# Patient Record
Sex: Female | Born: 1955
Health system: Southern US, Community
[De-identification: ages and names within clinical notes are randomized; demographics above are authoritative.]

## PROBLEM LIST (undated history)

## (undated) DIAGNOSIS — I83813 Varicose veins of bilateral lower extremities with pain: Secondary | ICD-10-CM

## (undated) DIAGNOSIS — C801 Malignant (primary) neoplasm, unspecified: Secondary | ICD-10-CM

## (undated) DIAGNOSIS — L4050 Arthropathic psoriasis, unspecified: Secondary | ICD-10-CM

## (undated) DIAGNOSIS — E7800 Pure hypercholesterolemia, unspecified: Secondary | ICD-10-CM

## (undated) DIAGNOSIS — Z87898 Personal history of other specified conditions: Secondary | ICD-10-CM

## (undated) DIAGNOSIS — M797 Fibromyalgia: Secondary | ICD-10-CM

## (undated) DIAGNOSIS — R42 Dizziness and giddiness: Secondary | ICD-10-CM

## (undated) DIAGNOSIS — M810 Age-related osteoporosis without current pathological fracture: Secondary | ICD-10-CM

## (undated) DIAGNOSIS — T7840XA Allergy, unspecified, initial encounter: Secondary | ICD-10-CM

## (undated) DIAGNOSIS — K219 Gastro-esophageal reflux disease without esophagitis: Secondary | ICD-10-CM

## (undated) DIAGNOSIS — K5790 Diverticulosis of intestine, part unspecified, without perforation or abscess without bleeding: Secondary | ICD-10-CM

## (undated) DIAGNOSIS — K589 Irritable bowel syndrome without diarrhea: Secondary | ICD-10-CM

## (undated) DIAGNOSIS — I10 Essential (primary) hypertension: Secondary | ICD-10-CM

## (undated) DIAGNOSIS — F419 Anxiety disorder, unspecified: Secondary | ICD-10-CM

## (undated) DIAGNOSIS — M1990 Unspecified osteoarthritis, unspecified site: Secondary | ICD-10-CM

## (undated) DIAGNOSIS — E559 Vitamin D deficiency, unspecified: Secondary | ICD-10-CM

## (undated) DIAGNOSIS — J189 Pneumonia, unspecified organism: Secondary | ICD-10-CM

## (undated) DIAGNOSIS — E785 Hyperlipidemia, unspecified: Secondary | ICD-10-CM

## (undated) DIAGNOSIS — J40 Bronchitis, not specified as acute or chronic: Secondary | ICD-10-CM

## (undated) DIAGNOSIS — I872 Venous insufficiency (chronic) (peripheral): Secondary | ICD-10-CM

## (undated) HISTORY — DX: Allergy, unspecified, initial encounter: T78.40XA

## (undated) HISTORY — DX: Fibromyalgia: M79.7

## (undated) HISTORY — PX: VARICOSE VEIN SURGERY: SHX832

## (undated) HISTORY — PX: DILATION AND CURETTAGE OF UTERUS: SHX78

## (undated) HISTORY — DX: Unspecified osteoarthritis, unspecified site: M19.90

## (undated) HISTORY — PX: JOINT REPLACEMENT: SHX530

## (undated) HISTORY — DX: Malignant (primary) neoplasm, unspecified: C80.1

## (undated) HISTORY — PX: COLONOSCOPY: SHX174

---

## 1982-06-27 HISTORY — PX: TUBAL LIGATION: SHX77

## 1994-06-27 HISTORY — PX: ABDOMINAL HYSTERECTOMY: SHX81

## 2004-04-26 ENCOUNTER — Ambulatory Visit: Payer: Self-pay | Admitting: Unknown Physician Specialty

## 2008-04-17 ENCOUNTER — Ambulatory Visit: Payer: Self-pay | Admitting: Endocrinology

## 2008-06-05 ENCOUNTER — Encounter: Payer: Self-pay | Admitting: Family

## 2008-06-27 ENCOUNTER — Encounter: Payer: Self-pay | Admitting: Family

## 2009-06-27 HISTORY — PX: BREAST BIOPSY: SHX20

## 2009-08-05 ENCOUNTER — Ambulatory Visit: Payer: Self-pay | Admitting: Unknown Physician Specialty

## 2010-01-08 ENCOUNTER — Ambulatory Visit: Payer: Self-pay | Admitting: General Surgery

## 2010-03-18 ENCOUNTER — Ambulatory Visit: Payer: Self-pay | Admitting: General Surgery

## 2010-06-27 HISTORY — PX: HERNIA REPAIR: SHX51

## 2010-06-27 HISTORY — PX: UMBILICAL HERNIA REPAIR: SHX196

## 2010-08-30 ENCOUNTER — Ambulatory Visit: Payer: Self-pay | Admitting: General Surgery

## 2010-09-08 ENCOUNTER — Ambulatory Visit: Payer: Self-pay | Admitting: Unknown Physician Specialty

## 2011-04-11 ENCOUNTER — Ambulatory Visit: Payer: Self-pay | Admitting: General Surgery

## 2011-10-26 ENCOUNTER — Ambulatory Visit: Payer: Self-pay | Admitting: Unknown Physician Specialty

## 2013-01-10 ENCOUNTER — Encounter: Payer: Self-pay | Admitting: *Deleted

## 2014-04-28 ENCOUNTER — Encounter: Payer: Self-pay | Admitting: *Deleted

## 2014-07-25 ENCOUNTER — Ambulatory Visit: Payer: Self-pay | Admitting: Adult Health

## 2014-10-07 ENCOUNTER — Emergency Department
Admit: 2014-10-07 | Disposition: A | Discharge status: Home / Self Care | Payer: Self-pay | Admitting: Emergency Medicine

## 2014-10-07 LAB — COMPREHENSIVE METABOLIC PANEL
ALBUMIN: 4.4 g/dL
ALK PHOS: 78 U/L
Anion Gap: 8 (ref 7–16)
BUN: 18 mg/dL
Bilirubin,Total: 0.5 mg/dL
CALCIUM: 9 mg/dL
Chloride: 99 mmol/L — ABNORMAL LOW
Co2: 28 mmol/L
Creatinine: 0.86 mg/dL
EGFR (African American): >60
EGFR (Non-African Amer.): >60
Glucose: 94 mg/dL
POTASSIUM: 3.7 mmol/L
SGOT(AST): 24 U/L
SGPT (ALT): 20 U/L
SODIUM: 135 mmol/L
TOTAL PROTEIN: 8.1 g/dL

## 2014-10-07 LAB — CBC
HCT: 39.6 % (ref 35.0–47.0)
HGB: 13.5 g/dL (ref 12.0–16.0)
MCH: 29.6 pg (ref 26.0–34.0)
MCHC: 34 g/dL (ref 32.0–36.0)
MCV: 87 fL (ref 80–100)
Platelet: 387 10*3/uL (ref 150–440)
RBC: 4.55 10*6/uL (ref 3.80–5.20)
RDW: 13.2 % (ref 11.5–14.5)
WBC: 8 10*3/uL (ref 3.6–11.0)

## 2014-10-07 LAB — TROPONIN I: Troponin-I: <0.03 ng/mL

## 2015-01-07 ENCOUNTER — Emergency Department
Admission: EM | Admit: 2015-01-07 | Discharge: 2015-01-07 | Disposition: A | Discharge status: Home / Self Care | Payer: 59 | Attending: Emergency Medicine | Admitting: Emergency Medicine

## 2015-01-07 ENCOUNTER — Emergency Department: Payer: 59

## 2015-01-07 ENCOUNTER — Encounter: Payer: Self-pay | Admitting: *Deleted

## 2015-01-07 DIAGNOSIS — R05 Cough: Secondary | ICD-10-CM | POA: Diagnosis present

## 2015-01-07 DIAGNOSIS — J209 Acute bronchitis, unspecified: Secondary | ICD-10-CM | POA: Diagnosis not present

## 2015-01-07 DIAGNOSIS — J40 Bronchitis, not specified as acute or chronic: Secondary | ICD-10-CM

## 2015-01-07 MED ORDER — IPRATROPIUM-ALBUTEROL 0.5-2.5 (3) MG/3ML IN SOLN
3.0000 mL | Freq: Once | RESPIRATORY_TRACT | Status: AC
  Administered 2015-01-07: 3 mL via RESPIRATORY_TRACT
  Filled 2015-01-07: qty 3

## 2015-01-07 MED ORDER — ALBUTEROL SULFATE HFA 108 (90 BASE) MCG/ACT IN AERS: 2.0000 | INHALATION_SPRAY | Freq: Four times a day (QID) | RESPIRATORY_TRACT | Status: DC | PRN

## 2015-01-07 MED ORDER — AZITHROMYCIN 250 MG PO TABS: ORAL_TABLET | ORAL | Status: AC

## 2015-01-07 MED ORDER — PREDNISONE 50 MG PO TABS: ORAL_TABLET | ORAL | Status: DC

## 2015-01-07 MED ORDER — PREDNISONE 20 MG PO TABS
60.0000 mg | ORAL_TABLET | Freq: Once | ORAL | Status: AC
  Administered 2015-01-07: 60 mg via ORAL
  Filled 2015-01-07: qty 3

## 2015-01-07 MED ORDER — HYDROCOD POLST-CPM POLST ER 10-8 MG/5ML PO SUER: 5.0000 mL | Freq: Two times a day (BID) | ORAL | Status: DC

## 2015-01-07 MED ORDER — HYDROCOD POLST-CPM POLST ER 10-8 MG/5ML PO SUER
5.0000 mL | Freq: Once | ORAL | Status: AC
  Administered 2015-01-07: 5 mL via ORAL

## 2015-01-07 MED ORDER — HYDROCOD POLST-CPM POLST ER 10-8 MG/5ML PO SUER
ORAL | Status: AC
  Filled 2015-01-07: qty 5

## 2015-01-07 MED ORDER — AZITHROMYCIN 250 MG PO TABS
500.0000 mg | ORAL_TABLET | Freq: Once | ORAL | Status: AC
  Administered 2015-01-07: 500 mg via ORAL
  Filled 2015-01-07: qty 2

## 2015-01-07 NOTE — Discharge Instructions (Signed)

## 2015-01-07 NOTE — ED Notes (Signed)
Pt c/o cough, throat irritation and feels like she cannot catch her breath for 1 week

## 2015-01-07 NOTE — ED Provider Notes (Signed)
[facility] Emergency Department Provider Note     Time seen: ----------------------------------------- 7:33 PM on 01/07/2015 -----------------------------------------    I have reviewed the triage vital signs and the nursing notes.   HISTORY  Chief Complaint Cough    HPI Anna Mccarthy is a 59 y.o. female who presents ER with cough throat irritation for over one week. Patient states she feels that she can't catch her breath she's had persistent coughing which is not improving. Denies fevers chills or other complaints.   Past Medical History  Diagnosis Date  . Allergy   . Fibromyalgia   . Arthritis   . Cancer     skin    There are no active problems to display for this patient.   Past Surgical History  Procedure Laterality Date  . Tubal ligation  1984  . Abdominal hysterectomy  1996  . Hernia repair  2012  . Colonoscopy      Allergies Percocet and Sulfa antibiotics  Social History History  Substance Use Topics  . Smoking status: Never Smoker   . Smokeless tobacco: Never Used  . Alcohol Use: Yes    Review of Systems Constitutional: Negative for fever. Eyes: Negative for visual changes. ENT: Positive for throat irritation Cardiovascular: Negative for chest pain. Respiratory: Positive shortness of breath and cough Gastrointestinal: Negative for abdominal pain, vomiting and diarrhea. Genitourinary: Negative for dysuria. Musculoskeletal: Negative for back pain. Skin: Negative for rash. Neurological: Negative for headaches, focal weakness or numbness.  10-point ROS otherwise negative.  ____________________________________________   PHYSICAL EXAM:  VITAL SIGNS: ED Triage Vitals  Enc Vitals Group     BP 01/07/15 1927 153/103 mmHg     Pulse Rate 01/07/15 1927 89     Resp 01/07/15 1927 18     Temp 01/07/15 1927 98.3 F (36.8 C)     Temp Source 01/07/15 1927 Oral     SpO2 01/07/15 1927 100 %     Weight 01/07/15 1927 170  lb (77.111 kg)     Height 01/07/15 1927 5\' 2"  (1.575 m)     Head Cir --      Peak Flow --      Pain Score --      Pain Loc --      Pain Edu? --      Excl. in [place]? --     Constitutional: Alert and oriented. Well appearing and in no distress. Eyes: Conjunctivae are normal. PERRL. Normal extraocular movements. ENT   Head: Normocephalic and atraumatic.   Nose: No congestion/rhinnorhea.   Mouth/Throat: Mucous membranes are moist.   Neck: No stridor. Hematological/Lymphatic/Immunilogical: No cervical lymphadenopathy. Cardiovascular: Normal rate, regular rhythm. Normal and symmetric distal pulses are present in all extremities. No murmurs, rubs, or gallops. Respiratory: Normal respiratory effort without tachypnea nor retractions. Breath sounds are clear and equal bilaterally. No wheezes/rales/rhonchi. Gastrointestinal: Soft and nontender. No distention. No abdominal bruits. There is no CVA tenderness. Musculoskeletal: Nontender with normal range of motion in all extremities. No joint effusions.  No lower extremity tenderness nor edema. Neurologic:  Normal speech and language. No gross focal neurologic deficits are appreciated. Speech is normal. No gait instability. Skin:  Skin is warm, dry and intact. No rash noted. Psychiatric: Mood and affect are normal. Speech and behavior are normal. Patient exhibits appropriate insight and judgment.  ____________________________________________  ED COURSE:  Pertinent labs & imaging results that were available during my care of the patient were reviewed by me and considered in my medical  decision making (see chart for details). Patient given DuoNeb, prednisone and azithromycin. ____________________________________________   RADIOLOGY Images were viewed by me  Chest x-ray was performed FINDINGS: The heart size and mediastinal contours are within normal limits. Both lungs are clear. The visualized skeletal structures  are unremarkable.  IMPRESSION: No active disease. ____________________________________________  FINAL ASSESSMENT AND PLAN  Bronchitis  Plan: Patient with imaging as dictated above. We'll continue home with reading treatments, prednisone, Tussionex and finish a Z-Pak. She is advised to return for worsening or worrisome symptoms. Earleen Newport, MD   Earleen Newport, MD 01/07/15 2022

## 2015-01-08 NOTE — ED Notes (Signed)
Call received from [facility] Pharmacist to clarify prescription, information given from discharge instructions. MG

## 2015-02-21 ENCOUNTER — Telehealth: Payer: 59 | Admitting: Family

## 2015-02-21 DIAGNOSIS — R05 Cough: Secondary | ICD-10-CM | POA: Diagnosis not present

## 2015-02-21 DIAGNOSIS — R059 Cough, unspecified: Secondary | ICD-10-CM

## 2015-02-21 DIAGNOSIS — J209 Acute bronchitis, unspecified: Secondary | ICD-10-CM

## 2015-02-21 MED ORDER — BENZONATATE 100 MG PO CAPS
100.0000 mg | ORAL_CAPSULE | Freq: Three times a day (TID) | ORAL | Status: DC | PRN
Start: 2015-02-21 — End: 2015-05-24

## 2015-02-21 MED ORDER — AZITHROMYCIN 250 MG PO TABS: ORAL_TABLET | ORAL | Status: DC

## 2015-02-21 NOTE — Progress Notes (Signed)

## 2015-05-24 ENCOUNTER — Telehealth: Payer: 59 | Admitting: Physician Assistant

## 2015-05-24 DIAGNOSIS — R05 Cough: Secondary | ICD-10-CM | POA: Diagnosis not present

## 2015-05-24 DIAGNOSIS — J209 Acute bronchitis, unspecified: Secondary | ICD-10-CM

## 2015-05-24 DIAGNOSIS — R059 Cough, unspecified: Secondary | ICD-10-CM

## 2015-05-24 MED ORDER — BENZONATATE 100 MG PO CAPS: 100.0000 mg | ORAL_CAPSULE | Freq: Three times a day (TID) | ORAL | Status: DC | PRN

## 2015-05-24 MED ORDER — AZITHROMYCIN 250 MG PO TABS: ORAL_TABLET | ORAL | Status: DC

## 2015-05-24 NOTE — Progress Notes (Signed)

## 2015-07-17 ENCOUNTER — Encounter: Payer: Self-pay | Admitting: Physician Assistant

## 2015-07-17 ENCOUNTER — Ambulatory Visit: Payer: Self-pay | Admitting: Physician Assistant

## 2015-07-17 VITALS — BP 140/80 | HR 72 | Temp 98.3°F

## 2015-07-17 DIAGNOSIS — M25562 Pain in left knee: Secondary | ICD-10-CM

## 2015-07-17 DIAGNOSIS — J069 Acute upper respiratory infection, unspecified: Secondary | ICD-10-CM

## 2015-07-17 MED ORDER — CEFDINIR 300 MG PO CAPS: 300.0000 mg | ORAL_CAPSULE | Freq: Two times a day (BID) | ORAL | Status: DC

## 2015-07-17 MED ORDER — DICLOFENAC SODIUM 1 % TD GEL: 4.0000 g | Freq: Four times a day (QID) | TRANSDERMAL | Status: DC

## 2015-07-17 MED ORDER — METHYLPREDNISOLONE 4 MG PO TBPK: ORAL_TABLET | ORAL | Status: DC

## 2015-07-17 NOTE — Progress Notes (Signed)
S: pt has 2 complaints, 1. Left knee pain, had cortisone injection in November, got a little better but now knee is getting sore again, no known injury, some popping/grinding, increased pain when bears weight, hx of ?rheumatoid vs osteo arthritis, states she has a lot of inflammation in her body 2. C/o cough and congestion with yellow mucus, no fever, chills, cp/sob; sx for a week, taking otc mucinex, tussin and cough drops  O: vitals wnl, nad, tms dull, nasal mucosa red and swollen, throat injected, neck supple no lymph, lungs c t a, cv rrr, left knee tender at medial aspect, some grinding with extension, full rom, n/v intact  A: acute upper respiratory, acute left knee pain  P: omnicef 300mg  bid, medrol dose pack, voltaren gel, f/u with pcp/ortho

## 2015-08-13 DIAGNOSIS — F419 Anxiety disorder, unspecified: Secondary | ICD-10-CM | POA: Diagnosis not present

## 2015-08-13 DIAGNOSIS — M25562 Pain in left knee: Secondary | ICD-10-CM | POA: Diagnosis not present

## 2015-08-13 DIAGNOSIS — M797 Fibromyalgia: Secondary | ICD-10-CM | POA: Diagnosis not present

## 2015-08-13 DIAGNOSIS — M2550 Pain in unspecified joint: Secondary | ICD-10-CM | POA: Diagnosis not present

## 2015-08-13 DIAGNOSIS — M7989 Other specified soft tissue disorders: Secondary | ICD-10-CM | POA: Diagnosis not present

## 2015-10-22 DIAGNOSIS — Z79899 Other long term (current) drug therapy: Secondary | ICD-10-CM | POA: Diagnosis not present

## 2015-10-22 DIAGNOSIS — M797 Fibromyalgia: Secondary | ICD-10-CM | POA: Diagnosis not present

## 2015-10-29 DIAGNOSIS — Z79899 Other long term (current) drug therapy: Secondary | ICD-10-CM | POA: Diagnosis not present

## 2015-10-29 DIAGNOSIS — M797 Fibromyalgia: Secondary | ICD-10-CM | POA: Diagnosis not present

## 2015-11-25 DIAGNOSIS — M25562 Pain in left knee: Secondary | ICD-10-CM | POA: Diagnosis not present

## 2015-11-25 DIAGNOSIS — Z86010 Personal history of colonic polyps: Secondary | ICD-10-CM | POA: Diagnosis not present

## 2015-11-25 DIAGNOSIS — G8929 Other chronic pain: Secondary | ICD-10-CM | POA: Diagnosis not present

## 2015-12-09 ENCOUNTER — Other Ambulatory Visit: Payer: Self-pay | Admitting: Orthopedic Surgery

## 2015-12-09 DIAGNOSIS — M23312 Other meniscus derangements, anterior horn of medial meniscus, left knee: Secondary | ICD-10-CM

## 2015-12-09 DIAGNOSIS — M25562 Pain in left knee: Secondary | ICD-10-CM | POA: Diagnosis not present

## 2015-12-09 DIAGNOSIS — G8929 Other chronic pain: Secondary | ICD-10-CM | POA: Diagnosis not present

## 2015-12-24 DIAGNOSIS — Z9229 Personal history of other drug therapy: Secondary | ICD-10-CM | POA: Diagnosis not present

## 2015-12-24 DIAGNOSIS — M25562 Pain in left knee: Secondary | ICD-10-CM | POA: Diagnosis not present

## 2016-01-05 ENCOUNTER — Ambulatory Visit
Admission: RE | Admit: 2016-01-05 | Discharge: 2016-01-05 | Disposition: A | Discharge status: Home / Self Care | Payer: 59 | Source: Ambulatory Visit | Attending: Orthopedic Surgery | Admitting: Orthopedic Surgery

## 2016-01-05 DIAGNOSIS — M25462 Effusion, left knee: Secondary | ICD-10-CM | POA: Diagnosis not present

## 2016-01-05 DIAGNOSIS — S83242A Other tear of medial meniscus, current injury, left knee, initial encounter: Secondary | ICD-10-CM | POA: Diagnosis not present

## 2016-01-05 DIAGNOSIS — M25461 Effusion, right knee: Secondary | ICD-10-CM | POA: Insufficient documentation

## 2016-01-05 DIAGNOSIS — M659 Synovitis and tenosynovitis, unspecified: Secondary | ICD-10-CM | POA: Diagnosis not present

## 2016-01-05 DIAGNOSIS — X58XXXA Exposure to other specified factors, initial encounter: Secondary | ICD-10-CM | POA: Insufficient documentation

## 2016-01-05 DIAGNOSIS — M23312 Other meniscus derangements, anterior horn of medial meniscus, left knee: Secondary | ICD-10-CM | POA: Diagnosis present

## 2016-01-08 ENCOUNTER — Ambulatory Visit: Payer: 59

## 2016-01-20 DIAGNOSIS — S83232D Complex tear of medial meniscus, current injury, left knee, subsequent encounter: Secondary | ICD-10-CM | POA: Diagnosis not present

## 2016-02-08 ENCOUNTER — Other Ambulatory Visit: Payer: 59

## 2016-02-11 ENCOUNTER — Encounter
Admission: RE | Admit: 2016-02-11 | Discharge: 2016-02-11 | Disposition: A | Discharge status: Home / Self Care | Payer: 59 | Source: Ambulatory Visit | Attending: Orthopedic Surgery | Admitting: Orthopedic Surgery

## 2016-02-11 DIAGNOSIS — I10 Essential (primary) hypertension: Secondary | ICD-10-CM | POA: Diagnosis not present

## 2016-02-11 DIAGNOSIS — Z01810 Encounter for preprocedural cardiovascular examination: Secondary | ICD-10-CM | POA: Diagnosis not present

## 2016-02-11 DIAGNOSIS — R194 Change in bowel habit: Secondary | ICD-10-CM | POA: Diagnosis not present

## 2016-02-11 DIAGNOSIS — R197 Diarrhea, unspecified: Secondary | ICD-10-CM | POA: Diagnosis not present

## 2016-02-11 HISTORY — DX: Bronchitis, not specified as acute or chronic: J40

## 2016-02-11 HISTORY — DX: Gastro-esophageal reflux disease without esophagitis: K21.9

## 2016-02-11 HISTORY — DX: Essential (primary) hypertension: I10

## 2016-02-11 HISTORY — DX: Anxiety disorder, unspecified: F41.9

## 2016-02-11 NOTE — Patient Instructions (Signed)
  Your procedure is scheduled on:02/18/16 Thurs Report to Same Day Surgery 2nd floor medical mall To find out your arrival time please call [phone] between [place] on 02/16/16 Wed  Remember: Instructions that are not followed completely may result in serious medical risk, up to and including death, or upon the discretion of your surgeon and anesthesiologist your surgery may need to be rescheduled.    _x___ 1. Do not eat food or drink liquids after midnight. No gum chewing or hard candies.     __x__ 2. No Alcohol for 24 hours before or after surgery.   __x__3. No Smoking for 24 prior to surgery.   ____  4. Bring all medications with you on the day of surgery if instructed.    __x__ 5. Notify your doctor if there is any change in your medical condition     (cold, fever, infections).     Do not wear jewelry, make-up, hairpins, clips or nail polish.  Do not wear lotions, powders, or perfumes. You may wear deodorant.  Do not shave 48 hours prior to surgery. Men may shave face and neck.  Do not bring valuables to the hospital.    [facility] is not responsible for any belongings or valuables.               Contacts, dentures or bridgework may not be worn into surgery.  Leave your suitcase in the car. After surgery it may be brought to your room.  For patients admitted to the hospital, discharge time is determined by your treatment team.   Patients discharged the day of surgery will not be allowed to drive home.    Please read over the following fact sheets that you were given:   [facility] Preparing for Surgery and or MRSA Information   _x___ Take these medicines the morning of surgery with A SIP OF WATER:    1. omeprazole (PRILOSEC) 40 MG capsule  2.albuterol (PROVENTIL HFA;VENTOLIN HFA) 108 (90 BASE) MCG/ACT inhaler bring to hospital with you  3.  4.  5.  6.  ____ Fleet Enema (as directed)   _x___ Use CHG Soap or sage wipes as directed on instruction sheet   ____ Use  inhalers on the day of surgery and bring to hospital day of surgery  ____ Stop metformin 2 days prior to surgery    ____ Take 1/2 of usual insulin dose the night before surgery and none on the morning of           surgery.   ____ Stop aspirin or coumadin, or plavix  _x__ Stop Anti-inflammatories such as Advil, Aleve, Ibuprofen, Motrin, Naproxen,          Naprosyn, Goodies powders or aspirin products. Ok to take Tylenol.   _x___ Stop supplements until after surgery.  Stop fish oils and vitamin E today  ____ Bring C-Pap to the hospital.

## 2016-02-11 NOTE — Pre-Procedure Instructions (Signed)
Narrative   CARDIOLOGY DEPARTMENT Anna Mccarthy, Anna Mccarthy #: [id number] [address] [name], [place] [address] Date: 10/28/2014 09:35 AM  Adult Female Age: 60 yrs  ECHOCARDIOGRAM REPORT Outpatient STUDY:Stress EchoTAPE: KC::KCWC  ECHO:Yes DOPPLER:YesFILE:[id number] MD1: COLOR:YesCONTRAST:NoMACHINE:PhilipsHeight: 78 in RV BIOPSY:No 3D:No SOUND QLTY:Moderate Weight: 173 lb  MEDIUM:None  BSA: 1.8 m2 ___________________________________________________________________________________________  HISTORY:Chest pain REASON:Assess, LV function Indication:R07.9 Chest pain with low risk for cardiac etiology, R06.02 SOB (shortness  of breath) on exertion  ___________________________________________________________________________________________ STRESS ECHOCARDIOGRAPHY   Protocol:Treadmill (Bruce) Drugs:None Target Heart Rate:136 bpmMaximum Predicted Heart Rate: 160 bpm  +-------------------+-------------------------+-------------------------+------------+  Stage  Duration (mm:ss) Heart Rate (bpm) BP  +-------------------+-------------------------+-------------------------+------------+ RESTING 73  136/88  +-------------------+-------------------------+-------------------------+------------+ EXERCISE  9:00  144  / +-------------------+-------------------------+-------------------------+------------+ RECOVERY  6:2290  195/98  +-------------------+-------------------------+-------------------------+------------+  Stress Duration:9:00 mm:ss Max Stress H.R.:144 bpmTarget Heart Rate Achieved: Yes   ___________________________________________________________________________________________ WALL SEGMENT CHANGES  RestStress Anterior Septum Basal:NormalHyperkinetic [id number]  Apical:NormalHyperkinetic  Anterior Wall Basal:NormalHyperkinetic [id number]  Apical:NormalHyperkinetic   Lateral Wall Basal:NormalHyperkinetic [id number]  Apical:NormalHyperkinetic   Posterior Wall Basal:NormalHyperkinetic [id number]  Inferior Wall Basal:NormalHyperkinetic [id number]  Apical:NormalHyperkinetic  Inferior Septum Basal:NormalHyperkinetic [id number]   Resting EF:>55% (Est.) Stress EF: >55% (Est.)   ___________________________________________________________________________________________ ADDITIONAL FINDINGS  Chest Discomfort:None Arrhythmia:None Termination Reason:Fatigue  Adverse Effects:None  Complications:None   ___________________________________________________________________________________________ STRESS ECG RESULTS   ECG  Results:Normal  ___________________________________________________________________________________________  ECHOCARDIOGRAPHIC DESCRIPTIONS  LEFT VENTRICLE Size:Normal  Contraction:Normal  LV Masses:No Masses  [id number] Dias.FxClass:Normal  RIGHT VENTRICLE Size:Normal Free Wall:Normal  Contraction:Normal RV Masses:No mass  PERICARDIUM  Fluid:No effusion  _______________________________________________________________________________________  DOPPLER ECHO and OTHER SPECIAL PROCEDURES  Aortic:No ARNo AS   Mitral:No MRNo MS MV Inflow E Vel=nm*MV Annulus E'Vel=nm* E/E'Ratio=nm*  Tricuspid:TRIVIAL TR No TS  Pulmonary:TRIVIAL PR No PS   ___________________________________________________________________________________________  ECHOCARDIOGRAPHIC MEASUREMENTS 2D DIMENSIONS AORTA ValuesNormal RangeMAIN PAValuesNormal Range Annulus:nm* [2.1 - 2.5]PA Main:nm* [1.5 - 2.1] Aorta Sin:nm* [2.7 - 3.3] RIGHT VENTRICLE ST Junction:nm* [2.3 - 2.9]RV Base:nm* [ < 4.2] Asc.Aorta:nm* [2.3 - 3.1] RV Mid:nm* [ < 3.5]  LEFT VENTRICLERV Length:nm* [ < 8.6] LVIDd:nm* [3.9 - 5.3] INFERIOR VENA CAVA LVIDs:nm* Max. IVC:nm* [ <= 2.1]  FS:nm* [> 25]Min. IVC:nm* SWT:nm* [0.5 - 0.9] ------------------ PWT:nm* [0.5 - 0.9] nm* - not measured  LEFT ATRIUM LA Diam:nm* [2.7 - 3.8] LA A4C Area:nm* [ <  20] LA Volume:nm* [22 - [id number]  ___________________________________________________________________________________________ INTERPRETATION Normal Stress Echocardiogram NORMAL RIGHT VENTRICULAR SYSTOLIC FUNCTION TRIVIAL REGURGITATION NOTED (See above) NO VALVULAR STENOSIS NOTED   ___________________________________________________________________________________________  Electronically signed by: MD Miquel Dunn on 10/28/2014 02:27 PM Performed By: Johnathan Hausen, RDCS, RVT Ordering Physician: Isaias Cowman ___________________________________________________________________________________________  Status Results Details

## 2016-02-16 NOTE — Pre-Procedure Instructions (Signed)
EKG/REQUEST FOR DR PARASCHOS TO CLEAR AS INSTRUCTED BY DR [place] TO DR Bergan [facility]. LM FOR CINDY

## 2016-02-16 NOTE — Pre-Procedure Instructions (Signed)
EKG sent to Anesthesia for review. 

## 2016-02-18 ENCOUNTER — Encounter: Payer: Self-pay | Admitting: *Deleted

## 2016-02-18 ENCOUNTER — Ambulatory Visit: Payer: 59 | Admitting: Certified Registered"

## 2016-02-18 ENCOUNTER — Ambulatory Visit
Admission: RE | Admit: 2016-02-18 | Discharge: 2016-02-18 | Disposition: A | Discharge status: Home / Self Care | Payer: 59 | Source: Ambulatory Visit | Attending: Orthopedic Surgery | Admitting: Orthopedic Surgery

## 2016-02-18 ENCOUNTER — Encounter
Admission: RE | Disposition: A | Discharge status: Home / Self Care | Payer: Self-pay | Source: Ambulatory Visit | Attending: Orthopedic Surgery

## 2016-02-18 DIAGNOSIS — E669 Obesity, unspecified: Secondary | ICD-10-CM | POA: Diagnosis not present

## 2016-02-18 DIAGNOSIS — M797 Fibromyalgia: Secondary | ICD-10-CM | POA: Insufficient documentation

## 2016-02-18 DIAGNOSIS — K589 Irritable bowel syndrome without diarrhea: Secondary | ICD-10-CM | POA: Insufficient documentation

## 2016-02-18 DIAGNOSIS — M810 Age-related osteoporosis without current pathological fracture: Secondary | ICD-10-CM | POA: Diagnosis not present

## 2016-02-18 DIAGNOSIS — Z8261 Family history of arthritis: Secondary | ICD-10-CM | POA: Insufficient documentation

## 2016-02-18 DIAGNOSIS — Z809 Family history of malignant neoplasm, unspecified: Secondary | ICD-10-CM | POA: Insufficient documentation

## 2016-02-18 DIAGNOSIS — Z840 Family history of diseases of the skin and subcutaneous tissue: Secondary | ICD-10-CM | POA: Diagnosis not present

## 2016-02-18 DIAGNOSIS — F419 Anxiety disorder, unspecified: Secondary | ICD-10-CM | POA: Insufficient documentation

## 2016-02-18 DIAGNOSIS — L408 Other psoriasis: Secondary | ICD-10-CM | POA: Diagnosis not present

## 2016-02-18 DIAGNOSIS — S83242A Other tear of medial meniscus, current injury, left knee, initial encounter: Secondary | ICD-10-CM | POA: Diagnosis present

## 2016-02-18 DIAGNOSIS — Z885 Allergy status to narcotic agent status: Secondary | ICD-10-CM | POA: Insufficient documentation

## 2016-02-18 DIAGNOSIS — Z90710 Acquired absence of both cervix and uterus: Secondary | ICD-10-CM | POA: Diagnosis not present

## 2016-02-18 DIAGNOSIS — M23312 Other meniscus derangements, anterior horn of medial meniscus, left knee: Secondary | ICD-10-CM | POA: Diagnosis not present

## 2016-02-18 DIAGNOSIS — M1990 Unspecified osteoarthritis, unspecified site: Secondary | ICD-10-CM | POA: Diagnosis not present

## 2016-02-18 DIAGNOSIS — Z882 Allergy status to sulfonamides status: Secondary | ICD-10-CM | POA: Insufficient documentation

## 2016-02-18 DIAGNOSIS — K219 Gastro-esophageal reflux disease without esophagitis: Secondary | ICD-10-CM | POA: Insufficient documentation

## 2016-02-18 DIAGNOSIS — Z8249 Family history of ischemic heart disease and other diseases of the circulatory system: Secondary | ICD-10-CM | POA: Insufficient documentation

## 2016-02-18 DIAGNOSIS — X58XXXD Exposure to other specified factors, subsequent encounter: Secondary | ICD-10-CM | POA: Insufficient documentation

## 2016-02-18 DIAGNOSIS — S83232D Complex tear of medial meniscus, current injury, left knee, subsequent encounter: Secondary | ICD-10-CM | POA: Diagnosis not present

## 2016-02-18 DIAGNOSIS — M2550 Pain in unspecified joint: Secondary | ICD-10-CM | POA: Insufficient documentation

## 2016-02-18 DIAGNOSIS — I10 Essential (primary) hypertension: Secondary | ICD-10-CM | POA: Insufficient documentation

## 2016-02-18 HISTORY — PX: KNEE ARTHROSCOPY WITH MEDIAL MENISECTOMY: SHX5651

## 2016-02-18 SURGERY — ARTHROSCOPY, KNEE, WITH MEDIAL MENISCECTOMY
Anesthesia: General | Site: Knee | Laterality: Left | Wound class: Clean

## 2016-02-18 MED ORDER — PROPOFOL 10 MG/ML IV BOLUS
INTRAVENOUS | Status: DC | PRN
  Administered 2016-02-18: 150 mg via INTRAVENOUS

## 2016-02-18 MED ORDER — ONDANSETRON HCL 4 MG/2ML IJ SOLN
INTRAMUSCULAR | Status: DC | PRN
  Administered 2016-02-18: 4 mg via INTRAVENOUS

## 2016-02-18 MED ORDER — PROMETHAZINE HCL 25 MG/ML IJ SOLN: 6.2500 mg | INTRAMUSCULAR | Status: DC | PRN

## 2016-02-18 MED ORDER — METOCLOPRAMIDE HCL 5 MG/ML IJ SOLN: 5.0000 mg | Freq: Three times a day (TID) | INTRAMUSCULAR | Status: DC | PRN

## 2016-02-18 MED ORDER — BUPIVACAINE-EPINEPHRINE (PF) 0.5% -1:200000 IJ SOLN
INTRAMUSCULAR | Status: AC
  Filled 2016-02-18: qty 30

## 2016-02-18 MED ORDER — BUPIVACAINE-EPINEPHRINE (PF) 0.5% -1:200000 IJ SOLN
INTRAMUSCULAR | Status: DC | PRN
Start: 2016-02-18 — End: 2016-02-18
  Administered 2016-02-18: 20 mL

## 2016-02-18 MED ORDER — HYDROCODONE-ACETAMINOPHEN 5-325 MG PO TABS: 1.0000 | ORAL_TABLET | ORAL | Status: DC | PRN

## 2016-02-18 MED ORDER — DEXAMETHASONE SODIUM PHOSPHATE 10 MG/ML IJ SOLN
INTRAMUSCULAR | Status: DC | PRN
  Administered 2016-02-18: 4 mg via INTRAVENOUS

## 2016-02-18 MED ORDER — HYDROCODONE-ACETAMINOPHEN 5-325 MG PO TABS: 1.0000 | ORAL_TABLET | Freq: Four times a day (QID) | ORAL | 0 refills | Status: DC | PRN

## 2016-02-18 MED ORDER — ONDANSETRON HCL 4 MG PO TABS: 4.0000 mg | ORAL_TABLET | Freq: Four times a day (QID) | ORAL | Status: DC | PRN

## 2016-02-18 MED ORDER — EPHEDRINE SULFATE 50 MG/ML IJ SOLN
INTRAMUSCULAR | Status: DC | PRN
  Administered 2016-02-18: 5 mg via INTRAVENOUS

## 2016-02-18 MED ORDER — LIDOCAINE HCL (CARDIAC) 20 MG/ML IV SOLN
INTRAVENOUS | Status: DC | PRN
  Administered 2016-02-18: 60 mg via INTRAVENOUS

## 2016-02-18 MED ORDER — FENTANYL CITRATE (PF) 100 MCG/2ML IJ SOLN
INTRAMUSCULAR | Status: DC | PRN
  Administered 2016-02-18 (×2): 50 ug via INTRAVENOUS

## 2016-02-18 MED ORDER — FENTANYL CITRATE (PF) 100 MCG/2ML IJ SOLN
INTRAMUSCULAR | Status: AC
  Filled 2016-02-18: qty 2

## 2016-02-18 MED ORDER — MEPERIDINE HCL 25 MG/ML IJ SOLN: 6.2500 mg | INTRAMUSCULAR | Status: DC | PRN

## 2016-02-18 MED ORDER — SODIUM CHLORIDE 0.9 % IV SOLN: INTRAVENOUS | Status: DC

## 2016-02-18 MED ORDER — METOCLOPRAMIDE HCL 10 MG PO TABS: 5.0000 mg | ORAL_TABLET | Freq: Three times a day (TID) | ORAL | Status: DC | PRN

## 2016-02-18 MED ORDER — FENTANYL CITRATE (PF) 100 MCG/2ML IJ SOLN
25.0000 ug | INTRAMUSCULAR | Status: DC | PRN
  Administered 2016-02-18 (×4): 25 ug via INTRAVENOUS
  Administered 2016-02-18: 50 ug via INTRAVENOUS

## 2016-02-18 MED ORDER — ONDANSETRON HCL 4 MG/2ML IJ SOLN: 4.0000 mg | Freq: Four times a day (QID) | INTRAMUSCULAR | Status: DC | PRN

## 2016-02-18 MED ORDER — FENTANYL CITRATE (PF) 100 MCG/2ML IJ SOLN
INTRAMUSCULAR | Status: AC
  Administered 2016-02-18: 25 ug via INTRAVENOUS
  Filled 2016-02-18: qty 2

## 2016-02-18 MED ORDER — LACTATED RINGERS IV SOLN
INTRAVENOUS | Status: DC
  Administered 2016-02-18: 10:00:00 via INTRAVENOUS

## 2016-02-18 MED ORDER — FAMOTIDINE 20 MG PO TABS
ORAL_TABLET | ORAL | Status: AC
  Filled 2016-02-18: qty 1

## 2016-02-18 MED ORDER — FAMOTIDINE 20 MG PO TABS
20.0000 mg | ORAL_TABLET | Freq: Once | ORAL | Status: AC
  Administered 2016-02-18: 20 mg via ORAL

## 2016-02-18 SURGICAL SUPPLY — 28 items
BANDAGE ACE 4X5 VEL STRL LF (GAUZE/BANDAGES/DRESSINGS) ×2 IMPLANT
BANDAGE ELASTIC 4 LF NS (GAUZE/BANDAGES/DRESSINGS) ×2 IMPLANT
BLADE FULL RADIUS 3.5 (BLADE) IMPLANT
BLADE INCISOR PLUS 4.5 (BLADE) IMPLANT
BLADE SHAVER 4.5 DBL SERAT CV (CUTTER) IMPLANT
BLADE SHAVER 4.5X7 STR FR (MISCELLANEOUS) IMPLANT
CHLORAPREP W/TINT 26ML (MISCELLANEOUS) ×2 IMPLANT
CUFF TOURN 24 STER (MISCELLANEOUS) IMPLANT
CUFF TOURN 30 STER DUAL PORT (MISCELLANEOUS) ×2 IMPLANT
DRAPE C-ARM XRAY 36X54 (DRAPES) ×2 IMPLANT
GAUZE SPONGE 4X4 12PLY STRL (GAUZE/BANDAGES/DRESSINGS) ×2 IMPLANT
GLOVE SURG ORTHO 9.0 STRL STRW (GLOVE) ×2 IMPLANT
GOWN SRG 2XL LVL 4 RGLN SLV (GOWNS) ×1 IMPLANT
GOWN STRL NON-REIN 2XL LVL4 (GOWNS) ×1
GOWN STRL REUS W/ TWL LRG LVL3 (GOWN DISPOSABLE) ×1 IMPLANT
GOWN STRL REUS W/TWL LRG LVL3 (GOWN DISPOSABLE) ×1
IV LACTATED RINGER IRRG 3000ML (IV SOLUTION) ×2
IV LR IRRIG 3000ML ARTHROMATIC (IV SOLUTION) ×2 IMPLANT
KIT RM TURNOVER STRD PROC AR (KITS) ×2 IMPLANT
MANIFOLD NEPTUNE II (INSTRUMENTS) ×2 IMPLANT
PACK ARTHROSCOPY KNEE (MISCELLANEOUS) ×2 IMPLANT
SET TUBE SUCT SHAVER OUTFL 24K (TUBING) ×2 IMPLANT
SET TUBE TIP INTRA-ARTICULAR (MISCELLANEOUS) ×2 IMPLANT
SUT ETHILON 4-0 (SUTURE) ×1
SUT ETHILON 4-0 FS2 18XMFL BLK (SUTURE) ×1
SUTURE ETHLN 4-0 FS2 18XMF BLK (SUTURE) ×1 IMPLANT
TUBING ARTHRO INFLOW-ONLY STRL (TUBING) ×2 IMPLANT
WAND HAND CNTRL MULTIVAC 50 (MISCELLANEOUS) ×2 IMPLANT

## 2016-02-18 NOTE — OR Nursing (Signed)
Left foot warm to touch cap refill < 3 secs patient able to move toes.

## 2016-02-18 NOTE — H&P (Signed)
Reviewed paper H+P, will be scanned into chart. No changes noted.  

## 2016-02-18 NOTE — OR Nursing (Signed)
Patient states pain is tolerable and desires to go home.  Patient state that she can take norco with no issues.

## 2016-02-18 NOTE — Anesthesia Procedure Notes (Signed)
Procedure Name: LMA Insertion Performed by: Skipper Dacosta Pre-anesthesia Checklist: Patient identified, Patient being monitored, Timeout performed, Emergency Drugs available and Suction available Patient Re-evaluated:Patient Re-evaluated prior to inductionOxygen Delivery Method: Circle system utilized Preoxygenation: Pre-oxygenation with 100% oxygen Intubation Type: IV induction Ventilation: Mask ventilation without difficulty LMA: LMA inserted LMA Size: 4.0 Tube type: Oral Number of attempts: 1 Placement Confirmation: positive ETCO2 and breath sounds checked- equal and bilateral Tube secured with: Tape Dental Injury: Teeth and Oropharynx as per pre-operative assessment        

## 2016-02-18 NOTE — Op Note (Signed)
02/18/2016  11:17 AM  PATIENT:  Anna Mccarthy  60 y.o. female  PRE-OPERATIVE DIAGNOSIS:  COMPLEX TEAR OF MEDIAL MENISCUS OF LEFT KNEE AS CURRENT INJURY SUBSEQUENT ENCOUNTER  POST-OPERATIVE DIAGNOSIS:  COMPLEX TEAR OF MEDIAL MENISCUS OF LEFT KNEE AS CURRENT INJURY SUBSEQUENT ENCOUNTER  PROCEDURE:  Procedure(s): KNEE ARTHROSCOPY WITH PARTIAL MEDIAL MENISECTOMY (Left)  SURGEON: Laurene Footman, MD  ASSISTANTS: None  ANESTHESIA:   general  EBL:  Total I/O In: 500 [I.V.:500] Out: 10 [Blood:10]  BLOOD ADMINISTERED:none  DRAINS: none   LOCAL MEDICATIONS USED:  MARCAINE     SPECIMEN:  No Specimen  DISPOSITION OF SPECIMEN:  N/A  COUNTS:  YES  TOURNIQUET:    IMPLANTS: None  DICTATION: .Dragon Dictation patient brought the operating room and after adequate anesthesia was obtained the left leg is prepped draped in sterile fashion with a retrobulbar leg holder and tourniquet applied. After patient identification and timeout procedures were completed, an inferior lateral portal was made. Initial inspection revealed moderate degenerative changes on the patella and trochlea with central arthritis present moderate synovitis present in the suprapatellar pouch along the medial gutter. Coming around medially and inferior medial portal was made and there was areas of near full-thickness cartilage loss to the medial femoral condyle and but without bone exposed the posterior horn of the meniscus had a complex tear with vertical and horizontal components which was debrided back to a stable margin resecting most the posterior third. A shaver and ArthriCare wand were additionally use as well as meniscal punch to debride this tear. The anterior cruciate ligament was intact and lateral compartment was normal. [place] was used to debride some of the synovitis anteriorly and along the gutters for better visualization. After thorough irrigation of the joint argentation was withdrawn and the wounds closed with  simple interrupted 4-0 nylon skin suture. 10 cc of half percent Sensorcaine was injected into each of the portals for postop analgesia. Xeroform 4 x 4 web roll and Ace wrap applied  PLAN OF CARE: Discharge to home after PACU  PATIENT DISPOSITION:  PACU - hemodynamically stable.

## 2016-02-18 NOTE — Anesthesia Preprocedure Evaluation (Signed)
Anesthesia Evaluation  Patient identified by MRN, date of birth, ID band Patient awake    Reviewed: Allergy & Precautions, NPO status , Patient's Chart, lab work & pertinent test results  History of Anesthesia Complications Negative for: history of anesthetic complications  Airway Mallampati: II  TM Distance: >3 FB Neck ROM: Full    Dental no notable dental hx.    Pulmonary neg sleep apnea, neg COPD,  Uses inhaler only with URIs   breath sounds clear to auscultation- rhonchi (-) wheezing      Cardiovascular Exercise Tolerance: Good hypertension, Pt. on medications (-) CAD and (-) Past MI  Rhythm:Regular Rate:Normal - Systolic murmurs and - Diastolic murmurs    Neuro/Psych Anxiety negative neurological ROS     GI/Hepatic Neg liver ROS, GERD  ,  Endo/Other  negative endocrine ROSneg diabetes  Renal/GU negative Renal ROS     Musculoskeletal  (+) Arthritis , Fibromyalgia -  Abdominal (+) + obese,   Peds  Hematology negative hematology ROS (+)   Anesthesia Other Findings Past Medical History: No date: Allergy No date: Anxiety No date: Arthritis No date: Bronchitis No date: Cancer ([place])     Comment: skin No date: Fibromyalgia No date: GERD (gastroesophageal reflux disease) No date: Hypertension   Reproductive/Obstetrics                             Anesthesia Physical Anesthesia Plan  ASA: II  Anesthesia Plan: General   Post-op Pain Management:    Induction: Intravenous  Airway Management Planned: LMA  Additional Equipment:   Intra-op Plan:   Post-operative Plan:   Informed Consent: I have reviewed the patients History and Physical, chart, labs and discussed the procedure including the risks, benefits and alternatives for the proposed anesthesia with the patient or authorized representative who has indicated his/her understanding and acceptance.   Dental advisory  given  Plan Discussed with: CRNA and Anesthesiologist  Anesthesia Plan Comments:         Anesthesia Quick Evaluation

## 2016-02-18 NOTE — Transfer of Care (Signed)
Immediate Anesthesia Transfer of Care Note  Patient: Anna Mccarthy  Procedure(s) Performed: Procedure(s): KNEE ARTHROSCOPY WITH PARTIAL MEDIAL MENISECTOMY (Left)  Patient Location: PACU  Anesthesia Type:General  Level of Consciousness: awake and responds to stimulation  Airway & Oxygen Therapy: Patient Spontanous Breathing and Patient connected to face mask oxygen  Post-op Assessment: Report given to RN and Post -op Vital signs reviewed and stable  Post vital signs: Reviewed and stable  Last Vitals:  Vitals:   02/18/16 0938 02/18/16 1118  BP: 131/84 (!) 165/85  Pulse: 65 92  Resp: 16 16  Temp: 36.8 C 36.2 C    Last Pain:  Vitals:   02/18/16 0938  TempSrc: Oral  PainSc: 6          Complications: No apparent anesthesia complications

## 2016-02-18 NOTE — Discharge Instructions (Signed)
Keep dressing clean and dry. The dressing slides down the leg remove entire bandage put 2 Band-Aids on and rewrap Ace wrap only. Aspirin 81 mg or 325 mg daily until walking normally. Weightbearing as tolerated on left leg but minimize activities as weekend   AMBULATORY SURGERY  DISCHARGE INSTRUCTIONS   1) The drugs that you were given will stay in your system until tomorrow so for the next 24 hours you should not:  A) Drive an automobile B) Make any legal decisions C) Drink any alcoholic beverage   2) You may resume regular meals tomorrow.  Today it is better to start with liquids and gradually work up to solid foods.  You may eat anything you prefer, but it is better to start with liquids, then soup and crackers, and gradually work up to solid foods.   3) Please notify your doctor immediately if you have any unusual bleeding, trouble breathing, redness and pain at the surgery site, drainage, fever, or pain not relieved by medication.    4) Additional Instructions:        Please contact your physician with any problems or Same Day Surgery at [phone], Monday through Friday 6 am to 4 pm, or [place] at [facility] number at [phone].

## 2016-02-18 NOTE — Anesthesia Postprocedure Evaluation (Signed)
Anesthesia Post Note  Patient: Anna Mccarthy  Procedure(s) Performed: Procedure(s) (LRB): KNEE ARTHROSCOPY WITH PARTIAL MEDIAL MENISECTOMY (Left)  Patient location during evaluation: PACU Anesthesia Type: General Level of consciousness: awake and alert and oriented Pain management: pain level controlled Vital Signs Assessment: post-procedure vital signs reviewed and stable Respiratory status: spontaneous breathing, nonlabored ventilation and respiratory function stable Cardiovascular status: blood pressure returned to baseline and stable Postop Assessment: no signs of nausea or vomiting Anesthetic complications: no    Last Vitals:  Vitals:   02/18/16 1200 02/18/16 1212  BP: (!) 156/92 (!) 162/77  Pulse: 71 66  Resp: 14 16  Temp: 36.9 C (!) 35.7 C    Last Pain:  Vitals:   02/18/16 1212  TempSrc: Tympanic  PainSc: 5                  Leman Martinek

## 2016-03-21 DIAGNOSIS — M1712 Unilateral primary osteoarthritis, left knee: Secondary | ICD-10-CM | POA: Diagnosis not present

## 2016-05-16 ENCOUNTER — Ambulatory Visit (INDEPENDENT_AMBULATORY_CARE_PROVIDER_SITE_OTHER): Payer: 59 | Admitting: Vascular Surgery

## 2016-05-16 DIAGNOSIS — I872 Venous insufficiency (chronic) (peripheral): Secondary | ICD-10-CM | POA: Diagnosis not present

## 2016-05-16 DIAGNOSIS — M7989 Other specified soft tissue disorders: Secondary | ICD-10-CM | POA: Diagnosis not present

## 2016-05-16 DIAGNOSIS — M1712 Unilateral primary osteoarthritis, left knee: Secondary | ICD-10-CM

## 2016-05-16 DIAGNOSIS — M79605 Pain in left leg: Secondary | ICD-10-CM

## 2016-05-16 DIAGNOSIS — M79604 Pain in right leg: Secondary | ICD-10-CM

## 2016-05-16 DIAGNOSIS — I83813 Varicose veins of bilateral lower extremities with pain: Secondary | ICD-10-CM | POA: Insufficient documentation

## 2016-05-16 DIAGNOSIS — M1990 Unspecified osteoarthritis, unspecified site: Secondary | ICD-10-CM | POA: Insufficient documentation

## 2016-05-16 DIAGNOSIS — M79609 Pain in unspecified limb: Secondary | ICD-10-CM | POA: Insufficient documentation

## 2016-05-16 NOTE — Progress Notes (Signed)
MRN : [id number]  Anna Mccarthy is a 60 y.o. (1956-01-30) female who presents with chief complaint of  Chief Complaint  Patient presents with  . New Evaluation    Varicose Veins  .  History of Present Illness: The patient is seen for evaluation of symptomatic varicose veins. The patient relates burning and stinging which worsened steadily throughout the course of the day, particularly with standing. The patient also notes an aching and throbbing pain over the varicosities, particularly with prolonged dependent positions. The symptoms are significantly improved with elevation.  The patient also notes that during hot weather the symptoms are greatly intensified. The patient states the pain from the varicose veins interferes with work, daily exercise, shopping and household maintenance. At this point, the symptoms are persistent and severe enough that they're having a negative impact on lifestyle and are interfering with daily activities.  There is no history of DVT, PE or superficial thrombophlebitis. There is no history of ulceration or hemorrhage. The patient denies a significant family history of varicose veins.  The patient has not worn graduated compression in the past. At the present time the patient has not been using over-the-counter analgesics. There is no history of prior surgical intervention or sclerotherapy.    Current Meds  Medication Sig  . albuterol (PROVENTIL HFA;VENTOLIN HFA) 108 (90 BASE) MCG/ACT inhaler Inhale 2 puffs into the lungs every 6 (six) hours as needed for wheezing or shortness of breath.  . ALPRAZolam (XANAX) 0.5 MG tablet Take 0.5 mg by mouth at bedtime.   Marland Kitchen b complex vitamins tablet Take 1 tablet by mouth daily.  . celecoxib (CELEBREX) 100 MG capsule Take 100 mg by mouth daily.   Marland Kitchen escitalopram (LEXAPRO) 20 MG tablet Take 20 mg by mouth at bedtime.   . hydrochlorothiazide (HYDRODIURIL) 25 MG tablet Take 25 mg by mouth daily.  Marland Kitchen HYDROcodone-acetaminophen  (NORCO) 5-325 MG tablet Take 1 tablet by mouth every 6 (six) hours as needed for moderate pain.  . Omega-3 Fatty Acids (FISH OIL PO) Take by mouth.  . Omega-3 Fatty Acids (FISH OIL) 1000 MG CAPS Take 1,000 mg by mouth at bedtime.   Marland Kitchen omeprazole (PRILOSEC) 40 MG capsule Take 40 mg by mouth daily.  . traMADol (ULTRAM) 50 MG tablet Take by mouth.  . Turmeric 1053 MG TABS Take by mouth.  . vitamin E 100 UNIT capsule Take 100 Units by mouth at bedtime.     Past Medical History:  Diagnosis Date  . Allergy   . Anxiety   . Arthritis   . Bronchitis   . Cancer ([place])    skin  . Fibromyalgia   . GERD (gastroesophageal reflux disease)   . Hypertension     Past Surgical History:  Procedure Laterality Date  . ABDOMINAL HYSTERECTOMY  1996  . COLONOSCOPY    . HERNIA REPAIR  2012  . KNEE ARTHROSCOPY WITH MEDIAL MENISECTOMY Left 02/18/2016   Procedure: KNEE ARTHROSCOPY WITH PARTIAL MEDIAL MENISECTOMY;  Surgeon: Hessie Knows, MD;  Location: [facility] ORS;  Service: Orthopedics;  Laterality: Left;  . TUBAL LIGATION  1984    Social History Social History  Substance Use Topics  . Smoking status: Never Smoker  . Smokeless tobacco: Never Used  . Alcohol use 1.2 oz/week    2 Glasses of wine per week    Family History Family History  Problem Relation Age of Onset  . Breast cancer Maternal Aunt   . Ovarian cancer Sister   . Breast cancer  Cousin   No family history of bleeding/clotting disorders, porphyria or autoimmune disease   Allergies  Allergen Reactions  . Percocet [Oxycodone-Acetaminophen] Itching  . Sulfa Antibiotics     Gi problems      REVIEW OF SYSTEMS (Negative unless checked)  Constitutional: [] Weight loss  [] Fever  [] Chills Cardiac: [] Chest pain   [] Chest pressure   [] Palpitations   [] Shortness of breath when laying flat   [] Shortness of breath with exertion. Vascular:  [x] Pain in legs with walking   [] Pain in legs at rest  [] History of DVT   [] Phlebitis   [x] Swelling in  legs   [x] Varicose veins   [] Non-healing ulcers Pulmonary:   [] Uses home oxygen   [] Productive cough   [] Hemoptysis   [] Wheeze  [] COPD   [] Asthma Neurologic:  [] Dizziness   [] Seizures   [] History of stroke   [] History of TIA  [] Aphasia   [] Vissual changes   [] Weakness or numbness in arm   [] Weakness or numbness in leg Musculoskeletal:   [] Joint swelling   [] Joint pain   [] Low back pain Hematologic:  [] Easy bruising  [] Easy bleeding   [] Hypercoagulable state   [] Anemic Gastrointestinal:  [] Diarrhea   [] Vomiting  [] Gastroesophageal reflux/heartburn   [] Difficulty swallowing. Genitourinary:  [] Chronic kidney disease   [] Difficult urination  [] Frequent urination   [] Blood in urine Skin:  [] Rashes   [] Ulcers  Psychological:  [] History of anxiety   []  History of major depression.  Physical Examination  Vitals:   05/16/16 0838  BP: (!) 147/90  Pulse: 62  Resp: 15  Weight: 179 lb (81.2 kg)  Height: 5\' 2"  (1.575 m)   Body mass index is 32.74 kg/m. Gen: WD/WN, NAD Head: [place]/AT, No temporalis wasting.  Ear/Nose/Throat: Hearing grossly intact, nares w/o erythema or drainage, poor dentition Eyes: PER, EOMI, sclera nonicteric.  Neck: Supple, no masses.  No bruit or JVD.  Pulmonary:  Good air movement, clear to auscultation bilaterally, no use of accessory muscles.  Cardiac: RRR, normal S1, S2, no Murmurs. Vascular: Varicosities >5 mm bilaterally, 1-2+ edmea bilaterally, mild venosus pigmentation Vessel Right Left  Radial Palpable Palpable  Ulnar Palpable Palpable  Brachial Palpable Palpable  Carotid Palpable Palpable  Femoral Palpable Palpable  Popliteal Palpable Palpable  PT Palpable Palpable  DP Palpable Palpable   Gastrointestinal: soft, non-distended. No guarding/no peritoneal signs.  Musculoskeletal: M/S 5/5 throughout.  No deformity or atrophy.  Neurologic: CN 2-12 intact. Pain and light touch intact in extremities.  Symmetrical.  Speech is fluent. Motor exam as listed  above. Psychiatric: Judgment intact, Mood & affect appropriate for pt's clinical situation. Dermatologic: No rashes or ulcers noted.  No changes consistent with cellulitis. Lymph : No Cervical lymphadenopathy, no lichenification or skin changes of chronic lymphedema.  CBC Lab Results  Component Value Date   WBC 8.0 10/07/2014   HGB 13.5 10/07/2014   HCT 39.6 10/07/2014   MCV 87 10/07/2014   PLT 387 10/07/2014    BMET    Component Value Date/Time   NA 135 10/07/2014 1837   K 3.7 10/07/2014 1837   CL 99 (L) 10/07/2014 1837   CO2 28 10/07/2014 1837   GLUCOSE 94 10/07/2014 1837   BUN 18 10/07/2014 1837   CREATININE 0.86 10/07/2014 1837   CALCIUM 9.0 10/07/2014 1837   GFRNONAA >60 10/07/2014 1837   GFRAA >60 10/07/2014 1837   CrCl cannot be calculated (Patient's most recent lab result is older than the maximum 21 days allowed.).  COAG No results found for: INR, PROTIME  Radiology No results found.  Assessment/Plan 1. Varicose veins of bilateral lower extremities with pain  Recommend:  The patient has large symptomatic varicose veins that are painful and associated with swelling.  I have had a long discussion with the patient regarding  varicose veins and why they cause symptoms.  Patient will begin wearing graduated compression stockings class 1 on a daily basis, beginning first thing in the morning and removing them in the evening. The patient is instructed specifically not to sleep in the stockings.    The patient  will also begin using over-the-counter analgesics such as Motrin 600 mg po TID to help control the symptoms.    In addition, behavioral modification including elevation during the day will be initiated.    Pending the results of these changes the  patient will be reevaluated in three months.   An  ultrasound of the venous system will be obtained.   Further plans will be based on the ultrasound results and whether conservative therapies are successful at  eliminating the pain and swelling.  - VAS [place] LOWER EXTREMITY VENOUS REFLUX; Future  2. Chronic venous insufficiency Continue compression  3. Pain in both lower extremities See #1  4. Swelling of limb Compression and elevation  5. Primary osteoarthritis of left knee Continue NSAID    Hortencia Pilar, MD  05/16/2016 9:08 AM

## 2016-05-17 ENCOUNTER — Ambulatory Visit (INDEPENDENT_AMBULATORY_CARE_PROVIDER_SITE_OTHER): Payer: 59

## 2016-05-17 ENCOUNTER — Encounter (INDEPENDENT_AMBULATORY_CARE_PROVIDER_SITE_OTHER): Payer: Self-pay | Admitting: Vascular Surgery

## 2016-05-17 ENCOUNTER — Ambulatory Visit (INDEPENDENT_AMBULATORY_CARE_PROVIDER_SITE_OTHER): Payer: 59 | Admitting: Vascular Surgery

## 2016-05-17 VITALS — BP 135/92 | HR 70 | Resp 16 | Ht 62.0 in | Wt 180.0 lb

## 2016-05-17 DIAGNOSIS — I872 Venous insufficiency (chronic) (peripheral): Secondary | ICD-10-CM

## 2016-05-17 DIAGNOSIS — M79605 Pain in left leg: Secondary | ICD-10-CM

## 2016-05-17 DIAGNOSIS — I83813 Varicose veins of bilateral lower extremities with pain: Secondary | ICD-10-CM

## 2016-05-17 DIAGNOSIS — M79604 Pain in right leg: Secondary | ICD-10-CM | POA: Diagnosis not present

## 2016-05-17 NOTE — Progress Notes (Signed)
Subjective:    Patient ID: Anna Mccarthy, female    DOB: 06-26-1956, 60 y.o.   MRN: [id number] Chief Complaint  Patient presents with  . Re-evaluation    Ultrasound follow up   Patient presents to review vascular studies. She was last seen 05/16/16 for evaluation of symptomatic varicose veins. The patient is s/p laser ablation of the right GSV in 2014. The patient relates burning and stinging along her bilateral varicose veins which worsened steadily throughout the course of the day, particularly with standing. The patient also notes an aching and throbbing pain over the varicosities, particularly with prolonged dependent positions. The patient also notes that during hot weather the symptoms are greatly intensified. The patient states the pain from the varicose veins interferes with work, daily exercise, shopping and household maintenance. At this point, the symptoms are persistent and severe enough that they're having a negative impact on lifestyle and are interfering with daily activities. The patient has been wearing medical grade one compression stockings prescribed by her rheumatologist for over three months. I have seen these stockings today. She also engages in elevating her legs and remaining active on a daily basis with minimal relief requiring the use of OTC anti-inflammatories. The patient underwent a bilateral venous duplex which was notable for RIGHT: ablated midportion of the GSV, reflux in branches of the SSV, no DVT / SVT - LEFT: Reflux in the GSV and branches of the GSV, No DVT, No SVT.   Review of Systems  Constitutional: Negative.   HENT: Negative.   Eyes: Negative.   Respiratory: Negative.   Cardiovascular: Positive for leg swelling.  Gastrointestinal: Negative.   Endocrine: Negative.   Genitourinary: Negative.   Musculoskeletal: Negative.   Skin:       Varicose Veins  Allergic/Immunologic: Negative.   Neurological: Negative.   Hematological: Negative.     Psychiatric/Behavioral: Negative.        Objective:   Physical Exam  Gen: WD/WN, NAD Head: /AT, No temporalis wasting.  Ear/Nose/Throat: Hearing grossly intact, nares w/o erythema or drainage, poor dentition Eyes: PER, EOMI, sclera nonicteric.  Neck: Supple, no masses.  No bruit or JVD.  Pulmonary:  Good air movement, clear to auscultation bilaterally, no use of accessory muscles.  Cardiac: RRR, normal S1, S2, no Murmurs. Vascular: Varicosities >5 mm bilaterally, 1-2+ edmea bilaterally, mild venosus pigmentation Vessel Right Left  Radial Palpable Palpable  Ulnar Palpable Palpable  Brachial Palpable Palpable  Carotid Palpable Palpable  Femoral Palpable Palpable  Popliteal Palpable Palpable  PT Palpable Palpable  DP Palpable Palpable   Gastrointestinal: soft, non-distended. No guarding/no peritoneal signs.  Musculoskeletal: M/S 5/5 throughout.  No deformity or atrophy.  Neurologic: CN 2-12 intact. Pain and light touch intact in extremities.  Symmetrical.  Speech is fluent. Motor exam as listed above. Psychiatric: Judgment intact, Mood & affect appropriate for pt's clinical situation. Dermatologic: No rashes or ulcers noted.  No changes consistent with cellulitis. Lymph : No Cervical lymphadenopathy, no lichenification or skin changes of chronic lymphedema.  BP (!) 135/92 (BP Location: Right Arm)   Pulse 70   Resp 16   Ht 5\' 2"  (1.575 m)   Wt 180 lb (81.6 kg)   BMI 32.92 kg/m   Past Medical History:  Diagnosis Date  . Allergy   . Anxiety   . Arthritis   . Bronchitis   . Cancer ([place])    skin  . Fibromyalgia   . GERD (gastroesophageal reflux disease)   . Hypertension  Social History   Social History  . Marital status: Married    Spouse name: N/A  . Number of children: N/A  . Years of education: N/A   Occupational History  . Not on file.   Social History Main Topics  . Smoking status: Never Smoker  . Smokeless tobacco: Never Used  . Alcohol use  1.2 oz/week    2 Glasses of wine per week  . Drug use: No  . Sexual activity: Not on file   Other Topics Concern  . Not on file   Social History Narrative  . No narrative on file    Past Surgical History:  Procedure Laterality Date  . ABDOMINAL HYSTERECTOMY  1996  . COLONOSCOPY    . HERNIA REPAIR  2012  . KNEE ARTHROSCOPY WITH MEDIAL MENISECTOMY Left 02/18/2016   Procedure: KNEE ARTHROSCOPY WITH PARTIAL MEDIAL MENISECTOMY;  Surgeon: Hessie Knows, MD;  Location: [facility] ORS;  Service: Orthopedics;  Laterality: Left;  . TUBAL LIGATION  1984    Family History  Problem Relation Age of Onset  . Breast cancer Maternal Aunt   . Ovarian cancer Sister   . Breast cancer Cousin     Allergies  Allergen Reactions  . Percocet [Oxycodone-Acetaminophen] Itching  . Sulfa Antibiotics     Gi problems        Assessment & Plan:  Patient presents to review vascular studies. She was last seen 05/16/16 for evaluation of symptomatic varicose veins. The patient is s/p laser ablation of the right GSV in 2014. The patient relates burning and stinging along her bilateral varicose veins which worsened steadily throughout the course of the day, particularly with standing. The patient also notes an aching and throbbing pain over the varicosities, particularly with prolonged dependent positions. The patient also notes that during hot weather the symptoms are greatly intensified. The patient states the pain from the varicose veins interferes with work, daily exercise, shopping and household maintenance. At this point, the symptoms are persistent and severe enough that they're having a negative impact on lifestyle and are interfering with daily activities. The patient has been wearing medical grade one compression stockings prescribed by her rheumatologist for over three months. I have seen these stockings today. She also engages in elevating her legs and remaining active on a daily basis with minimal relief  requiring the use of OTC anti-inflammatories. The patient underwent a bilateral venous duplex which was notable for RIGHT: ablated midportion of the GSV, reflux in branches of the SSV, no DVT / SVT - LEFT: Reflux in the GSV and branches of the GSV, No DVT, No SVT.  1. Chronic venous insufficiency - Worsening Patient has failed conservative therapy. The patient states the pain from the varicose veins interferes with work, daily exercise, shopping and household maintenance. At this point, the symptoms are persistent and severe enough that they're having a negative impact on lifestyle and are interfering with daily activities.  Recommend laser ablation of the left GSV. I have discussed the risks and benefits of the procedure. The risks primarily include DVT, recanalization, bleeding, infection, and inability to gain access.  2. Varicose veins of bilateral lower extremities with pain - Worsening Continue conservative therapy for now.  3. Pain in both lower extremities - Worsening The patient is likely to benefit from endovenous laser ablation. Continue conservative therapy for now.  Current Outpatient Prescriptions on File Prior to Visit  Medication Sig Dispense Refill  . albuterol (PROVENTIL HFA;VENTOLIN HFA) 108 (90 BASE) MCG/ACT inhaler Inhale  2 puffs into the lungs every 6 (six) hours as needed for wheezing or shortness of breath. 1 Inhaler 2  . ALPRAZolam (XANAX) 0.5 MG tablet Take 0.5 mg by mouth at bedtime.   2  . b complex vitamins tablet Take 1 tablet by mouth daily.    . celecoxib (CELEBREX) 100 MG capsule Take 100 mg by mouth daily.     Marland Kitchen escitalopram (LEXAPRO) 20 MG tablet Take 20 mg by mouth at bedtime.     . hydrochlorothiazide (HYDRODIURIL) 25 MG tablet Take 25 mg by mouth daily.    Marland Kitchen HYDROcodone-acetaminophen (NORCO) 5-325 MG tablet Take 1 tablet by mouth every 6 (six) hours as needed for moderate pain. 30 tablet 0  . Omega-3 Fatty Acids (FISH OIL PO) Take by mouth.    . Omega-3  Fatty Acids (FISH OIL) 1000 MG CAPS Take 1,000 mg by mouth at bedtime.     Marland Kitchen omeprazole (PRILOSEC) 40 MG capsule Take 40 mg by mouth daily.    . traMADol (ULTRAM) 50 MG tablet Take by mouth.    . Turmeric 1053 MG TABS Take by mouth.    . vitamin E 100 UNIT capsule Take 100 Units by mouth at bedtime.      No current facility-administered medications on file prior to visit.     There are no Patient Instructions on file for this visit. No Follow-up on file.   Brantley Naser A Gerrod Maule, PA-C

## 2016-05-30 ENCOUNTER — Encounter
Admission: RE | Disposition: A | Discharge status: Home / Self Care | Payer: Self-pay | Source: Ambulatory Visit | Attending: Gastroenterology

## 2016-05-30 ENCOUNTER — Ambulatory Visit: Payer: 59 | Admitting: Anesthesiology

## 2016-05-30 ENCOUNTER — Encounter: Payer: Self-pay | Admitting: *Deleted

## 2016-05-30 ENCOUNTER — Ambulatory Visit
Admission: RE | Admit: 2016-05-30 | Discharge: 2016-05-30 | Disposition: A | Discharge status: Home / Self Care | Payer: 59 | Source: Ambulatory Visit | Attending: Gastroenterology | Admitting: Gastroenterology

## 2016-05-30 DIAGNOSIS — R194 Change in bowel habit: Secondary | ICD-10-CM | POA: Insufficient documentation

## 2016-05-30 DIAGNOSIS — I10 Essential (primary) hypertension: Secondary | ICD-10-CM | POA: Insufficient documentation

## 2016-05-30 DIAGNOSIS — K5790 Diverticulosis of intestine, part unspecified, without perforation or abscess without bleeding: Secondary | ICD-10-CM | POA: Diagnosis not present

## 2016-05-30 DIAGNOSIS — Z85828 Personal history of other malignant neoplasm of skin: Secondary | ICD-10-CM | POA: Diagnosis not present

## 2016-05-30 DIAGNOSIS — M797 Fibromyalgia: Secondary | ICD-10-CM | POA: Insufficient documentation

## 2016-05-30 DIAGNOSIS — M1990 Unspecified osteoarthritis, unspecified site: Secondary | ICD-10-CM | POA: Diagnosis not present

## 2016-05-30 DIAGNOSIS — K5730 Diverticulosis of large intestine without perforation or abscess without bleeding: Secondary | ICD-10-CM | POA: Diagnosis not present

## 2016-05-30 DIAGNOSIS — K219 Gastro-esophageal reflux disease without esophagitis: Secondary | ICD-10-CM | POA: Insufficient documentation

## 2016-05-30 DIAGNOSIS — F419 Anxiety disorder, unspecified: Secondary | ICD-10-CM | POA: Diagnosis not present

## 2016-05-30 DIAGNOSIS — K529 Noninfective gastroenteritis and colitis, unspecified: Secondary | ICD-10-CM | POA: Insufficient documentation

## 2016-05-30 DIAGNOSIS — Z79899 Other long term (current) drug therapy: Secondary | ICD-10-CM | POA: Insufficient documentation

## 2016-05-30 DIAGNOSIS — R197 Diarrhea, unspecified: Secondary | ICD-10-CM | POA: Diagnosis not present

## 2016-05-30 HISTORY — PX: COLONOSCOPY WITH PROPOFOL: SHX5780

## 2016-05-30 SURGERY — COLONOSCOPY WITH PROPOFOL
Anesthesia: General

## 2016-05-30 MED ORDER — SODIUM CHLORIDE 0.9 % IV SOLN: INTRAVENOUS | Status: DC

## 2016-05-30 MED ORDER — PROPOFOL 500 MG/50ML IV EMUL
INTRAVENOUS | Status: DC | PRN
  Administered 2016-05-30: 140 ug/kg/min via INTRAVENOUS

## 2016-05-30 MED ORDER — PROPOFOL 10 MG/ML IV BOLUS
INTRAVENOUS | Status: DC | PRN
  Administered 2016-05-30: 20 mg via INTRAVENOUS
  Administered 2016-05-30: 60 mg via INTRAVENOUS

## 2016-05-30 MED ORDER — SODIUM CHLORIDE 0.9 % IV SOLN
INTRAVENOUS | Status: DC
  Administered 2016-05-30: 14:00:00 via INTRAVENOUS

## 2016-05-30 MED ORDER — SODIUM CHLORIDE 0.9 % IV SOLN
2.0000 g | Freq: Once | INTRAVENOUS | Status: AC
  Administered 2016-05-30: 14:00:00 via INTRAVENOUS
  Filled 2016-05-30: qty 2000

## 2016-05-30 NOTE — Anesthesia Preprocedure Evaluation (Signed)
Anesthesia Evaluation  Patient identified by MRN, date of birth, ID band Patient awake    Reviewed: Allergy & Precautions, NPO status , Patient's Chart, lab work & pertinent test results  History of Anesthesia Complications Negative for: history of anesthetic complications  Airway Mallampati: II       Dental   Pulmonary           Cardiovascular hypertension, Pt. on medications      Neuro/Psych    GI/Hepatic Neg liver ROS, GERD  Medicated and Controlled,  Endo/Other  negative endocrine ROS  Renal/GU negative Renal ROS     Musculoskeletal  (+) Arthritis , Osteoarthritis,  Fibromyalgia -  Abdominal   Peds  Hematology negative hematology ROS (+)   Anesthesia Other Findings   Reproductive/Obstetrics                             Anesthesia Physical Anesthesia Plan  ASA: II  Anesthesia Plan: General   Post-op Pain Management:    Induction: Intravenous  Airway Management Planned: Nasal Cannula  Additional Equipment:   Intra-op Plan:   Post-operative Plan:   Informed Consent: I have reviewed the patients History and Physical, chart, labs and discussed the procedure including the risks, benefits and alternatives for the proposed anesthesia with the patient or authorized representative who has indicated his/her understanding and acceptance.     Plan Discussed with:   Anesthesia Plan Comments:         Anesthesia Quick Evaluation

## 2016-05-30 NOTE — Anesthesia Postprocedure Evaluation (Signed)
Anesthesia Post Note  Patient: Anna Mccarthy  Procedure(s) Performed: Procedure(s) (LRB): COLONOSCOPY WITH PROPOFOL (N/A)  Patient location during evaluation: Endoscopy Anesthesia Type: General Level of consciousness: awake and alert Pain management: pain level controlled Vital Signs Assessment: post-procedure vital signs reviewed and stable Respiratory status: spontaneous breathing and respiratory function stable Cardiovascular status: stable Anesthetic complications: no    Last Vitals:  Vitals:   05/30/16 1458 05/30/16 1508  BP: (!) 111/58 (!) 129/56  Pulse: 61 (!) 54  Resp: 14 13  Temp: (!) 35.8 C     Last Pain:  Vitals:   05/30/16 1458  TempSrc: Tympanic                 KEPHART,WILLIAM K

## 2016-05-30 NOTE — Op Note (Signed)
[place] [facility] Gastroenterology Patient Name: Anna Mccarthy Procedure Date: 05/30/2016 2:06 PM MRN: [id number] Account #: [id number] Date of Birth: Jun 14, 1956 Admit Type: Outpatient Age: 60 Room: [facility] ENDO ROOM 1 Gender: Female Note Status: Finalized Procedure:            Colonoscopy Indications:          Chronic diarrhea, Change in bowel habits Providers:            Lollie Sails, MD Referring MD:         Irven Easterly. Kary Kos, MD (Referring MD) Medicines:            Monitored Anesthesia Care Complications:        No immediate complications. Procedure:            Pre-Anesthesia Assessment:                       - ASA Grade Assessment: II - A patient with mild                        systemic disease.                       After obtaining informed consent, the colonoscope was                        passed under direct vision. Throughout the procedure,                        the patient's blood pressure, pulse, and oxygen                        saturations were monitored continuously. The                        Colonoscope was introduced through the anus and                        advanced to the the cecum, identified by appendiceal                        orifice and ileocecal valve. The colonoscopy was                        performed with moderate difficulty. Successful                        completion of the procedure was aided by changing the                        patient to a supine position, changing the patient to a                        prone position and using manual pressure. The patient                        tolerated the procedure well. The quality of the bowel                        preparation was good. Findings:      Multiple small to medium, a few large diverticula  were found in the       sigmoid colon, descending colon and transverse colon.      The exam was otherwise normal throughout the examined colon.      The digital rectal exam was normal.      The retroflexed view of the distal rectum and anal verge was normal and       showed no anal or rectal abnormalities.      Biopsies for histology were taken with a cold forceps from the right       colon and left colon for evaluation of microscopic colitis. Impression:           - Diverticulosis in the sigmoid colon, in the                        descending colon and in the transverse colon.                       - The distal rectum and anal verge are normal on                        retroflexion view.                       - Biopsies were taken with a cold forceps from the                        right colon and left colon for evaluation of                        microscopic colitis. Recommendation:       - Discharge patient to home.                       - Await pathology results.                       - Return to GI clinic in 3 weeks. Procedure Code(s):    --- Professional ---                       [phone], Colonoscopy, flexible; with biopsy, single or                        multiple Diagnosis Code(s):    --- Professional ---                       K52.9, Noninfective gastroenteritis and colitis,                        unspecified                       R19.4, Change in bowel habit                       K57.30, Diverticulosis of large intestine without                        perforation or abscess without bleeding CPT copyright 2016 American [employer]. All rights reserved. The codes documented in this report are preliminary and upon coder review may  be revised to meet current compliance requirements. Hassell Done  Kassie Mends, MD 05/30/2016 2:57:18 PM This report has been signed electronically. Number of Addenda: 0 Note Initiated On: 05/30/2016 2:06 PM Scope Withdrawal Time: 0 hours 10 minutes 10 seconds  Total Procedure Duration: 0 hours 30 minutes 52 seconds       [facility]

## 2016-05-30 NOTE — H&P (Signed)
Outpatient short stay form Pre-procedure 05/30/2016 2:12 PM Anna Sails MD  Primary Physician: Dr. Maryland Mccarthy  Reason for visit:  Colonoscopy  History of present illness:  Patient is a 60 year old female who is presenting today as above. She has a history of having loose stools/diarrhea for at least a number of months perhaps up to a year. This happens on a on a near daily basis.    Current Facility-Administered Medications:  .  0.9 %  sodium chloride infusion, , Intravenous, Continuous, Anna Sails, MD, Last Rate: 20 mL/hr at 05/30/16 1345 .  0.9 %  sodium chloride infusion, , Intravenous, Continuous, Anna Sails, MD  Prescriptions Prior to Admission  Medication Sig Dispense Refill Last Dose  . albuterol (PROVENTIL HFA;VENTOLIN HFA) 108 (90 BASE) MCG/ACT inhaler Inhale 2 puffs into the lungs every 6 (six) hours as needed for wheezing or shortness of breath. 1 Inhaler 2 Past Week at Unknown time  . ALPRAZolam (XANAX) 0.5 MG tablet Take 0.5 mg by mouth at bedtime.   2 Past Week at Unknown time  . b complex vitamins tablet Take 1 tablet by mouth daily.   Past Week at Unknown time  . celecoxib (CELEBREX) 100 MG capsule Take 100 mg by mouth daily.    Past Week at Unknown time  . escitalopram (LEXAPRO) 20 MG tablet Take 20 mg by mouth at bedtime.    Past Week at Unknown time  . hydrochlorothiazide (HYDRODIURIL) 25 MG tablet Take 25 mg by mouth daily.   Past Week at Unknown time  . HYDROcodone-acetaminophen (NORCO) 5-325 MG tablet Take 1 tablet by mouth every 6 (six) hours as needed for moderate pain. 30 tablet 0 Past Week at Unknown time  . Omega-3 Fatty Acids (FISH OIL PO) Take by mouth.   Past Week at Unknown time  . Omega-3 Fatty Acids (FISH OIL) 1000 MG CAPS Take 1,000 mg by mouth at bedtime.    Past Week at Unknown time  . omeprazole (PRILOSEC) 40 MG capsule Take 40 mg by mouth daily.   Past Week at Unknown time  . traMADol (ULTRAM) 50 MG tablet Take by mouth.   Past  Week at Unknown time  . Turmeric 1053 MG TABS Take by mouth.   Past Week at Unknown time  . vitamin E 100 UNIT capsule Take 100 Units by mouth at bedtime.    Past Week at Unknown time     Allergies  Allergen Reactions  . Percocet [Oxycodone-Acetaminophen] Itching  . Sulfa Antibiotics     Gi problems      Past Medical History:  Diagnosis Date  . Allergy   . Anxiety   . Arthritis   . Bronchitis   . Cancer ([place])    skin  . Fibromyalgia   . GERD (gastroesophageal reflux disease)   . Hypertension     Review of systems:      Physical Exam    Heart and lungs: Regular rate and rhythm without rub or gallop, lungs are bilaterally clear.    HEENT: Normocephalic atraumatic eyes are anicteric    Other:     Pertinant exam for procedure: Soft nontender nondistended bowel sounds positive normoactive.    Planned proceedures: Colonoscopy and indicated procedures. I have discussed the risks benefits and complications of procedures to include not limited to bleeding, infection, perforation and the risk of sedation and the patient wishes to proceed.    Anna Sails, MD Gastroenterology 05/30/2016  2:12 PM

## 2016-05-30 NOTE — Transfer of Care (Signed)
Immediate Anesthesia Transfer of Care Note  Patient: Anna Mccarthy  Procedure(s) Performed: Procedure(s): COLONOSCOPY WITH PROPOFOL (N/A)  Patient Location: PACU  Anesthesia Type:General  Level of Consciousness: awake, alert  and oriented  Airway & Oxygen Therapy: Patient Spontanous Breathing and Patient connected to nasal cannula oxygen  Post-op Assessment: Report given to RN and Post -op Vital signs reviewed and stable  Post vital signs: Reviewed and stable  Last Vitals:  Vitals:   05/30/16 1331 05/30/16 1458  BP: (!) 156/94   Pulse: 73   Resp: 20   Temp: 36.8 C (!) 35.8 C    Last Pain:  Vitals:   05/30/16 1458  TempSrc: Tympanic         Complications: No apparent anesthesia complications

## 2016-05-30 NOTE — Anesthesia Procedure Notes (Signed)
Performed by: Demetrius Charity Pre-anesthesia Checklist: Patient identified, Emergency Drugs available, Suction available and Patient being monitored Patient Re-evaluated:Patient Re-evaluated prior to inductionOxygen Delivery Method: Nasal cannula Intubation Type: IV induction

## 2016-05-31 ENCOUNTER — Encounter: Payer: Self-pay | Admitting: Gastroenterology

## 2016-06-01 LAB — SURGICAL PATHOLOGY

## 2016-06-02 DIAGNOSIS — M1712 Unilateral primary osteoarthritis, left knee: Secondary | ICD-10-CM | POA: Diagnosis not present

## 2016-07-25 DIAGNOSIS — R197 Diarrhea, unspecified: Secondary | ICD-10-CM | POA: Diagnosis not present

## 2016-07-25 DIAGNOSIS — R1084 Generalized abdominal pain: Secondary | ICD-10-CM | POA: Diagnosis not present

## 2016-07-25 DIAGNOSIS — K219 Gastro-esophageal reflux disease without esophagitis: Secondary | ICD-10-CM | POA: Diagnosis not present

## 2016-08-17 DIAGNOSIS — M791 Myalgia: Secondary | ICD-10-CM | POA: Diagnosis not present

## 2016-08-17 DIAGNOSIS — Z79899 Other long term (current) drug therapy: Secondary | ICD-10-CM | POA: Diagnosis not present

## 2016-08-17 DIAGNOSIS — M2550 Pain in unspecified joint: Secondary | ICD-10-CM | POA: Diagnosis not present

## 2016-08-17 DIAGNOSIS — M1712 Unilateral primary osteoarthritis, left knee: Secondary | ICD-10-CM | POA: Diagnosis not present

## 2016-08-17 DIAGNOSIS — E559 Vitamin D deficiency, unspecified: Secondary | ICD-10-CM | POA: Diagnosis not present

## 2016-08-18 ENCOUNTER — Ambulatory Visit (INDEPENDENT_AMBULATORY_CARE_PROVIDER_SITE_OTHER): Payer: 59 | Admitting: Vascular Surgery

## 2016-08-18 ENCOUNTER — Encounter (INDEPENDENT_AMBULATORY_CARE_PROVIDER_SITE_OTHER): Payer: Self-pay | Admitting: Vascular Surgery

## 2016-08-18 VITALS — BP 144/91 | HR 62 | Resp 16 | Ht 65.0 in | Wt 176.0 lb

## 2016-08-18 DIAGNOSIS — M7989 Other specified soft tissue disorders: Secondary | ICD-10-CM

## 2016-08-18 DIAGNOSIS — M79605 Pain in left leg: Secondary | ICD-10-CM | POA: Diagnosis not present

## 2016-08-18 DIAGNOSIS — I83813 Varicose veins of bilateral lower extremities with pain: Secondary | ICD-10-CM | POA: Diagnosis not present

## 2016-08-18 DIAGNOSIS — I872 Venous insufficiency (chronic) (peripheral): Secondary | ICD-10-CM

## 2016-08-18 DIAGNOSIS — M79604 Pain in right leg: Secondary | ICD-10-CM | POA: Diagnosis not present

## 2016-08-22 NOTE — Progress Notes (Signed)
MRN : [id number]  Anna Mccarthy is a 61 y.o. (13-Jun-1956) female who presents with chief complaint of  Chief Complaint  Patient presents with  . Re-evaluation    3 month laser documentation  .  History of Present Illness: The patient returns for followup evaluation 3 months after the initial visit. The patient continues to have pain in the lower extremities with dependency. The pain is lessened with elevation. Graduated compression stockings, Class I (20-30 mmHg), have been worn but the stockings do not eliminate the leg pain. Over-the-counter analgesics do not improve the symptoms. The degree of discomfort continues to interfere with daily activities. The patient notes the pain in the legs is causing problems with daily exercise, at the workplace and even with household activities and maintenance such as standing in the kitchen preparing meals and doing dishes.   Venous ultrasound shows normal deep venous system, no evidence of acute or chronic DVT.  Superficial reflux is present in the left GSV  Current Meds  Medication Sig  . ALPRAZolam (XANAX) 0.5 MG tablet Take 0.5 mg by mouth at bedtime.   Marland Kitchen b complex vitamins tablet Take 1 tablet by mouth daily.  . celecoxib (CELEBREX) 100 MG capsule Take 100 mg by mouth daily.   Marland Kitchen escitalopram (LEXAPRO) 20 MG tablet Take 20 mg by mouth at bedtime.   . hydrochlorothiazide (HYDRODIURIL) 25 MG tablet Take 25 mg by mouth daily.  Marland Kitchen HYDROcodone-acetaminophen (NORCO) 5-325 MG tablet Take 1 tablet by mouth every 6 (six) hours as needed for moderate pain.  . hydroxychloroquine (PLAQUENIL) 200 MG tablet Take by mouth.  . Omega-3 Fatty Acids (FISH OIL PO) Take by mouth.  Marland Kitchen omeprazole (PRILOSEC) 40 MG capsule Take 40 mg by mouth daily.  Marland Kitchen tiZANidine (ZANAFLEX) 2 MG tablet Take by mouth.  . traMADol (ULTRAM) 50 MG tablet Take by mouth.  . Turmeric 1053 MG TABS Take by mouth.  . vitamin E 100 UNIT capsule Take 100 Units by mouth at bedtime.     Past  Medical History:  Diagnosis Date  . Allergy   . Anxiety   . Arthritis   . Bronchitis   . Cancer ([place])    skin  . Fibromyalgia   . GERD (gastroesophageal reflux disease)   . Hypertension     Past Surgical History:  Procedure Laterality Date  . ABDOMINAL HYSTERECTOMY  1996  . COLONOSCOPY    . COLONOSCOPY WITH PROPOFOL N/A 05/30/2016   Procedure: COLONOSCOPY WITH PROPOFOL;  Surgeon: Lollie Sails, MD;  Location: [facility] ENDOSCOPY;  Service: Endoscopy;  Laterality: N/A;  . HERNIA REPAIR  2012  . KNEE ARTHROSCOPY WITH MEDIAL MENISECTOMY Left 02/18/2016   Procedure: KNEE ARTHROSCOPY WITH PARTIAL MEDIAL MENISECTOMY;  Surgeon: Hessie Knows, MD;  Location: [facility] ORS;  Service: Orthopedics;  Laterality: Left;  . TUBAL LIGATION  1984    Social History Social History  Substance Use Topics  . Smoking status: Never Smoker  . Smokeless tobacco: Never Used  . Alcohol use 1.2 oz/week    2 Glasses of wine per week    Family History Family History  Problem Relation Age of Onset  . Breast cancer Maternal Aunt   . Ovarian cancer Sister   . Breast cancer Cousin   No family history of bleeding/clotting disorders, porphyria or autoimmune disease  Allergies  Allergen Reactions  . Percocet [Oxycodone-Acetaminophen] Itching  . Sulfa Antibiotics     Gi problems      REVIEW OF SYSTEMS (Negative unless  checked)  Constitutional: [] Weight loss  [] Fever  [] Chills Cardiac: [] Chest pain   [] Chest pressure   [] Palpitations   [] Shortness of breath when laying flat   [] Shortness of breath with exertion. Vascular:  [] Pain in legs with walking   [x] Pain in legs standing  [] History of DVT   [] Phlebitis   [x] Swelling in legs   [x] Varicose veins   [] Non-healing ulcers Pulmonary:   [] Uses home oxygen   [] Productive cough   [] Hemoptysis   [] Wheeze  [] COPD   [] Asthma Neurologic:  [] Dizziness   [] Seizures   [] History of stroke   [] History of TIA  [] Aphasia   [] Vissual changes   [] Weakness or numbness in arm    [] Weakness or numbness in leg Musculoskeletal:   [] Joint swelling   [] Joint pain   [] Low back pain Hematologic:  [] Easy bruising  [] Easy bleeding   [] Hypercoagulable state   [] Anemic Gastrointestinal:  [] Diarrhea   [] Vomiting  [] Gastroesophageal reflux/heartburn   [] Difficulty swallowing. Genitourinary:  [] Chronic kidney disease   [] Difficult urination  [] Frequent urination   [] Blood in urine Skin:  [] Rashes   [] Ulcers  Psychological:  [] History of anxiety   []  History of major depression.  Physical Examination  Vitals:   08/18/16 1654  BP: (!) 144/91  Pulse: 62  Resp: 16  Weight: 176 lb (79.8 kg)  Height: 5\' 5"  (1.651 m)   Body mass index is 29.29 kg/m. Gen: WD/WN, NAD Head: [place]/AT, No temporalis wasting.  Ear/Nose/Throat: Hearing grossly intact, nares w/o erythema or drainage, poor dentition Eyes: PER, EOMI, sclera nonicteric.  Neck: Supple, no masses.  No bruit or JVD.  Pulmonary:  Good air movement, clear to auscultation bilaterally, no use of accessory muscles.  Cardiac: RRR, normal S1, S2, no Murmurs. Vascular: Varicosities >5 mm bilaterally, 1-2+ edmea bilaterally, mild venosus pigmentation Vessel Right Left  Radial Palpable Palpable  Ulnar Palpable Palpable  Brachial Palpable Palpable  Carotid Palpable Palpable  Femoral Palpable Palpable  Popliteal Palpable Palpable  PT Palpable Palpable  DP Palpable Palpable   Gastrointestinal: soft, non-distended. No guarding/no peritoneal signs.  Musculoskeletal: M/S 5/5 throughout.  No deformity or atrophy.  Neurologic: CN 2-12 intact. Pain and light touch intact in extremities.  Symmetrical.  Speech is fluent. Motor exam as listed above. Psychiatric: Judgment intact, Mood & affect appropriate for pt's clinical situation. Dermatologic: No rashes or ulcers noted.  No changes consistent with cellulitis. Lymph : No Cervical lymphadenopathy, no lichenification or skin changes of chronic lymphedema.  CBC Lab Results  Component  Value Date   WBC 8.0 10/07/2014   HGB 13.5 10/07/2014   HCT 39.6 10/07/2014   MCV 87 10/07/2014   PLT 387 10/07/2014    BMET    Component Value Date/Time   NA 135 10/07/2014 1837   K 3.7 10/07/2014 1837   CL 99 (L) 10/07/2014 1837   CO2 28 10/07/2014 1837   GLUCOSE 94 10/07/2014 1837   BUN 18 10/07/2014 1837   CREATININE 0.86 10/07/2014 1837   CALCIUM 9.0 10/07/2014 1837   GFRNONAA >60 10/07/2014 1837   GFRAA >60 10/07/2014 1837   CrCl cannot be calculated (Patient's most recent lab result is older than the maximum 21 days allowed.).  COAG No results found for: INR, PROTIME  Radiology No results found.  Assessment/Plan 1. Varicose veins of bilateral lower extremities with pain Recommend  I have reviewed my previous  discussion with the patient regarding  varicose veins and why they cause symptoms. Patient will continue  wearing graduated compression stockings  class 1 on a daily basis, beginning first thing in the morning and removing them in the evening.    In addition, behavioral modification including elevation during the day was again discussed and this will continue.  The patient has utilized over the counter pain medications and has been exercising.  However, at this time conservative therapy has not alleviated the patient's symptoms of leg pain and swelling  Recommend: laser ablation of the left great saphenous vein to eliminate the symptoms of pain and swelling of the lower extremities caused by the severe superficial venous reflux disease.   2. Chronic venous insufficiency See #1  3. Pain in both lower extremities See #1  4. Swelling of limb continue compression     Hortencia Pilar, MD  08/22/2016 2:38 PM

## 2016-09-15 ENCOUNTER — Ambulatory Visit (INDEPENDENT_AMBULATORY_CARE_PROVIDER_SITE_OTHER): Payer: 59 | Admitting: Vascular Surgery

## 2016-09-15 ENCOUNTER — Encounter (INDEPENDENT_AMBULATORY_CARE_PROVIDER_SITE_OTHER): Payer: Self-pay | Admitting: Vascular Surgery

## 2016-09-15 VITALS — BP 146/89 | HR 56 | Resp 16 | Wt 175.0 lb

## 2016-09-15 DIAGNOSIS — I83813 Varicose veins of bilateral lower extremities with pain: Secondary | ICD-10-CM | POA: Diagnosis not present

## 2016-09-15 DIAGNOSIS — I872 Venous insufficiency (chronic) (peripheral): Secondary | ICD-10-CM

## 2016-09-15 NOTE — Progress Notes (Signed)
    MRN : [id number]  JAEL WALDORF is a 62 y.o. (May 26, 1956) female who presents with chief complaint of No chief complaint on file. .    The patient's left lower extremity was sterilely prepped and draped.  The ultrasound machine was used to visualize the left great saphenous vein throughout its course.  A segment at the knee was selected for access.  The saphenous vein was accessed without difficulty using ultrasound guidance with a micropuncture needle.   An 0.018  wire was placed beyond the saphenofemoral junction through the sheath and the microneedle was removed.  The 65 cm sheath was then placed over the wire and the wire and dilator were removed.  The laser fiber was placed through the sheath and its tip was placed approximately 2 cm below the saphenofemoral junction.  Tumescent anesthesia was then created with a dilute lidocaine solution.  Laser energy was then delivered with constant withdrawal of the sheath and laser fiber.  Approximately 1190 Joules of energy were delivered over a length of 27 cm.    Sterile dressings were placed.  The patient tolerated the procedure well without complications.

## 2016-09-21 ENCOUNTER — Ambulatory Visit (INDEPENDENT_AMBULATORY_CARE_PROVIDER_SITE_OTHER): Payer: 59

## 2016-09-21 DIAGNOSIS — I83813 Varicose veins of bilateral lower extremities with pain: Secondary | ICD-10-CM

## 2016-10-20 ENCOUNTER — Ambulatory Visit (INDEPENDENT_AMBULATORY_CARE_PROVIDER_SITE_OTHER): Payer: 59 | Admitting: Vascular Surgery

## 2016-10-20 VITALS — BP 153/93 | HR 73 | Resp 16 | Ht 62.5 in | Wt 175.0 lb

## 2016-10-20 DIAGNOSIS — M79604 Pain in right leg: Secondary | ICD-10-CM | POA: Diagnosis not present

## 2016-10-20 DIAGNOSIS — M79605 Pain in left leg: Secondary | ICD-10-CM | POA: Diagnosis not present

## 2016-10-20 DIAGNOSIS — I83813 Varicose veins of bilateral lower extremities with pain: Secondary | ICD-10-CM | POA: Diagnosis not present

## 2016-10-20 DIAGNOSIS — I872 Venous insufficiency (chronic) (peripheral): Secondary | ICD-10-CM

## 2016-10-20 DIAGNOSIS — M7989 Other specified soft tissue disorders: Secondary | ICD-10-CM | POA: Diagnosis not present

## 2016-10-20 NOTE — Progress Notes (Signed)
MRN : [id number]  Anna Mccarthy is a 61 y.o. (05-Nov-1955) female who presents with chief complaint of  Chief Complaint  Patient presents with  . Re-evaluation    Post laser  .  History of Present Illness: The patient returns to the office for followup status post laser ablation of the left great saphenous vein on 09/15/2016. The patient notes multiple residual varicosities bilaterally which continued to hurt with dependent positions and remained tender to palpation. The patient's swelling is unchanged from preoperative status. The patient continues to wear graduated compression stockings on a daily basis but these are not eliminating the pain and discomfort. The patient continues to use over-the-counter anti-inflammatory medications to treat the pain and related symptoms but this has not given the patient relief. The patient notes the pain in the lower extremities is causing problems with daily exercise, problems at work and even with household activities such as preparing meals and doing dishes.  The patient is otherwise done well and there have been no complications related to the laser procedure or interval changes in the patient's overall   Venous ultrasound post laser shows successful laser ablation of the left great saphenous vein, no DVT identified.  No outpatient prescriptions have been marked as taking for the 10/20/16 encounter (Office Visit) with Katha Cabal, MD.    Past Medical History:  Diagnosis Date  . Allergy   . Anxiety   . Arthritis   . Bronchitis   . Cancer ([place])    skin  . Fibromyalgia   . GERD (gastroesophageal reflux disease)   . Hypertension     Past Surgical History:  Procedure Laterality Date  . ABDOMINAL HYSTERECTOMY  1996  . COLONOSCOPY    . COLONOSCOPY WITH PROPOFOL N/A 05/30/2016   Procedure: COLONOSCOPY WITH PROPOFOL;  Surgeon: Lollie Sails, MD;  Location: [facility] ENDOSCOPY;  Service: Endoscopy;  Laterality: N/A;  . HERNIA REPAIR  2012  .  KNEE ARTHROSCOPY WITH MEDIAL MENISECTOMY Left 02/18/2016   Procedure: KNEE ARTHROSCOPY WITH PARTIAL MEDIAL MENISECTOMY;  Surgeon: Hessie Knows, MD;  Location: [facility] ORS;  Service: Orthopedics;  Laterality: Left;  . TUBAL LIGATION  1984    Social History Social History  Substance Use Topics  . Smoking status: Never Smoker  . Smokeless tobacco: Never Used  . Alcohol use 1.2 oz/week    2 Glasses of wine per week    Family History Family History  Problem Relation Age of Onset  . Breast cancer Maternal Aunt   . Ovarian cancer Sister   . Breast cancer Cousin     Allergies  Allergen Reactions  . Percocet [Oxycodone-Acetaminophen] Itching  . Sulfa Antibiotics     Gi problems      REVIEW OF SYSTEMS (Negative unless checked)  Constitutional: [] Weight loss  [] Fever  [] Chills Cardiac: [] Chest pain   [] Chest pressure   [] Palpitations   [] Shortness of breath when laying flat   [] Shortness of breath with exertion. Vascular:  [] Pain in legs with walking   [x] Pain in legs with standing  [] History of DVT   [] Phlebitis   [x] Swelling in legs   [x] Varicose veins   [] Non-healing ulcers Pulmonary:   [] Uses home oxygen   [] Productive cough   [] Hemoptysis   [] Wheeze  [] COPD   [] Asthma Neurologic:  [] Dizziness   [] Seizures   [] History of stroke   [] History of TIA  [] Aphasia   [] Vissual changes   [] Weakness or numbness in arm   [] Weakness or numbness in leg Musculoskeletal:   []   Joint swelling   [] Joint pain   [] Low back pain Hematologic:  [] Easy bruising  [] Easy bleeding   [] Hypercoagulable state   [] Anemic Gastrointestinal:  [] Diarrhea   [] Vomiting  [] Gastroesophageal reflux/heartburn   [] Difficulty swallowing. Genitourinary:  [] Chronic kidney disease   [] Difficult urination  [] Frequent urination   [] Blood in urine Skin:  [] Rashes   [] Ulcers  Psychological:  [] History of anxiety   []  History of major depression.  Physical Examination  Vitals:   10/20/16 1456  BP: (!) 153/93  Pulse: 73  Resp:  16  Weight: 79.4 kg (175 lb)  Height: 5' 2.5" (1.588 m)   Body mass index is 31.5 kg/m. Gen: WD/WN, NAD Head: [place]/AT, No temporalis wasting.  Ear/Nose/Throat: Hearing grossly intact, nares w/o erythema or drainage Eyes: PER, EOMI, sclera nonicteric.  Neck: Supple, no large masses.   Pulmonary:  Good air movement, no audible wheezing bilaterally, no use of accessory muscles.  Cardiac: RRR, no JVD Vascular: Large varicosities present extensively greater than 8 mm left lower extremity.  Mild venous stasis changes to the legs bilaterally.  2+ soft pitting edema Vessel Right Left  Radial Palpable Palpable  Ulnar Palpable Palpable  Brachial Palpable Palpable  Carotid Palpable Palpable  Femoral Palpable Palpable  Popliteal Palpable Palpable  PT Palpable Palpable  DP Palpable Palpable  Gastrointestinal: Non-distended. No guarding/no peritoneal signs.  Musculoskeletal: M/S 5/5 throughout.  No deformity or atrophy.  Neurologic: CN 2-12 intact. Symmetrical.  Speech is fluent. Motor exam as listed above. Psychiatric: Judgment intact, Mood & affect appropriate for pt's clinical situation. Dermatologic: No rashes or ulcers noted.  No changes consistent with cellulitis. Lymph : No lichenification or skin changes of chronic lymphedema.  CBC Lab Results  Component Value Date   WBC 8.0 10/07/2014   HGB 13.5 10/07/2014   HCT 39.6 10/07/2014   MCV 87 10/07/2014   PLT 387 10/07/2014    BMET    Component Value Date/Time   NA 135 10/07/2014 1837   K 3.7 10/07/2014 1837   CL 99 (L) 10/07/2014 1837   CO2 28 10/07/2014 1837   GLUCOSE 94 10/07/2014 1837   BUN 18 10/07/2014 1837   CREATININE 0.86 10/07/2014 1837   CALCIUM 9.0 10/07/2014 1837   GFRNONAA >60 10/07/2014 1837   GFRAA >60 10/07/2014 1837   CrCl cannot be calculated (Patient's most recent lab result is older than the maximum 21 days allowed.).  COAG No results found for: INR, PROTIME  Radiology No results  found.  Assessment/Plan 1. Varicose veins of bilateral lower extremities with pain Recommend:  The patient has had successful ablation of the previously incompetent saphenous venous system but still has persistent symptoms of pain and swelling that are having a negative impact on daily life and daily activities.  Patient should undergo injection sclerotherapy to treat the residual varicosities.  The risks, benefits and alternative therapies were reviewed in detail with the patient.  All questions were answered.  The patient agrees to proceed with sclerotherapy at their convenience.  The patient will continue wearing the graduated compression stockings and using the over-the-counter pain medications to treat her symptoms.    2. Pain in both lower extremities Recommend:  The patient has had successful ablation of the previously incompetent saphenous venous system but still has persistent symptoms of pain and swelling that are having a negative impact on daily life and daily activities.  Patient should undergo injection sclerotherapy to treat the residual varicosities.  The risks, benefits and alternative therapies were reviewed  in detail with the patient.  All questions were answered.  The patient agrees to proceed with sclerotherapy at their convenience.  The patient will continue wearing the graduated compression stockings and using the over-the-counter pain medications to treat her symptoms.    3. Chronic venous insufficiency No surgery or intervention at this point in time.    I have had a long discussion with the patient regarding venous insufficiency and why it  causes symptoms. I have discussed with the patient the chronic skin changes that accompany venous insufficiency and the long term sequela such as infection and ulceration.  Patient will begin wearing graduated compression stockings class 1 (20-30 mmHg) or compression wraps on a daily basis a prescription was given. The patient  will put the stockings on first thing in the morning and removing them in the evening. The patient is instructed specifically not to sleep in the stockings.    In addition, behavioral modification including several periods of elevation of the lower extremities during the day will be continued. I have demonstrated that proper elevation is a position with the ankles at heart level.  The patient is instructed to begin routine exercise, especially walking on a daily basis  Following the review of the ultrasound the patient will follow up in 2-3 months to reassess the degree of swelling and the control that graduated compression stockings or compression wraps  is offering.   The patient can be assessed for a Lymph Pump at that time  4. Swelling of limb See #3    Hortencia Pilar, MD  10/20/2016 4:12 PM

## 2017-01-05 ENCOUNTER — Ambulatory Visit (INDEPENDENT_AMBULATORY_CARE_PROVIDER_SITE_OTHER): Payer: 59 | Admitting: Vascular Surgery

## 2017-01-05 ENCOUNTER — Encounter (INDEPENDENT_AMBULATORY_CARE_PROVIDER_SITE_OTHER): Payer: Self-pay | Admitting: Vascular Surgery

## 2017-01-05 VITALS — BP 137/89 | HR 60 | Resp 16 | Wt 174.8 lb

## 2017-01-05 DIAGNOSIS — I83813 Varicose veins of bilateral lower extremities with pain: Secondary | ICD-10-CM

## 2017-01-05 NOTE — Progress Notes (Signed)
   Indication:  Patient presents with symptomatic varicose veins of the bilateral lower extremity.  Procedure:  Sclerotherapy using hypertonic saline mixed with 1% Lidocaine was performed on the bilateral lower extremity.  Compression wraps were placed.  The patient tolerated the procedure well.  Plan:  Follow up as needed.  

## 2017-02-02 ENCOUNTER — Encounter (INDEPENDENT_AMBULATORY_CARE_PROVIDER_SITE_OTHER): Payer: Self-pay | Admitting: Vascular Surgery

## 2017-02-02 ENCOUNTER — Ambulatory Visit (INDEPENDENT_AMBULATORY_CARE_PROVIDER_SITE_OTHER): Payer: 59 | Admitting: Vascular Surgery

## 2017-02-02 VITALS — BP 151/93 | HR 63 | Resp 15 | Ht 63.0 in | Wt 175.0 lb

## 2017-02-02 DIAGNOSIS — I83009 Varicose veins of unspecified lower extremity with ulcer of unspecified site: Secondary | ICD-10-CM | POA: Insufficient documentation

## 2017-02-02 DIAGNOSIS — I872 Venous insufficiency (chronic) (peripheral): Secondary | ICD-10-CM

## 2017-02-02 DIAGNOSIS — L97909 Non-pressure chronic ulcer of unspecified part of unspecified lower leg with unspecified severity: Secondary | ICD-10-CM

## 2017-02-02 DIAGNOSIS — I83813 Varicose veins of bilateral lower extremities with pain: Secondary | ICD-10-CM | POA: Diagnosis not present

## 2017-02-02 MED ORDER — BACITRACIN ZINC 500 UNIT/GM EX OINT: TOPICAL_OINTMENT | Freq: Every day | CUTANEOUS | 2 refills | Status: DC

## 2017-02-02 NOTE — Progress Notes (Signed)
   Indication:  Patient presents with symptomatic varicose veins of the bilateral lower extremity.  Procedure:  Sclerotherapy using hypertonic saline mixed with 1% Lidocaine was performed on the bilateral lower extremity.  Compression wraps were placed.  The patient tolerated the procedure well.  Plan:  Follow up as needed.  

## 2017-03-06 ENCOUNTER — Ambulatory Visit (INDEPENDENT_AMBULATORY_CARE_PROVIDER_SITE_OTHER): Payer: 59 | Admitting: Vascular Surgery

## 2017-04-03 ENCOUNTER — Ambulatory Visit (INDEPENDENT_AMBULATORY_CARE_PROVIDER_SITE_OTHER): Payer: 59 | Admitting: Vascular Surgery

## 2017-04-03 ENCOUNTER — Encounter (INDEPENDENT_AMBULATORY_CARE_PROVIDER_SITE_OTHER): Payer: Self-pay | Admitting: Vascular Surgery

## 2017-04-03 VITALS — BP 161/98 | HR 60 | Resp 17 | Wt 173.8 lb

## 2017-04-03 DIAGNOSIS — I83813 Varicose veins of bilateral lower extremities with pain: Secondary | ICD-10-CM

## 2017-04-03 NOTE — Progress Notes (Signed)
    MRN : [id number]  Anna Mccarthy is a 61 y.o. (1955/12/21) female who presents with chief complaint of No chief complaint on file. .   Procedure:  Sclerotherapy using hypertonic saline mixed with 1% Lidocaine was performed on lower extremities bilateral.  Compression wraps were placed.  The patient tolerated the procedure well.  Plan:  Follow up as arranged

## 2017-05-24 ENCOUNTER — Other Ambulatory Visit: Payer: Self-pay | Admitting: Family Medicine

## 2017-05-24 DIAGNOSIS — Z1231 Encounter for screening mammogram for malignant neoplasm of breast: Secondary | ICD-10-CM

## 2017-06-29 ENCOUNTER — Ambulatory Visit
Admission: RE | Admit: 2017-06-29 | Discharge: 2017-06-29 | Disposition: A | Discharge status: Home / Self Care | Payer: 59 | Source: Ambulatory Visit | Attending: Family Medicine | Admitting: Family Medicine

## 2017-06-29 DIAGNOSIS — Z1231 Encounter for screening mammogram for malignant neoplasm of breast: Secondary | ICD-10-CM | POA: Insufficient documentation

## 2017-08-01 ENCOUNTER — Other Ambulatory Visit: Payer: Self-pay | Admitting: Obstetrics and Gynecology

## 2017-08-01 ENCOUNTER — Ambulatory Visit
Admission: RE | Admit: 2017-08-01 | Discharge: 2017-08-01 | Disposition: A | Discharge status: Home / Self Care | Payer: Disability Insurance | Source: Ambulatory Visit | Attending: Obstetrics and Gynecology | Admitting: Obstetrics and Gynecology

## 2017-08-01 DIAGNOSIS — M1712 Unilateral primary osteoarthritis, left knee: Secondary | ICD-10-CM | POA: Insufficient documentation

## 2017-08-01 DIAGNOSIS — M1990 Unspecified osteoarthritis, unspecified site: Secondary | ICD-10-CM

## 2017-08-30 ENCOUNTER — Other Ambulatory Visit: Payer: Self-pay | Admitting: Orthopedic Surgery

## 2017-08-30 DIAGNOSIS — M25562 Pain in left knee: Secondary | ICD-10-CM

## 2017-09-14 ENCOUNTER — Ambulatory Visit: Payer: 59

## 2017-09-21 ENCOUNTER — Ambulatory Visit
Admission: RE | Admit: 2017-09-21 | Discharge: 2017-09-21 | Disposition: A | Discharge status: Home / Self Care | Payer: 59 | Source: Ambulatory Visit | Attending: Orthopedic Surgery | Admitting: Orthopedic Surgery

## 2017-09-21 DIAGNOSIS — M1712 Unilateral primary osteoarthritis, left knee: Secondary | ICD-10-CM | POA: Insufficient documentation

## 2017-09-21 DIAGNOSIS — M25462 Effusion, left knee: Secondary | ICD-10-CM | POA: Diagnosis not present

## 2017-09-21 DIAGNOSIS — M25562 Pain in left knee: Secondary | ICD-10-CM | POA: Diagnosis not present

## 2018-01-11 ENCOUNTER — Encounter: Payer: Self-pay | Admitting: Student in an Organized Health Care Education/Training Program

## 2018-01-11 ENCOUNTER — Other Ambulatory Visit: Payer: Self-pay | Admitting: Student in an Organized Health Care Education/Training Program

## 2018-01-11 ENCOUNTER — Ambulatory Visit
Payer: 59 | Attending: Student in an Organized Health Care Education/Training Program | Admitting: Student in an Organized Health Care Education/Training Program

## 2018-01-11 ENCOUNTER — Other Ambulatory Visit: Payer: Self-pay

## 2018-01-11 VITALS — BP 157/93 | HR 70 | Temp 98.2°F | Resp 18 | Ht 63.0 in | Wt 170.0 lb

## 2018-01-11 DIAGNOSIS — M25562 Pain in left knee: Secondary | ICD-10-CM | POA: Diagnosis present

## 2018-01-11 DIAGNOSIS — M7989 Other specified soft tissue disorders: Secondary | ICD-10-CM | POA: Insufficient documentation

## 2018-01-11 DIAGNOSIS — M23307 Other meniscus derangements, unspecified meniscus, left knee: Secondary | ICD-10-CM | POA: Diagnosis not present

## 2018-01-11 DIAGNOSIS — G894 Chronic pain syndrome: Secondary | ICD-10-CM

## 2018-01-11 DIAGNOSIS — M1712 Unilateral primary osteoarthritis, left knee: Secondary | ICD-10-CM

## 2018-01-11 DIAGNOSIS — I872 Venous insufficiency (chronic) (peripheral): Secondary | ICD-10-CM | POA: Insufficient documentation

## 2018-01-11 DIAGNOSIS — I10 Essential (primary) hypertension: Secondary | ICD-10-CM | POA: Diagnosis not present

## 2018-01-11 DIAGNOSIS — M797 Fibromyalgia: Secondary | ICD-10-CM | POA: Diagnosis not present

## 2018-01-11 DIAGNOSIS — G8929 Other chronic pain: Secondary | ICD-10-CM | POA: Diagnosis not present

## 2018-01-11 MED ORDER — DICLOFENAC SODIUM 75 MG PO TBEC: 75.0000 mg | DELAYED_RELEASE_TABLET | Freq: Two times a day (BID) | ORAL | 1 refills | Status: DC

## 2018-01-11 NOTE — Progress Notes (Signed)
Safety precautions to be maintained throughout the outpatient stay will include: orient to surroundings, keep bed in low position, maintain call bell within reach at all times, provide assistance with transfer out of bed and ambulation.  

## 2018-01-11 NOTE — Progress Notes (Signed)
Patient's Name: Anna Mccarthy  MRN: [id number]  Referring Provider: Hessie Knows, MD  DOB: [birth year]  PCP: [place] Pink, MD  DOS: 01/11/2018  Note by: Gillis Santa, MD  Service setting: Ambulatory outpatient  Specialty: Interventional Pain Management  Location: [facility] (AMB) Pain Management Facility  Visit type: Initial Patient Evaluation  Patient type: New Patient   Primary Reason(s) for Visit: Encounter for initial evaluation of one or more chronic problems (new to examiner) potentially causing chronic pain, and posing a threat to normal musculoskeletal function. (Level of risk: High) CC: Knee Pain (left)  HPI  Anna Mccarthy is a 62 y.o. year old, female patient, who comes today to see [place] for the first time for an initial evaluation of her chronic pain. She has Varicose veins of bilateral lower extremities with pain; Chronic venous insufficiency; Pain in limb; Swelling of limb; DJD (degenerative joint disease); and Venous ulcer ([place]) on their problem list. Today she comes in for evaluation of her Knee Pain (left)  Pain Assessment: Location: Left Knee Radiating: left upper leg and hip,  Onset: More than a month ago Duration: Chronic pain Quality: (persistent) Severity: 7 /10 (subjective, self-reported pain score)  Note: Reported level is inconsistent with clinical observations.                         When using our objective Pain Scale, levels between 6 and 10/10 are said to belong in an emergency room, as it progressively worsens from a 6/10, described as severely limiting, requiring emergency care not usually available at an outpatient pain management facility. At a 6/10 level, communication becomes difficult and requires great effort. Assistance to reach the emergency department may be required. Facial flushing and profuse sweating along with potentially dangerous increases in heart rate and blood pressure will be evident. Effect on ADL:   Timing: Constant Modifying factors: ice, Voltaren gel,  Cortisone injections BP: (!) 157/93  HR: 70  Onset and Duration: Date of onset: 12/2013 and Present longer than 3 months Cause of pain: arthritis and torn meniscus Severity: Getting worse, NAS-11 at its worse: 10/10, NAS-11 at its best: 6/10, NAS-11 now: 9/10 and NAS-11 on the average: 8/10 Timing: Not influenced by the time of the day, After activity or exercise and After a period of immobility Aggravating Factors: Bending, Climbing, Kneeling, Prolonged sitting, Prolonged standing, Surgery made it worse, Twisting, Walking and Working Alleviating Factors: Cold packs, Using a brace and Warm showers or baths Associated Problems: Inability to concentrate, Numbness, Pain that wakes patient up and Pain that does not allow patient to sleep Quality of Pain: Aching, Agonizing, Annoying, Dull, Nagging, Shooting, Stabbing and Uncomfortable Previous Examinations or Tests: MRI scan, X-rays and Orthopedic evaluation Previous Treatments: The patient denies any previous treatments  The patient comes into the clinics today for the first time for a chronic pain management evaluation.   Patient is a 62 year old female with a history of left knee medial meniscal surgery in August 2017 for meniscal tear who has developed left persistent knee pain most pronounced in the medial portion that is been refractory to Visco supplementation.  Patient has had 3 intra-articular steroid injections in her left knee which provided short-term benefit but has been told that she cannot get any additional ones from the orthopedist given steroid exposure in her knee.  She is currently on Celebrex 100 mill grams twice daily, Cymbalta 20 mill grams twice daily, Zanaflex 2 mg nightly.  Patient also takes Xanax  as needed.  We will focus on interventional therapy primarily left genicular nerve block and possible genicular nerve radio frequency ablation.  Today I took the time to provide the patient with information regarding my pain  practice. The patient was informed that my practice is divided into two sections: an interventional pain management section, as well as a completely separate and distinct medication management section. I explained that I have procedure days for my interventional therapies, and evaluation days for follow-ups and medication management. Because of the amount of documentation required during both, they are kept separated. This means that there is the possibility that she may be scheduled for a procedure on one day, and medication management the next. I have also informed her that because of staffing and facility limitations, I no longer take patients for medication management only. To illustrate the reasons for this, I gave the patient the example of surgeons, and how inappropriate it would be to refer a patient to his/her care, just to write for the post-surgical antibiotics on a surgery done by a different surgeon.   Because interventional pain management is my board-certified specialty, the patient was informed that joining my practice means that they are open to any and all interventional therapies. I made it clear that this does not mean that they will be forced to have any procedures done. What this means is that I believe interventional therapies to be essential part of the diagnosis and proper management of chronic pain conditions. Therefore, patients not interested in these interventional alternatives will be better served under the care of a different practitioner.  The patient was also made aware of my Comprehensive Pain Management Safety Guidelines where by joining my practice, they limit all of their nerve blocks and joint injections to those done by our practice, for as long as we are retained to manage their care.    [place] PMP: Six (6) year initial data search conducted.             [place] Department of public safety, offender search: (Public Information) Non-contributory  Opioid Risk Tool - 01/11/18  1057      Family History of Substance Abuse   Alcohol  Negative    Illegal Drugs  Negative    Rx Drugs  Negative      Personal History of Substance Abuse   Alcohol  Negative    Illegal Drugs  Negative    Rx Drugs  Negative      Age   Age between 54-45 years   No      History of Preadolescent Sexual Abuse   History of Preadolescent Sexual Abuse  Negative or Female      Psychological Disease   Psychological Disease  Negative    Depression  Negative      Total Score   Opioid Risk Tool Scoring  0    Opioid Risk Interpretation  Low Risk      ORT Scoring interpretation table:  Score <3 = Low Risk for SUD  Score between 4-7 = Moderate Risk for SUD  Score >8 = High Risk for Opioid Abuse   PHQ-2 Depression Scale:  Total score: 0  PHQ-2 Scoring interpretation table: (Score and probability of major depressive disorder)  Score 0 = No depression  Score 1 = 15.4% Probability  Score 2 = 21.1% Probability  Score 3 = 38.4% Probability  Score 4 = 45.5% Probability  Score 5 = 56.4% Probability  Score 6 = 78.6% Probability   PHQ-9  Depression Scale:  Total score: 0  PHQ-9 Scoring interpretation table:  Score 0-4 = No depression  Score 5-9 = Mild depression  Score 10-14 = Moderate depression  Score 15-19 = Moderately severe depression  Score 20-27 = Severe depression (2.4 times higher risk of SUD and 2.89 times higher risk of overuse)   Pharmacologic Plan: Non-opioid analgesic therapy offered.  Patient on chronic Xanax therapy            Initial impression: Poor candidate for opioid analgesics.  Meds   Current Outpatient Medications:  .  ALPRAZolam (XANAX) 0.25 MG tablet, TK 1 T PO BID PRN, Disp: , Rfl: 0 .  DULoxetine (CYMBALTA) 20 MG capsule, 20 mg 2 (two) times daily. , Disp: , Rfl:  .  escitalopram (LEXAPRO) 20 MG tablet, Take 20 mg by mouth at bedtime. , Disp: , Rfl:  .  hydrochlorothiazide (HYDRODIURIL) 25 MG tablet, Take 25 mg by mouth daily., Disp: , Rfl:  .  omeprazole  (PRILOSEC) 40 MG capsule, Take 40 mg by mouth daily., Disp: , Rfl:  .  tiZANidine (ZANAFLEX) 2 MG tablet, Take by mouth at bedtime., Disp: , Rfl:  .  ALPRAZolam (XANAX) 0.5 MG tablet, Take 0.5 mg by mouth at bedtime. , Disp: , Rfl: 2 .  b complex vitamins tablet, Take 1 tablet by mouth daily., Disp: , Rfl:  .  diclofenac (VOLTAREN) 75 MG EC tablet, Take 1 tablet (75 mg total) by mouth 2 (two) times daily after a meal., Disp: 60 tablet, Rfl: 1 .  Omega-3 Fatty Acids (FISH OIL PO), Take by mouth., Disp: , Rfl:  .  traMADol (ULTRAM) 50 MG tablet, Take by mouth., Disp: , Rfl:  .  Turmeric 1053 MG TABS, Take by mouth., Disp: , Rfl:  .  vitamin E 100 UNIT capsule, Take 100 Units by mouth at bedtime. , Disp: , Rfl:   Imaging Review  Cervical Imaging:  Cervical DG 2-3 views:  Results for orders placed during the hospital encounter of 08/01/17  DG Cervical Spine 2 or 3 views   Narrative CLINICAL DATA:  62 year old female with lower back and bilateral hip pain with posterior neck pain for years. No injury. Prior hernia repair and abdominal hysterectomy. Initial encounter.  EXAM: CERVICAL SPINE - 2-3 VIEW  COMPARISON:  None.  FINDINGS: Straightening of the cervical spine.  Mild to moderate C5-6 disc space narrowing with bilateral uncinate hypertrophy.  Small cervical ribs.  No fracture.  No abnormal prevertebral soft tissue swelling.  Lung apices clear.  IMPRESSION: Mild to moderate C5-6 disc space narrowing with bilateral uncinate hypertrophy.   Electronically Signed   By: Genia Del M.D.   On: 08/01/2017 20:53      Lumbar DG 2-3 views:  Results for orders placed during the hospital encounter of 08/01/17  DG Lumbar Spine 2-3 Views   Narrative CLINICAL DATA:  62 year old female with lower back and bilateral hip pain with posterior neck pain for years. No injury. Prior hernia repair and abdominal hysterectomy. Initial encounter.  EXAM: LUMBAR SPINE - 2-3  VIEW  COMPARISON:  None.  FINDINGS: Minimal anterior slip L4 secondary to mild facet degenerative changes with minimal L4-5 disc space narrowing.  Otherwise negative exam.  IMPRESSION: Minimal anterior slip L4 secondary to mild facet degenerative changes. Minimal L4-5 disc space narrowing.   Electronically Signed   By: Genia Del M.D.   On: 08/01/2017 20:47     Hip-R DG 2-3 views:  Results for orders placed during  the hospital encounter of 08/01/17  DG HIP UNILAT WITH PELVIS 2-3 VIEWS RIGHT   Narrative CLINICAL DATA:  62 year old female with lower back and bilateral hip pain with posterior neck pain for years. No injury. Prior hernia repair and abdominal hysterectomy. Initial encounter.  EXAM: DG HIP (WITH OR WITHOUT PELVIS) 2-3V RIGHT  COMPARISON:  None.  FINDINGS: No fracture or dislocation. No evidence of arthropathy or plain film finding of right femoral head avascular necrosis. Minimal degenerative changes pubic symphysis.  IMPRESSION: Negative.   Electronically Signed   By: Genia Del M.D.   On: 08/01/2017 20:56    Hip-L DG 2-3 views:  Results for orders placed during the hospital encounter of 08/01/17  DG HIP UNILAT WITH PELVIS 2-3 VIEWS LEFT   Narrative CLINICAL DATA:  62 year old female with lower back and bilateral hip pain with posterior neck pain for years. No injury. Prior hernia repair and abdominal hysterectomy. Initial encounter.  EXAM: DG HIP (WITH OR WITHOUT PELVIS) 2-3V LEFT  COMPARISON:  None.  FINDINGS: Small osteophyte left femoral head/neck region without significant joint space narrowing.  No plain film evidence of left femoral head avascular necrosis.  No fracture or dislocation.  Minimal pubic symphysis degenerative changes.  IMPRESSION: Small osteophyte left femoral head/neck region without significant joint space narrowing.   Electronically Signed   By: Genia Del M.D.   On: 08/01/2017 20:50    Knee-L  MR w contrast:  Results for orders placed during the hospital encounter of 09/21/17  MR KNEE LEFT WO CONTRAST   Narrative CLINICAL DATA:  Medial knee pain and swelling. Prior medial meniscectomy in August 2017.  EXAM: MRI OF THE LEFT KNEE WITHOUT CONTRAST  TECHNIQUE: Multiplanar, multisequence MR imaging of the knee was performed. No intravenous contrast was administered.  COMPARISON:  MRI left knee dated January 05, 2016.  FINDINGS: MENISCI  Medial meniscus: Prior partial meniscectomy of the posterior horn. Blunting and diminutive appearance of the body. Degenerative signal within the anterior and posterior horn.  Lateral meniscus: Intrasubstance degenerative signal without discrete tear.  LIGAMENTS  Cruciates:  Intact ACL and PCL.  Collaterals: Medial collateral ligament is intact. Lateral collateral ligament complex is intact.  CARTILAGE  Patellofemoral:  Partial-thickness cartilage loss.  Medial: Large areas of full-thickness cartilage loss over the peripheral medial femoral condyle and medial tibial plateau with underlying subchondral marrow edema.  Lateral: Focal full-thickness cartilage loss along the anterior aspect of the lateral tibial plateau.  Joint: Trace joint effusion. Normal Hoffa's fat. No plical thickening.  Popliteal Fossa:  No Baker cyst. Intact popliteus tendon.  Extensor Mechanism: Intact quadriceps tendon and patellar tendon. Intact medial and lateral patellar retinaculum. Intact MPFL.  Bones: No focal marrow signal abnormality. No fracture or dislocation. Small tricompartmental osteophytes.  Other: None.  IMPRESSION: 1. Prior partial meniscectomy of the medial meniscus posterior horn. Blunting and diminutive appearance of the body could also reflect prior partial meniscectomy versus new radial tear. 2. Tricompartmental osteoarthritis, progressed in the medial compartment with new large areas of full-thickness cartilage loss  as described above. 3. Trace joint effusion.   Electronically Signed   By: Titus Dubin M.D.   On: 09/21/2017 11:40     Complexity Note: Imaging results reviewed. Results shared with Ms. Kilker, using Layman's terms.                         ROS  Cardiovascular: High blood pressure Pulmonary or Respiratory: Snoring  Neurological: No reported  neurological signs or symptoms such as seizures, abnormal skin sensations, urinary and/or fecal incontinence, being born with an abnormal open spine and/or a tethered spinal cord Review of Past Neurological Studies: No results found for this or any previous visit. Psychological-Psychiatric: No reported psychological or psychiatric signs or symptoms such as difficulty sleeping, anxiety, depression, delusions or hallucinations (schizophrenial), mood swings (bipolar disorders) or suicidal ideations or attempts Gastrointestinal: Reflux or heatburn and Alternating episodes iof diarrhea and constipation (IBS-Irritable bowe syndrome) Genitourinary: No reported renal or genitourinary signs or symptoms such as difficulty voiding or producing urine, peeing blood, non-functioning kidney, kidney stones, difficulty emptying the bladder, difficulty controlling the flow of urine, or chronic kidney disease Hematological: No reported hematological signs or symptoms such as prolonged bleeding, low or poor functioning platelets, bruising or bleeding easily, hereditary bleeding problems, low energy levels due to low hemoglobin or being anemic Endocrine: No reported endocrine signs or symptoms such as high or low blood sugar, rapid heart rate due to high thyroid levels, obesity or weight gain due to slow thyroid or thyroid disease Rheumatologic: Joint aches and or swelling due to excess weight (Osteoarthritis) and Generalized muscle aches (Fibromyalgia) Musculoskeletal: Negative for myasthenia gravis, muscular dystrophy, multiple sclerosis or malignant hyperthermia Work  History: Out of work due to pain  Allergies  Ms. Achille is allergic to percocet [oxycodone-acetaminophen] and sulfa antibiotics.  Laboratory Chemistry  Inflammation Markers (CRP: Acute Phase) (ESR: Chronic Phase) No results found for: CRP, ESRSEDRATE, LATICACIDVEN                       Rheumatology Markers No results found for: RF, ANA, LABURIC, URICUR, LYMEIGGIGMAB, LYMEABIGMQN, HLAB27                      Renal Function Markers Lab Results  Component Value Date   BUN 18 10/07/2014   CREATININE 0.86 10/07/2014   GFRAA >60 10/07/2014   GFRNONAA >60 10/07/2014                             Hepatic Function Markers Lab Results  Component Value Date   AST 24 10/07/2014   ALT 20 10/07/2014   ALBUMIN 4.4 10/07/2014   ALKPHOS 78 10/07/2014                        Electrolytes Lab Results  Component Value Date   NA 135 10/07/2014   K 3.7 10/07/2014   CL 99 (L) 10/07/2014   CALCIUM 9.0 10/07/2014                        Neuropathy Markers No results found for: VITAMINB12, FOLATE, HGBA1C, HIV                      Bone Pathology Markers No results found for: VD25OH, VD125OH2TOT, [id number], BF3832NV9, 25OHVITD1, 25OHVITD2, 25OHVITD3, TESTOFREE, TESTOSTERONE                       Coagulation Parameters Lab Results  Component Value Date   PLT 387 10/07/2014                        Cardiovascular Markers Lab Results  Component Value Date   TROPONINI <0.03 10/07/2014   HGB 13.5 10/07/2014   HCT 39.6 10/07/2014  CA Markers No results found for: CEA, CA125, LABCA2                      Note: Lab results reviewed.  PFSH  Drug: Ms. Stegner  reports that she does not use drugs. Alcohol:  reports that she drinks about 1.2 oz of alcohol per week. Tobacco:  reports that she has never smoked. She has never used smokeless tobacco. Medical:  has a past medical history of Allergy, Anxiety, Arthritis, Bronchitis, Cancer ([place]), GERD (gastroesophageal reflux  disease), and Hypertension. Family: family history includes Breast cancer in her cousin and maternal aunt; Ovarian cancer in her sister.  Past Surgical History:  Procedure Laterality Date  . ABDOMINAL HYSTERECTOMY  1996  . BREAST BIOPSY Left 2011   stereo done by dr. Bary Castilla   . COLONOSCOPY    . COLONOSCOPY WITH PROPOFOL N/A 05/30/2016   Procedure: COLONOSCOPY WITH PROPOFOL;  Surgeon: Lollie Sails, MD;  Location: [facility] ENDOSCOPY;  Service: Endoscopy;  Laterality: N/A;  . HERNIA REPAIR  2012  . KNEE ARTHROSCOPY WITH MEDIAL MENISECTOMY Left 02/18/2016   Procedure: KNEE ARTHROSCOPY WITH PARTIAL MEDIAL MENISECTOMY;  Surgeon: Hessie Knows, MD;  Location: [facility] ORS;  Service: Orthopedics;  Laterality: Left;  . TUBAL LIGATION  1984   Active Ambulatory Problems    Diagnosis Date Noted  . Varicose veins of bilateral lower extremities with pain 05/16/2016  . Chronic venous insufficiency 05/16/2016  . Pain in limb 05/16/2016  . Swelling of limb 05/16/2016  . DJD (degenerative joint disease) 05/16/2016  . Venous ulcer ([place]) 02/02/2017   Resolved Ambulatory Problems    Diagnosis Date Noted  . No Resolved Ambulatory Problems   Past Medical History:  Diagnosis Date  . Allergy   . Anxiety   . Arthritis   . Bronchitis   . Cancer ([place])   . GERD (gastroesophageal reflux disease)   . Hypertension    Constitutional Exam  General appearance: Well nourished, well developed, and well hydrated. In no apparent acute distress Vitals:   01/11/18 1049  BP: (!) 157/93  Pulse: 70  Resp: 18  Temp: 98.2 F (36.8 C)  TempSrc: Oral  SpO2: 98%  Weight: 170 lb (77.1 kg)  Height: _0  (1.6 m)   BMI Assessment: Estimated body mass index is 30.11 kg/m as calculated from the following:   Height as of this encounter: _1  (1.6 m).   Weight as of this encounter: 170 lb (77.1 kg).  BMI interpretation table: BMI level Category Range association with higher incidence of chronic pain  <18 kg/m2  Underweight   18.5-24.9 kg/m2 Ideal body weight   25-29.9 kg/m2 Overweight Increased incidence by 20%  30-34.9 kg/m2 Obese (Class I) Increased incidence by 68%  35-39.9 kg/m2 Severe obesity (Class II) Increased incidence by 136%  >40 kg/m2 Extreme obesity (Class III) Increased incidence by 254%   Patient's current BMI Ideal Body weight  Body mass index is 30.11 kg/m. Ideal body weight: 52.4 kg (115 lb 8.3 oz) Adjusted ideal body weight: 62.3 kg (137 lb 5 oz)   BMI Readings from Last 4 Encounters:  01/11/18 30.11 kg/m  04/03/17 30.79 kg/m  02/02/17 31.00 kg/m  01/05/17 31.46 kg/m   Wt Readings from Last 4 Encounters:  01/11/18 170 lb (77.1 kg)  04/03/17 173 lb 12.8 oz (78.8 kg)  02/02/17 175 lb (79.4 kg)  01/05/17 174 lb 12.8 oz (79.3 kg)  Psych/Mental status: Alert, oriented x 3 (person, place, & time)  Eyes: PERLA Respiratory: No evidence of acute respiratory distress  Cervical Spine Area Exam  Skin & Axial Inspection: No masses, redness, edema, swelling, or associated skin lesions Alignment: Symmetrical Functional ROM: Unrestricted ROM      Stability: No instability detected Muscle Tone/Strength: Functionally intact. No obvious neuro-muscular anomalies detected. Sensory (Neurological): Unimpaired Palpation: No palpable anomalies              Upper Extremity (UE) Exam    Side: Right upper extremity  Side: Left upper extremity  Skin & Extremity Inspection: Skin color, temperature, and hair growth are WNL. No peripheral edema or cyanosis. No masses, redness, swelling, asymmetry, or associated skin lesions. No contractures.  Skin & Extremity Inspection: Skin color, temperature, and hair growth are WNL. No peripheral edema or cyanosis. No masses, redness, swelling, asymmetry, or associated skin lesions. No contractures.  Functional ROM: Unrestricted ROM          Functional ROM: Unrestricted ROM          Muscle Tone/Strength: Functionally intact. No obvious neuro-muscular  anomalies detected.  Muscle Tone/Strength: Functionally intact. No obvious neuro-muscular anomalies detected.  Sensory (Neurological): Unimpaired          Sensory (Neurological): Unimpaired          Palpation: No palpable anomalies              Palpation: No palpable anomalies              Provocative Test(s):  Phalen's test: deferred Tinel's test: deferred Apley's scratch test (touch opposite shoulder):  Action 1 (Across chest): deferred Action 2 (Overhead): deferred Action 3 (LB reach): deferred   Provocative Test(s):  Phalen's test: deferred Tinel's test: deferred Apley's scratch test (touch opposite shoulder):  Action 1 (Across chest): deferred Action 2 (Overhead): deferred Action 3 (LB reach): deferred    Thoracic Spine Area Exam  Skin & Axial Inspection: No masses, redness, or swelling Alignment: Symmetrical Functional ROM: Unrestricted ROM Stability: No instability detected Muscle Tone/Strength: Functionally intact. No obvious neuro-muscular anomalies detected. Sensory (Neurological): Unimpaired Muscle strength & Tone: No palpable anomalies  Lumbar Spine Area Exam  Skin & Axial Inspection: No masses, redness, or swelling Alignment: Symmetrical Functional ROM: Unrestricted ROM       Stability: No instability detected Muscle Tone/Strength: Functionally intact. No obvious neuro-muscular anomalies detected. Sensory (Neurological): Unimpaired Palpation: No palpable anomalies       Provocative Tests: Lumbar Hyperextension/rotation test: deferred today       Lumbar quadrant test (Kemp's test): deferred today       Lumbar Lateral bending test: deferred today       Patrick's Maneuver: deferred today                   FABER test: deferred today                   Thigh-thrust test: deferred today       S-I compression test: deferred today       S-I distraction test: deferred today        Gait & Posture Assessment  Ambulation: Unassisted Gait: Relatively normal for age and  body habitus Posture: WNL   Lower Extremity Exam    Side: Right lower extremity  Side: Left lower extremity  Stability: No instability observed          Stability: No instability observed          Skin & Extremity Inspection: Skin color, temperature, and hair growth  are WNL. No peripheral edema or cyanosis. No masses, redness, swelling, asymmetry, or associated skin lesions. No contractures.  Skin & Extremity Inspection: Evidence of prior arthroplastic surgery  Functional ROM: Unrestricted ROM                  Functional ROM: Pain restricted ROM                  Muscle Tone/Strength: Functionally intact. No obvious neuro-muscular anomalies detected.  Muscle Tone/Strength: Functionally intact. No obvious neuro-muscular anomalies detected.  Sensory (Neurological): Unimpaired  Sensory (Neurological): Arthropathic arthralgia  Palpation: No palpable anomalies  Palpation: Complains of area being tender to palpation   Assessment  Primary Diagnosis & Pertinent Problem List: The primary encounter diagnosis was Primary osteoarthritis of left knee. Diagnoses of Meniscus degeneration, left, Chronic pain of left knee, Fibromyalgia, and Chronic pain syndrome were also pertinent to this visit.  Visit Diagnosis (New problems to examiner): 1. Primary osteoarthritis of left knee   2. Meniscus degeneration, left   3. Chronic pain of left knee   4. Fibromyalgia   5. Chronic pain syndrome     General Recommendations: The pain condition that the patient suffers from is best treated with a multidisciplinary approach that involves an increase in physical activity to prevent de-conditioning and worsening of the pain cycle, as well as psychological counseling (formal and/or informal) to address the co-morbid psychological affects of pain. Treatment will often involve judicious use of pain medications and interventional procedures to decrease the pain, allowing the patient to participate in the physical activity that  will ultimately produce long-lasting pain reductions. The goal of the multidisciplinary approach is to return the patient to a higher level of overall function and to restore their ability to perform activities of daily living.  62 year old female with a history of left knee pain status post left knee meniscus repair in 2017 who has tried various oral therapies including NSAIDs as well as left intra-articular steroid injections as well as left intra-articular Visco supplementation to help with her knee symptoms. Visco supplementation was not helpful but the patient did obtain short-term benefit with intra-articular steroid therapy.  She has been told by her orthopedist that she should limit her intra-articular steroid injections.  In regards to her left persistent knee pain secondary to severe tricompartmental osteoarthritis in the context of having left knee meniscus repair, we discussed left knee genicular nerve block with possible radio frequency ablation.  Risks and benefits of this procedure were discussed. Patient would like to proceed.  Regards to medication management, patient will not be a candidate for chronic opioid therapy given her chronic use of Xanax.  We will focus on non-opioid analgesics as well as interventional therapies.  I instructed the patient discontinue her Celebrex and start diclofenac 75 mg twice daily for her knee osteoarthritis.  We will also provide referral to physical therapy specifically aquatic therapy to help with left knee strengthening and range of motion.  Plan: -Stop Celebrex.  Start diclofenac 75 mg twice daily -Plan for diagnostic left knee genicular nerve block with possible radio frequency ablation thereafter -Referral to physical therapy/aquatic therapy for left knee range of motion and strengthening exercises. -We will focus on non-opioid management and interventional therapies as patient is on chronic Xanax therapy.  Ordered Lab-work, Procedure(s),  Referral(s), & Consult(s): Orders Placed This Encounter  Procedures  . GENICULAR NERVE BLOCK  . Ambulatory referral to Physical Therapy   Pharmacotherapy (current): Medications ordered:  Meds ordered this encounter  Medications  . diclofenac (VOLTAREN) 75 MG EC tablet    Sig: Take 1 tablet (75 mg total) by mouth 2 (two) times daily after a meal.    Dispense:  60 tablet    Refill:  1   Medications administered during this visit: Verdis Frederickson C. Mondor had no medications administered during this visit.   Interventional management options: Ms. Josephson was informed that there is no guarantee that she would be a candidate for interventional therapies. The decision will be based on the results of diagnostic studies, as well as Ms. Moxley's risk profile.  Procedure(s) under consideration:  Left knee genicular nerve block, left knee genicular radiofrequency ablation   Provider-requested follow-up: Return in about 3 weeks (around 02/01/2018) for Procedure.  No future appointments.  Primary Care Physician: Maryland Pink, MD Location: [facility] Note by: Gillis Santa, M.D, Date: 01/11/2018; Time: 2:05 PM  Patient Instructions  ____________________________________________________________________________________________  Genicular Nerve Block  What is a genicular nerve block? A genicular nerve block is the injection of a local anesthetic to block the nerves that transmits pain from the knee.  What is the purpose of a facet nerve block? A genicular nerve block is a diagnostic procedure to determine if the pathologic changes (i.e. arthritis, meniscal tears, etc) and inflammation within the knee joint is the source of your knee pain. It also confirms that the knee pain will respond well to the actual treatment procedure. If a genicular nerve block works, it will give you relief for several hours. After that, the pain is expected to return to normal. This test is always  performed twice (usually a week or two apart) because two successful tests are required to move onto treatment. If both diagnostic tests are positive, then we schedule a treatment called radiofrequency (RF) ablation. In this procedure, the same nerves are cauterized, which typically leads to pain relief for 4 -18 months. If this process works well for one knee, it can be performed on the other knee if needed.  How is the procedure performed? You will be placed on the procedure table. The injection site is sterilized with either iodine or chlorhexadine. The site to be injected is numbed with a local anesthetic, and a needle is directed to the target area. X-ray guidance is used to ensure proper placement and positioning of the needle. When the needle is properly positioned near the genicular nerve, local anesthetic is injected to numb that nerve. This will be repeated at multiple sites around the knee to block all genicular nerves.  Will the procedure be painful? The injection can be painful and we therefore provide the option of receiving IV sedation. IV sedation, combined with local anesthetic, can make the injection nearly pain free. It allows you to remain very still during the procedure, which can also make the injection easier, faster, and more successful. If you decide to have IV sedation, you must have a driver to get you home safely afterwards. In addition, you cannot have anything to eat or drink within 8 hours of your appointment (clear liquids are allowed until 3 hours before the procedure). If you take medications for diabetes, these medications may need to be adjusted the morning of the procedure. Your primary care physician can help you with this adjustment.  What are the discharge instructions? If you received IV sedation do not drive or operate machinery for at least 24 hours after the procedure. You may return to work the next day following your procedure. You  may resume your normal diet  immediately. Do not engage in any strenuous activity for 24 hours. You should, however, engage in moderate activity that typically causes your ususal pain. If the block works, those activities should not be painful for several hours after the injection. Do not take a bath, swim, or use a hot tub for 24 hours (you may take a shower). Call the office if you have any of the following: severe pain afterwards (different than your usual symptoms), redness/swelling/discharge at the injection site(s), fevers/chills, difficulty with bowel or bladder functions.  What are the risks and side effects? The complication rate for this procedure is very low. Whenever a needle enters the skin, bleeding or infection can occur. Some other serious but extremely rare risks include paralysis and death. You may have an allergic reaction to any of the medications used. If you have a known allergy to any medications, especially local anesthetics, notify our staff before the procedure takes place. You may experience any of the following side effects up to 4 - 6 hours after the procedure: . Leg muscle weakness or numbness may occur due to the local anesthetic affecting the nerves that control your legs (this is a temporary affect and it is not paralysis). If you have any leg weakness or numbness, walk only with assistance in order to prevent falls and injury. Your leg strength will return slowly and completely. . Dizziness may occur due to a decrease in your blood pressure. If this occurs, remain in a seated or lying position. Gradually sit up, and then stand after at least 10 minutes of sitting. . Mild headaches may occur. Drink fluids and take pain medications if needed. If the headaches persist or become severe, call the office. . Mild discomfort at the injection site can occur. This typically lasts for a few hours but can persist for a couple days. If this occurs, take anti-inflammatories or pain medications, apply ice to the area  the day of the procedure. If it persists, apply moist heat in the day(s) following.  The side effects listed above can be normal. They are not dangerous and will resolve on their own. If, however, you experience any of the following, a complication may have occurred and you should either contact your doctor. If he is not readily available, then you should proceed to the closest urgent care center for evaluation: . Severe or progressive pain at the injection site(s) . Arm or leg weakness that progressively worsens or persists for longer than 8 hours . Severe or progressive redness, swelling, or discharge from the injections site(s) . Fevers, chills, nausea, or vomiting . Bowel or bladder dysfunction (i.e. inability to urinate or pass stool or difficulty controlling either)  How long does it take for the procedure to work? You should feel relief from your usual pain within the first hour. Again, this is only expected to last for several hours, at the most. Remember, you may be sore in the middle part of your back from the needles, and you must distinguish this from your usual pain. ____________________________________________________________________________________________  ____________________________________________________________________________________________  Preparing for Procedure with Sedation  Instructions: . Oral Intake: Do not eat or drink anything for at least 8 hours prior to your procedure. . Transportation: Public transportation is not allowed. Bring an adult driver. The driver must be physically present in our waiting room before any procedure can be started. Marland Kitchen Physical Assistance: Bring an adult physically capable of assisting you, in the event you need help. This  adult should keep you company at home for at least 6 hours after the procedure. . Blood Pressure Medicine: Take your blood pressure medicine with a sip of water the morning of the procedure. . Blood thinners:   . Diabetics on insulin: Notify the staff so that you can be scheduled 1st case in the morning. If your diabetes requires high dose insulin, take only  of your normal insulin dose the morning of the procedure and notify the staff that you have done so. . Preventing infections: Shower with an antibacterial soap the morning of your procedure. . Build-up your immune system: Take 1000 mg of Vitamin C with every meal (3 times a day) the day prior to your procedure. Marland Kitchen Antibiotics: Inform the staff if you have a condition or reason that requires you to take antibiotics before dental procedures. . Pregnancy: If you are pregnant, call and cancel the procedure. . Sickness: If you have a cold, fever, or any active infections, call and cancel the procedure. . Arrival: You must be in the facility at least 30 minutes prior to your scheduled procedure. . Children: Do not bring children with you. . Dress appropriately: Bring dark clothing that you would not mind if they get stained. . Valuables: Do not bring any jewelry or valuables.  Procedure appointments are reserved for interventional treatments only. Marland Kitchen No Prescription Refills. . No medication changes will be discussed during procedure appointments. . No disability issues will be discussed.  Remember:  Regular Business hours are:  Monday to Thursday 8:00 AM to 4:00 PM  Provider's Schedule: Milinda Pointer, MD:  Procedure days: Tuesday and Thursday 7:30 AM to 4:00 PM  Gillis Santa, MD:  Procedure days: Monday and Wednesday 7:30 AM to 4:00 PM  A prescription for Diclofenac was sent to your pharmacy.+ ____________________________________________________________________________________________

## 2018-01-11 NOTE — Patient Instructions (Signed)
____________________________________________________________________________________________  Genicular Nerve Block  What is a genicular nerve block? A genicular nerve block is the injection of a local anesthetic to block the nerves that transmits pain from the knee.  What is the purpose of a facet nerve block? A genicular nerve block is a diagnostic procedure to determine if the pathologic changes (i.e. arthritis, meniscal tears, etc) and inflammation within the knee joint is the source of your knee pain. It also confirms that the knee pain will respond well to the actual treatment procedure. If a genicular nerve block works, it will give you relief for several hours. After that, the pain is expected to return to normal. This test is always performed twice (usually a week or two apart) because two successful tests are required to move onto treatment. If both diagnostic tests are positive, then we schedule a treatment called radiofrequency (RF) ablation. In this procedure, the same nerves are cauterized, which typically leads to pain relief for 4 -18 months. If this process works well for one knee, it can be performed on the other knee if needed.  How is the procedure performed? You will be placed on the procedure table. The injection site is sterilized with either iodine or chlorhexadine. The site to be injected is numbed with a local anesthetic, and a needle is directed to the target area. X-ray guidance is used to ensure proper placement and positioning of the needle. When the needle is properly positioned near the genicular nerve, local anesthetic is injected to numb that nerve. This will be repeated at multiple sites around the knee to block all genicular nerves.  Will the procedure be painful? The injection can be painful and we therefore provide the option of receiving IV sedation. IV sedation, combined with local anesthetic, can make the injection nearly pain free. It allows you to remain very  still during the procedure, which can also make the injection easier, faster, and more successful. If you decide to have IV sedation, you must have a driver to get you home safely afterwards. In addition, you cannot have anything to eat or drink within 8 hours of your appointment (clear liquids are allowed until 3 hours before the procedure). If you take medications for diabetes, these medications may need to be adjusted the morning of the procedure. Your primary care physician can help you with this adjustment.  What are the discharge instructions? If you received IV sedation do not drive or operate machinery for at least 24 hours after the procedure. You may return to work the next day following your procedure. You may resume your normal diet immediately. Do not engage in any strenuous activity for 24 hours. You should, however, engage in moderate activity that typically causes your ususal pain. If the block works, those activities should not be painful for several hours after the injection. Do not take a bath, swim, or use a hot tub for 24 hours (you may take a shower). Call the office if you have any of the following: severe pain afterwards (different than your usual symptoms), redness/swelling/discharge at the injection site(s), fevers/chills, difficulty with bowel or bladder functions.  What are the risks and side effects? The complication rate for this procedure is very low. Whenever a needle enters the skin, bleeding or infection can occur. Some other serious but extremely rare risks include paralysis and death. You may have an allergic reaction to any of the medications used. If you have a known allergy to any medications, especially local anesthetics, notify  our staff before the procedure takes place. You may experience any of the following side effects up to 4 - 6 hours after the procedure: . Leg muscle weakness or numbness may occur due to the local anesthetic affecting the nerves that control  your legs (this is a temporary affect and it is not paralysis). If you have any leg weakness or numbness, walk only with assistance in order to prevent falls and injury. Your leg strength will return slowly and completely. . Dizziness may occur due to a decrease in your blood pressure. If this occurs, remain in a seated or lying position. Gradually sit up, and then stand after at least 10 minutes of sitting. . Mild headaches may occur. Drink fluids and take pain medications if needed. If the headaches persist or become severe, call the office. . Mild discomfort at the injection site can occur. This typically lasts for a few hours but can persist for a couple days. If this occurs, take anti-inflammatories or pain medications, apply ice to the area the day of the procedure. If it persists, apply moist heat in the day(s) following.  The side effects listed above can be normal. They are not dangerous and will resolve on their own. If, however, you experience any of the following, a complication may have occurred and you should either contact your doctor. If he is not readily available, then you should proceed to the closest urgent care center for evaluation: . Severe or progressive pain at the injection site(s) . Arm or leg weakness that progressively worsens or persists for longer than 8 hours . Severe or progressive redness, swelling, or discharge from the injections site(s) . Fevers, chills, nausea, or vomiting . Bowel or bladder dysfunction (i.e. inability to urinate or pass stool or difficulty controlling either)  How long does it take for the procedure to work? You should feel relief from your usual pain within the first hour. Again, this is only expected to last for several hours, at the most. Remember, you may be sore in the middle part of your back from the needles, and you must distinguish this from your usual  pain. ____________________________________________________________________________________________  ____________________________________________________________________________________________  Preparing for Procedure with Sedation  Instructions: . Oral Intake: Do not eat or drink anything for at least 8 hours prior to your procedure. . Transportation: Public transportation is not allowed. Bring an adult driver. The driver must be physically present in our waiting room before any procedure can be started. Marland Kitchen Physical Assistance: Bring an adult physically capable of assisting you, in the event you need help. This adult should keep you company at home for at least 6 hours after the procedure. . Blood Pressure Medicine: Take your blood pressure medicine with a sip of water the morning of the procedure. . Blood thinners:  . Diabetics on insulin: Notify the staff so that you can be scheduled 1st case in the morning. If your diabetes requires high dose insulin, take only  of your normal insulin dose the morning of the procedure and notify the staff that you have done so. . Preventing infections: Shower with an antibacterial soap the morning of your procedure. . Build-up your immune system: Take 1000 mg of Vitamin C with every meal (3 times a day) the day prior to your procedure. Marland Kitchen Antibiotics: Inform the staff if you have a condition or reason that requires you to take antibiotics before dental procedures. . Pregnancy: If you are pregnant, call and cancel the procedure. . Sickness: If you  have a cold, fever, or any active infections, call and cancel the procedure. . Arrival: You must be in the facility at least 30 minutes prior to your scheduled procedure. . Children: Do not bring children with you. . Dress appropriately: Bring dark clothing that you would not mind if they get stained. . Valuables: Do not bring any jewelry or valuables.  Procedure appointments are reserved for interventional  treatments only. Marland Kitchen No Prescription Refills. . No medication changes will be discussed during procedure appointments. . No disability issues will be discussed.  Remember:  Regular Business hours are:  Monday to Thursday 8:00 AM to 4:00 PM  Provider's Schedule: Milinda Pointer, MD:  Procedure days: Tuesday and Thursday 7:30 AM to 4:00 PM  Gillis Santa, MD:  Procedure days: Monday and Wednesday 7:30 AM to 4:00 PM  A prescription for Diclofenac was sent to your pharmacy.+ ____________________________________________________________________________________________

## 2018-01-31 ENCOUNTER — Other Ambulatory Visit: Payer: Self-pay

## 2018-01-31 ENCOUNTER — Ambulatory Visit (HOSPITAL_BASED_OUTPATIENT_CLINIC_OR_DEPARTMENT_OTHER): Payer: 59 | Admitting: Student in an Organized Health Care Education/Training Program

## 2018-01-31 ENCOUNTER — Encounter: Payer: Self-pay | Admitting: Student in an Organized Health Care Education/Training Program

## 2018-01-31 ENCOUNTER — Ambulatory Visit
Admission: RE | Admit: 2018-01-31 | Discharge: 2018-01-31 | Disposition: A | Discharge status: Home / Self Care | Payer: 59 | Source: Ambulatory Visit | Attending: Student in an Organized Health Care Education/Training Program | Admitting: Student in an Organized Health Care Education/Training Program

## 2018-01-31 DIAGNOSIS — M1712 Unilateral primary osteoarthritis, left knee: Secondary | ICD-10-CM

## 2018-01-31 DIAGNOSIS — Z9851 Tubal ligation status: Secondary | ICD-10-CM | POA: Diagnosis not present

## 2018-01-31 DIAGNOSIS — M25562 Pain in left knee: Secondary | ICD-10-CM | POA: Diagnosis present

## 2018-01-31 MED ORDER — LIDOCAINE HCL 2 % IJ SOLN
INTRAMUSCULAR | Status: AC
  Filled 2018-01-31: qty 20

## 2018-01-31 MED ORDER — DEXAMETHASONE SODIUM PHOSPHATE 10 MG/ML IJ SOLN
INTRAMUSCULAR | Status: AC
  Filled 2018-01-31: qty 1

## 2018-01-31 MED ORDER — DEXAMETHASONE SODIUM PHOSPHATE 10 MG/ML IJ SOLN
10.0000 mg | Freq: Once | INTRAMUSCULAR | Status: AC
  Administered 2018-01-31: 10 mg

## 2018-01-31 MED ORDER — ROPIVACAINE HCL 2 MG/ML IJ SOLN
10.0000 mL | Freq: Once | INTRAMUSCULAR | Status: AC
  Administered 2018-01-31: 10 mL

## 2018-01-31 MED ORDER — FENTANYL CITRATE (PF) 100 MCG/2ML IJ SOLN
INTRAMUSCULAR | Status: AC
  Filled 2018-01-31: qty 2

## 2018-01-31 MED ORDER — LACTATED RINGERS IV SOLN
1000.0000 mL | Freq: Once | INTRAVENOUS | Status: AC
  Administered 2018-01-31: 1000 mL via INTRAVENOUS

## 2018-01-31 MED ORDER — ROPIVACAINE HCL 2 MG/ML IJ SOLN
INTRAMUSCULAR | Status: AC
  Filled 2018-01-31: qty 10

## 2018-01-31 MED ORDER — LIDOCAINE HCL 2 % IJ SOLN
20.0000 mL | Freq: Once | INTRAMUSCULAR | Status: AC
  Administered 2018-01-31: 400 mg

## 2018-01-31 MED ORDER — FENTANYL CITRATE (PF) 100 MCG/2ML IJ SOLN
25.0000 ug | INTRAMUSCULAR | Status: DC | PRN
  Administered 2018-01-31: 25 ug via INTRAVENOUS

## 2018-01-31 NOTE — Progress Notes (Signed)
Patient's Name: Anna Mccarthy  MRN: [id number]  Referring Provider: Gillis Santa, MD  DOB: Jan 01, 1956  PCP: Maryland Pink, MD  DOS: 01/31/2018  Note by: Gillis Santa, MD  Service setting: Ambulatory outpatient  Specialty: Interventional Pain Management  Patient type: Established  Location: [facility] (AMB) Pain Management Facility  Visit type: Interventional Procedure   Primary Reason for Visit: Interventional Pain Management Treatment. CC: Knee Pain (left)  Procedure:          Anesthesia, Analgesia, Anxiolysis:  Type: Genicular Nerves Block (Superior-lateral, Superior-medial, and Inferior-medial Genicular Nerves)          CPT: 56812      Primary Purpose: Diagnostic Region: Lateral, Anterior, and Medial aspects of the knee joint, above and below the knee joint proper. Level: Superior and inferior to the knee joint. Target Area: For Genicular Nerve block(s), the targets are: the superior-lateral genicular nerve, located in the lateral distal portion of the femoral shaft as it curves to form the lateral epicondyle, in the region of the distal femoral metaphysis; the superior-medial genicular nerve, located in the medial distal portion of the femoral shaft as it curves to form the medial epicondyle; and the inferior-medial genicular nerve, located in the medial, proximal portion of the tibial shaft, as it curves to form the medial epicondyle, in the region of the proximal tibial metaphysis. Approach: Anterior, percutaneous, ipsilateral approach. Laterality: Left knee Position: Modified Fowler's position with pillows under the targeted knee(s).  Type: Moderate (Conscious) Sedation combined with Local Anesthesia Indication(s): Analgesia and Anxiety Route: Intravenous (IV) IV Access: Secured Sedation: Meaningful verbal contact was maintained at all times during the procedure  Local Anesthetic: Lidocaine 1-2%   Indications: 1. Primary osteoarthritis of left knee    Pain Score: Pre-procedure: 7  /10 Post-procedure: 0-No pain/10  Pre-op Assessment:  Anna Mccarthy is a 62 y.o. (year old), female patient, seen today for interventional treatment. She  has a past surgical history that includes Tubal ligation (1984); Abdominal hysterectomy (1996); Hernia repair (2012); Colonoscopy; Knee arthroscopy with medial menisectomy (Left, 02/18/2016); Colonoscopy with propofol (N/A, 05/30/2016); and Breast biopsy (Left, 2011). Anna Mccarthy has a current medication list which includes the following prescription(s): alprazolam, alprazolam, b complex vitamins, diclofenac, duloxetine, escitalopram, hydrochlorothiazide, omega-3 fatty acids, omeprazole, tizanidine, tramadol, turmeric, and vitamin e, and the following Facility-Administered Medications: fentanyl. Her primarily concern today is the Knee Pain (left)  Initial Vital Signs:  Pulse/HCG Rate: 77ECG Heart Rate: 66 Temp: 98.4 F (36.9 C) Resp: 18 BP: (!) 150/97 SpO2: 98 %  BMI: Estimated body mass index is 31.09 kg/m as calculated from the following:   Height as of this encounter: 5\' 2"  (1.575 m).   Weight as of this encounter: 170 lb (77.1 kg).  Risk Assessment: Allergies: Reviewed. She is allergic to percocet [oxycodone-acetaminophen] and sulfa antibiotics.  Allergy Precautions: None required Coagulopathies: Reviewed. None identified.  Blood-thinner therapy: None at this time Active Infection(s): Reviewed. None identified. Anna Mccarthy is afebrile  Site Confirmation: Anna Mccarthy was asked to confirm the procedure and laterality before marking the site Procedure checklist: Completed Consent: Before the procedure and under the influence of no sedative(s), amnesic(s), or anxiolytics, the patient was informed of the treatment options, risks and possible complications. To fulfill our ethical and legal obligations, as recommended by the American Medical Association's Code of Ethics, I have informed the patient of my clinical impression; the nature and purpose of  the treatment or procedure; the risks, benefits, and possible complications of the intervention; the alternatives,  including doing nothing; the risk(s) and benefit(s) of the alternative treatment(s) or procedure(s); and the risk(s) and benefit(s) of doing nothing. The patient was provided information about the general risks and possible complications associated with the procedure. These may include, but are not limited to: failure to achieve desired goals, infection, bleeding, organ or nerve damage, allergic reactions, paralysis, and death. In addition, the patient was informed of those risks and complications associated to the procedure, such as failure to decrease pain; infection; bleeding; organ or nerve damage with subsequent damage to sensory, motor, and/or autonomic systems, resulting in permanent pain, numbness, and/or weakness of one or several areas of the body; allergic reactions; (i.e.: anaphylactic reaction); and/or death. Furthermore, the patient was informed of those risks and complications associated with the medications. These include, but are not limited to: allergic reactions (i.e.: anaphylactic or anaphylactoid reaction(s)); adrenal axis suppression; blood sugar elevation that in diabetics may result in ketoacidosis or comma; water retention that in patients with history of congestive heart failure may result in shortness of breath, pulmonary edema, and decompensation with resultant heart failure; weight gain; swelling or edema; medication-induced neural toxicity; particulate matter embolism and blood vessel occlusion with resultant organ, and/or nervous system infarction; and/or aseptic necrosis of one or more joints. Finally, the patient was informed that Medicine is not an exact science; therefore, there is also the possibility of unforeseen or unpredictable risks and/or possible complications that may result in a catastrophic outcome. The patient indicated having understood very clearly.  We have given the patient no guarantees and we have made no promises. Enough time was given to the patient to ask questions, all of which were answered to the patient's satisfaction. Ms. Wingate has indicated that she wanted to continue with the procedure. Attestation: I, the ordering provider, attest that I have discussed with the patient the benefits, risks, side-effects, alternatives, likelihood of achieving goals, and potential problems during recovery for the procedure that I have provided informed consent. Date  Time: 01/31/2018 10:09 AM  Pre-Procedure Preparation:  Monitoring: As per clinic protocol. Respiration, ETCO2, SpO2, BP, heart rate and rhythm monitor placed and checked for adequate function Safety Precautions: Patient was assessed for positional comfort and pressure points before starting the procedure. Time-out: I initiated and conducted the "Time-out" before starting the procedure, as per protocol. The patient was asked to participate by confirming the accuracy of the "Time Out" information. Verification of the correct person, site, and procedure were performed and confirmed by me, the nursing staff, and the patient. "Time-out" conducted as per Joint Commission's Universal Protocol (UP.01.01.01). Time: 1032  Description of Procedure:          Area Prepped: Entire knee area, from mid-thigh to mid-shin, lateral, anterior, and medial aspects. Prepping solution: ChloraPrep (2% chlorhexidine gluconate and 70% isopropyl alcohol) Safety Precautions: Aspiration looking for blood return was conducted prior to all injections. At no point did we inject any substances, as a needle was being advanced. No attempts were made at seeking any paresthesias. Safe injection practices and needle disposal techniques used. Medications properly checked for expiration dates. SDV (single dose vial) medications used. Latex Allergy precautions taken.   Description of the Procedure: Protocol guidelines were followed.  The patient was placed in position over the procedure table. The target area was identified and the area prepped in the usual manner. Skin & deeper tissues infiltrated with local anesthetic. Appropriate amount of time allowed to pass for local anesthetics to take effect. The procedure needles were then advanced  to the target area. Proper needle placement secured. Negative aspiration confirmed. Solution injected in intermittent fashion, asking for systemic symptoms every 0.5cc of injectate. The needles were then removed and the area cleansed, making sure to leave some of the prepping solution back to take advantage of its long term bactericidal properties.  Vitals:   01/31/18 1050 01/31/18 1059 01/31/18 1105 01/31/18 1122  BP: (!) 151/80 140/88 (!) 138/91 136/88  Pulse: 70     Resp: 13 11 16 14   Temp:  98.7 F (37.1 C)    TempSrc:      SpO2: 97% 98% 98% 98%  Weight:      Height:        Start Time: 1032 hrs. End Time: 1044 hrs. Materials:  Needle(s) Type: Regular needle Gauge: 22G Length: 3.5-in Medication(s): Please see orders for medications and dosing details. 6 cc solution made of 5 cc of 0.2% ropivacaine and 1 cc of Decadron, 10 mg/cc. 2 cc injected at each level above Imaging Guidance (Non-Spinal):          Type of Imaging Technique: Fluoroscopy Guidance (Non-Spinal) Indication(s): Assistance in needle guidance and placement for procedures requiring needle placement in or near specific anatomical locations not easily accessible without such assistance. Exposure Time: Please see nurses notes. Contrast: Before injecting any contrast, we confirmed that the patient did not have an allergy to iodine, shellfish, or radiological contrast. Once satisfactory needle placement was completed at the desired level, radiological contrast was injected. Contrast injected under live fluoroscopy. No contrast complications. See chart for type and volume of contrast used. Fluoroscopic Guidance: I was  personally present during the use of fluoroscopy. "Tunnel Vision Technique" used to obtain the best possible view of the target area. Parallax error corrected before commencing the procedure. "Direction-depth-direction" technique used to introduce the needle under continuous pulsed fluoroscopy. Once target was reached, antero-posterior, oblique, and lateral fluoroscopic projection used confirm needle placement in all planes. Images permanently stored in EMR. Interpretation: I personally interpreted the imaging intraoperatively. Adequate needle placement confirmed in multiple planes. Appropriate spread of contrast into desired area was observed. No evidence of afferent or efferent intravascular uptake. Permanent images saved into the patient's record.  Antibiotic Prophylaxis:   Anti-infectives (From admission, onward)   None     Indication(s): None identified  Post-operative Assessment:  Post-procedure Vital Signs:  Pulse/HCG Rate: 7070 Temp: 98.7 F (37.1 C) Resp: 14 BP: 136/88 SpO2: 98 %  EBL: None  Complications: No immediate post-treatment complications observed by team, or reported by patient.  Note: The patient tolerated the entire procedure well. A repeat set of vitals were taken after the procedure and the patient was kept under observation following institutional policy, for this type of procedure. Post-procedural neurological assessment was performed, showing return to baseline, prior to discharge. The patient was provided with post-procedure discharge instructions, including a section on how to identify potential problems. Should any problems arise concerning this procedure, the patient was given instructions to immediately contact us, at any time, without hesitation. In any case, we plan to contact the patient by telephone for a follow-up status report regarding this interventional procedure.  Comments:  No additional relevant information. 5 out of 5 strength bilateral lower  extremity: Plantar flexion, dorsiflexion, knee flexion, knee extension. Plan of Care    Imaging Orders     DG C-Arm 1-60 Min-No Report Procedure Orders    No procedure(s) ordered today    Medications ordered for procedure: Meds ordered this encounter  Medications  .  fentaNYL (SUBLIMAZE) injection 25-100 mcg    Make sure Narcan is available in the pyxis when using this medication. In the event of respiratory depression (RR< 8/min): Titrate NARCAN (naloxone) in increments of 0.1 to 0.2 mg IV at 2-3 minute intervals, until desired degree of reversal.  . lactated ringers infusion 1,000 mL  . lidocaine (XYLOCAINE) 2 % (with pres) injection 400 mg  . ropivacaine (PF) 2 mg/mL (0.2%) (NAROPIN) injection 10 mL  . dexamethasone (DECADRON) injection 10 mg   Medications administered: We administered fentaNYL, lactated ringers, lidocaine, ropivacaine (PF) 2 mg/mL (0.2%), and dexamethasone.  See the medical record for exact dosing, route, and time of administration.  New Prescriptions   No medications on file   Disposition: Discharge home  Discharge Date & Time: 01/31/2018; 1122 hrs.   Physician-requested Follow-up: Return in about 2 weeks (around 02/14/2018) for Post Procedure Evaluation.  Future Appointments  Date Time Provider [place]  02/13/2018 12:15 PM Gillis Santa, MD [facility] None  02/14/2018  4:45 PM Blythe Stanford, PT [facility] None  02/27/2018  9:45 AM Cleophus Molt E, PTA [facility] None  03/01/2018  2:15 PM Cleophus Molt E, PTA [facility] None  03/06/2018 11:15 AM Cleophus Molt E, PTA [facility] None  03/08/2018 12:15 PM Larae Grooms, PTA [facility] None  03/12/2018  1:00 PM Rissell, Lake Bells, PT [facility] None   Primary Care Physician: Maryland Pink, MD Location: [facility] Note by: Gillis Santa, MD Date: 01/31/2018; Time: 12:12 PM  Disclaimer:  Medicine is not an exact science. The only guarantee in medicine is that nothing is guaranteed. It  is important to note that the decision to proceed with this intervention was based on the information collected from the patient. The Data and conclusions were drawn from the patient's questionnaire, the interview, and the physical examination. Because the information was provided in large part by the patient, it cannot be guaranteed that it has not been purposely or unconsciously manipulated. Every effort has been made to obtain as much relevant data as possible for this evaluation. It is important to note that the conclusions that lead to this procedure are derived in large part from the available data. Always take into account that the treatment will also be dependent on availability of resources and existing treatment guidelines, considered by other Pain Management Practitioners as being common knowledge and practice, at the time of the intervention. For Medico-Legal purposes, it is also important to point out that variation in procedural techniques and pharmacological choices are the acceptable norm. The indications, contraindications, technique, and results of the above procedure should only be interpreted and judged by a Board-Certified Interventional Pain Specialist with extensive familiarity and expertise in the same exact procedure and technique.

## 2018-01-31 NOTE — Patient Instructions (Signed)

## 2018-01-31 NOTE — Progress Notes (Signed)
Safety precautions to be maintained throughout the outpatient stay will include: orient to surroundings, keep bed in low position, maintain call bell within reach at all times, provide assistance with transfer out of bed and ambulation.  

## 2018-02-01 ENCOUNTER — Telehealth: Payer: Self-pay

## 2018-02-01 NOTE — Telephone Encounter (Signed)
Pt was called and message was left.  °

## 2018-02-13 ENCOUNTER — Ambulatory Visit
Payer: 59 | Attending: Student in an Organized Health Care Education/Training Program | Admitting: Student in an Organized Health Care Education/Training Program

## 2018-02-13 ENCOUNTER — Other Ambulatory Visit: Payer: Self-pay

## 2018-02-13 ENCOUNTER — Encounter: Payer: Self-pay | Admitting: Student in an Organized Health Care Education/Training Program

## 2018-02-13 VITALS — BP 141/97 | HR 75 | Temp 98.1°F | Resp 16 | Ht 62.0 in | Wt 170.0 lb

## 2018-02-13 DIAGNOSIS — M797 Fibromyalgia: Secondary | ICD-10-CM | POA: Insufficient documentation

## 2018-02-13 DIAGNOSIS — M23307 Other meniscus derangements, unspecified meniscus, left knee: Secondary | ICD-10-CM

## 2018-02-13 DIAGNOSIS — G894 Chronic pain syndrome: Secondary | ICD-10-CM | POA: Insufficient documentation

## 2018-02-13 DIAGNOSIS — M1712 Unilateral primary osteoarthritis, left knee: Secondary | ICD-10-CM

## 2018-02-13 DIAGNOSIS — Z79899 Other long term (current) drug therapy: Secondary | ICD-10-CM | POA: Diagnosis not present

## 2018-02-13 DIAGNOSIS — G8929 Other chronic pain: Secondary | ICD-10-CM

## 2018-02-13 DIAGNOSIS — M25562 Pain in left knee: Secondary | ICD-10-CM

## 2018-02-13 NOTE — Progress Notes (Signed)
Patient's Name: Anna Mccarthy  MRN: [id number]  Referring Provider: Maryland Pink, MD  DOB: 1956/01/01  PCP: Maryland Pink, MD  DOS: 02/13/2018  Note by: Gillis Santa, MD  Service setting: Ambulatory outpatient  Specialty: Interventional Pain Management  Location: [facility] (AMB) Pain Management Facility    Patient type: Established   Primary Reason(s) for Visit: Encounter for post-procedure evaluation of chronic illness with mild to moderate exacerbation CC: Knee Pain (left)  HPI  Anna Mccarthy is a 62 y.o. year old, female patient, who comes today for a post-procedure evaluation. She has Varicose veins of bilateral lower extremities with pain; Chronic venous insufficiency; Pain in limb; Swelling of limb; DJD (degenerative joint disease); and Venous ulcer (HCC) on their problem list. Her primarily concern today is the Knee Pain (left)  Pain Assessment: Location: Left Knee Radiating: moves up to left hip but not as bad as before procedure/diclofenac Onset: More than a month ago Duration: Chronic pain Quality: Jabbing(but mainly with activity) Severity: 4 /10 (subjective, self-reported pain score)  Note: Reported level is compatible with observation.                         When using our objective Pain Scale, levels between 6 and 10/10 are said to belong in an emergency room, as it progressively worsens from a 6/10, described as severely limiting, requiring emergency care not usually available at an outpatient pain management facility. At a 6/10 level, communication becomes difficult and requires great effort. Assistance to reach the emergency department may be required. Facial flushing and profuse sweating along with potentially dangerous increases in heart rate and blood pressure will be evident. Effect on ADL:   Timing: Intermittent Modifying factors: procedures, diclofenac BP: (!) 141/97  HR: 75  Anna Mccarthy comes in today for post-procedure evaluation after the treatment done on  02/01/2018.  Further details on both, my assessment(s), as well as the proposed treatment plan, please see below.  Post-Procedure Assessment  01/31/2018 Procedure: LEFT KNEE GNB  Influential Factors: BMI: 31.09 kg/m Intra-procedural challenges: None observed.         Assessment challenges: None detected.              Reported side-effects: None.        Post-procedural adverse reactions or complications: None reported         Sedation: Please see nurses note. When no sedatives are used, the analgesic levels obtained are directly associated to the effectiveness of the local anesthetics. However, when sedation is provided, the level of analgesia obtained during the initial 1 hour following the intervention, is believed to be the result of a combination of factors. These factors may include, but are not limited to: 1. The effectiveness of the local anesthetics used. 2. The effects of the analgesic(s) and/or anxiolytic(s) used. 3. The degree of discomfort experienced by the patient at the time of the procedure. 4. The patients ability and reliability in recalling and recording the events. 5. The presence and influence of possible secondary gains and/or psychosocial factors. Reported result: Relief experienced during the 1st hour after the procedure: 100 % (Ultra-Short Term Relief)            Interpretative annotation: Clinically appropriate result. Analgesia during this period is likely to be Local Anesthetic and/or IV Sedative (Analgesic/Anxiolytic) related.          Effects of local anesthetic: The analgesic effects attained during this period are directly associated to the localized infiltration  of local anesthetics and therefore cary significant diagnostic value as to the etiological location, or anatomical origin, of the pain. Expected duration of relief is directly dependent on the pharmacodynamics of the local anesthetic used. Long-acting (4-6 hours) anesthetics used.  Reported result: Relief  during the next 4 to 6 hour after the procedure: 100 % (Short-Term Relief)            Interpretative annotation: Clinically appropriate result. Analgesia during this period is likely to be Local Anesthetic-related.          Long-term benefit: Defined as the period of time past the expected duration of local anesthetics (1 hour for short-acting and 4-6 hours for long-acting). With the possible exception of prolonged sympathetic blockade from the local anesthetics, benefits during this period are typically attributed to, or associated with, other factors such as analgesic sensory neuropraxia, antiinflammatory effects, or beneficial biochemical changes provided by agents other than the local anesthetics.  Reported result: Extended relief following procedure: 50 %(100% relief lasted 1 day) (Long-Term Relief)            Interpretative annotation: Clinically possible results. Good relief. No permanent benefit expected. Inflammation plays a part in the etiology to the pain.          Current benefits: Defined as reported results that persistent at this point in time.   Analgesia: 25-50 %            Function: Somewhat improved ROM: Somewhat improved Interpretative annotation: Recurrence of symptoms. No permanent benefit expected. Effective diagnostic intervention.          Interpretation: Results would suggest a successful diagnostic intervention.                  Plan:  Repeat treatment or therapy and compare extent and duration of benefits.                Laboratory Chemistry  Inflammation Markers (CRP: Acute Phase) (ESR: Chronic Phase) No results found for: CRP, ESRSEDRATE, LATICACIDVEN                       Rheumatology Markers No results found for: RF, ANA, LABURIC, URICUR, LYMEIGGIGMAB, LYMEABIGMQN, HLAB27                      Renal Function Markers Lab Results  Component Value Date   BUN 18 10/07/2014   CREATININE 0.86 10/07/2014   GFRAA >60 10/07/2014   GFRNONAA >60 10/07/2014                              Hepatic Function Markers Lab Results  Component Value Date   AST 24 10/07/2014   ALT 20 10/07/2014   ALBUMIN 4.4 10/07/2014   ALKPHOS 78 10/07/2014                        Electrolytes Lab Results  Component Value Date   NA 135 10/07/2014   K 3.7 10/07/2014   CL 99 (L) 10/07/2014   CALCIUM 9.0 10/07/2014                        Neuropathy Markers No results found for: VITAMINB12, FOLATE, HGBA1C, HIV                      Bone Pathology Markers No results found for:  [place], [id number], [id number], [id number], 25OHVITD1, 25OHVITD2, 25OHVITD3, TESTOFREE, TESTOSTERONE                       Coagulation Parameters Lab Results  Component Value Date   PLT 387 10/07/2014                        Cardiovascular Markers Lab Results  Component Value Date   TROPONINI <0.03 10/07/2014   HGB 13.5 10/07/2014   HCT 39.6 10/07/2014                         CA Markers No results found for: CEA, CA125, LABCA2                      Note: Lab results reviewed.  Recent Diagnostic Imaging Results  DG C-Arm 1-60 Min-No Report Fluoroscopy was utilized by the requesting physician.  No radiographic  interpretation.   Complexity Note: Imaging results reviewed. Results shared with Anna Mccarthy, using Layman's terms.                         Meds   Current Outpatient Medications:  .  ALPRAZolam (XANAX) 0.25 MG tablet, TK 1 T PO BID PRN, Disp: , Rfl: 0 .  diclofenac (VOLTAREN) 75 MG EC tablet, Take 1 tablet (75 mg total) by mouth 2 (two) times daily after a meal., Disp: 60 tablet, Rfl: 1 .  DULoxetine (CYMBALTA) 20 MG capsule, 20 mg 2 (two) times daily. , Disp: , Rfl:  .  escitalopram (LEXAPRO) 20 MG tablet, Take 20 mg by mouth at bedtime. , Disp: , Rfl:  .  hydrochlorothiazide (HYDRODIURIL) 25 MG tablet, Take 25 mg by mouth daily., Disp: , Rfl:  .  omeprazole (PRILOSEC) 40 MG capsule, Take 40 mg by mouth daily., Disp: , Rfl:  .  tiZANidine (ZANAFLEX) 2 MG tablet, Take by mouth  at bedtime., Disp: , Rfl:  .  traMADol (ULTRAM) 50 MG tablet, Take by mouth., Disp: , Rfl:  .  ALPRAZolam (XANAX) 0.5 MG tablet, Take 0.5 mg by mouth at bedtime. , Disp: , Rfl: 2 .  b complex vitamins tablet, Take 1 tablet by mouth daily., Disp: , Rfl:  .  Omega-3 Fatty Acids (FISH OIL PO), Take by mouth., Disp: , Rfl:  .  Turmeric 1053 MG TABS, Take by mouth., Disp: , Rfl:  .  vitamin E 100 UNIT capsule, Take 100 Units by mouth at bedtime. , Disp: , Rfl:   ROS  Constitutional: Denies any fever or chills Gastrointestinal: No reported hemesis, hematochezia, vomiting, or acute GI distress Musculoskeletal: Denies any acute onset joint swelling, redness, loss of ROM, or weakness Neurological: No reported episodes of acute onset apraxia, aphasia, dysarthria, agnosia, amnesia, paralysis, loss of coordination, or loss of consciousness  Allergies  Anna Mccarthy is allergic to percocet [oxycodone-acetaminophen] and sulfa antibiotics.  PFSH  Drug: Anna Mccarthy  reports that she does not use drugs. Alcohol:  reports that she drinks about 2.0 standard drinks of alcohol per week. Tobacco:  reports that she has never smoked. She has never used smokeless tobacco. Medical:  has a past medical history of Allergy, Anxiety, Arthritis, Bronchitis, Cancer ([place]), GERD (gastroesophageal reflux disease), and Hypertension. Surgical: Anna Mccarthy  has a past surgical history that includes Tubal ligation (1984); Abdominal hysterectomy (1996); Hernia repair (2012); Colonoscopy; Knee arthroscopy with medial menisectomy (Left,  02/18/2016); Colonoscopy with propofol (N/A, 05/30/2016); and Breast biopsy (Left, 2011). Family: family history includes Breast cancer in her cousin and maternal aunt; Ovarian cancer in her sister.  Constitutional Exam  General appearance: Well nourished, well developed, and well hydrated. In no apparent acute distress Vitals:   02/13/18 1142  BP: (!) 141/97  Pulse: 75  Resp: 16  Temp: 98.1 F (36.7  C)  TempSrc: Oral  SpO2: 100%  Weight: 170 lb (77.1 kg)  Height: '5\' 2"'$  (1.575 m)   BMI Assessment: Estimated body mass index is 31.09 kg/m as calculated from the following:   Height as of this encounter: '5\' 2"'$  (1.575 m).   Weight as of this encounter: 170 lb (77.1 kg).  BMI interpretation table: BMI level Category Range association with higher incidence of chronic pain  <18 kg/m2 Underweight   18.5-24.9 kg/m2 Ideal body weight   25-29.9 kg/m2 Overweight Increased incidence by 20%  30-34.9 kg/m2 Obese (Class I) Increased incidence by 68%  35-39.9 kg/m2 Severe obesity (Class II) Increased incidence by 136%  >40 kg/m2 Extreme obesity (Class III) Increased incidence by 254%   Patient's current BMI Ideal Body weight  Body mass index is 31.09 kg/m. Ideal body weight: 50.1 kg (110 lb 7.2 oz) Adjusted ideal body weight: 60.9 kg (134 lb 4.3 oz)   BMI Readings from Last 4 Encounters:  02/13/18 31.09 kg/m  01/31/18 31.09 kg/m  01/11/18 30.11 kg/m  04/03/17 30.79 kg/m   Wt Readings from Last 4 Encounters:  02/13/18 170 lb (77.1 kg)  01/31/18 170 lb (77.1 kg)  01/11/18 170 lb (77.1 kg)  04/03/17 173 lb 12.8 oz (78.8 kg)  Psych/Mental status: Alert, oriented x 3 (person, place, & time)       Eyes: PERLA Respiratory: No evidence of acute respiratory distress  Cervical Spine Area Exam  Skin & Axial Inspection: No masses, redness, edema, swelling, or associated skin lesions Alignment: Symmetrical Functional ROM: Unrestricted ROM      Stability: No instability detected Muscle Tone/Strength: Functionally intact. No obvious neuro-muscular anomalies detected. Sensory (Neurological): Unimpaired Palpation: No palpable anomalies              Upper Extremity (UE) Exam    Side: Right upper extremity  Side: Left upper extremity  Skin & Extremity Inspection: Skin color, temperature, and hair growth are WNL. No peripheral edema or cyanosis. No masses, redness, swelling, asymmetry, or  associated skin lesions. No contractures.  Skin & Extremity Inspection: Skin color, temperature, and hair growth are WNL. No peripheral edema or cyanosis. No masses, redness, swelling, asymmetry, or associated skin lesions. No contractures.  Functional ROM: Unrestricted ROM          Functional ROM: Unrestricted ROM          Muscle Tone/Strength: Functionally intact. No obvious neuro-muscular anomalies detected.  Muscle Tone/Strength: Functionally intact. No obvious neuro-muscular anomalies detected.  Sensory (Neurological): Unimpaired          Sensory (Neurological): Unimpaired          Palpation: No palpable anomalies              Palpation: No palpable anomalies              Provocative Test(s):  Phalen's test: deferred Tinel's test: deferred Apley's scratch test (touch opposite shoulder):  Action 1 (Across chest): deferred Action 2 (Overhead): deferred Action 3 (LB reach): deferred   Provocative Test(s):  Phalen's test: deferred Tinel's test: deferred Apley's scratch test (touch opposite shoulder):  Action 1 (Across chest): deferred Action 2 (Overhead): deferred Action 3 (LB reach): deferred    Thoracic Spine Area Exam  Skin & Axial Inspection: No masses, redness, or swelling Alignment: Symmetrical Functional ROM: Unrestricted ROM Stability: No instability detected Muscle Tone/Strength: Functionally intact. No obvious neuro-muscular anomalies detected. Sensory (Neurological): Unimpaired Muscle strength & Tone: No palpable anomalies  Lumbar Spine Area Exam  Skin & Axial Inspection: No masses, redness, or swelling Alignment: Symmetrical Functional ROM: Unrestricted ROM       Stability: No instability detected Muscle Tone/Strength: Functionally intact. No obvious neuro-muscular anomalies detected. Sensory (Neurological): Unimpaired Palpation: No palpable anomalies       Provocative Tests: Hyperextension/rotation test: deferred today       Lumbar quadrant test (Kemp's test):  deferred today       Lateral bending test: deferred today       Patrick's Maneuver: deferred today                   FABER test: deferred today                   S-I anterior distraction/compression test: deferred today         S-I lateral compression test: deferred today         S-I Thigh-thrust test: deferred today         S-I Gaenslen's test: deferred today          Gait & Posture Assessment  Ambulation: Unassisted Gait: Relatively normal for age and body habitus Posture: WNL   Lower Extremity Exam    Side: Right lower extremity  Side: Left lower extremity  Stability: No instability observed          Stability: No instability observed          Skin & Extremity Inspection: Skin color, temperature, and hair growth are WNL. No peripheral edema or cyanosis. No masses, redness, swelling, asymmetry, or associated skin lesions. No contractures.  Skin & Extremity Inspection: Skin color, temperature, and hair growth are WNL. No peripheral edema or cyanosis. No masses, redness, swelling, asymmetry, or associated skin lesions. No contractures.  Functional ROM: Unrestricted ROM                  Functional ROM: Decreased ROM for knee joint          Muscle Tone/Strength: Functionally intact. No obvious neuro-muscular anomalies detected.  Muscle Tone/Strength: Functionally intact. No obvious neuro-muscular anomalies detected.  Sensory (Neurological): Unimpaired  Sensory (Neurological): Arthropathic arthralgia  Palpation: No palpable anomalies  Palpation: No palpable anomalies   Assessment  Primary Diagnosis & Pertinent Problem List: The primary encounter diagnosis was Primary osteoarthritis of left knee. Diagnoses of Meniscus degeneration, left, Chronic pain of left knee, Fibromyalgia, and Chronic pain syndrome were also pertinent to this visit.  Status Diagnosis  Responding Responding Responding 1. Primary osteoarthritis of left knee   2. Meniscus degeneration, left   3. Chronic pain of left  knee   4. Fibromyalgia   5. Chronic pain syndrome      General Recommendations: The pain condition that the patient suffers from is best treated with a multidisciplinary approach that involves an increase in physical activity to prevent de-conditioning and worsening of the pain cycle, as well as psychological counseling (formal and/or informal) to address the co-morbid psychological affects of pain. Treatment will often involve judicious use of pain medications and interventional procedures to decrease the pain, allowing the patient to participate in the physical  activity that will ultimately produce long-lasting pain reductions. The goal of the multidisciplinary approach is to return the patient to a higher level of overall function and to restore their ability to perform activities of daily living.  62 year old female with a history of left knee pain status post left knee meniscus repair in 2017 who has tried various oral therapies including NSAIDs as well as left intra-articular steroid injections as well as left intra-articular Visco supplementation to help with her knee symptoms. Visco supplementation was not helpful but the patient did obtain short-term benefit with intra-articular steroid therapy.  She has been told by her orthopedist that she should limit her intra-articular steroid injections.  In regards to her left persistent knee pain secondary to severe tricompartmental osteoarthritis in the context of having left knee meniscus repair, patient follows up status post left knee genicular nerve block #1.  Patient endorses greater than 50% pain relief for approximately 2 weeks after nerve block with detailed results above.  Patient did find benefit with a genicular nerve block and states that it helped to reduce her pain levels and allowed her to ambulate for an extended period of time without as much pain.  Patient is also obtaining benefit from diclofenac 75 mg twice daily.  She has 1 more  month of diclofenac remaining.  I instructed her to discontinue diclofenac after this next month's prescription is complete.  She will be starting aquatic therapy next week which I encouraged her to follow through with.  We will get the patient scheduled for left knee genicular nerve block #2 followed possibly by cooled radio frequency ablation.  Regards to medication management, patient will not be a candidate for chronic opioid therapy given her chronic use of Xanax.  We will focus on non-opioid analgesics as well as interventional therapies.   Plan of Care   Lab-work, procedure(s), and/or referral(s): Orders Placed This Encounter  Procedures  . GENICULAR NERVE BLOCK    Provider-requested follow-up: Return in about 20 days (around 03/05/2018) for Procedure.  Time Note: Greater than 50% of the 25 minute(s) of face-to-face time spent with Anna Mccarthy, was spent in counseling/coordination of care regarding: Anna Mccarthy primary cause of pain, the treatment plan, treatment alternatives, the risks and possible complications of proposed treatment, medication side effects, going over the informed consent, the results, interpretation and significance of  her recent diagnostic interventional treatment(s), realistic expectations and the goals of pain management (increased in functionality).   Future Appointments  Date Time Provider [place]  02/14/2018  4:45 PM Blythe Stanford, PT [facility] None  02/27/2018  9:45 AM Cleophus Molt E, PTA [facility] None  03/01/2018  2:15 PM Cleophus Molt E, PTA [facility] None  03/06/2018 11:15 AM Cleophus Molt E, PTA [facility] None  03/08/2018 12:15 PM Larae Grooms, PTA [facility] None  03/12/2018  1:00 PM Rissell, Lake Bells, PT [facility] None    Primary Care Physician: Maryland Pink, MD Location: [facility] Note by: Gillis Santa, M.D Date: 02/13/2018; Time: 3:38 PM  Patient Instructions   Moderate Conscious Sedation, Adult Sedation  is the use of medicines to promote relaxation and relieve discomfort and anxiety. Moderate conscious sedation is a type of sedation. Under moderate conscious sedation, you are less alert than normal, but you are still able to respond to instructions, touch, or both. Moderate conscious sedation is used during short medical and dental procedures. It is milder than deep sedation, which is a type of sedation under which you cannot be easily  woken up. It is also milder than general anesthesia, which is the use of medicines to make you unconscious. Moderate conscious sedation allows you to return to your regular activities sooner. Tell a health care provider about:  Any allergies you have.  All medicines you are taking, including vitamins, herbs, eye drops, creams, and over-the-counter medicines.  Use of steroids (by mouth or creams).  Any problems you or family members have had with sedatives and anesthetic medicines.  Any blood disorders you have.  Any surgeries you have had.  Any medical conditions you have, such as sleep apnea.  Whether you are pregnant or may be pregnant.  Any use of cigarettes, alcohol, marijuana, or street drugs. What are the risks? Generally, this is a safe procedure. However, problems may occur, including:  Getting too much medicine (oversedation).  Nausea.  Allergic reaction to medicines.  Trouble breathing. If this happens, a breathing tube may be used to help with breathing. It will be removed when you are awake and breathing on your own.  Heart trouble.  Lung trouble.  What happens before the procedure? Staying hydrated Follow instructions from your health care provider about hydration, which may include:  Up to 2 hours before the procedure - you may continue to drink clear liquids, such as water, clear fruit juice, black coffee, and plain tea.  Eating and drinking restrictions Follow instructions from your health care provider about eating and  drinking, which may include:  8 hours before the procedure - stop eating heavy meals or foods such as meat, fried foods, or fatty foods.  6 hours before the procedure - stop eating light meals or foods, such as toast or cereal.  6 hours before the procedure - stop drinking milk or drinks that contain milk.  2 hours before the procedure - stop drinking clear liquids.  Medicine  Ask your health care provider about:  Changing or stopping your regular medicines. This is especially important if you are taking diabetes medicines or blood thinners.  Taking medicines such as aspirin and ibuprofen. These medicines can thin your blood. Do not take these medicines before your procedure if your health care provider instructs you not to.  Tests and exams  You will have a physical exam.  You may have blood tests done to show: ? How well your kidneys and liver are working. ? How well your blood can clot. General instructions  Plan to have someone take you home from the hospital or clinic.  If you will be going home right after the procedure, plan to have someone with you for 24 hours. What happens during the procedure?  An IV tube will be inserted into one of your veins.  Medicine to help you relax (sedative) will be given through the IV tube.  The medical or dental procedure will be performed. What happens after the procedure?  Your blood pressure, heart rate, breathing rate, and blood oxygen level will be monitored often until the medicines you were given have worn off.  Do not drive for 24 hours. This information is not intended to replace advice given to you by your health care provider. Make sure you discuss any questions you have with your health care provider. Document Released: 03/08/2001 Document Revised: 11/17/2015 Document Reviewed: 10/03/2015 Elsevier Interactive Patient Education  2018 [place]  What are the risk, side effects and  possible complications? Generally speaking, most procedures are safe.  However, with any procedure there are risks, side  effects, and the possibility of complications.  The risks and complications are dependent upon the sites that are lesioned, or the type of nerve block to be performed.  The closer the procedure is to the spine, the more serious the risks are.  Great care is taken when placing the radio frequency needles, block needles or lesioning probes, but sometimes complications can occur. 1. Infection: Any time there is an injection through the skin, there is a risk of infection.  This is why sterile conditions are used for these blocks.  There are four possible types of infection. 1. Localized skin infection. 2. Central Nervous System Infection-This can be in the form of Meningitis, which can be deadly. 3. Epidural Infections-This can be in the form of an epidural abscess, which can cause pressure inside of the spine, causing compression of the spinal cord with subsequent paralysis. This would require an emergency surgery to decompress, and there are no guarantees that the patient would recover from the paralysis. 4. Discitis-This is an infection of the intervertebral discs.  It occurs in about 1% of discography procedures.  It is difficult to treat and it may lead to surgery.        2. Pain: the needles have to go through skin and soft tissues, will cause soreness.       3. Damage to internal structures:  The nerves to be lesioned may be near blood vessels or    other nerves which can be potentially damaged.       4. Bleeding: Bleeding is more common if the patient is taking blood thinners such as  aspirin, Coumadin, Ticiid, Plavix, etc., or if he/she have some genetic predisposition  such as hemophilia. Bleeding into the spinal canal can cause compression of the spinal  cord with subsequent paralysis.  This would require an emergency surgery to  decompress and there are no guarantees that the  patient would recover from the  paralysis.       5. Pneumothorax:  Puncturing of a lung is a possibility, every time a needle is introduced in  the area of the chest or upper back.  Pneumothorax refers to free air around the  collapsed lung(s), inside of the thoracic cavity (chest cavity).  Another two possible  complications related to a similar event would include: Hemothorax and Chylothorax.   These are variations of the Pneumothorax, where instead of air around the collapsed  lung(s), you may have blood or chyle, respectively.       6. Spinal headaches: They may occur with any procedures in the area of the spine.       7. Persistent CSF (Cerebro-Spinal Fluid) leakage: This is a rare problem, but may occur  with prolonged intrathecal or epidural catheters either due to the formation of a fistulous  track or a dural tear.       8. Nerve damage: By working so close to the spinal cord, there is always a possibility of  nerve damage, which could be as serious as a permanent spinal cord injury with  paralysis.       9. Death:  Although rare, severe deadly allergic reactions known as "Anaphylactic  reaction" can occur to any of the medications used.      10. Worsening of the symptoms:  We can always make thing worse.  What are the chances of something like this happening? Chances of any of this occuring are extremely low.  By statistics, you have more of a chance of  getting killed in a motor vehicle accident: while driving to the hospital than any of the above occurring .  Nevertheless, you should be aware that they are possibilities.  In general, it is similar to taking a shower.  Everybody knows that you can slip, hit your head and get killed.  Does that mean that you should not shower again?  Nevertheless always keep in mind that statistics do not mean anything if you happen to be on the wrong side of them.  Even if a procedure has a 1 (one) in a 1,000,000 (million) chance of going wrong, it you happen to  be that one..Also, keep in mind that by statistics, you have more of a chance of having something go wrong when taking medications.  Who should not have this procedure? If you are on a blood thinning medication (e.g. Coumadin, Plavix, see list of "Blood Thinners"), or if you have an active infection going on, you should not have the procedure.  If you are taking any blood thinners, please inform your physician.  How should I prepare for this procedure?  Do not eat or drink anything at least six hours prior to the procedure.  Bring a driver with you .  It cannot be a taxi.  Come accompanied by an adult that can drive you back, and that is strong enough to help you if your legs get weak or numb from the local anesthetic.  Take all of your medicines the morning of the procedure with just enough water to swallow them.  If you have diabetes, make sure that you are scheduled to have your procedure done first thing in the morning, whenever possible.  If you have diabetes, take only half of your insulin dose and notify our nurse that you have done so as soon as you arrive at the clinic.  If you are diabetic, but only take blood sugar pills (oral hypoglycemic), then do not take them on the morning of your procedure.  You may take them after you have had the procedure.  Do not take aspirin or any aspirin-containing medications, at least eleven (11) days prior to the procedure.  They may prolong bleeding.  Wear loose fitting clothing that may be easy to take off and that you would not mind if it got stained with Betadine or blood.  Do not wear any jewelry or perfume  Remove any nail coloring.  It will interfere with some of our monitoring equipment.  NOTE: Remember that this is not meant to be interpreted as a complete list of all possible complications.  Unforeseen problems may occur.  BLOOD THINNERS The following drugs contain aspirin or other products, which can cause increased bleeding  during surgery and should not be taken for 2 weeks prior to and 1 week after surgery.  If you should need take something for relief of minor pain, you may take acetaminophen which is found in Tylenol,m Datril, Anacin-3 and Panadol. It is not blood thinner. The products listed below are.  Do not take any of the products listed below in addition to any listed on your instruction sheet.  A.P.C or A.P.C with Codeine Codeine Phosphate Capsules #3 Ibuprofen Ridaura  ABC compound Congesprin Imuran rimadil  Advil Cope Indocin Robaxisal  Alka-Seltzer Effervescent Pain Reliever and Antacid Coricidin or Coricidin-D  Indomethacin Rufen  Alka-Seltzer plus Cold Medicine Cosprin Ketoprofen S-A-C Tablets  Anacin Analgesic Tablets or Capsules Coumadin Korlgesic Salflex  Anacin Extra Strength Analgesic tablets or capsules CP-2 Tablets Lanoril  Salicylate  Anaprox Cuprimine Capsules Levenox Salocol  Anexsia-D Dalteparin Magan Salsalate  Anodynos Darvon compound Magnesium Salicylate Sine-off  Ansaid Dasin Capsules Magsal Sodium Salicylate  Anturane Depen Capsules Marnal Soma  APF Arthritis pain formula Dewitt's Pills Measurin Stanback  Argesic Dia-Gesic Meclofenamic Sulfinpyrazone  Arthritis Bayer Timed Release Aspirin Diclofenac Meclomen Sulindac  Arthritis pain formula Anacin Dicumarol Medipren Supac  Analgesic (Safety coated) Arthralgen Diffunasal Mefanamic Suprofen  Arthritis Strength Bufferin Dihydrocodeine Mepro Compound Suprol  Arthropan liquid Dopirydamole Methcarbomol with Aspirin Synalgos  ASA tablets/Enseals Disalcid Micrainin Tagament  Ascriptin Doan's Midol Talwin  Ascriptin A/D Dolene Mobidin Tanderil  Ascriptin Extra Strength Dolobid Moblgesic Ticlid  Ascriptin with Codeine Doloprin or Doloprin with Codeine Momentum Tolectin  Asperbuf Duoprin Mono-gesic Trendar  Aspergum Duradyne Motrin or Motrin IB Triminicin  Aspirin plain, buffered or enteric coated Durasal Myochrisine Trigesic  Aspirin  Suppositories Easprin Nalfon Trillsate  Aspirin with Codeine Ecotrin Regular or Extra Strength Naprosyn Uracel  Atromid-S Efficin Naproxen Ursinus  Auranofin Capsules Elmiron Neocylate Vanquish  Axotal Emagrin Norgesic Verin  Azathioprine Empirin or Empirin with Codeine Normiflo Vitamin E  Azolid Emprazil Nuprin Voltaren  Bayer Aspirin plain, buffered or children's or timed BC Tablets or powders Encaprin Orgaran Warfarin Sodium  Buff-a-Comp Enoxaparin Orudis Zorpin  Buff-a-Comp with Codeine Equegesic Os-Cal-Gesic   Buffaprin Excedrin plain, buffered or Extra Strength Oxalid   Bufferin Arthritis Strength Feldene Oxphenbutazone   Bufferin plain or Extra Strength Feldene Capsules Oxycodone with Aspirin   Bufferin with Codeine Fenoprofen Fenoprofen Pabalate or Pabalate-SF   Buffets II Flogesic Panagesic   Buffinol plain or Extra Strength Florinal or Florinal with Codeine Panwarfarin   Buf-Tabs Flurbiprofen Penicillamine   Butalbital Compound Four-way cold tablets Penicillin   Butazolidin Fragmin Pepto-Bismol   Carbenicillin Geminisyn Percodan   Carna Arthritis Reliever Geopen Persantine   Carprofen Gold's salt Persistin   Chloramphenicol Goody's Phenylbutazone   Chloromycetin Haltrain Piroxlcam   Clmetidine heparin Plaquenil   Cllnoril Hyco-pap Ponstel   Clofibrate Hydroxy chloroquine Propoxyphen         Before stopping any of these medications, be sure to consult the physician who ordered them.  Some, such as Coumadin (Warfarin) are ordered to prevent or treat serious conditions such as "deep thrombosis", "pumonary embolisms", and other heart problems.  The amount of time that you may need off of the medication may also vary with the medication and the reason for which you were taking it.  If you are taking any of these medications, please make sure you notify your pain physician before you undergo any procedures.

## 2018-02-13 NOTE — Progress Notes (Signed)
Safety precautions to be maintained throughout the outpatient stay will include: orient to surroundings, keep bed in low position, maintain call bell within reach at all times, provide assistance with transfer out of bed and ambulation.  

## 2018-02-13 NOTE — Patient Instructions (Signed)
Moderate Conscious Sedation, Adult Sedation is the use of medicines to promote relaxation and relieve discomfort and anxiety. Moderate conscious sedation is a type of sedation. Under moderate conscious sedation, you are less alert than normal, but you are still able to respond to instructions, touch, or both. Moderate conscious sedation is used during short medical and dental procedures. It is milder than deep sedation, which is a type of sedation under which you cannot be easily woken up. It is also milder than general anesthesia, which is the use of medicines to make you unconscious. Moderate conscious sedation allows you to return to your regular activities sooner. Tell a health care provider about:  Any allergies you have.  All medicines you are taking, including vitamins, herbs, eye drops, creams, and over-the-counter medicines.  Use of steroids (by mouth or creams).  Any problems you or family members have had with sedatives and anesthetic medicines.  Any blood disorders you have.  Any surgeries you have had.  Any medical conditions you have, such as sleep apnea.  Whether you are pregnant or may be pregnant.  Any use of cigarettes, alcohol, marijuana, or street drugs. What are the risks? Generally, this is a safe procedure. However, problems may occur, including:  Getting too much medicine (oversedation).  Nausea.  Allergic reaction to medicines.  Trouble breathing. If this happens, a breathing tube may be used to help with breathing. It will be removed when you are awake and breathing on your own.  Heart trouble.  Lung trouble.  What happens before the procedure? Staying hydrated Follow instructions from your health care provider about hydration, which may include:  Up to 2 hours before the procedure - you may continue to drink clear liquids, such as water, clear fruit juice, black coffee, and plain tea.  Eating and drinking restrictions Follow instructions from  your health care provider about eating and drinking, which may include:  8 hours before the procedure - stop eating heavy meals or foods such as meat, fried foods, or fatty foods.  6 hours before the procedure - stop eating light meals or foods, such as toast or cereal.  6 hours before the procedure - stop drinking milk or drinks that contain milk.  2 hours before the procedure - stop drinking clear liquids.  Medicine  Ask your health care provider about:  Changing or stopping your regular medicines. This is especially important if you are taking diabetes medicines or blood thinners.  Taking medicines such as aspirin and ibuprofen. These medicines can thin your blood. Do not take these medicines before your procedure if your health care provider instructs you not to.  Tests and exams  You will have a physical exam.  You may have blood tests done to show: ? How well your kidneys and liver are working. ? How well your blood can clot. General instructions  Plan to have someone take you home from the hospital or clinic.  If you will be going home right after the procedure, plan to have someone with you for 24 hours. What happens during the procedure?  An IV tube will be inserted into one of your veins.  Medicine to help you relax (sedative) will be given through the IV tube.  The medical or dental procedure will be performed. What happens after the procedure?  Your blood pressure, heart rate, breathing rate, and blood oxygen level will be monitored often until the medicines you were given have worn off.  Do not drive for 24 hours.   This information is not intended to replace advice given to you by your health care provider. Make sure you discuss any questions you have with your health care provider. Document Released: 03/08/2001 Document Revised: 11/17/2015 Document Reviewed: 10/03/2015 [employer] Interactive Patient Education  2018 [employer]   GENERAL RISKS AND  COMPLICATIONS  What are the risk, side effects and possible complications? Generally speaking, most procedures are safe.  However, with any procedure there are risks, side effects, and the possibility of complications.  The risks and complications are dependent upon the sites that are lesioned, or the type of nerve block to be performed.  The closer the procedure is to the spine, the more serious the risks are.  Great care is taken when placing the radio frequency needles, block needles or lesioning probes, but sometimes complications can occur. 1. Infection: Any time there is an injection through the skin, there is a risk of infection.  This is why sterile conditions are used for these blocks.  There are four possible types of infection. 1. Localized skin infection. 2. Central Nervous System Infection-This can be in the form of Meningitis, which can be deadly. 3. Epidural Infections-This can be in the form of an epidural abscess, which can cause pressure inside of the spine, causing compression of the spinal cord with subsequent paralysis. This would require an emergency surgery to decompress, and there are no guarantees that the patient would recover from the paralysis. 4. Discitis-This is an infection of the intervertebral discs.  It occurs in about 1% of discography procedures.  It is difficult to treat and it may lead to surgery.        2. Pain: the needles have to go through skin and soft tissues, will cause soreness.       3. Damage to internal structures:  The nerves to be lesioned may be near blood vessels or    other nerves which can be potentially damaged.       4. Bleeding: Bleeding is more common if the patient is taking blood thinners such as  aspirin, Coumadin, Ticiid, Plavix, etc., or if he/she have some genetic predisposition  such as hemophilia. Bleeding into the spinal canal can cause compression of the spinal  cord with subsequent paralysis.  This would require an emergency surgery  to  decompress and there are no guarantees that the patient would recover from the  paralysis.       5. Pneumothorax:  Puncturing of a lung is a possibility, every time a needle is introduced in  the area of the chest or upper back.  Pneumothorax refers to free air around the  collapsed lung(s), inside of the thoracic cavity (chest cavity).  Another two possible  complications related to a similar event would include: Hemothorax and Chylothorax.   These are variations of the Pneumothorax, where instead of air around the collapsed  lung(s), you may have blood or chyle, respectively.       6. Spinal headaches: They may occur with any procedures in the area of the spine.       7. Persistent CSF (Cerebro-Spinal Fluid) leakage: This is a rare problem, but may occur  with prolonged intrathecal or epidural catheters either due to the formation of a fistulous  track or a dural tear.       8. Nerve damage: By working so close to the spinal cord, there is always a possibility of  nerve damage, which could be as serious as a permanent spinal cord   injury with  paralysis.       9. Death:  Although rare, severe deadly allergic reactions known as "Anaphylactic  reaction" can occur to any of the medications used.      10. Worsening of the symptoms:  We can always make thing worse.  What are the chances of something like this happening? Chances of any of this occuring are extremely low.  By statistics, you have more of a chance of getting killed in a motor vehicle accident: while driving to the hospital than any of the above occurring .  Nevertheless, you should be aware that they are possibilities.  In general, it is similar to taking a shower.  Everybody knows that you can slip, hit your head and get killed.  Does that mean that you should not shower again?  Nevertheless always keep in mind that statistics do not mean anything if you happen to be on the wrong side of them.  Even if a procedure has a 1 (one) in a 1,000,000  (million) chance of going wrong, it you happen to be that one..Also, keep in mind that by statistics, you have more of a chance of having something go wrong when taking medications.  Who should not have this procedure? If you are on a blood thinning medication (e.g. Coumadin, Plavix, see list of "Blood Thinners"), or if you have an active infection going on, you should not have the procedure.  If you are taking any blood thinners, please inform your physician.  How should I prepare for this procedure?  Do not eat or drink anything at least six hours prior to the procedure.  Bring a driver with you .  It cannot be a taxi.  Come accompanied by an adult that can drive you back, and that is strong enough to help you if your legs get weak or numb from the local anesthetic.  Take all of your medicines the morning of the procedure with just enough water to swallow them.  If you have diabetes, make sure that you are scheduled to have your procedure done first thing in the morning, whenever possible.  If you have diabetes, take only half of your insulin dose and notify our nurse that you have done so as soon as you arrive at the clinic.  If you are diabetic, but only take blood sugar pills (oral hypoglycemic), then do not take them on the morning of your procedure.  You may take them after you have had the procedure.  Do not take aspirin or any aspirin-containing medications, at least eleven (11) days prior to the procedure.  They may prolong bleeding.  Wear loose fitting clothing that may be easy to take off and that you would not mind if it got stained with Betadine or blood.  Do not wear any jewelry or perfume  Remove any nail coloring.  It will interfere with some of our monitoring equipment.  NOTE: Remember that this is not meant to be interpreted as a complete list of all possible complications.  Unforeseen problems may occur.  BLOOD THINNERS The following drugs contain aspirin or other  products, which can cause increased bleeding during surgery and should not be taken for 2 weeks prior to and 1 week after surgery.  If you should need take something for relief of minor pain, you may take acetaminophen which is found in Tylenol,m Datril, Anacin-3 and Panadol. It is not blood thinner. The products listed below are.  Do not take any of   the products listed below in addition to any listed on your instruction sheet.  A.P.C or A.P.C with Codeine Codeine Phosphate Capsules #3 Ibuprofen Ridaura  ABC compound Congesprin Imuran rimadil  Advil Cope Indocin Robaxisal  Alka-Seltzer Effervescent Pain Reliever and Antacid Coricidin or Coricidin-D  Indomethacin Rufen  Alka-Seltzer plus Cold Medicine Cosprin Ketoprofen S-A-C Tablets  Anacin Analgesic Tablets or Capsules Coumadin Korlgesic Salflex  Anacin Extra Strength Analgesic tablets or capsules CP-2 Tablets Lanoril Salicylate  Anaprox Cuprimine Capsules Levenox Salocol  Anexsia-D Dalteparin Magan Salsalate  Anodynos Darvon compound Magnesium Salicylate Sine-off  Ansaid Dasin Capsules Magsal Sodium Salicylate  Anturane Depen Capsules Marnal Soma  APF Arthritis pain formula Dewitt's Pills Measurin Stanback  Argesic Dia-Gesic Meclofenamic Sulfinpyrazone  Arthritis Bayer Timed Release Aspirin Diclofenac Meclomen Sulindac  Arthritis pain formula Anacin Dicumarol Medipren Supac  Analgesic (Safety coated) Arthralgen Diffunasal Mefanamic Suprofen  Arthritis Strength Bufferin Dihydrocodeine Mepro Compound Suprol  Arthropan liquid Dopirydamole Methcarbomol with Aspirin Synalgos  ASA tablets/Enseals Disalcid Micrainin Tagament  Ascriptin Doan's Midol Talwin  Ascriptin A/D Dolene Mobidin Tanderil  Ascriptin Extra Strength Dolobid Moblgesic Ticlid  Ascriptin with Codeine Doloprin or Doloprin with Codeine Momentum Tolectin  Asperbuf Duoprin Mono-gesic Trendar  Aspergum Duradyne Motrin or Motrin IB Triminicin  Aspirin plain, buffered or enteric  coated Durasal Myochrisine Trigesic  Aspirin Suppositories Easprin Nalfon Trillsate  Aspirin with Codeine Ecotrin Regular or Extra Strength Naprosyn Uracel  Atromid-S Efficin Naproxen Ursinus  Auranofin Capsules Elmiron Neocylate Vanquish  Axotal Emagrin Norgesic Verin  Azathioprine Empirin or Empirin with Codeine Normiflo Vitamin E  Azolid Emprazil Nuprin Voltaren  Bayer Aspirin plain, buffered or children's or timed BC Tablets or powders Encaprin Orgaran Warfarin Sodium  Buff-a-Comp Enoxaparin Orudis Zorpin  Buff-a-Comp with Codeine Equegesic Os-Cal-Gesic   Buffaprin Excedrin plain, buffered or Extra Strength Oxalid   Bufferin Arthritis Strength Feldene Oxphenbutazone   Bufferin plain or Extra Strength Feldene Capsules Oxycodone with Aspirin   Bufferin with Codeine Fenoprofen Fenoprofen Pabalate or Pabalate-SF   Buffets II Flogesic Panagesic   Buffinol plain or Extra Strength Florinal or Florinal with Codeine Panwarfarin   Buf-Tabs Flurbiprofen Penicillamine   Butalbital Compound Four-way cold tablets Penicillin   Butazolidin Fragmin Pepto-Bismol   Carbenicillin Geminisyn Percodan   Carna Arthritis Reliever Geopen Persantine   Carprofen Gold's salt Persistin   Chloramphenicol Goody's Phenylbutazone   Chloromycetin Haltrain Piroxlcam   Clmetidine heparin Plaquenil   Cllnoril Hyco-pap Ponstel   Clofibrate Hydroxy chloroquine Propoxyphen         Before stopping any of these medications, be sure to consult the physician who ordered them.  Some, such as Coumadin (Warfarin) are ordered to prevent or treat serious conditions such as "deep thrombosis", "pumonary embolisms", and other heart problems.  The amount of time that you may need off of the medication may also vary with the medication and the reason for which you were taking it.  If you are taking any of these medications, please make sure you notify your pain physician before you undergo any procedures.          

## 2018-02-14 ENCOUNTER — Ambulatory Visit: Payer: 59 | Attending: Student in an Organized Health Care Education/Training Program

## 2018-02-14 DIAGNOSIS — Z9181 History of falling: Secondary | ICD-10-CM | POA: Diagnosis present

## 2018-02-14 DIAGNOSIS — G8929 Other chronic pain: Secondary | ICD-10-CM | POA: Diagnosis present

## 2018-02-14 DIAGNOSIS — M25662 Stiffness of left knee, not elsewhere classified: Secondary | ICD-10-CM | POA: Diagnosis present

## 2018-02-14 DIAGNOSIS — M25562 Pain in left knee: Secondary | ICD-10-CM | POA: Insufficient documentation

## 2018-02-15 NOTE — Therapy (Cosign Needed)
[facility] [address] [place], [address] Phone: [phone]   Fax:  [phone]  Physical Therapy Treatment  Patient Details  Name: Anna Mccarthy MRN: [id number] Date of Birth: December 10, 1955 No data recorded  Encounter Date: 02/14/2018  PT End of Session - 02/15/18 0749    Visit Number  1    Number of Visits  13    Date for PT Re-Evaluation  03/29/18    PT Start Time  1645    PT Stop Time  1745    PT Time Calculation (min)  60 min    Activity Tolerance  Patient tolerated treatment well    Behavior During Therapy  [facility] for tasks assessed/performed       Past Medical History:  Diagnosis Date  . Allergy   . Anxiety   . Arthritis   . Bronchitis   . Cancer ([place])    skin  . GERD (gastroesophageal reflux disease)   . Hypertension     Past Surgical History:  Procedure Laterality Date  . ABDOMINAL HYSTERECTOMY  1996  . BREAST BIOPSY Left 2011   stereo done by dr. Bary Castilla   . COLONOSCOPY    . COLONOSCOPY WITH PROPOFOL N/A 05/30/2016   Procedure: COLONOSCOPY WITH PROPOFOL;  Surgeon: Lollie Sails, MD;  Location: [facility] ENDOSCOPY;  Service: Endoscopy;  Laterality: N/A;  . HERNIA REPAIR  2012  . KNEE ARTHROSCOPY WITH MEDIAL MENISECTOMY Left 02/18/2016   Procedure: KNEE ARTHROSCOPY WITH PARTIAL MEDIAL MENISECTOMY;  Surgeon: Hessie Knows, MD;  Location: [facility] ORS;  Service: Orthopedics;  Laterality: Left;  . TUBAL LIGATION  1984    There were no vitals filed for this visit.  Subjective Assessment - 02/15/18 0816    Subjective  Pt reports that she has been experiencing L knee pain since meniscus repair approximately 3 summers ago and that it her knee has "not been the same since". Pt reports that she previously worked in the ER in patient access but has not been able to work due to knee pain (attempted to go back after and was anable to manage due to pain). Pt staes that her knee pain is currently 5/10 and  that the worst that it has been in the past week is 8/10. Pt has been getting genicular nerve blocks at pain clinic and is scheduled to have her next treatment in the beginning of September (9/9). Pt reports that she is "frusturated' with the current status of her knee and that she feels she is losing muscle tone due to the pain. Pt enjoys walking, watching her childrens dogs, travelling, and entertaining friends and family. Pt stated that she fell aproximately 3 months ago while doing laundry (no LOC reported).      Pertinent History  meniscus repair 3 years ago, fibromyalgia    Limitations  Walking    How long can you walk comfortably?  1 min    Patient Stated Goals  decr pain, be able to walk without pain, increase leg strength    Currently in Pain?  Yes    Pain Score  5     Pain Location  Knee    Pain Orientation  Left    Pain Descriptors / Indicators  Aching;Sharp    Pain Type  Chronic pain    Pain Onset  More than a month ago    Pain Frequency  Constant    Aggravating Factors   walking, side to side movement (ironing)  Pain Relieving Factors  nerve blocks, ice, medications (voltarin)          [facility] PT Assessment - 02/14/18 1803      Assessment   Medical Diagnosis  Left knee OA    Onset Date/Surgical Date  02/15/15    Next MD Visit  03/05/18    Prior Therapy  Yes   On neck ~8years ago at same clcnic      Balance Screen   Has the patient fallen in the past 6 months  Yes    How many times?  1    Has the patient had a decrease in activity level because of a fear of falling?   Yes      [place]  Private residence    Living Arrangements  Spouse/significant other    Type of [place] to enter    Entrance Stairs-Number of Steps  4    Entrance Stairs-Rails  Right    [place]  One level      Prior Function   Level of Independence  Independent      Cognition   Overall Cognitive Status  Within Functional Limits for tasks  assessed      Functional Tests   Functional tests  Sit to Stand;Single leg stance;Step down      Step Down   Comments  Decr eccentric control stepping down leading with RLE. hip hike on LLE to bring LLE down      Single Leg Stance   Comments  3s L, 10s R      Sit to Stand   Comments  B knee valgus during stand, decr eccentric control sitting down      ROM / Strength   AROM / PROM / Strength  AROM;Strength      AROM   Overall AROM   Deficits    Overall AROM Comments  RLE knee flex/ext WFL, LLE knee ext -10deg, flex WFL      Strength   Overall Strength  Deficits    Strength Assessment Site  Hip;Knee    Right/Left Hip  Right;Left    Right Hip Flexion  3+/5    Right Hip Extension  4/5    Right Hip ABduction  3+/5    Right Hip ADduction  4-/5    Left Hip Flexion  3+/5    Left Hip Extension  3/5    Left Hip ABduction  4/5    Left Hip ADduction  3/5    Right/Left Knee  Right;Left    Right Knee Flexion  5/5    Right Knee Extension  5/5    Left Knee Flexion  4/5    Left Knee Extension  4/5      Palpation   Palpation comment  Tenderness to palpation along medial joint line       Ambulation/Gait   Ambulation/Gait  Yes    Gait Pattern  Left flexed knee in stance;Trendelenburg    Stairs  Yes    Stair Management Technique  One rail Right;Step to pattern    Number of Stairs  4      6 minute walk test results    Aerobic Endurance Distance Walked  1370   incr pain starting at 1 min,  further incr pain at 3 min     Balance   Balance Assessed  Yes       TREATMENT Therapeutic Exercises (added to HEP) Staggered  sit to stand with RLE forward x10 Step ups leading with LLE light UE support to 3" step x10  Pt reports no increase in pain throughout therapeutic exercises.    PT Education - 02/15/18 0748    Education Details  Pt educated on PT plan of care. Pt also educated on HEP (staggered sit to stands and LLE step ups)    Person(s) Educated  Patient    Methods   Explanation;Demonstration;Handout    Comprehension  Verbalized understanding;Returned demonstration          PT Long Term Goals - 02/15/18 0805      PT LONG TERM GOAL #1   Title  Pt will be independent and compliant with HEP in order to maintain gins made in physical therapy and return to prior level of function.    Baseline  02/14/18: dependent on form and technique     Time  4    Period  Weeks    Status  New    Target Date  03/15/18      PT LONG TERM GOAL #2   Title  Pt will reports worst pain in past week <6/10 in order to increase tolerance to amb and return to prior level of function.    Baseline  02/14/18: worst pain 8/10    Time  6    Period  Weeks    Status  New    Target Date  03/29/18      PT LONG TERM GOAL #3   Title  Pt will improve 6MWT distance from 1376m to 1537m with no increase in pain >1 on NPRS in order to demonstrate increased endurance, increased tolerance to long distance amb and to return to prior level of function.    Baseline  02/14/18: 1370 ft, pain increased to 7/10 at 3 minutes    Time  6    Period  Weeks    Status  New    Target Date  03/29/18      PT LONG TERM GOAL #4   Title  Pt will improve FOTO score from 30 to 51 in order to improve functional mobility and return to prior level of function.    Baseline  02/14/18:    Time  6    Period  Weeks    Status  New    Target Date  03/29/18      PT LONG TERM GOAL #5   Title  Pt will ascend/descend steps with reciprocal pattern and no increase in pain in order to increase independence and safety while ambulating in the community.     Baseline  02/14/18: step to pattern, one handrail, incr pain    Time  6    Period  Weeks    Status  New            Plan - 02/15/18 3785    Clinical Impression Statement  Pt is 62 y.o female with increase L knee pain s/p meniscus repair approximately 3 years ago. Pt presents with increase L knee dysfunction indicated by decreased strength, decreased range of  motion, increased pain and gait impairments. Pt also demonstrates poor balance indicated by SLS (~3s on L, ~10s on R) and has had a fall in the past 6 months. Pt also demonstrates decreased tolerance to long distance ambulation indicated by 6MWT distance of 1329ft with increased pain once minute into testing. Pt will benefit from skilled PT in order to return to prior level of function.    Clinical  Presentation  Stable    Clinical Decision Making  Low    Rehab Potential  Fair    Clinical Impairments Affecting Rehab Potential  (+)motivated, family support, active lifestyle (-)multiple chronic conditions     PT Frequency  2x / week    PT Duration  6 weeks    PT Treatment/Interventions  ADLs/Self Care Home Management;Aquatic Therapy;Biofeedback;Cryotherapy;Electrical Stimulation;Gait training;Ultrasound;Moist Heat;Stair training;Functional mobility training;Therapeutic activities;Therapeutic exercise;Balance training;Neuromuscular re-education;Manual techniques;Patient/family education;Passive range of motion;Dry needling    PT Next Visit Plan  balance asssessment, joint mobs, squat, TKE, lateral step ups    PT Home Exercise Plan  See education section    Consulted and Agree with Plan of Care  Patient       Patient will benefit from skilled therapeutic intervention in order to improve the following deficits and impairments:  Abnormal gait, Decreased range of motion, Difficulty walking, Decreased activity tolerance, Pain, Decreased balance, Decreased strength  Visit Diagnosis: Chronic pain of left knee  Stiffness of left knee, not elsewhere classified  History of falling     Problem List Patient Active Problem List   Diagnosis Date Noted  . Venous ulcer ([place]) 02/02/2017  . Varicose veins of bilateral lower extremities with pain 05/16/2016  . Chronic venous insufficiency 05/16/2016  . Pain in limb 05/16/2016  . Swelling of limb 05/16/2016  . DJD (degenerative joint disease) 05/16/2016     Georg Ruddle, SPT 02/15/2018, 9:30 AM  [facility] [address], [place], [address] Phone: [phone]   Fax:  [phone]  Name: MINKA KNIGHT MRN: [id number] Date of Birth: 05/20/1956

## 2018-02-15 NOTE — Addendum Note (Signed)
Addended by: Blain Pais on: 02/15/2018 03:57 PM   Modules accepted: Orders

## 2018-02-21 ENCOUNTER — Ambulatory Visit: Payer: 59

## 2018-02-21 DIAGNOSIS — G8929 Other chronic pain: Secondary | ICD-10-CM

## 2018-02-21 DIAGNOSIS — M25562 Pain in left knee: Principal | ICD-10-CM

## 2018-02-21 DIAGNOSIS — M25662 Stiffness of left knee, not elsewhere classified: Secondary | ICD-10-CM

## 2018-02-21 DIAGNOSIS — Z9181 History of falling: Secondary | ICD-10-CM

## 2018-02-21 NOTE — Therapy (Signed)
[facility] [address] [place], [address] Phone: [phone]   Fax:  [phone]  Physical Therapy Treatment  Patient Details  Name: Anna Mccarthy MRN: [id number] Date of Birth: 19-Mar-1956 No data recorded  Encounter Date: 02/21/2018  PT End of Session - 02/21/18 1630    Visit Number  2    Number of Visits  13    Date for PT Re-Evaluation  03/29/18    PT Start Time  1600    PT Stop Time  1645    PT Time Calculation (min)  45 min    Activity Tolerance  Patient tolerated treatment well    Behavior During Therapy  [facility] for tasks assessed/performed       Past Medical History:  Diagnosis Date  . Allergy   . Anxiety   . Arthritis   . Bronchitis   . Cancer ([place])    skin  . GERD (gastroesophageal reflux disease)   . Hypertension     Past Surgical History:  Procedure Laterality Date  . ABDOMINAL HYSTERECTOMY  1996  . BREAST BIOPSY Left 2011   stereo done by dr. Bary Castilla   . COLONOSCOPY    . COLONOSCOPY WITH PROPOFOL N/A 05/30/2016   Procedure: COLONOSCOPY WITH PROPOFOL;  Surgeon: Lollie Sails, MD;  [facility] ENDOSCOPY;  Service: Endoscopy;  Laterality: N/A;  . HERNIA REPAIR  2012  . KNEE ARTHROSCOPY WITH MEDIAL MENISECTOMY Left 02/18/2016   Procedure: KNEE ARTHROSCOPY WITH PARTIAL MEDIAL MENISECTOMY;  Surgeon: Hessie Knows, MD;  Location: [facility] ORS;  Service: Orthopedics;  Laterality: Left;  . TUBAL LIGATION  1984    There were no vitals filed for this visit.  Subjective Assessment - 02/21/18 1608    Subjective  Patient reports she has been performing her HEP at home. Patient reports her knee is feeling good today.     Pertinent History  meniscus repair 3 years ago, fibromyalgia    Limitations  Walking    How long can you walk comfortably?  1 min    Patient Stated Goals  decr pain, be able to walk without pain, increase leg strength    Currently in Pain?  No/denies    Pain Onset  More  than a month ago         TREATMENT: Therapeutic Exercise: Hip abduction in standing - 2 x 15 40# at hip machine  Hip extension in standing - 2 x 8 YTB with UE support  Squats in standing with UE support - x 25 with focus on hip hinging  Seated ball kicks in sitting with 1kg ball - x 2 min B Running man in standing with UE support - 2 x 20   Nustep level 4 (seat level 6) - x 30 to improve strengthening    PT Education - 02/21/18 1620    Education Details  Form/technique with exercise; hip abduction with YTB     Person(s) Educated  Patient    Methods  Explanation;Demonstration    Comprehension  Verbalized understanding;Returned demonstration          PT Long Term Goals - 02/15/18 0805      PT LONG TERM GOAL #1   Title  Pt will be independent and compliant with HEP in order to maintain gins made in physical therapy and return to prior level of function.    Baseline  02/14/18: dependent on form and technique     Time  4  Period  Weeks    Status  New    Target Date  03/15/18      PT LONG TERM GOAL #2   Title  Pt will reports worst pain in past week <6/10 in order to increase tolerance to amb and return to prior level of function.    Baseline  02/14/18: worst pain 8/10    Time  6    Period  Weeks    Status  New    Target Date  03/29/18      PT LONG TERM GOAL #3   Title  Pt will improve 6MWT distance from 1380m to 1561m with no increase in pain >1 on NPRS in order to demonstrate increased endurance, increased tolerance to long distance amb and to return to prior level of function.    Baseline  02/14/18: 1370 ft, pain increased to 7/10 at 3 minutes    Time  6    Period  Weeks    Status  New    Target Date  03/29/18      PT LONG TERM GOAL #4   Title  Pt will improve FOTO score from 30 to 51 in order to improve functional mobility and return to prior level of function.    Baseline  02/14/18:    Time  6    Period  Weeks    Status  New    Target Date  03/29/18      PT  LONG TERM GOAL #5   Title  Pt will ascend/descend steps with reciprocal pattern and no increase in pain in order to increase independence and safety while ambulating in the community.     Baseline  02/14/18: step to pattern, one handrail, incr pain    Time  6    Period  Weeks    Status  New            Plan - 02/21/18 1636    Clinical Impression Statement  Patient demonstrates decreased hip hinging with exercises requiring tactile and verbal cueing to correct. Patient demonstrates increased forward knee translation and requires cueing to engage hip musculature to decrease indicating poor motor control. Patient will benefit from further skilled therapy to return prior level of function.     Rehab Potential  Fair    Clinical Impairments Affecting Rehab Potential  (+)motivated, family support, active lifestyle (-)multiple chronic conditions     PT Frequency  2x / week    PT Duration  6 weeks    PT Treatment/Interventions  ADLs/Self Care Home Management;Aquatic Therapy;Biofeedback;Cryotherapy;Electrical Stimulation;Gait training;Ultrasound;Moist Heat;Stair training;Functional mobility training;Therapeutic activities;Therapeutic exercise;Balance training;Neuromuscular re-education;Manual techniques;Patient/family education;Passive range of motion;Dry needling    PT Next Visit Plan  balance asssessment, joint mobs, squat, TKE, lateral step ups    PT Home Exercise Plan  See education section    Consulted and Agree with Plan of Care  Patient       Patient will benefit from skilled therapeutic intervention in order to improve the following deficits and impairments:  Abnormal gait, Decreased range of motion, Difficulty walking, Decreased activity tolerance, Pain, Decreased balance, Decreased strength  Visit Diagnosis: Chronic pain of left knee  Stiffness of left knee, not elsewhere classified  History of falling     Problem List Patient Active Problem List   Diagnosis Date Noted  .  Venous ulcer ([place]) 02/02/2017  . Varicose veins of bilateral lower extremities with pain 05/16/2016  . Chronic venous insufficiency 05/16/2016  . Pain in limb 05/16/2016  .  Swelling of limb 05/16/2016  . DJD (degenerative joint disease) 05/16/2016    Blythe Stanford, PT DPT 02/21/2018, 4:48 PM  [place] [address] [place], [address] Phone: [phone]   Fax:  [phone]  Name: Anna Mccarthy MRN: [id number] Date of Birth: 07/01/1955

## 2018-02-27 ENCOUNTER — Other Ambulatory Visit: Payer: Self-pay

## 2018-02-27 ENCOUNTER — Ambulatory Visit: Payer: 59 | Attending: Student in an Organized Health Care Education/Training Program

## 2018-02-27 DIAGNOSIS — Z9181 History of falling: Secondary | ICD-10-CM | POA: Insufficient documentation

## 2018-02-27 DIAGNOSIS — M25662 Stiffness of left knee, not elsewhere classified: Secondary | ICD-10-CM | POA: Diagnosis present

## 2018-02-27 DIAGNOSIS — M25562 Pain in left knee: Secondary | ICD-10-CM | POA: Diagnosis not present

## 2018-02-27 DIAGNOSIS — G8929 Other chronic pain: Secondary | ICD-10-CM | POA: Insufficient documentation

## 2018-02-27 NOTE — Therapy (Signed)
[place] [facility] [address] [place], [place], [address] Phone: [phone]   Fax:  [phone]  Physical Therapy Treatment  Patient Details  Name: Anna Mccarthy MRN: [id number] Date of Birth: [birth year] No data recorded  Encounter Date: 02/27/2018  PT End of Session - 02/27/18 1119    Visit Number  3    Number of Visits  14    Date for PT Re-Evaluation  03/29/18    PT Start Time  0945    PT Stop Time  1045    PT Time Calculation (min)  60 min    Activity Tolerance  Patient tolerated treatment well    Behavior During Therapy  [facility] for tasks assessed/performed       Past Medical History:  Diagnosis Date  . Allergy   . Anxiety   . Arthritis   . Bronchitis   . Cancer ([place])    skin  . GERD (gastroesophageal reflux disease)   . Hypertension     Past Surgical History:  Procedure Laterality Date  . ABDOMINAL HYSTERECTOMY  1996  . BREAST BIOPSY Left 2011   stereo done by dr. Bary Castilla   . COLONOSCOPY    . COLONOSCOPY WITH PROPOFOL N/A 05/30/2016   Procedure: COLONOSCOPY WITH PROPOFOL;  Surgeon: Lollie Sails, MD;  Location: [facility] ENDOSCOPY;  Service: Endoscopy;  Laterality: N/A;  . HERNIA REPAIR  2012  . KNEE ARTHROSCOPY WITH MEDIAL MENISECTOMY Left 02/18/2016   Procedure: KNEE ARTHROSCOPY WITH PARTIAL MEDIAL MENISECTOMY;  Surgeon: Hessie Knows, MD;  Location: [facility] ORS;  Service: Orthopedics;  Laterality: Left;  . TUBAL LIGATION  1984    There were no vitals filed for this visit.  Subjective Assessment - 02/27/18 1112    Subjective  Pt her for initial aquatic PT session. Reports L knee pain 6/10. Pt notes she has been with her grandkids in [place] and home also has steps; therefore, increasing her pain. No other voiced concerns. Pt is a limited swimmer, but comfortable in the water. Pt notes weight gain has been somewhat of an issue with less activity due to knee pain; pt would like to be able to me more active and  manage weight to help decrease stress on knee as well.     Pertinent History  meniscus repair 3 years ago, fibromyalgia      Enters/exits via ramp   Ambulation  6 L fwd  4 L side  4 L side with minisquat  Core/LE Squats, 25x  Bench   Straight leg up and outs, B, 30x ea  Step  Partial step ups, B, 30 ea  Bench B hip ext with noodle, 25x ea  Side lunges  B, 25x ea  Active stretching, 4x ea, B  Hamstrings/hip flexor/quad  Hip abd/adductors  Gastroc/soleus complex                               PT Education - 02/27/18 1116    Education Details  Properties of water and benefits of water exercise as is pertains to body. Core stabilization, water walking, squats, lateral lunges, resisted hip ext, active stretching    Person(s) Educated  Patient    Methods  Explanation;Demonstration    Comprehension  Verbalized understanding;Returned demonstration;Verbal cues required          PT Long Term Goals - 02/15/18 0805      PT LONG TERM GOAL #1  Title  Pt will be independent and compliant with HEP in order to maintain gins made in physical therapy and return to prior level of function.    Baseline  02/14/18: dependent on form and technique     Time  4    Period  Weeks    Status  New    Target Date  03/15/18      PT LONG TERM GOAL #2   Title  Pt will reports worst pain in past week <6/10 in order to increase tolerance to amb and return to prior level of function.    Baseline  02/14/18: worst pain 8/10    Time  6    Period  Weeks    Status  New    Target Date  03/29/18      PT LONG TERM GOAL #3   Title  Pt will improve 6MWT distance from 1376m to 1543m with no increase in pain >1 on NPRS in order to demonstrate increased endurance, increased tolerance to long distance amb and to return to prior level of function.    Baseline  02/14/18: 1370 ft, pain increased to 7/10 at 3 minutes    Time  6    Period  Weeks    Status  New    Target Date   03/29/18      PT LONG TERM GOAL #4   Title  Pt will improve FOTO score from 30 to 51 in order to improve functional mobility and return to prior level of function.    Baseline  02/14/18:    Time  6    Period  Weeks    Status  New    Target Date  03/29/18      PT LONG TERM GOAL #5   Title  Pt will ascend/descend steps with reciprocal pattern and no increase in pain in order to increase independence and safety while ambulating in the community.     Baseline  02/14/18: step to pattern, one handrail, incr pain    Time  6    Period  Weeks    Status  New            Plan - 02/27/18 1120    Clinical Impression Statement  Pt tolerated session well. Education on proper form for squat/lunge positiion with improvement/correction with intermittent cueing. Pt has good understanding of goals for aquatic exercises and eager to progress. Plan to progress by adding resistance to strengthening and adding suspended speed work for strength and cardiovascular exercise.     Rehab Potential  Fair    Clinical Impairments Affecting Rehab Potential  (+)motivated, family support, active lifestyle (-)multiple chronic conditions     PT Frequency  2x / week    PT Duration  6 weeks    PT Treatment/Interventions  ADLs/Self Care Home Management;Aquatic Therapy;Biofeedback;Cryotherapy;Electrical Stimulation;Gait training;Ultrasound;Moist Heat;Stair training;Functional mobility training;Therapeutic activities;Therapeutic exercise;Balance training;Neuromuscular re-education;Manual techniques;Patient/family education;Passive range of motion;Dry needling    PT Next Visit Plan  balance asssessment, joint mobs, squat, TKE, lateral step ups    PT Home Exercise Plan  See education section    Consulted and Agree with Plan of Care  Patient       Patient will benefit from skilled therapeutic intervention in order to improve the following deficits and impairments:  Abnormal gait, Decreased range of motion, Difficulty walking,  Decreased activity tolerance, Pain, Decreased balance, Decreased strength  Visit Diagnosis: Chronic pain of left knee  Stiffness of left knee, not elsewhere classified  History  of falling     Problem List Patient Active Problem List   Diagnosis Date Noted  . Venous ulcer ([place]) 02/02/2017  . Varicose veins of bilateral lower extremities with pain 05/16/2016  . Chronic venous insufficiency 05/16/2016  . Pain in limb 05/16/2016  . Swelling of limb 05/16/2016  . DJD (degenerative joint disease) 05/16/2016    Larae Grooms 02/27/2018, 11:25 AM  [place] MAIN [facility] SERVICES [address] [place], [place], [address] Phone: [phone]   Fax:  [phone]  Name: SHULAMIS WENBERG MRN: [id number] Date of Birth: 1956/05/26

## 2018-03-01 ENCOUNTER — Other Ambulatory Visit: Payer: Self-pay

## 2018-03-01 ENCOUNTER — Ambulatory Visit: Payer: 59

## 2018-03-01 DIAGNOSIS — Z9181 History of falling: Secondary | ICD-10-CM

## 2018-03-01 DIAGNOSIS — M25562 Pain in left knee: Principal | ICD-10-CM

## 2018-03-01 DIAGNOSIS — G8929 Other chronic pain: Secondary | ICD-10-CM

## 2018-03-01 DIAGNOSIS — M25662 Stiffness of left knee, not elsewhere classified: Secondary | ICD-10-CM

## 2018-03-01 NOTE — Therapy (Signed)
[place] MAIN [facility] SERVICES [address] [place], [place], [address] Phone: [phone]   Fax:  [phone]  Physical Therapy Treatment  Patient Details  Name: Anna Mccarthy MRN: [id number] Date of Birth: 11-Nov-1955 No data recorded  Encounter Date: 03/01/2018  PT End of Session - 03/01/18 1315    Visit Number  4    Number of Visits  14    Date for PT Re-Evaluation  03/29/18    PT Start Time  1030    PT Stop Time  1130    PT Time Calculation (min)  60 min       Past Medical History:  Diagnosis Date  . Allergy   . Anxiety   . Arthritis   . Bronchitis   . Cancer ([place])    skin  . GERD (gastroesophageal reflux disease)   . Hypertension     Past Surgical History:  Procedure Laterality Date  . ABDOMINAL HYSTERECTOMY  1996  . BREAST BIOPSY Left 2011   stereo done by dr. Bary Castilla   . COLONOSCOPY    . COLONOSCOPY WITH PROPOFOL N/A 05/30/2016   Procedure: COLONOSCOPY WITH PROPOFOL;  Surgeon: Lollie Sails, MD;  Location: [facility] ENDOSCOPY;  Service: Endoscopy;  Laterality: N/A;  . HERNIA REPAIR  2012  . KNEE ARTHROSCOPY WITH MEDIAL MENISECTOMY Left 02/18/2016   Procedure: KNEE ARTHROSCOPY WITH PARTIAL MEDIAL MENISECTOMY;  Surgeon: Hessie Knows, MD;  Location: [facility] ORS;  Service: Orthopedics;  Laterality: Left;  . TUBAL LIGATION  1984    There were no vitals filed for this visit.  Subjective Assessment - 03/01/18 1311    Subjective  Pt reports tolerating last session well; she states she could feel she had done some work, but denies any increased pain. Pt notes some increased all over body achiness today; feels it is due to the incoming rainy weather. Overall, L knee pain maintains a 6/10    Pertinent History  meniscus repair 3 years ago, fibromyalgia      Enters/exits via ramp  Ambulation, warm up  2 L fwd  2 L side   2 L side with squat  Core with LE strength  Hip abd/add low kicks with speed, B 30x ea  Hip flex/ext low  kicks with speed B, 30x ea  Squat, 25x  Suspended work, 10 min; increased time for initial teaching  [employer]   Ski   X [employer]  Core with UE strength  Red dumbbells, 20x ea   Triceps ext   Sh abd/ add   Sh flex/ext   Sh horiz abd.add (dumbbells vertical)  Suspended work, 1 min ea, total 5 min  Jog  Jack   Ski   X jack   [employer]   2 L recovery fwd walk with posterior thumb up arm drag  Side lunges with 1 red dumbbell, B 25x ea   Suspended work as above 5 min with 2 L fwd recovery walk and 1 L bkwd walk with fwd arm drag  Active stretching, 3x ea  LB with center, L, R positions  Hamstrings/hip flexor and quad  Hip ab/adductors                           PT Education - 03/01/18 1313    Education Details  suspended position work/cardiovascular work; RPE scale, adding mild resistance to core and LE work. Hip kick/core work    Forensic psychologist) Educated  Patient    Methods  Explanation;Demonstration    Comprehension  Verbalized understanding;Returned demonstration;Verbal cues required          PT Long Term Goals - 02/15/18 0805      PT LONG TERM GOAL #1   Title  Pt will be independent and compliant with HEP in order to maintain gins made in physical therapy and return to prior level of function.    Baseline  02/14/18: dependent on form and technique     Time  4    Period  Weeks    Status  New    Target Date  03/15/18      PT LONG TERM GOAL #2   Title  Pt will reports worst pain in past week <6/10 in order to increase tolerance to amb and return to prior level of function.    Baseline  02/14/18: worst pain 8/10    Time  6    Period  Weeks    Status  New    Target Date  03/29/18      PT LONG TERM GOAL #3   Title  Pt will improve 6MWT distance from 1331m to 1558m with no increase in pain >1 on NPRS in order to demonstrate increased endurance, increased tolerance to long distance amb and to return to prior level of function.    Baseline   02/14/18: 1370 ft, pain increased to 7/10 at 3 minutes    Time  6    Period  Weeks    Status  New    Target Date  03/29/18      PT LONG TERM GOAL #4   Title  Pt will improve FOTO score from 30 to 51 in order to improve functional mobility and return to prior level of function.    Baseline  02/14/18:    Time  6    Period  Weeks    Status  New    Target Date  03/29/18      PT LONG TERM GOAL #5   Title  Pt will ascend/descend steps with reciprocal pattern and no increase in pain in order to increase independence and safety while ambulating in the community.     Baseline  02/14/18: step to pattern, one handrail, incr pain    Time  6    Period  Weeks    Status  New            Plan - 03/01/18 1316    Clinical Impression Statement  Pt tolerated session well. Demonstrates ability to tolerate mild resist to lunge work and core wiht upper extremity work whild maintaing wall sit position. Good understanding and demonstration of suspenced work. Will continue to progress strengthening and cardiovascular work to develop an interval program.     Rehab Potential  Fair    Clinical Impairments Affecting Rehab Potential  (+)motivated, family support, active lifestyle (-)multiple chronic conditions     PT Frequency  2x / week    PT Duration  6 weeks    PT Treatment/Interventions  ADLs/Self Care Home Management;Aquatic Therapy;Biofeedback;Cryotherapy;Electrical Stimulation;Gait training;Ultrasound;Moist Heat;Stair training;Functional mobility training;Therapeutic activities;Therapeutic exercise;Balance training;Neuromuscular re-education;Manual techniques;Patient/family education;Passive range of motion;Dry needling    PT Next Visit Plan  balance asssessment, joint mobs, squat, TKE, lateral step ups    PT Home Exercise Plan  See education section    Consulted and Agree with Plan of Care  Patient       Patient will benefit from skilled therapeutic intervention in order to  improve the following  deficits and impairments:  Abnormal gait, Decreased range of motion, Difficulty walking, Decreased activity tolerance, Pain, Decreased balance, Decreased strength  Visit Diagnosis: Chronic pain of left knee  History of falling  Stiffness of left knee, not elsewhere classified     Problem List Patient Active Problem List   Diagnosis Date Noted  . Venous ulcer ([place]) 02/02/2017  . Varicose veins of bilateral lower extremities with pain 05/16/2016  . Chronic venous insufficiency 05/16/2016  . Pain in limb 05/16/2016  . Swelling of limb 05/16/2016  . DJD (degenerative joint disease) 05/16/2016    Larae Grooms 03/01/2018, 1:20 PM  [place] MAIN [facility] SERVICES [address] [place], [place], [address] Phone: [phone]   Fax:  [phone]  Name: Anna Mccarthy MRN: [id number] Date of Birth: [birth year]

## 2018-03-05 ENCOUNTER — Ambulatory Visit
Admission: RE | Admit: 2018-03-05 | Discharge: 2018-03-05 | Disposition: A | Discharge status: Home / Self Care | Payer: 59 | Source: Ambulatory Visit | Attending: Student in an Organized Health Care Education/Training Program | Admitting: Student in an Organized Health Care Education/Training Program

## 2018-03-05 ENCOUNTER — Other Ambulatory Visit: Payer: Self-pay

## 2018-03-05 ENCOUNTER — Ambulatory Visit (HOSPITAL_BASED_OUTPATIENT_CLINIC_OR_DEPARTMENT_OTHER): Payer: 59 | Admitting: Student in an Organized Health Care Education/Training Program

## 2018-03-05 ENCOUNTER — Encounter: Payer: Self-pay | Admitting: Student in an Organized Health Care Education/Training Program

## 2018-03-05 DIAGNOSIS — Z882 Allergy status to sulfonamides status: Secondary | ICD-10-CM | POA: Diagnosis not present

## 2018-03-05 DIAGNOSIS — F419 Anxiety disorder, unspecified: Secondary | ICD-10-CM | POA: Insufficient documentation

## 2018-03-05 DIAGNOSIS — Z90710 Acquired absence of both cervix and uterus: Secondary | ICD-10-CM | POA: Insufficient documentation

## 2018-03-05 DIAGNOSIS — M1712 Unilateral primary osteoarthritis, left knee: Secondary | ICD-10-CM | POA: Insufficient documentation

## 2018-03-05 DIAGNOSIS — Z885 Allergy status to narcotic agent status: Secondary | ICD-10-CM | POA: Diagnosis not present

## 2018-03-05 DIAGNOSIS — M25562 Pain in left knee: Secondary | ICD-10-CM | POA: Diagnosis present

## 2018-03-05 DIAGNOSIS — Z79899 Other long term (current) drug therapy: Secondary | ICD-10-CM | POA: Diagnosis not present

## 2018-03-05 MED ORDER — DEXAMETHASONE SODIUM PHOSPHATE 10 MG/ML IJ SOLN
10.0000 mg | Freq: Once | INTRAMUSCULAR | Status: AC
  Administered 2018-03-05: 10 mg
  Filled 2018-03-05: qty 1

## 2018-03-05 MED ORDER — ROPIVACAINE HCL 2 MG/ML IJ SOLN
10.0000 mL | Freq: Once | INTRAMUSCULAR | Status: AC
  Administered 2018-03-05: 10 mL
  Filled 2018-03-05: qty 10

## 2018-03-05 MED ORDER — LIDOCAINE HCL 2 % IJ SOLN
20.0000 mL | Freq: Once | INTRAMUSCULAR | Status: AC
  Administered 2018-03-05: 400 mg
  Filled 2018-03-05: qty 40

## 2018-03-05 MED ORDER — FENTANYL CITRATE (PF) 100 MCG/2ML IJ SOLN
25.0000 ug | INTRAMUSCULAR | Status: DC | PRN
  Administered 2018-03-05: 25 ug via INTRAVENOUS
  Filled 2018-03-05: qty 2

## 2018-03-05 MED ORDER — LACTATED RINGERS IV SOLN
1000.0000 mL | Freq: Once | INTRAVENOUS | Status: AC
  Administered 2018-03-05: 1000 mL via INTRAVENOUS

## 2018-03-05 NOTE — Progress Notes (Signed)
Patient's Name: Anna Mccarthy  MRN: [id number]  Referring Provider: Gillis Santa, MD  DOB: May 04, 1956  PCP: Maryland Pink, MD  DOS: 03/05/2018  Note by: Gillis Santa, MD  Service setting: Ambulatory outpatient  Specialty: Interventional Pain Management  Patient type: Established  Location: [facility] (AMB) Pain Management Facility  Visit type: Interventional Procedure   Primary Reason for Visit: Interventional Pain Management Treatment. CC: Knee Pain (left)  Procedure:          Anesthesia, Analgesia, Anxiolysis:  Type: Genicular Nerves Block (Superior-lateral, Superior-medial, and Inferior-medial Genicular Nerves) #2  CPT: 59563      Primary Purpose: Diagnostic Region: Lateral, Anterior, and Medial aspects of the knee joint, above and below the knee joint proper. Level: Superior and inferior to the knee joint. Target Area: For Genicular Nerve block(s), the targets are: the superior-lateral genicular nerve, located in the lateral distal portion of the femoral shaft as it curves to form the lateral epicondyle, in the region of the distal femoral metaphysis; the superior-medial genicular nerve, located in the medial distal portion of the femoral shaft as it curves to form the medial epicondyle; and the inferior-medial genicular nerve, located in the medial, proximal portion of the tibial shaft, as it curves to form the medial epicondyle, in the region of the proximal tibial metaphysis. Approach: Anterior, percutaneous, ipsilateral approach. Laterality: Left knee Position: Modified Fowler's position with pillows under the targeted knee(s).  Type: Moderate (Conscious) Sedation combined with Local Anesthesia Indication(s): Analgesia and Anxiety Route: Intravenous (IV) IV Access: Secured Sedation: Meaningful verbal contact was maintained at all times during the procedure  Local Anesthetic: Lidocaine 1-2%   Indications: 1. Primary osteoarthritis of left knee    Pain Score: Pre-procedure: 4  /10 Post-procedure: 0-No pain/10  Pre-op Assessment:  Anna Mccarthy is a 62 y.o. (year old), female patient, seen today for interventional treatment. She  has a past surgical history that includes Tubal ligation (1984); Abdominal hysterectomy (1996); Hernia repair (2012); Colonoscopy; Knee arthroscopy with medial menisectomy (Left, 02/18/2016); Colonoscopy with propofol (N/A, 05/30/2016); and Breast biopsy (Left, 2011). Anna Mccarthy has a current medication list which includes the following prescription(s): alprazolam, b complex vitamins, diclofenac, duloxetine, escitalopram, hydrochlorothiazide, omega-3 fatty acids, omeprazole, tizanidine, tramadol, turmeric, vitamin e, and alprazolam, and the following Facility-Administered Medications: fentanyl. Her primarily concern today is the Knee Pain (left)  Initial Vital Signs:  Pulse/HCG Rate: 87ECG Heart Rate: 67 Temp: 98.1 F (36.7 C) Resp: 16 BP: (!) 122/96 SpO2: 99 %  BMI: Estimated body mass index is 31.09 kg/m as calculated from the following:   Height as of this encounter: 5\' 2"  (1.575 m).   Weight as of this encounter: 170 lb (77.1 kg).  Risk Assessment: Allergies: Reviewed. She is allergic to percocet [oxycodone-acetaminophen] and sulfa antibiotics.  Allergy Precautions: None required Coagulopathies: Reviewed. None identified.  Blood-thinner therapy: None at this time Active Infection(s): Reviewed. None identified. Anna Mccarthy is afebrile  Site Confirmation: Anna Mccarthy was asked to confirm the procedure and laterality before marking the site Procedure checklist: Completed Consent: Before the procedure and under the influence of no sedative(s), amnesic(s), or anxiolytics, the patient was informed of the treatment options, risks and possible complications. To fulfill our ethical and legal obligations, as recommended by the American Medical Association's Code of Ethics, I have informed the patient of my clinical impression; the nature and purpose of  the treatment or procedure; the risks, benefits, and possible complications of the intervention; the alternatives, including doing nothing; the risk(s) and benefit(s)  of the alternative treatment(s) or procedure(s); and the risk(s) and benefit(s) of doing nothing. The patient was provided information about the general risks and possible complications associated with the procedure. These may include, but are not limited to: failure to achieve desired goals, infection, bleeding, organ or nerve damage, allergic reactions, paralysis, and death. In addition, the patient was informed of those risks and complications associated to the procedure, such as failure to decrease pain; infection; bleeding; organ or nerve damage with subsequent damage to sensory, motor, and/or autonomic systems, resulting in permanent pain, numbness, and/or weakness of one or several areas of the body; allergic reactions; (i.e.: anaphylactic reaction); and/or death. Furthermore, the patient was informed of those risks and complications associated with the medications. These include, but are not limited to: allergic reactions (i.e.: anaphylactic or anaphylactoid reaction(s)); adrenal axis suppression; blood sugar elevation that in diabetics may result in ketoacidosis or comma; water retention that in patients with history of congestive heart failure may result in shortness of breath, pulmonary edema, and decompensation with resultant heart failure; weight gain; swelling or edema; medication-induced neural toxicity; particulate matter embolism and blood vessel occlusion with resultant organ, and/or nervous system infarction; and/or aseptic necrosis of one or more joints. Finally, the patient was informed that Medicine is not an exact science; therefore, there is also the possibility of unforeseen or unpredictable risks and/or possible complications that may result in a catastrophic outcome. The patient indicated having understood very clearly.  We have given the patient no guarantees and we have made no promises. Enough time was given to the patient to ask questions, all of which were answered to the patient's satisfaction. Anna Mccarthy has indicated that she wanted to continue with the procedure. Attestation: I, the ordering provider, attest that I have discussed with the patient the benefits, risks, side-effects, alternatives, likelihood of achieving goals, and potential problems during recovery for the procedure that I have provided informed consent. Date  Time: 03/05/2018 11:46 AM  Pre-Procedure Preparation:  Monitoring: As per clinic protocol. Respiration, ETCO2, SpO2, BP, heart rate and rhythm monitor placed and checked for adequate function Safety Precautions: Patient was assessed for positional comfort and pressure points before starting the procedure. Time-out: I initiated and conducted the "Time-out" before starting the procedure, as per protocol. The patient was asked to participate by confirming the accuracy of the "Time Out" information. Verification of the correct person, site, and procedure were performed and confirmed by me, the nursing staff, and the patient. "Time-out" conducted as per Joint Commission's Universal Protocol (UP.01.01.01). Time: 1256  Description of Procedure:          Area Prepped: Entire knee area, from mid-thigh to mid-shin, lateral, anterior, and medial aspects. Prepping solution: ChloraPrep (2% chlorhexidine gluconate and 70% isopropyl alcohol) Safety Precautions: Aspiration looking for blood return was conducted prior to all injections. At no point did we inject any substances, as a needle was being advanced. No attempts were made at seeking any paresthesias. Safe injection practices and needle disposal techniques used. Medications properly checked for expiration dates. SDV (single dose vial) medications used. Latex Allergy precautions taken.   Description of the Procedure: Protocol guidelines were followed.  The patient was placed in position over the procedure table. The target area was identified and the area prepped in the usual manner. Skin & deeper tissues infiltrated with local anesthetic. Appropriate amount of time allowed to pass for local anesthetics to take effect. The procedure needles were then advanced to the target area. Proper needle placement  secured. Negative aspiration confirmed. Solution injected in intermittent fashion, asking for systemic symptoms every 0.5cc of injectate. The needles were then removed and the area cleansed, making sure to leave some of the prepping solution back to take advantage of its long term bactericidal properties.  Vitals:   03/05/18 1305 03/05/18 1315 03/05/18 1325 03/05/18 1335  BP: (!) 130/94 (!) 159/81 135/82 136/85  Pulse:    72  Resp: 10 12 18 13   Temp:    99 F (37.2 C)  TempSrc:    Temporal  SpO2: 97% 97% 98% 99%  Weight:      Height:        Start Time: 1256 hrs. End Time: 1303 hrs. Materials:  Needle(s) Type: Regular needle Gauge: 22G Length: 3.5-in Medication(s): Please see orders for medications and dosing details. 6 cc solution made of 5 cc of 0.2% ropivacaine and 1 cc of Decadron, 10 mg/cc. 2 cc injected at each level above Imaging Guidance (Non-Spinal):          Type of Imaging Technique: Fluoroscopy Guidance (Non-Spinal) Indication(s): Assistance in needle guidance and placement for procedures requiring needle placement in or near specific anatomical locations not easily accessible without such assistance. Exposure Time: Please see nurses notes. Contrast: Before injecting any contrast, we confirmed that the patient did not have an allergy to iodine, shellfish, or radiological contrast. Once satisfactory needle placement was completed at the desired level, radiological contrast was injected. Contrast injected under live fluoroscopy. No contrast complications. See chart for type and volume of contrast used. Fluoroscopic Guidance: I was  personally present during the use of fluoroscopy. "Tunnel Vision Technique" used to obtain the best possible view of the target area. Parallax error corrected before commencing the procedure. "Direction-depth-direction" technique used to introduce the needle under continuous pulsed fluoroscopy. Once target was reached, antero-posterior, oblique, and lateral fluoroscopic projection used confirm needle placement in all planes. Images permanently stored in EMR. Interpretation: I personally interpreted the imaging intraoperatively. Adequate needle placement confirmed in multiple planes. Appropriate spread of contrast into desired area was observed. No evidence of afferent or efferent intravascular uptake. Permanent images saved into the patient's record.  Antibiotic Prophylaxis:   Anti-infectives (From admission, onward)   None     Indication(s): None identified  Post-operative Assessment:  Post-procedure Vital Signs:  Pulse/HCG Rate: 7272 Temp: 99 F (37.2 C) Resp: 13 BP: 136/85 SpO2: 99 %  EBL: None  Complications: No immediate post-treatment complications observed by team, or reported by patient.  Note: The patient tolerated the entire procedure well. A repeat set of vitals were taken after the procedure and the patient was kept under observation following institutional policy, for this type of procedure. Post-procedural neurological assessment was performed, showing return to baseline, prior to discharge. The patient was provided with post-procedure discharge instructions, including a section on how to identify potential problems. Should any problems arise concerning this procedure, the patient was given instructions to immediately contact us, at any time, without hesitation. In any case, we plan to contact the patient by telephone for a follow-up status report regarding this interventional procedure.  Comments:  No additional relevant information. 5 out of 5 strength bilateral lower  extremity: Plantar flexion, dorsiflexion, knee flexion, knee extension. Plan of Care    Imaging Orders     DG C-Arm 1-60 Min-No Report Procedure Orders    No procedure(s) ordered today    Medications ordered for procedure: Meds ordered this encounter  Medications  . lactated ringers infusion 1,000 mL  .  fentaNYL (SUBLIMAZE) injection 25-100 mcg    Make sure Narcan is available in the pyxis when using this medication. In the event of respiratory depression (RR< 8/min): Titrate NARCAN (naloxone) in increments of 0.1 to 0.2 mg IV at 2-3 minute intervals, until desired degree of reversal.  . lidocaine (XYLOCAINE) 2 % (with pres) injection 400 mg  . ropivacaine (PF) 2 mg/mL (0.2%) (NAROPIN) injection 10 mL  . dexamethasone (DECADRON) injection 10 mg   Medications administered: We administered lactated ringers, fentaNYL, lidocaine, ropivacaine (PF) 2 mg/mL (0.2%), and dexamethasone.  See the medical record for exact dosing, route, and time of administration.  New Prescriptions   No medications on file   Disposition: Discharge home  Discharge Date & Time: 03/05/2018; 1335 hrs.   Physician-requested Follow-up: Return in about 4 weeks (around 04/02/2018) for Post Procedure Evaluation.  Future Appointments  Date Time Provider [place]  03/06/2018 11:15 AM Cleophus Molt E, PTA [facility] None  03/08/2018 12:15 PM Cleophus Molt E, PTA [facility] None  03/12/2018  1:00 PM Rissell, [place], PT [facility] None  03/15/2018 10:45 AM Cleophus Molt E, PTA [facility] None  03/19/2018  2:30 PM Rissell, [place] Bells, PT [facility] None  03/22/2018 10:30 AM Cleophus Molt E, PTA [facility] None  03/26/2018  1:00 PM Blythe Stanford, PT [facility] None  04/03/2018  2:30 PM Gillis Santa, MD [facility] None  04/05/2018 12:15 PM Larae Grooms, PTA [facility] None  04/09/2018 11:15 AM Blythe Stanford, PT [facility] None   Primary Care Physician: Maryland Pink, MD Location: [facility]  Facility Note by: Gillis Santa, MD Date: 03/05/2018; Time: 2:50 PM  Disclaimer:  Medicine is not an exact science. The only guarantee in medicine is that nothing is guaranteed. It is important to note that the decision to proceed with this intervention was based on the information collected from the patient. The Data and conclusions were drawn from the patient's questionnaire, the interview, and the physical examination. Because the information was provided in large part by the patient, it cannot be guaranteed that it has not been purposely or unconsciously manipulated. Every effort has been made to obtain as much relevant data as possible for this evaluation. It is important to note that the conclusions that lead to this procedure are derived in large part from the available data. Always take into account that the treatment will also be dependent on availability of resources and existing treatment guidelines, considered by other Pain Management Practitioners as being common knowledge and practice, at the time of the intervention. For Medico-Legal purposes, it is also important to point out that variation in procedural techniques and pharmacological choices are the acceptable norm. The indications, contraindications, technique, and results of the above procedure should only be interpreted and judged by a Board-Certified Interventional Pain Specialist with extensive familiarity and expertise in the same exact procedure and technique.

## 2018-03-05 NOTE — Patient Instructions (Signed)
____________________________________________________________________________________________  Post-procedure Information What to expect: Most procedures involve the use of a local anesthetic (numbing medicine), and a steroid (anti-inflammatory medicine).  The local anesthetics may cause temporary numbness and weakness of the legs or arms, depending on the location of the block. This numbness/weakness may last 4-6 hours, depending on the local anesthetic used. In rare instances, it can last up to 24 hours. While numb, you must be very careful not to injure the extremity.  After any procedure, you could expect the pain to get better within 15-20 minutes. This relief is temporary and may last 4-6 hours. Once the local anesthetics wears off, you could experience discomfort, possibly more than usual, for up to 10 (ten) days. In the case of radiofrequencies, it may last up to 6 weeks. Surgeries may take up to 8 weeks for the healing process. The discomfort is due to the irritation caused by needles going through skin and muscle. To minimize the discomfort, we recommend using ice the first day, and heat from then on. The ice should be applied for 15 minutes on, and 15 minutes off. Keep repeating this cycle until bedtime. Avoid applying the ice directly to the skin, to prevent frostbite. Heat should be used daily, until the pain improves (4-10 days). Be careful not to burn yourself.  Occasionally you may experience muscle spasms or cramps. These occur as a consequence of the irritation caused by the needle sticks to the muscle and the blood that will inevitably be lost into the surrounding muscle tissue. Blood tends to be very irritating to tissues, which tend to react by going into spasm. These spasms may start the same day of your procedure, but they may also take days to develop. This late onset type of spasm or cramp is usually caused by electrolyte imbalances triggered by the steroids, at the level of the  kidney. Cramps and spasms tend to respond well to muscle relaxants, multivitamins (some are triggered by the procedure, but may have their origins in vitamin deficiencies), and "Gatorade", or any sports drinks that can replenish any electrolyte imbalances. (If you are a diabetic, ask your pharmacist to get you a sugar-free brand.) Warm showers or baths may also be helpful. Stretching exercises are highly recommended.  General Instructions:  Be alert for signs of possible infection: redness, swelling, heat, red streaks, elevated temperature, and/or fever. These typically appear 4 to 6 days after the procedure. Immediately notify your doctor if you experience unusual bleeding, difficulty breathing, or loss of bowel or bladder control. If you experience increased pain, do not increase your pain medicine intake, unless instructed by your pain physician.  Post-Procedure Care:  Be careful in moving about. Muscle spasms in the area of the injection may occur. Applying ice or heat to the area is often helpful. The incidence of spinal headaches after epidural injections ranges between 1.4% and 6%. If you develop a headache that does not seem to respond to conservative therapy, please let your physician know. This can be treated with an epidural blood patch.   Post-procedure numbness or redness is to be expected, however it should average 4 to 6 hours. If numbness and weakness of your extremities begins to develop 4 to 6 hours after your procedure, and is felt to be progressing and worsening, immediately contact your physician.  Diet:  If you experience nausea, do not eat until this sensation goes away. If you had a "Stellate Ganglion Block" for upper extremity "Reflex Sympathetic Dystrophy", do not eat or   drink until your hoarseness goes away. In any case, always start with liquids first and if you tolerate them well, then slowly progress to more solid foods.  Activity:  For the first 4 to 6 hours after the  procedure, use caution in moving about as you may experience numbness and/or weakness. Use caution in cooking, using household electrical appliances, and climbing steps. If you need to reach your Doctor call our office: [phone] (During business hours) or [phone] (After business hours).  Business Hours: Monday-Thursday 8:00 am - 4:00 PM    Fridays: Closed     In case of an emergency: In case of emergency, call 911 or go to the nearest emergency room and have the physician there call us.  Interpretation of Procedure Every nerve block has two components: a diagnostic component, and a treatment component. Unrealistic expectations are the most common causes of "perceived failure".  In a perfect world, a single nerve block should be able to completely and permanently eliminate the pain. Sadly, the world is not perfect.  Most pain management nerve blocks are performed using local anesthetics and steroids. Steroids are responsible for any long-term benefit that you may experience. Their purpose is to decrease any chronic swelling that may exist in the area. Steroids begin to work immediately after being injected. However, most patients will not experience any benefits until 5 to 10 days after the injection, when the swelling has come down to the point where they can tell a difference. Steroids will only help if there is swelling to be treated. As such, they can assist with the diagnosis. If effective, they suggest an inflammatory component to the pain, and if ineffective, they rule out inflammation as the main cause or component of the problem. If the problem is one of mechanical compression, you will get no benefit from those steroids.   In the case of local anesthetics, they have a crucial role in the diagnosis of your condition. Most will begin to work within15 to 20 minutes after injection. The duration will depend on the type used (short- vs. Long-acting). It is of outmost importance that  patients keep tract of their pain, after the procedure. To assist with this matter, a "Post-procedure Pain Diary" is provided. Make sure to complete it and to bring it back to your follow-up appointment.  As long as the patient keeps accurate, detailed records of their symptoms after every procedure, and returns to have those interpreted, every procedure will provide us with invaluable information. Even a block that does not provide the patient with any relief, will always provide us with information about the mechanism and the origin of the pain. The only time a nerve block can be considered a waste of time is when patients do not keep track of the results, or do not keep their post-procedure appointment.  Reporting the results back to your physician The Pain Score  Pain is a subjective complaint. It cannot be seen, touched, or measured. We depend entirely on the patient's report of the pain in order to assess your condition and treatment. To evaluate the pain, we use a pain scale, where "0" means "No Pain", and a "10" is "the worst possible pain that you can even imagine" (i.e. something like been eaten alive by a shark or being torn apart by a lion).   Use the Pain Scale provided. You will frequently be asked to rate your pain. Please be accurate, remember that medical decisions will be based on your   responses. Please do not rate your pain above a 10. Doing so is actually interpreted as "symptom magnification" (exaggeration). To put this into perspective, when you tell us that your pain is at a 10 (ten), what you are saying is that there is nothing we can do to make this pain any worse. (Carefully think about that.) ____________________________________________________________________________________________   Pain Management Discharge Instructions  General Discharge Instructions :  If you need to reach your doctor call: Monday-Friday 8:00 am - 4:00 pm at [phone] or toll free [phone].   After clinic hours [phone] to have operator reach doctor.  Bring all of your medication bottles to all your appointments in the pain clinic.  To cancel or reschedule your appointment with Pain Management please remember to call 24 hours in advance to avoid a fee.  Refer to the educational materials which you have been given on: General Risks, I had my Procedure. Discharge Instructions, Post Sedation.  Post Procedure Instructions:  The drugs you were given will stay in your system until tomorrow, so for the next 24 hours you should not drive, make any legal decisions or drink any alcoholic beverages.  You may eat anything you prefer, but it is better to start with liquids then soups and crackers, and gradually work up to solid foods.  Please notify your doctor immediately if you have any unusual bleeding, trouble breathing or pain that is not related to your normal pain.  Depending on the type of procedure that was done, some parts of your body may feel week and/or numb.  This usually clears up by tonight or the next day.  Walk with the use of an assistive device or accompanied by an adult for the 24 hours.  You may use ice on the affected area for the first 24 hours.  Put ice in a Ziploc bag and cover with a towel and place against area 15 minutes on 15 minutes off.  You may switch to heat after 24 hours.

## 2018-03-05 NOTE — Progress Notes (Signed)
Safety precautions to be maintained throughout the outpatient stay will include: orient to surroundings, keep bed in low position, maintain call bell within reach at all times, provide assistance with transfer out of bed and ambulation.  

## 2018-03-06 ENCOUNTER — Telehealth: Payer: Self-pay

## 2018-03-06 ENCOUNTER — Ambulatory Visit: Payer: 59

## 2018-03-06 ENCOUNTER — Other Ambulatory Visit: Payer: Self-pay

## 2018-03-06 ENCOUNTER — Telehealth: Payer: Self-pay | Admitting: Student in an Organized Health Care Education/Training Program

## 2018-03-06 DIAGNOSIS — M25562 Pain in left knee: Secondary | ICD-10-CM | POA: Diagnosis not present

## 2018-03-06 DIAGNOSIS — M25662 Stiffness of left knee, not elsewhere classified: Secondary | ICD-10-CM

## 2018-03-06 DIAGNOSIS — Z9181 History of falling: Secondary | ICD-10-CM

## 2018-03-06 DIAGNOSIS — G8929 Other chronic pain: Secondary | ICD-10-CM

## 2018-03-06 NOTE — Telephone Encounter (Signed)
No answer. Left message on am to call if needed. 

## 2018-03-06 NOTE — Telephone Encounter (Signed)
Dr. Lateef, please advise. 

## 2018-03-06 NOTE — Therapy (Signed)
[place] MAIN [facility] SERVICES [address] [place], [place], [address] Phone: [phone]   Fax:  [phone]  Physical Therapy Treatment  Patient Details  Name: Anna Mccarthy MRN: [id number] Date of Birth: December 02, 1955 No data recorded  Encounter Date: 03/06/2018  PT End of Session - 03/06/18 1215    Visit Number  5    Number of Visits  14    Date for PT Re-Evaluation  03/29/18    PT Start Time  4401    PT Stop Time  1115    PT Time Calculation (min)  60 min    Activity Tolerance  Patient tolerated treatment well    Behavior During Therapy  [facility] for tasks assessed/performed       Past Medical History:  Diagnosis Date  . Allergy   . Anxiety   . Arthritis   . Bronchitis   . Cancer ([place])    skin  . GERD (gastroesophageal reflux disease)   . Hypertension     Past Surgical History:  Procedure Laterality Date  . ABDOMINAL HYSTERECTOMY  1996  . BREAST BIOPSY Left 2011   stereo done by dr. Bary Castilla   . COLONOSCOPY    . COLONOSCOPY WITH PROPOFOL N/A 05/30/2016   Procedure: COLONOSCOPY WITH PROPOFOL;  Surgeon: Lollie Sails, MD;  Location: [facility] ENDOSCOPY;  Service: Endoscopy;  Laterality: N/A;  . HERNIA REPAIR  2012  . KNEE ARTHROSCOPY WITH MEDIAL MENISECTOMY Left 02/18/2016   Procedure: KNEE ARTHROSCOPY WITH PARTIAL MEDIAL MENISECTOMY;  Surgeon: Hessie Knows, MD;  Location: [facility] ORS;  Service: Orthopedics;  Laterality: Left;  . TUBAL LIGATION  1984    There were no vitals filed for this visit.  Subjective Assessment - 03/06/18 1204    Subjective  Pt reports having a nerve block treatment yesterday (states cleared for treatment today). Pt states she had terrible headache last night and continues 2-3 today; overall feeling not quite herself, but eager to get into pool. Agreed for conservative treatment.     Pertinent History  meniscus repair 3 years ago, fibromyalgia      Ambulation with upper chest/back stretching, 6 L ea  Fwd  with arm drag  Bkwd with arm drag  Side with opposite arm drag  LE/core strength  hip abd/add kicks, 30x ea  hip flex/ext kicks, 30x ea  Squats, 30x  Step  Step up, 1 hand held support; R/L 30x ea  Stabilized SL strength with core work  stand single leg tuck with arms, B 30x ea  Mitts resisted walk  Side step 4 L  Side step with squat 4 L  Walk with arm drag, 1 fwd/1 bkwd  Hip flexor stretching B at step 4 x ea                         PT Education - 03/06/18 1214    Education Details  stand abdominal work with LE stabilization/balance    Person(s) Educated  Patient    Methods  Explanation;Demonstration    Comprehension  Verbalized understanding;Returned demonstration;Verbal cues required          PT Long Term Goals - 02/15/18 0805      PT LONG TERM GOAL #1   Title  Pt will be independent and compliant with HEP in order to maintain gins made in physical therapy and return to prior level of function.    Baseline  02/14/18: dependent on form and technique  Time  4    Period  Weeks    Status  New    Target Date  03/15/18      PT LONG TERM GOAL #2   Title  Pt will reports worst pain in past week <6/10 in order to increase tolerance to amb and return to prior level of function.    Baseline  02/14/18: worst pain 8/10    Time  6    Period  Weeks    Status  New    Target Date  03/29/18      PT LONG TERM GOAL #3   Title  Pt will improve 6MWT distance from 1310m to 1586m with no increase in pain >1 on NPRS in order to demonstrate increased endurance, increased tolerance to long distance amb and to return to prior level of function.    Baseline  02/14/18: 1370 ft, pain increased to 7/10 at 3 minutes    Time  6    Period  Weeks    Status  New    Target Date  03/29/18      PT LONG TERM GOAL #4   Title  Pt will improve FOTO score from 30 to 51 in order to improve functional mobility and return to prior level of function.    Baseline  02/14/18:     Time  6    Period  Weeks    Status  New    Target Date  03/29/18      PT LONG TERM GOAL #5   Title  Pt will ascend/descend steps with reciprocal pattern and no increase in pain in order to increase independence and safety while ambulating in the community.     Baseline  02/14/18: step to pattern, one handrail, incr pain    Time  6    Period  Weeks    Status  New            Plan - 03/06/18 1215    Clinical Impression Statement  Pt tolerated session well without increase in HA or LE pain. Kept session more conservative due to current symptoms. Pt did tolerate increased last session very well. Encouraged hip flexor stretching in pool and at home. Continue to progress.     Rehab Potential  Fair    Clinical Impairments Affecting Rehab Potential  (+)motivated, family support, active lifestyle (-)multiple chronic conditions     PT Frequency  2x / week    PT Duration  6 weeks    PT Treatment/Interventions  ADLs/Self Care Home Management;Aquatic Therapy;Biofeedback;Cryotherapy;Electrical Stimulation;Gait training;Ultrasound;Moist Heat;Stair training;Functional mobility training;Therapeutic activities;Therapeutic exercise;Balance training;Neuromuscular re-education;Manual techniques;Patient/family education;Passive range of motion;Dry needling    PT Next Visit Plan  balance asssessment, joint mobs, squat, TKE, lateral step ups    PT Home Exercise Plan  See education section    Consulted and Agree with Plan of Care  Patient       Patient will benefit from skilled therapeutic intervention in order to improve the following deficits and impairments:  Abnormal gait, Decreased range of motion, Difficulty walking, Decreased activity tolerance, Pain, Decreased balance, Decreased strength  Visit Diagnosis: Chronic pain of left knee  History of falling  Stiffness of left knee, not elsewhere classified     Problem List Patient Active Problem List   Diagnosis Date Noted  . Venous ulcer ([place])  02/02/2017  . Varicose veins of bilateral lower extremities with pain 05/16/2016  . Chronic venous insufficiency 05/16/2016  . Pain in limb 05/16/2016  . Swelling  of limb 05/16/2016  . DJD (degenerative joint disease) 05/16/2016    Larae Grooms 03/06/2018, 12:17 PM  [place] MAIN [facility] SERVICES [address] [place], [place], [address] Phone: [phone]   Fax:  [phone]  Name: SELIA WAREING MRN: [id number] Date of Birth: 1955/12/31

## 2018-03-06 NOTE — Telephone Encounter (Signed)
Dr. Holley Raring was giving patient Voltaren, she only has one more week left, does she need to go back on Celebrex?

## 2018-03-08 ENCOUNTER — Encounter: Payer: Self-pay | Admitting: Student in an Organized Health Care Education/Training Program

## 2018-03-08 ENCOUNTER — Ambulatory Visit: Payer: 59

## 2018-03-08 DIAGNOSIS — M25562 Pain in left knee: Secondary | ICD-10-CM | POA: Diagnosis not present

## 2018-03-11 ENCOUNTER — Other Ambulatory Visit: Payer: Self-pay

## 2018-03-11 NOTE — Therapy (Signed)
[place] MAIN [facility] SERVICES [address] [place], [place], [address] Phone: [phone]   Fax:  [phone]  Physical Therapy Treatment  Patient Details  Name: Anna Mccarthy MRN: [id number] Date of Birth: April 16, 1956 No data recorded  Encounter Date: 03/08/2018  PT End of Session - 03/11/18 0839    Visit Number  6    Number of Visits  14    Date for PT Re-Evaluation  03/29/18    PT Start Time  1215    PT Stop Time  1320    PT Time Calculation (min)  65 min    Activity Tolerance  Patient tolerated treatment well    Behavior During Therapy  [facility] for tasks assessed/performed       Past Medical History:  Diagnosis Date  . Allergy   . Anxiety   . Arthritis   . Bronchitis   . Cancer ([place])    skin  . GERD (gastroesophageal reflux disease)   . Hypertension     Past Surgical History:  Procedure Laterality Date  . ABDOMINAL HYSTERECTOMY  1996  . BREAST BIOPSY Left 2011   stereo done by dr. Bary Castilla   . COLONOSCOPY    . COLONOSCOPY WITH PROPOFOL N/A 05/30/2016   Procedure: COLONOSCOPY WITH PROPOFOL;  Surgeon: Lollie Sails, MD;  Location: [facility] ENDOSCOPY;  Service: Endoscopy;  Laterality: N/A;  . HERNIA REPAIR  2012  . KNEE ARTHROSCOPY WITH MEDIAL MENISECTOMY Left 02/18/2016   Procedure: KNEE ARTHROSCOPY WITH PARTIAL MEDIAL MENISECTOMY;  Surgeon: Hessie Knows, MD;  Location: [facility] ORS;  Service: Orthopedics;  Laterality: Left;  . TUBAL LIGATION  1984    There were no vitals filed for this visit.  Subjective Assessment - 03/11/18 0837    Subjective  Pt denies problems post last session; notes feeling much better regarding HA/symptoms pt experiencing from nerve block. Feels able to progress session. Denies pain currently    Pertinent History  meniscus repair 3 years ago, fibromyalgia      Ambulation  2 L fwd  2 L side  2 L side with minisquat  Lunges, red dumbbells, 20x ea B  fwd  side  Suspended cardio round, primarily  scully arms  Jog  Jack  ski  X jack  moguls  2 L recovery walk with backward thumb up arm drag  Squats, require increased ed, 25x  Bench  Seated supernoodle ham ext, 20x ea  Plank  fwd 1 min  fwd with slow jack, 30 sec, 2x  fwd with slow knee tuck, 30 sec, 2x  Suspended cardio round as above with 2 L recovery   Side plank, B 2 x 30 sec  Active stretch  Ham/quad and hip flexor  Gastroc  Hip flexor at step                         PT Education - 03/11/18 0838    Education Details  suspended work, side and fwd lunges, continued for proper squat form, planks fwd and side          PT Long Term Goals - 02/15/18 0805      PT LONG TERM GOAL #1   Title  Pt will be independent and compliant with HEP in order to maintain gins made in physical therapy and return to prior level of function.    Baseline  02/14/18: dependent on form and technique     Time  4  Period  Weeks    Status  New    Target Date  03/15/18      PT LONG TERM GOAL #2   Title  Pt will reports worst pain in past week <6/10 in order to increase tolerance to amb and return to prior level of function.    Baseline  02/14/18: worst pain 8/10    Time  6    Period  Weeks    Status  New    Target Date  03/29/18      PT LONG TERM GOAL #3   Title  Pt will improve 6MWT distance from 1332m to 153m with no increase in pain >1 on NPRS in order to demonstrate increased endurance, increased tolerance to long distance amb and to return to prior level of function.    Baseline  02/14/18: 1370 ft, pain increased to 7/10 at 3 minutes    Time  6    Period  Weeks    Status  New    Target Date  03/29/18      PT LONG TERM GOAL #4   Title  Pt will improve FOTO score from 30 to 51 in order to improve functional mobility and return to prior level of function.    Baseline  02/14/18:    Time  6    Period  Weeks    Status  New    Target Date  03/29/18      PT LONG TERM GOAL #5   Title  Pt will  ascend/descend steps with reciprocal pattern and no increase in pain in order to increase independence and safety while ambulating in the community.     Baseline  02/14/18: step to pattern, one handrail, incr pain    Time  6    Period  Weeks    Status  New            Plan - 03/11/18 0840    Clinical Impression Statement  Tolerated session well; tolerated 2 rounds of suspended cardio/strength work. Requires tactile cues for planks. Continue to progress    Rehab Potential  Fair    Clinical Impairments Affecting Rehab Potential  (+)motivated, family support, active lifestyle (-)multiple chronic conditions     PT Frequency  2x / week    PT Duration  6 weeks    PT Treatment/Interventions  ADLs/Self Care Home Management;Aquatic Therapy;Biofeedback;Cryotherapy;Electrical Stimulation;Gait training;Ultrasound;Moist Heat;Stair training;Functional mobility training;Therapeutic activities;Therapeutic exercise;Balance training;Neuromuscular re-education;Manual techniques;Patient/family education;Passive range of motion;Dry needling    PT Next Visit Plan  balance asssessment, joint mobs, squat, TKE, lateral step ups    PT Home Exercise Plan  See education section    Consulted and Agree with Plan of Care  Patient       Patient will benefit from skilled therapeutic intervention in order to improve the following deficits and impairments:  Abnormal gait, Decreased range of motion, Difficulty walking, Decreased activity tolerance, Pain, Decreased balance, Decreased strength  Visit Diagnosis: History of falling  Chronic pain of left knee  Stiffness of left knee, not elsewhere classified     Problem List Patient Active Problem List   Diagnosis Date Noted  . Venous ulcer ([place]) 02/02/2017  . Varicose veins of bilateral lower extremities with pain 05/16/2016  . Chronic venous insufficiency 05/16/2016  . Pain in limb 05/16/2016  . Swelling of limb 05/16/2016  . DJD (degenerative joint disease)  05/16/2016    Larae Grooms 03/11/2018, 8:43 AM  [place] [facility]  SERVICES [place], [place], [address] Phone: [phone]   Fax:  [phone]  Name: Anna Mccarthy MRN: [id number] Date of Birth: 1955-08-20

## 2018-03-12 ENCOUNTER — Ambulatory Visit: Payer: 59

## 2018-03-12 DIAGNOSIS — M25662 Stiffness of left knee, not elsewhere classified: Secondary | ICD-10-CM

## 2018-03-12 DIAGNOSIS — G8929 Other chronic pain: Secondary | ICD-10-CM

## 2018-03-12 DIAGNOSIS — Z9181 History of falling: Secondary | ICD-10-CM

## 2018-03-12 DIAGNOSIS — M25562 Pain in left knee: Secondary | ICD-10-CM

## 2018-03-12 NOTE — Therapy (Signed)
[place] [facility] [address], [place], [address] Phone: [phone]   Fax:  [phone]  Physical Therapy Treatment  Patient Details  Name: Anna Mccarthy MRN: [id number] Date of Birth: [birth year] No data recorded  Encounter Date: 03/12/2018  PT End of Session - 03/12/18 1314    Visit Number  7    Number of Visits  14    Date for PT Re-Evaluation  03/29/18    PT Start Time  9675    PT Stop Time  1345    PT Time Calculation (min)  40 min    Activity Tolerance  Patient tolerated treatment well    Behavior During Therapy  [facility] for tasks assessed/performed       Past Medical History:  Diagnosis Date  . Allergy   . Anxiety   . Arthritis   . Bronchitis   . Cancer ([place])    skin  . GERD (gastroesophageal reflux disease)   . Hypertension     Past Surgical History:  Procedure Laterality Date  . ABDOMINAL HYSTERECTOMY  1996  . BREAST BIOPSY Left 2011   stereo done by dr. Bary Castilla   . COLONOSCOPY    . COLONOSCOPY WITH PROPOFOL N/A 05/30/2016   Procedure: COLONOSCOPY WITH PROPOFOL;  Surgeon: Lollie Sails, MD;  Location: [facility] ENDOSCOPY;  Service: Endoscopy;  Laterality: N/A;  . HERNIA REPAIR  2012  . KNEE ARTHROSCOPY WITH MEDIAL MENISECTOMY Left 02/18/2016   Procedure: KNEE ARTHROSCOPY WITH PARTIAL MEDIAL MENISECTOMY;  Surgeon: Hessie Knows, MD;  Location: [facility] ORS;  Service: Orthopedics;  Laterality: Left;  . TUBAL LIGATION  1984    There were no vitals filed for this visit.  Subjective Assessment - 03/12/18 1308    Subjective  Patient reports the aquatic therapy has been helping and states everything has been helpful. She reports planks has been difficult to perform.     Pertinent History  meniscus repair 3 years ago, fibromyalgia    Patient Stated Goals  decr pain, be able to walk without pain, increase leg strength    Currently in Pain?  No/denies        TREATMENT: Therapeutic Exercise: Hip  abduction in standing - 2 x 15 40# at hip machine  Single leg stance on airex pad - 2 x 30sec  Hip abduction in standing with slider - x 20 B Running man in standing with UE support with slider - x 20  Step ups onto 6" step - x 20 on the R LE Side stepping onto 6" step - x10 (stopped secondary to increased pain) Hip Hiking - x 20 off of Nustep level 4 (seat level 8) - x 25min; level 5 for 2 min to improve strengthening    Patient demonstrates increased fatigue at the end of the session   PT Education - 03/12/18 1314    Education Details  form/technique with exercise    Person(s) Educated  Patient    Methods  Explanation;Demonstration    Comprehension  Verbalized understanding;Returned demonstration          PT Long Term Goals - 02/15/18 0805      PT LONG TERM GOAL #1   Title  Pt will be independent and compliant with HEP in order to maintain gins made in physical therapy and return to prior level of function.    Baseline  02/14/18: dependent on form and technique     Time  4    Period  Weeks    Status  New    Target Date  03/15/18      PT LONG TERM GOAL #2   Title  Pt will reports worst pain in past week <6/10 in order to increase tolerance to amb and return to prior level of function.    Baseline  02/14/18: worst pain 8/10    Time  6    Period  Weeks    Status  New    Target Date  03/29/18      PT LONG TERM GOAL #3   Title  Pt will improve 6MWT distance from 1366m to 1510m with no increase in pain >1 on NPRS in order to demonstrate increased endurance, increased tolerance to long distance amb and to return to prior level of function.    Baseline  02/14/18: 1370 ft, pain increased to 7/10 at 3 minutes    Time  6    Period  Weeks    Status  New    Target Date  03/29/18      PT LONG TERM GOAL #4   Title  Pt will improve FOTO score from 30 to 51 in order to improve functional mobility and return to prior level of function.    Baseline  02/14/18:    Time  6    Period   Weeks    Status  New    Target Date  03/29/18      PT LONG TERM GOAL #5   Title  Pt will ascend/descend steps with reciprocal pattern and no increase in pain in order to increase independence and safety while ambulating in the community.     Baseline  02/14/18: step to pattern, one handrail, incr pain    Time  6    Period  Weeks    Status  New            Plan - 03/12/18 1318    Clinical Impression Statement  Patient demosntrates improvement with single elg stanxce and performing squatting motions compared to previous treatment sessions indicating funcitonal carryover between sessions. Patient continues to require cueing on knee control with performing squatting motins indcating poor coordination. Patient will benefit from further skilled therapy to return to prior level of function.     Rehab Potential  Fair    Clinical Impairments Affecting Rehab Potential  (+)motivated, family support, active lifestyle (-)multiple chronic conditions     PT Frequency  2x / week    PT Duration  6 weeks    PT Treatment/Interventions  ADLs/Self Care Home Management;Aquatic Therapy;Biofeedback;Cryotherapy;Electrical Stimulation;Gait training;Ultrasound;Moist Heat;Stair training;Functional mobility training;Therapeutic activities;Therapeutic exercise;Balance training;Neuromuscular re-education;Manual techniques;Patient/family education;Passive range of motion;Dry needling    PT Next Visit Plan  balance asssessment, joint mobs, squat, TKE, lateral step ups    PT Home Exercise Plan  See education section    Consulted and Agree with Plan of Care  Patient       Patient will benefit from skilled therapeutic intervention in order to improve the following deficits and impairments:  Abnormal gait, Decreased range of motion, Difficulty walking, Decreased activity tolerance, Pain, Decreased balance, Decreased strength  Visit Diagnosis: History of falling  Chronic pain of left knee  Stiffness of left knee, not  elsewhere classified     Problem List Patient Active Problem List   Diagnosis Date Noted  . Venous ulcer ([place]) 02/02/2017  . Varicose veins of bilateral lower extremities with pain 05/16/2016  . Chronic venous insufficiency 05/16/2016  . Pain in limb 05/16/2016  .  Swelling of limb 05/16/2016  . DJD (degenerative joint disease) 05/16/2016    Blythe Stanford, PT DPT 03/12/2018, 1:37 PM  [place] [facility] [address], [place], [address] Phone: [phone]   Fax:  [phone]  Name: Anna Mccarthy MRN: [id number] Date of Birth: 12-29-1955

## 2018-03-15 ENCOUNTER — Ambulatory Visit: Payer: 59

## 2018-03-15 ENCOUNTER — Other Ambulatory Visit: Payer: Self-pay

## 2018-03-15 DIAGNOSIS — M25562 Pain in left knee: Secondary | ICD-10-CM

## 2018-03-15 DIAGNOSIS — G8929 Other chronic pain: Secondary | ICD-10-CM

## 2018-03-15 DIAGNOSIS — M25662 Stiffness of left knee, not elsewhere classified: Secondary | ICD-10-CM

## 2018-03-15 DIAGNOSIS — Z9181 History of falling: Secondary | ICD-10-CM

## 2018-03-15 NOTE — Therapy (Signed)
[place] [facility] SERVICES [address] [place], [place], [address] Phone: [phone]   Fax:  [phone]  Physical Therapy Treatment  Patient Details  Name: Anna Mccarthy MRN: [id number] Date of Birth: [birth year] No data recorded  Encounter Date: 03/15/2018  PT End of Session - 03/15/18 1505    Visit Number  8    Number of Visits  14    Date for PT Re-Evaluation  03/29/18    PT Start Time  1045    PT Stop Time  1200    PT Time Calculation (min)  75 min    Activity Tolerance  Patient tolerated treatment well    Behavior During Therapy  [facility] for tasks assessed/performed       Past Medical History:  Diagnosis Date  . Allergy   . Anxiety   . Arthritis   . Bronchitis   . Cancer ([place])    skin  . GERD (gastroesophageal reflux disease)   . Hypertension     Past Surgical History:  Procedure Laterality Date  . ABDOMINAL HYSTERECTOMY  1996  . BREAST BIOPSY Left 2011   stereo done by dr. Bary Castilla   . COLONOSCOPY    . COLONOSCOPY WITH PROPOFOL N/A 05/30/2016   Procedure: COLONOSCOPY WITH PROPOFOL;  Surgeon: Lollie Sails, MD;  Location: [facility] ENDOSCOPY;  Service: Endoscopy;  Laterality: N/A;  . HERNIA REPAIR  2012  . KNEE ARTHROSCOPY WITH MEDIAL MENISECTOMY Left 02/18/2016   Procedure: KNEE ARTHROSCOPY WITH PARTIAL MEDIAL MENISECTOMY;  Surgeon: Hessie Knows, MD;  Location: [facility] ORS;  Service: Orthopedics;  Laterality: Left;  . TUBAL LIGATION  1984    There were no vitals filed for this visit.  Subjective Assessment - 03/15/18 1459    Subjective  Pt reports that modified plank work may have cause her some discomfort in her upper back post last aquatic session; pt does also note playing on the floor and lifting her grandchildren, which may have contributed as well (very likely). Pt notes some continued tightness/discomfort noted throughout this region and does state this has been a off/on chronic issue. Denies any L knee pain       Enters/exits via ramp  Ambulation warm up  4 L fwd  2 L side   2 L side with minisquat  Chi gong/Tai Chi acute phase gestures, 15 min  Accordian breathing  [place] the table  ONEOK   10x no resist  20x supernoodle  Fall to recover, supernoodle  Fwd 4 L  Side, B 2 L ea  Suspended work 5 min, 1 min ea  Jog   Jack   Ski  X jack  [place]  2 L recovery with backward arm drag  B side lunges with supernoodle, 25x ea  Suspended work as above followed by 2 L recovery  B knee drivers with super noodle, 10x without noodle, 20x with each  Seated stretching  Hams/gastroc  SKTC  Cross body piriformis   Fig 4 piriformis                          PT Education - 03/15/18 1510    Education Details  Fall to recover for abdominal strength, Forward knee drivers          PT Long Term Goals - 02/15/18 0805      PT LONG TERM GOAL #1   Title  Pt will be independent and compliant  with HEP in order to maintain gins made in physical therapy and return to prior level of function.    Baseline  02/14/18: dependent on form and technique     Time  4    Period  Weeks    Status  New    Target Date  03/15/18      PT LONG TERM GOAL #2   Title  Pt will reports worst pain in past week <6/10 in order to increase tolerance to amb and return to prior level of function.    Baseline  02/14/18: worst pain 8/10    Time  6    Period  Weeks    Status  New    Target Date  03/29/18      PT LONG TERM GOAL #3   Title  Pt will improve 6MWT distance from 1352mto 1503mith no increase in pain >1 on NPRS in order to demonstrate increased endurance, increased tolerance to long distance amb and to return to prior level of function.    Baseline  02/14/18: 1370 ft, pain increased to 7/10 at 3 minutes    Time  6    Period  Weeks    Status  New    Target Date  03/29/18      PT LONG TERM GOAL #4   Title  Pt will improve FOTO score from 30 to 51 in order to  improve functional mobility and return to prior level of function.    Baseline  02/14/18:    Time  6    Period  Weeks    Status  New    Target Date  03/29/18      PT LONG TERM GOAL #5   Title  Pt will ascend/descend steps with reciprocal pattern and no increase in pain in order to increase independence and safety while ambulating in the community.     Baseline  02/14/18: step to pattern, one handrail, incr pain    Time  6    Period  Weeks    Status  New            Plan - 03/15/18 1506    Clinical Impression Statement  Pt tolerated session well. Noted improved relaxation/postural relief with addition of Chigong/Tai Chi gestures. Noted good core strengthening/awareness with fall to recover work. Improved suspended/cardio work technique and endurance. Greater ability to perform compound strengtening movement incorporating multiple muscle strengthening, coordination and balance. Continue to progress,    Rehab Potential  Fair    Clinical Impairments Affecting Rehab Potential  (+)motivated, family support, active lifestyle (-)multiple chronic conditions     PT Frequency  2x / week    PT Duration  6 weeks    PT Treatment/Interventions  ADLs/Self Care Home Management;Aquatic Therapy;Biofeedback;Cryotherapy;Electrical Stimulation;Gait training;Ultrasound;Moist Heat;Stair training;Functional mobility training;Therapeutic activities;Therapeutic exercise;Balance training;Neuromuscular re-education;Manual techniques;Patient/family education;Passive range of motion;Dry needling    PT Next Visit Plan  balance asssessment, joint mobs, squat, TKE, lateral step ups    PT Home Exercise Plan  See education section    Consulted and Agree with Plan of Care  Patient       Patient will benefit from skilled therapeutic intervention in order to improve the following deficits and impairments:  Abnormal gait, Decreased range of motion, Difficulty walking, Decreased activity tolerance, Pain, Decreased balance,  Decreased strength  Visit Diagnosis: History of falling  Chronic pain of left knee  Stiffness of left knee, not elsewhere classified     Problem List Patient Active  Problem List   Diagnosis Date Noted  . Venous ulcer ([place]) 02/02/2017  . Varicose veins of bilateral lower extremities with pain 05/16/2016  . Chronic venous insufficiency 05/16/2016  . Pain in limb 05/16/2016  . Swelling of limb 05/16/2016  . DJD (degenerative joint disease) 05/16/2016    Larae Grooms 03/15/2018, 3:11 PM  [place] [facility] SERVICES [address] [place], [place], [address] Phone: [phone]   Fax:  [phone]  Name: LOUGENIA MORRISSEY MRN: [id number] Date of Birth: [birth year]

## 2018-03-19 ENCOUNTER — Ambulatory Visit: Payer: 59

## 2018-03-19 DIAGNOSIS — G8929 Other chronic pain: Secondary | ICD-10-CM

## 2018-03-19 DIAGNOSIS — Z9181 History of falling: Secondary | ICD-10-CM

## 2018-03-19 DIAGNOSIS — M25562 Pain in left knee: Secondary | ICD-10-CM

## 2018-03-19 DIAGNOSIS — M25662 Stiffness of left knee, not elsewhere classified: Secondary | ICD-10-CM

## 2018-03-19 NOTE — Therapy (Signed)
[place] [facility] [address] [place], [address] Phone: [phone]   Fax:  [phone]  Physical Therapy Treatment  Patient Details  Name: Anna Mccarthy MRN: [id number] Date of Birth: 24-Jul-1955 No data recorded  Encounter Date: 03/19/2018  PT End of Session - 03/19/18 1459    Visit Number  9    Number of Visits  14    Date for PT Re-Evaluation  03/29/18    PT Start Time  1435    PT Stop Time  1515    PT Time Calculation (min)  40 min    Activity Tolerance  Patient tolerated treatment well    Behavior During Therapy  [facility] for tasks assessed/performed       Past Medical History:  Diagnosis Date  . Allergy   . Anxiety   . Arthritis   . Bronchitis   . Cancer ([place])    skin  . GERD (gastroesophageal reflux disease)   . Hypertension     Past Surgical History:  Procedure Laterality Date  . ABDOMINAL HYSTERECTOMY  1996  . BREAST BIOPSY Left 2011   stereo done by dr. Bary Castilla   . COLONOSCOPY    . COLONOSCOPY WITH PROPOFOL N/A 05/30/2016   Procedure: COLONOSCOPY WITH PROPOFOL;  Surgeon: Lollie Sails, MD;  Location: [facility] ENDOSCOPY;  Service: Endoscopy;  Laterality: N/A;  . HERNIA REPAIR  2012  . KNEE ARTHROSCOPY WITH MEDIAL MENISECTOMY Left 02/18/2016   Procedure: KNEE ARTHROSCOPY WITH PARTIAL MEDIAL MENISECTOMY;  Surgeon: Hessie Knows, MD;  Location: [facility] ORS;  Service: Orthopedics;  Laterality: Left;  . TUBAL LIGATION  1984    There were no vitals filed for this visit.  Subjective Assessment - 03/19/18 1446    Subjective  Patient reports increased pain 12 days after her nerve block. But states the pain is not terribly intense and is only a 1/10.    Currently in Pain?  Yes    Pain Score  1     Pain Location  Knee    Pain Descriptors / Indicators  Aching    Pain Type  Chronic pain    Pain Onset  More than a month ago    Pain Frequency  Constant       TREATMENT: Therapeutic Exercise: Hip  abduction in standing - 2 x 15 40# at hip machine  Step downs off of 4" step heel taps - x 20  Sitting ball Knee AROM - x 2 min B Hip Hiking - x 20 off of 4" step  Side stepping onto 4" step - x10  Stepping lunges onto 4" step - x 10  Step ups onto 6" step - x 20 on the R LE Squats in standing - x 20 with UE support  Side stepping with YTB around knees - x 20  Nustep level 5 (seat level 8) - x 13min;     Patient demonstrates increased fatigue at the end of the session  PT Education - 03/19/18 1458    Education Details  form/technique with exercise    Person(s) Educated  Patient    Methods  Explanation;Demonstration    Comprehension  Verbalized understanding;Returned demonstration          PT Long Term Goals - 02/15/18 0805      PT LONG TERM GOAL #1   Title  Pt will be independent and compliant with HEP in order to maintain gins made in physical therapy and return to prior  level of function.    Baseline  02/14/18: dependent on form and technique     Time  4    Period  Weeks    Status  New    Target Date  03/15/18      PT LONG TERM GOAL #2   Title  Pt will reports worst pain in past week <6/10 in order to increase tolerance to amb and return to prior level of function.    Baseline  02/14/18: worst pain 8/10    Time  6    Period  Weeks    Status  New    Target Date  03/29/18      PT LONG TERM GOAL #3   Title  Pt will improve 6MWT distance from 1313m to 1532m with no increase in pain >1 on NPRS in order to demonstrate increased endurance, increased tolerance to long distance amb and to return to prior level of function.    Baseline  02/14/18: 1370 ft, pain increased to 7/10 at 3 minutes    Time  6    Period  Weeks    Status  New    Target Date  03/29/18      PT LONG TERM GOAL #4   Title  Pt will improve FOTO score from 30 to 51 in order to improve functional mobility and return to prior level of function.    Baseline  02/14/18:    Time  6    Period  Weeks    Status  New     Target Date  03/29/18      PT LONG TERM GOAL #5   Title  Pt will ascend/descend steps with reciprocal pattern and no increase in pain in order to increase independence and safety while ambulating in the community.     Baseline  02/14/18: step to pattern, one handrail, incr pain    Time  6    Period  Weeks    Status  New            Plan - 03/19/18 1513    Clinical Impression Statement  Continued to focus on improving LE and hip strengthening with exercises and patient demonstrates improvement with exercise performance requiring less rest breaks. Patient continues to have difficulty with stepping up and down stairs with increased pain with knee flexion under weight bearing conditions. Patient will benefit from further skilled therapy to return to prior level of function.     Rehab Potential  Fair    Clinical Impairments Affecting Rehab Potential  (+)motivated, family support, active lifestyle (-)multiple chronic conditions     PT Frequency  2x / week    PT Duration  6 weeks    PT Treatment/Interventions  ADLs/Self Care Home Management;Aquatic Therapy;Biofeedback;Cryotherapy;Electrical Stimulation;Gait training;Ultrasound;Moist Heat;Stair training;Functional mobility training;Therapeutic activities;Therapeutic exercise;Balance training;Neuromuscular re-education;Manual techniques;Patient/family education;Passive range of motion;Dry needling    PT Next Visit Plan  balance asssessment, joint mobs, squat, TKE, lateral step ups    PT Home Exercise Plan  See education section    Consulted and Agree with Plan of Care  Patient       Patient will benefit from skilled therapeutic intervention in order to improve the following deficits and impairments:  Abnormal gait, Decreased range of motion, Difficulty walking, Decreased activity tolerance, Pain, Decreased balance, Decreased strength  Visit Diagnosis: History of falling  Chronic pain of left knee  Stiffness of left knee, not elsewhere  classified     Problem List Patient Active Problem List  Diagnosis Date Noted  . Venous ulcer ([place]) 02/02/2017  . Varicose veins of bilateral lower extremities with pain 05/16/2016  . Chronic venous insufficiency 05/16/2016  . Pain in limb 05/16/2016  . Swelling of limb 05/16/2016  . DJD (degenerative joint disease) 05/16/2016    Blythe Stanford, PT DPT 03/19/2018, 3:23 PM  [place] [facility] [address] [place], [address] Phone: [phone]   Fax:  [phone]  Name: Anna Mccarthy MRN: [id number] Date of Birth: 13-Mar-1956

## 2018-03-22 ENCOUNTER — Ambulatory Visit: Payer: 59

## 2018-03-22 ENCOUNTER — Other Ambulatory Visit: Payer: Self-pay

## 2018-03-22 DIAGNOSIS — M25562 Pain in left knee: Secondary | ICD-10-CM | POA: Diagnosis not present

## 2018-03-22 DIAGNOSIS — Z9181 History of falling: Secondary | ICD-10-CM

## 2018-03-22 DIAGNOSIS — G8929 Other chronic pain: Secondary | ICD-10-CM

## 2018-03-22 DIAGNOSIS — M25662 Stiffness of left knee, not elsewhere classified: Secondary | ICD-10-CM

## 2018-03-22 NOTE — Therapy (Signed)
[place] MAIN [facility] SERVICES [address] [place], [place], [address] Phone: [phone]   Fax:  [phone]  Physical Therapy Treatment  Patient Details  Name: Anna Mccarthy MRN: [id number] Date of Birth: Jul 08, 1955 No data recorded  Encounter Date: 03/22/2018  PT End of Session - 03/22/18 1212    Visit Number  10    Number of Visits  14    Date for PT Re-Evaluation  03/29/18    PT Start Time  4235    PT Stop Time  1140    PT Time Calculation (min)  60 min    Activity Tolerance  Patient tolerated treatment well    Behavior During Therapy  [facility] for tasks assessed/performed       Past Medical History:  Diagnosis Date  . Allergy   . Anxiety   . Arthritis   . Bronchitis   . Cancer ([place])    skin  . GERD (gastroesophageal reflux disease)   . Hypertension     Past Surgical History:  Procedure Laterality Date  . ABDOMINAL HYSTERECTOMY  1996  . BREAST BIOPSY Left 2011   stereo done by dr. Bary Castilla   . COLONOSCOPY    . COLONOSCOPY WITH PROPOFOL N/A 05/30/2016   Procedure: COLONOSCOPY WITH PROPOFOL;  Surgeon: Lollie Sails, MD;  Location: [facility] ENDOSCOPY;  Service: Endoscopy;  Laterality: N/A;  . HERNIA REPAIR  2012  . KNEE ARTHROSCOPY WITH MEDIAL MENISECTOMY Left 02/18/2016   Procedure: KNEE ARTHROSCOPY WITH PARTIAL MEDIAL MENISECTOMY;  Surgeon: Hessie Knows, MD;  Location: [facility] ORS;  Service: Orthopedics;  Laterality: Left;  . TUBAL LIGATION  1984    There were no vitals filed for this visit.  Subjective Assessment - 03/22/18 1210    Subjective  Pt reports feeling better through back after greater attention to posutre/acitivity and restful/fun weekend. Pt does not 3-4/10 pain in L knee feeling that "injection has worn off some."      Enters/exits via ramp  Ambulation warm up  Fwd 2 L   Side 2 L   Side with squat 2 L  Resisted LE strength  Blue dumbbell (1), 30x ea   Squats   Fwd lunges, B    Side lunges, B  Bench   Cardio/strength (LE/core), 5 min ea   Bike   Scissor ( no crossing)   Flutter   SLS squat hold (pistol squat) with half circle taps, B 10x ea  Core with UE strenth  Mitts, 30x ea   Sh abd/add   Sh flex/ext   Sh horiz abd/add  Resisted (increased speed and Mitts) walking  Fwd 6 L (thumb up arm drag)  Side 6 L (elbows fwd flex to 90)  Active stretching, 3 x ea B  Ham/gastroc  Hip flexor/quad                         PT Education - 03/22/18 1211    Education Details  SL squat with half circles    Person(s) Educated  Patient    Methods  Explanation;Demonstration    Comprehension  Returned demonstration;Verbalized understanding          PT Long Term Goals - 02/15/18 0805      PT LONG TERM GOAL #1   Title  Pt will be independent and compliant with HEP in order to maintain gins made in physical therapy and return to prior level of function.    Baseline  02/14/18:  dependent on form and technique     Time  4    Period  Weeks    Status  New    Target Date  03/15/18      PT LONG TERM GOAL #2   Title  Pt will reports worst pain in past week <6/10 in order to increase tolerance to amb and return to prior level of function.    Baseline  02/14/18: worst pain 8/10    Time  6    Period  Weeks    Status  New    Target Date  03/29/18      PT LONG TERM GOAL #3   Title  Pt will improve 6MWT distance from 1367m to 1553m with no increase in pain >1 on NPRS in order to demonstrate increased endurance, increased tolerance to long distance amb and to return to prior level of function.    Baseline  02/14/18: 1370 ft, pain increased to 7/10 at 3 minutes    Time  6    Period  Weeks    Status  New    Target Date  03/29/18      PT LONG TERM GOAL #4   Title  Pt will improve FOTO score from 30 to 51 in order to improve functional mobility and return to prior level of function.    Baseline  02/14/18:    Time  6    Period  Weeks    Status  New    Target Date  03/29/18       PT LONG TERM GOAL #5   Title  Pt will ascend/descend steps with reciprocal pattern and no increase in pain in order to increase independence and safety while ambulating in the community.     Baseline  02/14/18: step to pattern, one handrail, incr pain    Time  6    Period  Weeks    Status  New            Plan - 03/22/18 1213    Clinical Impression Statement  Pt tolerated session well without pain (less pain in L knee). Focused on LE strength utilizing greater stability knee positions. Pt wishes to conintue with aquatic sessions feeling she is gaining strength and knowledge for improved body awareness and self care through safe exercise.    Rehab Potential  Fair    Clinical Impairments Affecting Rehab Potential  (+)motivated, family support, active lifestyle (-)multiple chronic conditions     PT Frequency  2x / week    PT Duration  6 weeks    PT Treatment/Interventions  ADLs/Self Care Home Management;Aquatic Therapy;Biofeedback;Cryotherapy;Electrical Stimulation;Gait training;Ultrasound;Moist Heat;Stair training;Functional mobility training;Therapeutic activities;Therapeutic exercise;Balance training;Neuromuscular re-education;Manual techniques;Patient/family education;Passive range of motion;Dry needling    PT Next Visit Plan  balance asssessment, joint mobs, squat, TKE, lateral step ups    PT Home Exercise Plan  See education section    Consulted and Agree with Plan of Care  Patient       Patient will benefit from skilled therapeutic intervention in order to improve the following deficits and impairments:  Abnormal gait, Decreased range of motion, Difficulty walking, Decreased activity tolerance, Pain, Decreased balance, Decreased strength  Visit Diagnosis: History of falling  Chronic pain of left knee  Stiffness of left knee, not elsewhere classified     Problem List Patient Active Problem List   Diagnosis Date Noted  . Venous ulcer ([place]) 02/02/2017  . Varicose veins  of bilateral lower extremities with pain 05/16/2016  .  Chronic venous insufficiency 05/16/2016  . Pain in limb 05/16/2016  . Swelling of limb 05/16/2016  . DJD (degenerative joint disease) 05/16/2016    Larae Grooms 03/22/2018, 12:18 PM  [place] MAIN [facility] SERVICES [address] [place], [place], [address] Phone: [phone]   Fax:  [phone]  Name: LAURALYN SHADOWENS MRN: [id number] Date of Birth: 06-Dec-1955

## 2018-03-26 ENCOUNTER — Ambulatory Visit: Payer: 59

## 2018-03-26 DIAGNOSIS — M25562 Pain in left knee: Secondary | ICD-10-CM

## 2018-03-26 DIAGNOSIS — Z9181 History of falling: Secondary | ICD-10-CM

## 2018-03-26 DIAGNOSIS — M25662 Stiffness of left knee, not elsewhere classified: Secondary | ICD-10-CM

## 2018-03-26 DIAGNOSIS — G8929 Other chronic pain: Secondary | ICD-10-CM

## 2018-03-26 NOTE — Therapy (Signed)
[place] [facility] [address] [place], [address] Phone: [phone]   Fax:  [phone]  Physical Therapy Treatment  Patient Details  Name: Anna Mccarthy MRN: [id number] Date of Birth: 21-Dec-1955 No data recorded  Encounter Date: 03/26/2018  PT End of Session - 03/26/18 1344    Visit Number  11    Number of Visits  14    Date for PT Re-Evaluation  05/07/18    PT Start Time  1300    PT Stop Time  1345    PT Time Calculation (min)  45 min    Activity Tolerance  Patient tolerated treatment well    Behavior During Therapy  [facility] for tasks assessed/performed       Past Medical History:  Diagnosis Date  . Allergy   . Anxiety   . Arthritis   . Bronchitis   . Cancer ([place])    skin  . GERD (gastroesophageal reflux disease)   . Hypertension     Past Surgical History:  Procedure Laterality Date  . ABDOMINAL HYSTERECTOMY  1996  . BREAST BIOPSY Left 2011   stereo done by dr. Bary Castilla   . COLONOSCOPY    . COLONOSCOPY WITH PROPOFOL N/A 05/30/2016   Procedure: COLONOSCOPY WITH PROPOFOL;  Surgeon: Lollie Sails, MD;  Location[facility] ENDOSCOPY;  Service: Endoscopy;  Laterality: N/A;  . HERNIA REPAIR  2012  . KNEE ARTHROSCOPY WITH MEDIAL MENISECTOMY Left 02/18/2016   Procedure: KNEE ARTHROSCOPY WITH PARTIAL MEDIAL MENISECTOMY;  Surgeon: Hessie Knows, MD;  Location: [facility] ORS;  Service: Orthopedics;  Laterality: Left;  . TUBAL LIGATION  1984    There were no vitals filed for this visit.  Subjective Assessment - 03/26/18 1308    Subjective  Patient reports a worst pain in the past week of a 3/10 and states she is no longer taking NSAIDS. Patient reports the pain has been improving. Patient reports the pool therapy is doing well.     Currently in Pain?  Yes    Pain Score  3     Pain Location  Knee    Pain Orientation  Left    Pain Descriptors / Indicators  Aching    Pain Type  Chronic pain    Pain Onset  More than  a month ago    Pain Frequency  Constant        TREATMENT: Therapeutic Exercise: Hip abduction in standing - 2 x 15 40# at hip machine  Hip sliders in standing into abduction from extension - 2 x 15  Sitting ball Knee AROM - x 2 min B Ambulation 1747ft to LE enduracne with cueing to improve speed Hip extension in standing with UE support - 2x 20 with sliders Single leg stance - 2 x 30sec Tandem stance rotations in standing - x10 Nustep level 5 (seat level 8) - x 47min, level 7 - x 12min;    Patient demonstrates increased fatigue at the end of the session   PT Education - 03/26/18 1345    Education Details  POC; Form/technique with exercises    Person(s) Educated  Patient    Methods  Explanation;Demonstration    Comprehension  Verbalized understanding;Returned demonstration          PT Long Term Goals - 03/26/18 1315      PT LONG TERM GOAL #1   Title  Pt will be independent and compliant with HEP in order to maintain gins made in physical  therapy and return to prior level of function.    Baseline  02/14/18: dependent on form and technique; 03/26/18: Moderate cueing for exercise performance    Time  4    Period  Weeks    Status  On-going      PT LONG TERM GOAL #2   Title  Pt will reports worst pain in past week <6/10 in order to increase tolerance to amb and return to prior level of function.    Baseline  02/14/18: worst pain 8/10; 03/26/2018: 3/10    Time  6    Period  Weeks    Status  Achieved      PT LONG TERM GOAL #3   Title  Pt will improve 6MWT distance from 1378m to 154m with no increase in pain >1 on NPRS in order to demonstrate increased endurance, increased tolerance to long distance amb and to return to prior level of function.    Baseline  02/14/18: 1370 ft, pain increased to 7/10 at 3 minutes; 03/26/2018: 1742ft    Time  6    Period  Weeks    Status  Achieved      PT LONG TERM GOAL #4   Title  Pt will improve FOTO score from 30 to 51 in order to improve  functional mobility and return to prior level of function.    Baseline  02/14/18: 30     Time  6    Period  Weeks    Status  New      PT LONG TERM GOAL #5   Title  Pt will ascend/descend steps with reciprocal pattern and no increase in pain in order to increase independence and safety while ambulating in the community.     Baseline  02/14/18: step to pattern, one handrail, incr pain; 9/30/19step through pattern - no UE support    Time  6    Period  Weeks    Status  Achieved      Additional Long Term Goals   Additional Long Term Goals  Yes      PT LONG TERM GOAL #6   Title  Patient will be able to stand on one leg for >10sec to improve propioception of the affected LE and to decrease fall risk    Baseline  8 sec B    Time  6    Period  Weeks    Status  New            Plan - 03/26/18 1340    Clinical Impression Statement  Patient is making progress towards long term goals with improvement in pain, FOTO, stairs ascending/desending, and 62minWT. Although patient is improving, she continues to demonstrate increased pain with squatting motions and standing/walking for prolonged periods of time. Patient also demosntrates decreased single leg balance indicating decreased propioception. Patient will benefit from further skilled therapy to return to prior level of function.     Rehab Potential  Fair    Clinical Impairments Affecting Rehab Potential  (+)motivated, family support, active lifestyle (-)multiple chronic conditions     PT Frequency  2x / week    PT Duration  6 weeks    PT Treatment/Interventions  ADLs/Self Care Home Management;Aquatic Therapy;Biofeedback;Cryotherapy;Electrical Stimulation;Gait training;Ultrasound;Moist Heat;Stair training;Functional mobility training;Therapeutic activities;Therapeutic exercise;Balance training;Neuromuscular re-education;Manual techniques;Patient/family education;Passive range of motion;Dry needling    PT Next Visit Plan  balance asssessment, joint  mobs, squat, TKE, lateral step ups    PT Home Exercise Plan  See education section  Consulted and Agree with Plan of Care  Patient       Patient will benefit from skilled therapeutic intervention in order to improve the following deficits and impairments:  Abnormal gait, Decreased range of motion, Difficulty walking, Decreased activity tolerance, Pain, Decreased balance, Decreased strength  Visit Diagnosis: History of falling  Chronic pain of left knee  Stiffness of left knee, not elsewhere classified     Problem List Patient Active Problem List   Diagnosis Date Noted  . Venous ulcer ([place]) 02/02/2017  . Varicose veins of bilateral lower extremities with pain 05/16/2016  . Chronic venous insufficiency 05/16/2016  . Pain in limb 05/16/2016  . Swelling of limb 05/16/2016  . DJD (degenerative joint disease) 05/16/2016    Blythe Stanford, PT DPT 03/26/2018, 1:54 PM  [place] [facility] [address], [place], [address] Phone: [phone]   Fax:  [phone]  Name: ALLANA SHRESTHA MRN: [id number] Date of Birth: May 10, 1956

## 2018-03-26 NOTE — Addendum Note (Signed)
Addended by: Blain Pais on: 03/26/2018 02:01 PM   Modules accepted: Orders

## 2018-03-27 ENCOUNTER — Ambulatory Visit: Payer: 59 | Admitting: Student in an Organized Health Care Education/Training Program

## 2018-03-29 ENCOUNTER — Ambulatory Visit: Payer: 59

## 2018-04-03 ENCOUNTER — Ambulatory Visit: Payer: 59 | Admitting: Student in an Organized Health Care Education/Training Program

## 2018-04-05 ENCOUNTER — Encounter: Payer: Self-pay | Admitting: Student in an Organized Health Care Education/Training Program

## 2018-04-05 ENCOUNTER — Ambulatory Visit
Payer: 59 | Attending: Student in an Organized Health Care Education/Training Program | Admitting: Student in an Organized Health Care Education/Training Program

## 2018-04-05 ENCOUNTER — Other Ambulatory Visit: Payer: Self-pay

## 2018-04-05 ENCOUNTER — Ambulatory Visit: Payer: 59

## 2018-04-05 VITALS — BP 126/90 | HR 76 | Temp 98.4°F | Resp 14 | Ht 62.0 in | Wt 170.0 lb

## 2018-04-05 DIAGNOSIS — G8929 Other chronic pain: Secondary | ICD-10-CM | POA: Insufficient documentation

## 2018-04-05 DIAGNOSIS — M797 Fibromyalgia: Secondary | ICD-10-CM | POA: Diagnosis not present

## 2018-04-05 DIAGNOSIS — I868 Varicose veins of other specified sites: Secondary | ICD-10-CM | POA: Insufficient documentation

## 2018-04-05 DIAGNOSIS — M1712 Unilateral primary osteoarthritis, left knee: Secondary | ICD-10-CM | POA: Insufficient documentation

## 2018-04-05 DIAGNOSIS — I10 Essential (primary) hypertension: Secondary | ICD-10-CM | POA: Diagnosis not present

## 2018-04-05 DIAGNOSIS — I872 Venous insufficiency (chronic) (peripheral): Secondary | ICD-10-CM | POA: Insufficient documentation

## 2018-04-05 DIAGNOSIS — F419 Anxiety disorder, unspecified: Secondary | ICD-10-CM | POA: Insufficient documentation

## 2018-04-05 DIAGNOSIS — K219 Gastro-esophageal reflux disease without esophagitis: Secondary | ICD-10-CM | POA: Diagnosis not present

## 2018-04-05 DIAGNOSIS — M25562 Pain in left knee: Secondary | ICD-10-CM | POA: Diagnosis not present

## 2018-04-05 DIAGNOSIS — M23307 Other meniscus derangements, unspecified meniscus, left knee: Secondary | ICD-10-CM

## 2018-04-05 DIAGNOSIS — Z79899 Other long term (current) drug therapy: Secondary | ICD-10-CM | POA: Insufficient documentation

## 2018-04-05 NOTE — Progress Notes (Signed)
Patient's Name: FARON WHITELOCK  MRN: [id number]  Referring Provider: Maryland Pink, MD  DOB: June 26, 1956  PCP: Maryland Pink, MD  DOS: 04/05/2018  Note by: Gillis Santa, MD  Service setting: Ambulatory outpatient  Specialty: Interventional Pain Management  Location: [facility] (AMB) Pain Management Facility    Patient type: Established   Primary Reason(s) for Visit: Encounter for post-procedure evaluation of chronic illness with mild to moderate exacerbation CC: Knee Pain (left)  HPI  Ms. Navedo is a 62 y.o. year old, female patient, who comes today for a post-procedure evaluation. She has Varicose veins of bilateral lower extremities with pain; Chronic venous insufficiency; Pain in limb; Swelling of limb; DJD (degenerative joint disease); and Venous ulcer (HCC) on their problem list. Her primarily concern today is the Knee Pain (left)  Pain Assessment: Location: Left Knee Radiating: denies Onset: More than a month ago Duration: Chronic pain Quality: Stabbing Severity: 1 /10 (subjective, self-reported pain score)  Note: Reported level is compatible with observation.                         When using our objective Pain Scale, levels between 6 and 10/10 are said to belong in an emergency room, as it progressively worsens from a 6/10, described as severely limiting, requiring emergency care not usually available at an outpatient pain management facility. At a 6/10 level, communication becomes difficult and requires great effort. Assistance to reach the emergency department may be required. Facial flushing and profuse sweating along with potentially dangerous increases in heart rate and blood pressure will be evident. Effect on ADL:   Timing: Intermittent Modifying factors: ice, elevation, rest BP: 126/90  HR: 76  Ms. Dern comes in today for post-procedure evaluation.  Further details on both, my assessment(s), as well as the proposed treatment plan, please see below.  Post-Procedure Assessment   03/06/2018 Procedure:left knee GNB #2 Pre-procedure pain score:        /10 Post-procedure pain score: 0/10         Influential Factors: BMI: 31.09 kg/m Intra-procedural challenges: None observed.         Assessment challenges: None detected.              Reported side-effects: None.        Post-procedural adverse reactions or complications: None reported         Sedation: Please see nurses note. When no sedatives are used, the analgesic levels obtained are directly associated to the effectiveness of the local anesthetics. However, when sedation is provided, the level of analgesia obtained during the initial 1 hour following the intervention, is believed to be the result of a combination of factors. These factors may include, but are not limited to: 1. The effectiveness of the local anesthetics used. 2. The effects of the analgesic(s) and/or anxiolytic(s) used. 3. The degree of discomfort experienced by the patient at the time of the procedure. 4. The patients ability and reliability in recalling and recording the events. 5. The presence and influence of possible secondary gains and/or psychosocial factors. Reported result: Relief experienced during the 1st hour after the procedure: 100 % (Ultra-Short Term Relief)            Interpretative annotation: Clinically appropriate result. Analgesia during this period is likely to be Local Anesthetic and/or IV Sedative (Analgesic/Anxiolytic) related.          Effects of local anesthetic: The analgesic effects attained during this period are directly associated to the  localized infiltration of local anesthetics and therefore cary significant diagnostic value as to the etiological location, or anatomical origin, of the pain. Expected duration of relief is directly dependent on the pharmacodynamics of the local anesthetic used. Long-acting (4-6 hours) anesthetics used.  Reported result: Relief during the next 4 to 6 hour after the procedure: 100 %  (Short-Term Relief)            Interpretative annotation: Clinically appropriate result. Analgesia during this period is likely to be Local Anesthetic-related.          Long-term benefit: Defined as the period of time past the expected duration of local anesthetics (1 hour for short-acting and 4-6 hours for long-acting). With the possible exception of prolonged sympathetic blockade from the local anesthetics, benefits during this period are typically attributed to, or associated with, other factors such as analgesic sensory neuropraxia, antiinflammatory effects, or beneficial biochemical changes provided by agents other than the local anesthetics.  Reported result: Extended relief following procedure: 100 %(lasted 2 weeks) (Long-Term Relief)            Interpretative annotation: Clinically possible results. Good relief. No permanent benefit expected. Inflammation plays a part in the etiology to the pain.          Current benefits: Defined as reported results that persistent at this point in time.   Analgesia: 50-75 %            Function: Ms. Michl reports improvement in function ROM: Ms. Marzano reports improvement in ROM Interpretative annotation: Ongoing benefit. Therapeutic benefit observed. Effective diagnostic intervention.          Interpretation: Results would suggest a successful therapeutic intervention.                  Plan:  Proceed with Radiofrequency Ablation for the purpose of attaining long-term benefits.  Patient is status post 2 diagnostic left knee genicular nerve blocks which provided her with close to 100% pain relief for approximately 2 weeks with gradual return of her knee pain thereafter.  Patient is still endorsing ongoing pain relief in regards to her left knee pain and is pleased with therapy.  Patient has been participating in physical therapy as well as aquatic therapy which is been beneficial for her left knee pain symptoms.  Given that the patient is having ongoing benefit  and improvement in functional status, we will set up conventional left knee RFA PRN.                Laboratory Chemistry  Inflammation Markers (CRP: Acute Phase) (ESR: Chronic Phase) No results found for: CRP, ESRSEDRATE, LATICACIDVEN                       Rheumatology Markers No results found for: RF, ANA, LABURIC, URICUR, LYMEIGGIGMAB, LYMEABIGMQN, HLAB27                      Renal Function Markers Lab Results  Component Value Date   BUN 18 10/07/2014   CREATININE 0.86 10/07/2014   GFRAA >60 10/07/2014   GFRNONAA >60 10/07/2014                             Hepatic Function Markers Lab Results  Component Value Date   AST 24 10/07/2014   ALT 20 10/07/2014   ALBUMIN 4.4 10/07/2014   ALKPHOS 78 10/07/2014  Electrolytes Lab Results  Component Value Date   NA 135 10/07/2014   K 3.7 10/07/2014   CL 99 (L) 10/07/2014   CALCIUM 9.0 10/07/2014                        Neuropathy Markers No results found for: VITAMINB12, FOLATE, HGBA1C, HIV                      CNS Tests No results found for: COLORCSF, APPEARCSF, RBCCOUNTCSF, WBCCSF, POLYSCSF, LYMPHSCSF, EOSCSF, PROTEINCSF, GLUCCSF, JCVIRUS, CSFOLI, IGGCSF                      Bone Pathology Markers No results found for: VD25OH, [id number], WI0973ZH2, DJ2426ST4, 25OHVITD1, 25OHVITD2, 25OHVITD3, TESTOFREE, TESTOSTERONE                       Coagulation Parameters Lab Results  Component Value Date   PLT 387 10/07/2014                        Cardiovascular Markers Lab Results  Component Value Date   TROPONINI <0.03 10/07/2014   HGB 13.5 10/07/2014   HCT 39.6 10/07/2014                         CA Markers No results found for: CEA, CA125, LABCA2                      Note: Lab results reviewed.  Recent Diagnostic Imaging Results  DG C-Arm 1-60 Min-No Report Fluoroscopy was utilized by the requesting physician.  No radiographic  interpretation.   Complexity Note: Imaging results reviewed.  Results shared with Ms. Kretz, using Layman's terms.                         Meds   Current Outpatient Medications:  .  ALPRAZolam (XANAX) 0.25 MG tablet, TK 1 T PO BID PRN, Disp: , Rfl: 0 .  DULoxetine (CYMBALTA) 20 MG capsule, 20 mg 2 (two) times daily. , Disp: , Rfl:  .  escitalopram (LEXAPRO) 20 MG tablet, Take 20 mg by mouth at bedtime. , Disp: , Rfl:  .  hydrochlorothiazide (HYDRODIURIL) 25 MG tablet, Take 25 mg by mouth daily., Disp: , Rfl:  .  omeprazole (PRILOSEC) 40 MG capsule, Take 40 mg by mouth daily., Disp: , Rfl:  .  tiZANidine (ZANAFLEX) 2 MG tablet, Take by mouth at bedtime., Disp: , Rfl:  .  ALPRAZolam (XANAX) 0.5 MG tablet, Take 0.5 mg by mouth at bedtime. , Disp: , Rfl: 2 .  b complex vitamins tablet, Take 1 tablet by mouth daily., Disp: , Rfl:  .  diclofenac (VOLTAREN) 75 MG EC tablet, Take 1 tablet (75 mg total) by mouth 2 (two) times daily after a meal. (Patient not taking: Reported on 04/05/2018), Disp: 60 tablet, Rfl: 1 .  Omega-3 Fatty Acids (FISH OIL PO), Take by mouth., Disp: , Rfl:  .  traMADol (ULTRAM) 50 MG tablet, Take by mouth., Disp: , Rfl:  .  Turmeric 1053 MG TABS, Take by mouth., Disp: , Rfl:  .  vitamin E 100 UNIT capsule, Take 100 Units by mouth at bedtime. , Disp: , Rfl:   ROS  Constitutional: Denies any fever or chills Gastrointestinal: No reported hemesis, hematochezia, vomiting, or acute GI distress Musculoskeletal:  Denies any acute onset joint swelling, redness, loss of ROM, or weakness Neurological: No reported episodes of acute onset apraxia, aphasia, dysarthria, agnosia, amnesia, paralysis, loss of coordination, or loss of consciousness  Allergies  Ms. Clere is allergic to percocet [oxycodone-acetaminophen] and sulfa antibiotics.  PFSH  Drug: Ms. Cope  reports that she does not use drugs. Alcohol:  reports that she drinks about 2.0 standard drinks of alcohol per week. Tobacco:  reports that she has never smoked. She has never used  smokeless tobacco. Medical:  has a past medical history of Allergy, Anxiety, Arthritis, Bronchitis, Cancer ([place]), GERD (gastroesophageal reflux disease), and Hypertension. Surgical: Ms. Axelson  has a past surgical history that includes Tubal ligation (1984); Abdominal hysterectomy (1996); Hernia repair (2012); Colonoscopy; Knee arthroscopy with medial menisectomy (Left, 02/18/2016); Colonoscopy with propofol (N/A, 05/30/2016); and Breast biopsy (Left, 2011). Family: family history includes Breast cancer in her cousin and maternal aunt; Ovarian cancer in her sister.  Constitutional Exam  General appearance: Well nourished, well developed, and well hydrated. In no apparent acute distress Vitals:   04/05/18 0857 04/05/18 0859  BP: (!) 150/95 126/90  Pulse: 76   Resp: 14   Temp: 98.4 F (36.9 C)   TempSrc: Oral   SpO2: 98%   Weight: 170 lb (77.1 kg)   Height: '5\' 2"'$  (1.575 m)    BMI Assessment: Estimated body mass index is 31.09 kg/m as calculated from the following:   Height as of this encounter: '5\' 2"'$  (1.575 m).   Weight as of this encounter: 170 lb (77.1 kg).  BMI interpretation table: BMI level Category Range association with higher incidence of chronic pain  <18 kg/m2 Underweight   18.5-24.9 kg/m2 Ideal body weight   25-29.9 kg/m2 Overweight Increased incidence by 20%  30-34.9 kg/m2 Obese (Class I) Increased incidence by 68%  35-39.9 kg/m2 Severe obesity (Class II) Increased incidence by 136%  >40 kg/m2 Extreme obesity (Class III) Increased incidence by 254%   Patient's current BMI Ideal Body weight  Body mass index is 31.09 kg/m. Ideal body weight: 50.1 kg (110 lb 7.2 oz) Adjusted ideal body weight: 60.9 kg (134 lb 4.3 oz)   BMI Readings from Last 4 Encounters:  04/05/18 31.09 kg/m  03/05/18 31.09 kg/m  02/13/18 31.09 kg/m  01/31/18 31.09 kg/m   Wt Readings from Last 4 Encounters:  04/05/18 170 lb (77.1 kg)  03/05/18 170 lb (77.1 kg)  02/13/18 170 lb (77.1 kg)   01/31/18 170 lb (77.1 kg)  Psych/Mental status: Alert, oriented x 3 (person, place, & time)       Eyes: PERLA Respiratory: No evidence of acute respiratory distress  Cervical Spine Area Exam  Skin & Axial Inspection: No masses, redness, edema, swelling, or associated skin lesions Alignment: Symmetrical Functional ROM: Unrestricted ROM      Stability: No instability detected Muscle Tone/Strength: Functionally intact. No obvious neuro-muscular anomalies detected. Sensory (Neurological): Unimpaired Palpation: No palpable anomalies               Thoracic Spine Area Exam  Skin & Axial Inspection: No masses, redness, or swelling Alignment: Symmetrical Functional ROM: Unrestricted ROM Stability: No instability detected Muscle Tone/Strength: Functionally intact. No obvious neuro-muscular anomalies detected. Sensory (Neurological): Unimpaired Muscle strength & Tone: No palpable anomalies  Lumbar Spine Area Exam  Skin & Axial Inspection: No masses, redness, or swelling Alignment: Symmetrical Functional ROM: Unrestricted ROM       Stability: No instability detected Muscle Tone/Strength: Functionally intact. No obvious neuro-muscular anomalies detected. Sensory (  Neurological): Unimpaired Palpation: No palpable anomalies       Provocative Tests: Hyperextension/rotation test: deferred today       Lumbar quadrant test (Kemp's test): deferred today       Lateral bending test: deferred today       Patrick's Maneuver: deferred today                   FABER test: deferred today                   S-I anterior distraction/compression test: deferred today         S-I lateral compression test: deferred today         S-I Thigh-thrust test: deferred today         S-I Gaenslen's test: deferred today          Gait & Posture Assessment  Ambulation: Unassisted Gait: Relatively normal for age and body habitus Posture: WNL   Lower Extremity Exam    Side: Right lower extremity  Side: Left lower  extremity  Stability: No instability observed          Stability: No instability observed          Skin & Extremity Inspection: Skin color, temperature, and hair growth are WNL. No peripheral edema or cyanosis. No masses, redness, swelling, asymmetry, or associated skin lesions. No contractures.  Skin & Extremity Inspection: Skin color, temperature, and hair growth are WNL. No peripheral edema or cyanosis. No masses, redness, swelling, asymmetry, or associated skin lesions. No contractures.  Functional ROM: Unrestricted ROM                  Functional ROM: Decreased ROM for knee joint          Muscle Tone/Strength: Functionally intact. No obvious neuro-muscular anomalies detected.  Muscle Tone/Strength: Functionally intact. No obvious neuro-muscular anomalies detected.  Sensory (Neurological): Unimpaired  Sensory (Neurological): Arthropathic arthralgia  Palpation: No palpable anomalies  Palpation: No palpable anomalies   Assessment  Primary Diagnosis & Pertinent Problem List: The primary encounter diagnosis was Primary osteoarthritis of left knee. Diagnoses of Chronic pain of left knee, Meniscus degeneration, left, and Fibromyalgia were also pertinent to this visit.  Status Diagnosis  Controlled Improving Controlled 1. Primary osteoarthritis of left knee   2. Chronic pain of left knee   3. Meniscus degeneration, left   4. Fibromyalgia      62 year old female with history of left knee osteoarthritis who follows up status post second left knee genicular nerve block and is endorsing ongoing pain relief greater than 50% of her left knee pain.  She also endorses improvement in left knee range of motion and functional status.  Patient states that she is able to participate in physical therapy, aquatic therapy, ADLs with greater ease.  We discussed radiofrequency ablation of left knee genicular nerves.  Given that the patient is continued to have ongoing pain relief after her left knee genicular  nerve block, we can consider this procedure when patient has return her left knee pain.  Plan: -PRN left knee genicular nerve radiofrequency ablation  Lab-work, procedure(s), and/or referral(s): Orders Placed This Encounter  Procedures  . Radiofrequency,Genicular   Time Note: Greater than 50% of the 25 minute(s) of face-to-face time spent with Ms. Majid, was spent in counseling/coordination of care regarding: Ms. Mcafee primary cause of pain, the treatment plan, treatment alternatives, the risks and possible complications of proposed treatment, the results, interpretation and significance of  her recent  diagnostic interventional treatment(s), realistic expectations and the goals of pain management (increased in functionality).  Provider-requested follow-up: Return if symptoms worsen or fail to improve.  Future Appointments  Date Time Provider [place]  04/09/2018 11:15 AM Blythe Stanford, PT [facility] None    Primary Care Physician: Maryland Pink, MD Location: [facility] Out[facility] Management Facility Note by: Gillis Santa, M.D Date: 04/05/2018; Time: 9:20 AM  There are no Patient Instructions on file for this visit.

## 2018-04-05 NOTE — Progress Notes (Signed)
Safety precautions to be maintained throughout the outpatient stay will include: orient to surroundings, keep bed in low position, maintain call bell within reach at all times, provide assistance with transfer out of bed and ambulation.  

## 2018-04-09 ENCOUNTER — Ambulatory Visit: Payer: 59 | Attending: Student in an Organized Health Care Education/Training Program

## 2018-04-09 DIAGNOSIS — M25662 Stiffness of left knee, not elsewhere classified: Secondary | ICD-10-CM | POA: Insufficient documentation

## 2018-04-09 DIAGNOSIS — G8929 Other chronic pain: Secondary | ICD-10-CM | POA: Insufficient documentation

## 2018-04-09 DIAGNOSIS — Z9181 History of falling: Secondary | ICD-10-CM | POA: Insufficient documentation

## 2018-04-09 DIAGNOSIS — M25562 Pain in left knee: Secondary | ICD-10-CM | POA: Diagnosis present

## 2018-04-09 NOTE — Therapy (Signed)
[facility] [address], [place], [address] Phone: [phone]   Fax:  [phone]  Physical Therapy Evaluation  Patient Details  Name: Anna Mccarthy MRN: [id number] Date of Birth: Dec 16, 1955 No data recorded  Encounter Date: 04/09/2018  PT End of Session - 04/09/18 1146    Visit Number  12    Number of Visits  22    Date for PT Re-Evaluation  05/07/18    PT Start Time  1118    PT Stop Time  1200    PT Time Calculation (min)  42 min    Activity Tolerance  Patient tolerated treatment well    Behavior During Therapy  [facility] for tasks assessed/performed       Past Medical History:  Diagnosis Date  . Allergy   . Anxiety   . Arthritis   . Bronchitis   . Cancer ([place])    skin  . GERD (gastroesophageal reflux disease)   . Hypertension     Past Surgical History:  Procedure Laterality Date  . ABDOMINAL HYSTERECTOMY  1996  . BREAST BIOPSY Left 2011   stereo done by dr. Bary Castilla   . COLONOSCOPY    . COLONOSCOPY WITH PROPOFOL N/A 05/30/2016   Procedure: COLONOSCOPY WITH PROPOFOL;  Surgeon: Lollie Sails, MD;  Location: [facility] ENDOSCOPY;  Service: Endoscopy;  Laterality: N/A;  . HERNIA REPAIR  2012  . KNEE ARTHROSCOPY WITH MEDIAL MENISECTOMY Left 02/18/2016   Procedure: KNEE ARTHROSCOPY WITH PARTIAL MEDIAL MENISECTOMY;  Surgeon: Hessie Knows, MD;  Location: [facility] ORS;  Service: Orthopedics;  Laterality: Left;  . TUBAL LIGATION  1984    There were no vitals filed for this visit.   Subjective Assessment - 04/09/18 1132    Subjective  Patient reports her knee "popped" yesterday which made it feel better. Patient states she did not go to the pool the other day secondary to having eczema.     Currently in Pain?  Yes    Pain Score  --   1.5/10   Pain Location  Knee    Pain Orientation  Left    Pain Descriptors / Indicators  Aching    Pain Onset  More than a month ago    Pain Frequency  Intermittent        Objective measurements completed on examination: See above findings.      TREATMENT: Therapeutic Exercise: Standing squats in standing - 2 x 10 Step ups onto Bosu ball - x20 Side stepping with cross over - x 20 Hip sliders in standing into extension - x25  Hip sliders in standing into abduction - x 25  Single leg stance on bosu ball - x 20 Hip hikes in standing off of 4" step - x 20  Nustep level 5 (seat level 8) - x 98min, level 7 - x 65min;  Single leg RDL - x15 B    Patient demonstrates increased fatigue at the end of the session   PT Education - 04/09/18 1143    Education Details  form/technique with exercises    Person(s) Educated  Patient    Methods  Explanation;Demonstration    Comprehension  Verbalized understanding;Returned demonstration          PT Long Term Goals - 03/26/18 1315      PT LONG TERM GOAL #1   Title  Pt will be independent and compliant with HEP in order to maintain gins made in physical therapy and return to  prior level of function.    Baseline  02/14/18: dependent on form and technique; 03/26/18: Moderate cueing for exercise performance    Time  4    Period  Weeks    Status  On-going      PT LONG TERM GOAL #2   Title  Pt will reports worst pain in past week <6/10 in order to increase tolerance to amb and return to prior level of function.    Baseline  02/14/18: worst pain 8/10; 03/26/2018: 3/10    Time  6    Period  Weeks    Status  Achieved      PT LONG TERM GOAL #3   Title  Pt will improve 6MWT distance from 1358m to 1520m with no increase in pain >1 on NPRS in order to demonstrate increased endurance, increased tolerance to long distance amb and to return to prior level of function.    Baseline  02/14/18: 1370 ft, pain increased to 7/10 at 3 minutes; 03/26/2018: 174ft    Time  6    Period  Weeks    Status  Achieved      PT LONG TERM GOAL #4   Title  Pt will improve FOTO score from 30 to 51 in order to improve functional mobility and  return to prior level of function.    Baseline  02/14/18: 30     Time  6    Period  Weeks    Status  New      PT LONG TERM GOAL #5   Title  Pt will ascend/descend steps with reciprocal pattern and no increase in pain in order to increase independence and safety while ambulating in the community.     Baseline  02/14/18: step to pattern, one handrail, incr pain; 9/30/19step through pattern - no UE support    Time  6    Period  Weeks    Status  Achieved      Additional Long Term Goals   Additional Long Term Goals  Yes      PT LONG TERM GOAL #6   Title  Patient will be able to stand on one leg for >10sec to improve propioception of the affected LE and to decrease fall risk    Baseline  8 sec B    Time  6    Period  Weeks    Status  New             Plan - 04/09/18 1148    Clinical Impression Statement  Patient demonstrates improvement with balance and resistive exercises with ability to perform greater amount of higher level exercises compared to previous sessions indicating funcitonal caryover between sessions. Patient demonstrates decreased single leg stance balance indicating poor propioception. Patient will benefit from further skilled therapy to return to prior level of function.       Rehab Potential  Fair    Clinical Impairments Affecting Rehab Potential  (+)motivated, family support, active lifestyle (-)multiple chronic conditions     PT Frequency  2x / week    PT Duration  6 weeks    PT Treatment/Interventions  ADLs/Self Care Home Management;Aquatic Therapy;Biofeedback;Cryotherapy;Electrical Stimulation;Gait training;Ultrasound;Moist Heat;Stair training;Functional mobility training;Therapeutic activities;Therapeutic exercise;Balance training;Neuromuscular re-education;Manual techniques;Patient/family education;Passive range of motion;Dry needling    PT Next Visit Plan  balance asssessment, joint mobs, squat, TKE, lateral step ups    PT Home Exercise Plan  See education  section    Consulted and Agree with Plan of Care  Patient  Patient will benefit from skilled therapeutic intervention in order to improve the following deficits and impairments:  Abnormal gait, Decreased range of motion, Difficulty walking, Decreased activity tolerance, Pain, Decreased balance, Decreased strength  Visit Diagnosis: History of falling  Chronic pain of left knee  Stiffness of left knee, not elsewhere classified     Problem List Patient Active Problem List   Diagnosis Date Noted  . Venous ulcer ([place]) 02/02/2017  . Varicose veins of bilateral lower extremities with pain 05/16/2016  . Chronic venous insufficiency 05/16/2016  . Pain in limb 05/16/2016  . Swelling of limb 05/16/2016  . DJD (degenerative joint disease) 05/16/2016    Blythe Stanford, PT DPT 04/09/2018, 12:13 PM  [facility] [address] [place], [address] Phone: [phone]   Fax:  [phone]  Name: Anna Mccarthy MRN: [id number] Date of Birth: [birth year]

## 2018-04-17 ENCOUNTER — Ambulatory Visit: Payer: 59

## 2018-04-19 ENCOUNTER — Other Ambulatory Visit: Payer: Self-pay

## 2018-04-19 ENCOUNTER — Ambulatory Visit: Payer: 59

## 2018-04-19 DIAGNOSIS — M25562 Pain in left knee: Principal | ICD-10-CM

## 2018-04-19 DIAGNOSIS — G8929 Other chronic pain: Secondary | ICD-10-CM

## 2018-04-19 DIAGNOSIS — Z9181 History of falling: Secondary | ICD-10-CM | POA: Diagnosis not present

## 2018-04-19 DIAGNOSIS — M25662 Stiffness of left knee, not elsewhere classified: Secondary | ICD-10-CM

## 2018-04-19 NOTE — Therapy (Signed)
[place] [facility] SERVICES [address] [place], [place], [address] Phone: [phone]   Fax:  [phone]  Physical Therapy Treatment  Patient Details  Name: Anna Mccarthy MRN: [id number] Date of Birth: Jan 24, 1956 No data recorded  Encounter Date: 04/19/2018  PT End of Session - 04/19/18 1355    Visit Number  13    Number of Visits  22    Date for PT Re-Evaluation  05/07/18    PT Start Time  1200    PT Stop Time  1300    PT Time Calculation (min)  60 min    Activity Tolerance  Patient tolerated treatment well    Behavior During Therapy  [facility] for tasks assessed/performed       Past Medical History:  Diagnosis Date  . Allergy   . Anxiety   . Arthritis   . Bronchitis   . Cancer ([place])    skin  . GERD (gastroesophageal reflux disease)   . Hypertension     Past Surgical History:  Procedure Laterality Date  . ABDOMINAL HYSTERECTOMY  1996  . BREAST BIOPSY Left 2011   stereo done by dr. Bary Castilla   . COLONOSCOPY    . COLONOSCOPY WITH PROPOFOL N/A 05/30/2016   Procedure: COLONOSCOPY WITH PROPOFOL;  Surgeon: Lollie Sails, MD;  Location: [facility] ENDOSCOPY;  Service: Endoscopy;  Laterality: N/A;  . HERNIA REPAIR  2012  . KNEE ARTHROSCOPY WITH MEDIAL MENISECTOMY Left 02/18/2016   Procedure: KNEE ARTHROSCOPY WITH PARTIAL MEDIAL MENISECTOMY;  Surgeon: Hessie Knows, MD;  Location: [facility] ORS;  Service: Orthopedics;  Laterality: Left;  . TUBAL LIGATION  1984    There were no vitals filed for this visit.  Subjective Assessment - 04/19/18 1353    Subjective  Pt reports being busy with some traveling lately. L knee has been manageable with 1-2/10 pain; is currently pain as well, Pt does states she "notices" a reminder of pain if she moves the "wrong" way on the LLE.     Pertinent History  meniscus repair 3 years ago, fibromyalgia      Enters/exits via ramp  Ambulation, warm up  Fwd 4 L  Side 2 L  Side with mini squat 2 L  LE  strength  Sumo squat, 20x  Side lunges, 1 blue dumbbell, 20x ea B   Suspended work, 1 min ea; total 5 min  Jog  jack  ski  X jack  [employer] 2 L recovery walk  Step  Ipsilateral step up with contralateral hip ext; focus on flat foot eccentric return, B 10x ea   SLS  with ball toss and catch, 2 min ea B  Suspended work as above; 5 min followed by 2 L recovery walk  Resistive power walking use arms  Fwd 4 L   Side 4 L   Active stretching  Ham/gastroc and hip flexor/quad, B 3 x 10 sec ea                         PT Education - 04/19/18 1358    Education Details  sumo squat          PT Long Term Goals - 03/26/18 1315      PT LONG TERM GOAL #1   Title  Pt will be independent and compliant with HEP in order to maintain gins made in physical therapy and return to prior level of function.    Baseline  02/14/18: dependent on  form and technique; 03/26/18: Moderate cueing for exercise performance    Time  4    Period  Weeks    Status  On-going      PT LONG TERM GOAL #2   Title  Pt will reports worst pain in past week <6/10 in order to increase tolerance to amb and return to prior level of function.    Baseline  02/14/18: worst pain 8/10; 03/26/2018: 3/10    Time  6    Period  Weeks    Status  Achieved      PT LONG TERM GOAL #3   Title  Pt will improve 6MWT distance from 1389m to 1550m with no increase in pain >1 on NPRS in order to demonstrate increased endurance, increased tolerance to long distance amb and to return to prior level of function.    Baseline  02/14/18: 1370 ft, pain increased to 7/10 at 3 minutes; 03/26/2018: 1735ft    Time  6    Period  Weeks    Status  Achieved      PT LONG TERM GOAL #4   Title  Pt will improve FOTO score from 30 to 51 in order to improve functional mobility and return to prior level of function.    Baseline  02/14/18: 30     Time  6    Period  Weeks    Status  New      PT LONG TERM GOAL #5   Title  Pt will  ascend/descend steps with reciprocal pattern and no increase in pain in order to increase independence and safety while ambulating in the community.     Baseline  02/14/18: step to pattern, one handrail, incr pain; 9/30/19step through pattern - no UE support    Time  6    Period  Weeks    Status  Achieved      Additional Long Term Goals   Additional Long Term Goals  Yes      PT LONG TERM GOAL #6   Title  Patient will be able to stand on one leg for >10sec to improve propioception of the affected LE and to decrease fall risk    Baseline  8 sec B    Time  6    Period  Weeks    Status  New            Plan - 04/19/18 1356    Clinical Impression Statement  Pt tolerates aquatic session well with 2 rounds of suspended work using increased CV demands, improved LE range and strength and core strength/stability. Pt progresses with addition of power walking fwd and side. Also introduced sumo squats with good understanding. No increased pain throughout session.     Rehab Potential  Fair    Clinical Impairments Affecting Rehab Potential  (+)motivated, family support, active lifestyle (-)multiple chronic conditions     PT Frequency  2x / week    PT Duration  6 weeks    PT Treatment/Interventions  ADLs/Self Care Home Management;Aquatic Therapy;Biofeedback;Cryotherapy;Electrical Stimulation;Gait training;Ultrasound;Moist Heat;Stair training;Functional mobility training;Therapeutic activities;Therapeutic exercise;Balance training;Neuromuscular re-education;Manual techniques;Patient/family education;Passive range of motion;Dry needling    PT Next Visit Plan  balance asssessment, joint mobs, squat, TKE, lateral step ups    PT Home Exercise Plan  See education section    Consulted and Agree with Plan of Care  Patient       Patient will benefit from skilled therapeutic intervention in order to improve the following deficits and impairments:  Abnormal  gait, Decreased range of motion, Difficulty walking,  Decreased activity tolerance, Pain, Decreased balance, Decreased strength  Visit Diagnosis: Chronic pain of left knee  History of falling  Stiffness of left knee, not elsewhere classified     Problem List Patient Active Problem List   Diagnosis Date Noted  . Venous ulcer ([place]) 02/02/2017  . Varicose veins of bilateral lower extremities with pain 05/16/2016  . Chronic venous insufficiency 05/16/2016  . Pain in limb 05/16/2016  . Swelling of limb 05/16/2016  . DJD (degenerative joint disease) 05/16/2016    Larae Grooms 04/19/2018, 1:59 PM  [place] [facility] MAIN [facility] SERVICES [address] [place], [place], [address] Phone: [phone]   Fax:  [phone]  Name: SHIRLEYMAE HAUTH MRN: [id number] Date of Birth: 1955/07/25

## 2018-04-26 ENCOUNTER — Ambulatory Visit: Payer: 59

## 2018-05-01 ENCOUNTER — Ambulatory Visit: Payer: 59 | Attending: Student in an Organized Health Care Education/Training Program

## 2018-05-01 DIAGNOSIS — M25562 Pain in left knee: Secondary | ICD-10-CM | POA: Diagnosis not present

## 2018-05-01 DIAGNOSIS — G8929 Other chronic pain: Secondary | ICD-10-CM | POA: Insufficient documentation

## 2018-05-01 DIAGNOSIS — M25662 Stiffness of left knee, not elsewhere classified: Secondary | ICD-10-CM | POA: Diagnosis present

## 2018-05-01 DIAGNOSIS — Z9181 History of falling: Secondary | ICD-10-CM

## 2018-05-01 NOTE — Therapy (Signed)
[place] [facility] [address], [place], [address] Phone: [phone]   Fax:  [phone]  Physical Therapy Treatment  Patient Details  Name: Anna Mccarthy MRN: [id number] Date of Birth: 1955-07-28 No data recorded  Encounter Date: 05/01/2018  PT End of Session - 05/01/18 1534    Visit Number  14    Number of Visits  22    Date for PT Re-Evaluation  05/07/18    PT Start Time  1430    PT Stop Time  1515    PT Time Calculation (min)  45 min    Activity Tolerance  Patient tolerated treatment well    Behavior During Therapy  [facility] for tasks assessed/performed       Past Medical History:  Diagnosis Date  . Allergy   . Anxiety   . Arthritis   . Bronchitis   . Cancer ([place])    skin  . GERD (gastroesophageal reflux disease)   . Hypertension     Past Surgical History:  Procedure Laterality Date  . ABDOMINAL HYSTERECTOMY  1996  . BREAST BIOPSY Left 2011   stereo done by dr. Bary Castilla   . COLONOSCOPY    . COLONOSCOPY WITH PROPOFOL N/A 05/30/2016   Procedure: COLONOSCOPY WITH PROPOFOL;  Surgeon: Lollie Sails, MD;  Location: [facility] ENDOSCOPY;  Service: Endoscopy;  Laterality: N/A;  . HERNIA REPAIR  2012  . KNEE ARTHROSCOPY WITH MEDIAL MENISECTOMY Left 02/18/2016   Procedure: KNEE ARTHROSCOPY WITH PARTIAL MEDIAL MENISECTOMY;  Surgeon: Hessie Knows, MD;  Location: [facility] ORS;  Service: Orthopedics;  Laterality: Left;  . TUBAL LIGATION  1984    There were no vitals filed for this visit.  Subjective Assessment - 05/01/18 1524    Subjective  Patient states she is having sinus congestion and reports her pain is increasing. Patient reports she is worried about her nerve block. Patient states she is having difficulty with sleeping and has not exercised secondary to the increased congestion.    Pertinent History  meniscus repair 3 years ago, fibromyalgia    Currently in Pain?  Yes    Pain Score  5     Pain Location  Knee     Pain Orientation  Left    Pain Descriptors / Indicators  Aching    Pain Type  Chronic pain    Pain Onset  More than a month ago    Pain Frequency  Intermittent         TREATMENT: Therapeutic Exercise: Standing squats with UE support - 2 x 10 Hip sliders in standing into extension - x25 B Hip sliders in standing into abduction - x 25 B Step ups onto 8" - x 20 B Lunges in standing - x 30  Hip abduction in standing against machine - 2 x 20 B 40#     Patient demonstrates increased fatigue at the end of the session  PT Education - 05/01/18 1533    Education Details  form/technique with exercise; pain science explanation; reading of explain pain    Person(s) Educated  Patient    Methods  Explanation;Demonstration    Comprehension  Verbalized understanding;Returned demonstration          PT Long Term Goals - 03/26/18 1315      PT LONG TERM GOAL #1   Title  Pt will be independent and compliant with HEP in order to maintain gins made in physical therapy and return to prior level of function.  Baseline  02/14/18: dependent on form and technique; 03/26/18: Moderate cueing for exercise performance    Time  4    Period  Weeks    Status  On-going      PT LONG TERM GOAL #2   Title  Pt will reports worst pain in past week <6/10 in order to increase tolerance to amb and return to prior level of function.    Baseline  02/14/18: worst pain 8/10; 03/26/2018: 3/10    Time  6    Period  Weeks    Status  Achieved      PT LONG TERM GOAL #3   Title  Pt will improve 6MWT distance from 1334m to 1531m with no increase in pain >1 on NPRS in order to demonstrate increased endurance, increased tolerance to long distance amb and to return to prior level of function.    Baseline  02/14/18: 1370 ft, pain increased to 7/10 at 3 minutes; 03/26/2018: 174ft    Time  6    Period  Weeks    Status  Achieved      PT LONG TERM GOAL #4   Title  Pt will improve FOTO score from 30 to 51 in order to  improve functional mobility and return to prior level of function.    Baseline  02/14/18: 30     Time  6    Period  Weeks    Status  New      PT LONG TERM GOAL #5   Title  Pt will ascend/descend steps with reciprocal pattern and no increase in pain in order to increase independence and safety while ambulating in the community.     Baseline  02/14/18: step to pattern, one handrail, incr pain; 9/30/19step through pattern - no UE support    Time  6    Period  Weeks    Status  Achieved      Additional Long Term Goals   Additional Long Term Goals  Yes      PT LONG TERM GOAL #6   Title  Patient will be able to stand on one leg for >10sec to improve propioception of the affected LE and to decrease fall risk    Baseline  8 sec B    Time  6    Period  Weeks    Status  New            Plan - 05/01/18 1535    Clinical Impression Statement  Patient demonstrates improvement in pain and spasms after exercise indicating improvement in motor coordination. Patient able to perform exercises without increase in pain. Patient educated on pain science and encouraged patient to perform follow up readings to learn more. Patient will benefit from further skilled therapy to return to prior level of function.     Rehab Potential  Fair    Clinical Impairments Affecting Rehab Potential  (+)motivated, family support, active lifestyle (-)multiple chronic conditions     PT Frequency  2x / week    PT Duration  6 weeks    PT Treatment/Interventions  ADLs/Self Care Home Management;Aquatic Therapy;Biofeedback;Cryotherapy;Electrical Stimulation;Gait training;Ultrasound;Moist Heat;Stair training;Functional mobility training;Therapeutic activities;Therapeutic exercise;Balance training;Neuromuscular re-education;Manual techniques;Patient/family education;Passive range of motion;Dry needling    PT Next Visit Plan  balance asssessment, joint mobs, squat, TKE, lateral step ups    PT Home Exercise Plan  See education  section    Consulted and Agree with Plan of Care  Patient       Patient will benefit from  skilled therapeutic intervention in order to improve the following deficits and impairments:  Abnormal gait, Decreased range of motion, Difficulty walking, Decreased activity tolerance, Pain, Decreased balance, Decreased strength  Visit Diagnosis: Chronic pain of left knee  History of falling  Stiffness of left knee, not elsewhere classified     Problem List Patient Active Problem List   Diagnosis Date Noted  . Venous ulcer ([place]) 02/02/2017  . Varicose veins of bilateral lower extremities with pain 05/16/2016  . Chronic venous insufficiency 05/16/2016  . Pain in limb 05/16/2016  . Swelling of limb 05/16/2016  . DJD (degenerative joint disease) 05/16/2016    Blythe Stanford, PT DPT 05/01/2018, 3:43 PM  [place] [facility] [address], [place], [address] Phone: [phone]   Fax:  [phone]  Name: Anna Mccarthy MRN: [id number] Date of Birth: 02-Nov-1955

## 2018-05-03 ENCOUNTER — Ambulatory Visit: Payer: 59

## 2018-05-07 ENCOUNTER — Ambulatory Visit: Payer: 59

## 2018-05-07 DIAGNOSIS — M25562 Pain in left knee: Principal | ICD-10-CM

## 2018-05-07 DIAGNOSIS — G8929 Other chronic pain: Secondary | ICD-10-CM

## 2018-05-07 DIAGNOSIS — Z9181 History of falling: Secondary | ICD-10-CM

## 2018-05-07 DIAGNOSIS — M25662 Stiffness of left knee, not elsewhere classified: Secondary | ICD-10-CM

## 2018-05-07 NOTE — Therapy (Signed)
[place] [facility] [address] [place], [address] Phone: [phone]   Fax:  [phone]  Physical Therapy Treatment  Patient Details  Name: Anna Mccarthy MRN: [id number] Date of Birth: [birth year] No data recorded  Encounter Date: 05/07/2018  PT End of Session - 05/07/18 1243    Visit Number  15    Number of Visits  22    Date for PT Re-Evaluation  05/07/18    PT Start Time  1200    PT Stop Time  1245    PT Time Calculation (min)  45 min    Activity Tolerance  Patient tolerated treatment well    Behavior During Therapy  [facility] for tasks assessed/performed       Past Medical History:  Diagnosis Date  . Allergy   . Anxiety   . Arthritis   . Bronchitis   . Cancer ([place])    skin  . GERD (gastroesophageal reflux disease)   . Hypertension     Past Surgical History:  Procedure Laterality Date  . ABDOMINAL HYSTERECTOMY  1996  . BREAST BIOPSY Left 2011   stereo done by dr. Bary Castilla   . COLONOSCOPY    . COLONOSCOPY WITH PROPOFOL N/A 05/30/2016   Procedure: COLONOSCOPY WITH PROPOFOL;  Surgeon: Lollie Sails, MD;  Location: [facility] ENDOSCOPY;  Service: Endoscopy;  Laterality: N/A;  . HERNIA REPAIR  2012  . KNEE ARTHROSCOPY WITH MEDIAL MENISECTOMY Left 02/18/2016   Procedure: KNEE ARTHROSCOPY WITH PARTIAL MEDIAL MENISECTOMY;  Surgeon: Hessie Knows, MD;  Location: [facility] ORS;  Service: Orthopedics;  Laterality: Left;  . TUBAL LIGATION  1984    There were no vitals filed for this visit.  Subjective Assessment - 05/07/18 1223    Subjective  Patient reports improvement in walking over the weekend when going to [place]. Patient states she has been performing her pain science education and exercise    Pertinent History  meniscus repair 3 years ago, fibromyalgia    Patient Stated Goals  decr pain, be able to walk without pain, increase leg strength    Currently in Pain?  Yes    Pain Score  3     Pain Location  Knee    Pain Orientation  Left    Pain Descriptors / Indicators  Aching    Pain Type  Chronic pain    Pain Onset  More than a month ago    Pain Frequency  Intermittent       TREATMENT: Therapeutic Exercise: Standing squats with UE support - 2 x 10 Hip sliders in standing into extension - x25 B Step ups onto 8" - x 20 B Hip extension at hip machine  -- x25 70# Hip abduction at hip machine - x 25 40#  Hip hikes in standing off of 4" step - x 20 B    Patient demonstrates increased fatigue at the end of the session  PT Education - 05/07/18 1241    Education Details  form/technique with exercise; pain science explanation    Person(s) Educated  Patient    Methods  Explanation;Demonstration    Comprehension  Verbalized understanding;Returned demonstration          PT Long Term Goals - 05/07/18 1300      PT LONG TERM GOAL #1   Title  Pt will be independent and compliant with HEP in order to maintain gins made in physical therapy and return to prior level of function.  Baseline  02/14/18: dependent on form and technique; 03/26/18: Moderate cueing for exercise performance    Time  4    Period  Weeks    Status  On-going      PT LONG TERM GOAL #2   Title  Pt will reports worst pain in past week <6/10 in order to increase tolerance to amb and return to prior level of function.    Baseline  02/14/18: worst pain 8/10; 03/26/2018: 3/10    Time  6    Period  Weeks    Status  Achieved      PT LONG TERM GOAL #3   Title  Pt will improve 6MWT distance from 1355m to 1570m with no increase in pain >1 on NPRS in order to demonstrate increased endurance, increased tolerance to long distance amb and to return to prior level of function.    Baseline  02/14/18: 1370 ft, pain increased to 7/10 at 3 minutes; 03/26/2018: 1762ft    Time  6    Period  Weeks    Status  Achieved      PT LONG TERM GOAL #4   Title  Pt will improve FOTO score from 30 to 51 in order to improve functional mobility and return to  prior level of function.    Baseline  02/14/18: 30 ; 05/07/2018: 60    Time  6    Period  Weeks    Status  Achieved      PT LONG TERM GOAL #5   Title  Pt will ascend/descend steps with reciprocal pattern and no increase in pain in order to increase independence and safety while ambulating in the community.     Baseline  02/14/18: step to pattern, one handrail, incr pain; 9/30/19step through pattern - no UE support    Time  6    Period  Weeks    Status  Achieved      Additional Long Term Goals   Additional Long Term Goals  Yes      PT LONG TERM GOAL #6   Title  Patient will be able to stand on one leg for >10sec to improve propioception of the affected LE and to decrease fall risk    Baseline  8 sec B; 05/07/2018: 13 sec     Time  6    Period  Weeks    Status  Achieved      PT LONG TERM GOAL #7   Title  Patient will be demonstrate good understanding of pain science to allow for continous decrease in pain after discharge of therapy.     Baseline  Lack of pain science knowledge    Time  4    Period  Weeks    Status  New    Target Date  06/04/18            Plan - 05/07/18 1243    Clinical Impression Statement  Patient is making progress towards long term goals with improvement in FOTO and single leg stance scores. Although patient is improving, she demosntrates poor carryover with pain science knowledge indicating increased prone to increase in pain. Patient also demonstrates pain catastrophication. Patient will benefit from further skilled therapy to return to prior level of function.     Rehab Potential  Fair    Clinical Impairments Affecting Rehab Potential  (+)motivated, family support, active lifestyle (-)multiple chronic conditions     PT Frequency  3x / week    PT Duration  4 weeks  PT Treatment/Interventions  ADLs/Self Care Home Management;Aquatic Therapy;Biofeedback;Cryotherapy;Electrical Stimulation;Gait training;Ultrasound;Moist Heat;Stair training;Functional  mobility training;Therapeutic activities;Therapeutic exercise;Balance training;Neuromuscular re-education;Manual techniques;Patient/family education;Passive range of motion;Dry needling    PT Next Visit Plan  balance asssessment, joint mobs, squat, TKE, lateral step ups    PT Home Exercise Plan  See education section    Consulted and Agree with Plan of Care  Patient       Patient will benefit from skilled therapeutic intervention in order to improve the following deficits and impairments:  Abnormal gait, Decreased range of motion, Difficulty walking, Decreased activity tolerance, Pain, Decreased balance, Decreased strength  Visit Diagnosis: Chronic pain of left knee  History of falling  Stiffness of left knee, not elsewhere classified     Problem List Patient Active Problem List   Diagnosis Date Noted  . Venous ulcer ([place]) 02/02/2017  . Varicose veins of bilateral lower extremities with pain 05/16/2016  . Chronic venous insufficiency 05/16/2016  . Pain in limb 05/16/2016  . Swelling of limb 05/16/2016  . DJD (degenerative joint disease) 05/16/2016    Blythe Stanford, PT DPT 05/07/2018, 1:07 PM  [place] [facility] [address] [place], [address] Phone: [phone]   Fax:  [phone]  Name: AMITA ATAYDE MRN: [id number] Date of Birth: January 14, 1956

## 2018-05-08 ENCOUNTER — Ambulatory Visit: Payer: 59 | Admitting: Physical Therapy

## 2018-05-08 ENCOUNTER — Encounter: Payer: Self-pay | Admitting: Physical Therapy

## 2018-05-08 ENCOUNTER — Ambulatory Visit: Payer: 59

## 2018-05-08 DIAGNOSIS — M25562 Pain in left knee: Secondary | ICD-10-CM | POA: Diagnosis not present

## 2018-05-08 NOTE — Therapy (Signed)
[place] MAIN [facility] SERVICES [address] [place], [place], [address] Phone: [phone]   Fax:  [phone]  Physical Therapy Treatment  Patient Details  Name: Anna Mccarthy MRN: [id number] Date of Birth: 07-May-1956 No data recorded  Encounter Date: 05/08/2018  PT End of Session - 05/08/18 1328    Visit Number  16    Number of Visits  22    Date for PT Re-Evaluation  06/04/18    PT Start Time  1115    PT Stop Time  1200    PT Time Calculation (min)  45 min    Activity Tolerance  Patient tolerated treatment well    Behavior During Therapy  [facility] for tasks assessed/performed       Past Medical History:  Diagnosis Date  . Allergy   . Anxiety   . Arthritis   . Bronchitis   . Cancer ([place])    skin  . GERD (gastroesophageal reflux disease)   . Hypertension     Past Surgical History:  Procedure Laterality Date  . ABDOMINAL HYSTERECTOMY  1996  . BREAST BIOPSY Left 2011   stereo done by dr. Bary Castilla   . COLONOSCOPY    . COLONOSCOPY WITH PROPOFOL N/A 05/30/2016   Procedure: COLONOSCOPY WITH PROPOFOL;  Surgeon: Lollie Sails, MD;  Location: [facility] ENDOSCOPY;  Service: Endoscopy;  Laterality: N/A;  . HERNIA REPAIR  2012  . KNEE ARTHROSCOPY WITH MEDIAL MENISECTOMY Left 02/18/2016   Procedure: KNEE ARTHROSCOPY WITH PARTIAL MEDIAL MENISECTOMY;  Surgeon: Hessie Knows, MD;  Location: [facility] ORS;  Service: Orthopedics;  Laterality: Left;  . TUBAL LIGATION  1984    There were no vitals filed for this visit.  Subjective Assessment - 05/08/18 1326    Subjective  Pt reports she is feeling well "OK".  Denies pain today    Pertinent History  meniscus repair 3 years ago, fibromyalgia    Currently in Pain?  No/denies         PT Aquatic Therapy  Enters/exits via ramp  Ambulation warm up  Forward 4 laps Sideways 2 laps Sideways with mini squat 2 laps  LE strength  Sumo squat 20x Side lunge with 1 blue dumb bell 20x B  Suspended work  1 minute each 5 minutes total Jog Jack Ski X jack Mogul  2 L recover walk  Step Ispilateral step up with contralateral hip ext, focus on flat foot eccentric return B 10X  SLS With ball toss and catch 2 x each leg 45-60 minutes each side before needing rest  Resistive power walking with arms Forward 4 L Side 4 L  Active stretching  Hamstring/gastroc and hip flexorquad 3 x 10 sec each         PT Education - 05/08/18 1327    Education Details  exercise techniques    Person(s) Educated  Patient    Methods  Explanation;Demonstration    Comprehension  Verbalized understanding;Returned demonstration          PT Long Term Goals - 05/07/18 1300      PT LONG TERM GOAL #1   Title  Pt will be independent and compliant with HEP in order to maintain gins made in physical therapy and return to prior level of function.    Baseline  02/14/18: dependent on form and technique; 03/26/18: Moderate cueing for exercise performance    Time  4    Period  Weeks    Status  On-going  PT LONG TERM GOAL #2   Title  Pt will reports worst pain in past week <6/10 in order to increase tolerance to amb and return to prior level of function.    Baseline  02/14/18: worst pain 8/10; 03/26/2018: 3/10    Time  6    Period  Weeks    Status  Achieved      PT LONG TERM GOAL #3   Title  Pt will improve 6MWT distance from 1370m to 1596m with no increase in pain >1 on NPRS in order to demonstrate increased endurance, increased tolerance to long distance amb and to return to prior level of function.    Baseline  02/14/18: 1370 ft, pain increased to 7/10 at 3 minutes; 03/26/2018: 1758ft    Time  6    Period  Weeks    Status  Achieved      PT LONG TERM GOAL #4   Title  Pt will improve FOTO score from 30 to 51 in order to improve functional mobility and return to prior level of function.    Baseline  02/14/18: 30 ; 05/07/2018: 60    Time  6    Period  Weeks    Status  Achieved      PT LONG TERM GOAL  #5   Title  Pt will ascend/descend steps with reciprocal pattern and no increase in pain in order to increase independence and safety while ambulating in the community.     Baseline  02/14/18: step to pattern, one handrail, incr pain; 9/30/19step through pattern - no UE support    Time  6    Period  Weeks    Status  Achieved      Additional Long Term Goals   Additional Long Term Goals  Yes      PT LONG TERM GOAL #6   Title  Patient will be able to stand on one leg for >10sec to improve propioception of the affected LE and to decrease fall risk    Baseline  8 sec B; 05/07/2018: 13 sec     Time  6    Period  Weeks    Status  Achieved      PT LONG TERM GOAL #7   Title  Patient will be demonstrate good understanding of pain science to allow for continous decrease in pain after discharge of therapy.     Baseline  Lack of pain science knowledge    Time  4    Period  Weeks    Status  New    Target Date  06/04/18            Plan - 05/08/18 1329    Clinical Impression Statement  Pt tolerated activity well.  Stated she is reading a book given to her by primary PT regarding pain and finding it helpful.  No increased pain with aquatic therapy today.    Clinical Presentation  Stable    Clinical Decision Making  Low    Rehab Potential  Fair    Clinical Impairments Affecting Rehab Potential  (+)motivated, family support, active lifestyle (-)multiple chronic conditions     PT Frequency  3x / week    PT Duration  4 weeks    PT Treatment/Interventions  ADLs/Self Care Home Management;Aquatic Therapy;Biofeedback;Cryotherapy;Electrical Stimulation;Gait training;Ultrasound;Moist Heat;Stair training;Functional mobility training;Therapeutic activities;Therapeutic exercise;Balance training;Neuromuscular re-education;Manual techniques;Patient/family education;Passive range of motion;Dry needling    PT Next Visit Plan  balance asssessment, joint mobs, squat, TKE, lateral step ups  Consulted and Agree  with Plan of Care  Patient       Patient will benefit from skilled therapeutic intervention in order to improve the following deficits and impairments:  Abnormal gait, Decreased range of motion, Difficulty walking, Decreased activity tolerance, Pain, Decreased balance, Decreased strength  Visit Diagnosis: Chronic pain of left knee  History of falling  Stiffness of left knee, not elsewhere classified     Problem List Patient Active Problem List   Diagnosis Date Noted  . Venous ulcer ([place]) 02/02/2017  . Varicose veins of bilateral lower extremities with pain 05/16/2016  . Chronic venous insufficiency 05/16/2016  . Pain in limb 05/16/2016  . Swelling of limb 05/16/2016  . DJD (degenerative joint disease) 05/16/2016   Chesley Noon, PTA 05/08/18, 1:37 PM   [place] MAIN [facility] SERVICES [address] [place], [place], [address] Phone: [phone]   Fax:  [phone]  Name: Anna Mccarthy MRN: [id number] Date of Birth: 19-Oct-1955

## 2018-05-10 ENCOUNTER — Encounter: Payer: Self-pay | Admitting: Physical Therapy

## 2018-05-10 ENCOUNTER — Ambulatory Visit: Payer: 59 | Admitting: Physical Therapy

## 2018-05-10 NOTE — Therapy (Signed)
[place] MAIN [facility] SERVICES [address] [place], [place], [address] Phone: [phone]   Fax:  [phone]  Physical Therapy Treatment  Patient Details  Name: Anna Mccarthy MRN: [id number] Date of Birth: 1955/12/03 No data recorded  Encounter Date: 05/10/2018  PT End of Session - 05/10/18 1543    Visit Number  17    Number of Visits  22    Date for PT Re-Evaluation  06/04/18    PT Start Time  1330    PT Stop Time  1420    PT Time Calculation (min)  50 min    Activity Tolerance  Patient tolerated treatment well    Behavior During Therapy  [facility] for tasks assessed/performed       Past Medical History:  Diagnosis Date  . Allergy   . Anxiety   . Arthritis   . Bronchitis   . Cancer ([place])    skin  . GERD (gastroesophageal reflux disease)   . Hypertension     Past Surgical History:  Procedure Laterality Date  . ABDOMINAL HYSTERECTOMY  1996  . BREAST BIOPSY Left 2011   stereo done by dr. Bary Castilla   . COLONOSCOPY    . COLONOSCOPY WITH PROPOFOL N/A 05/30/2016   Procedure: COLONOSCOPY WITH PROPOFOL;  Surgeon: Lollie Sails, MD;  Location: [facility] ENDOSCOPY;  Service: Endoscopy;  Laterality: N/A;  . HERNIA REPAIR  2012  . KNEE ARTHROSCOPY WITH MEDIAL MENISECTOMY Left 02/18/2016   Procedure: KNEE ARTHROSCOPY WITH PARTIAL MEDIAL MENISECTOMY;  Surgeon: Hessie Knows, MD;  Location: [facility] ORS;  Service: Orthopedics;  Laterality: Left;  . TUBAL LIGATION  1984    There were no vitals filed for this visit.  Subjective Assessment - 05/10/18 1542    Subjective  Reports feeling good.  Feels she is progressing.    Pertinent History  meniscus repair 3 years ago, fibromyalgia    Currently in Pain?  No/denies    Pain Onset  More than a month ago       PT Aquatic Therapy  Enters/exits via ramp  Ambulation warm up  Forward 4 laps Sideways 2 laps Sideways with mini squat 2 laps  LE strength  Sumo squat 20x Side lunge with 1 blue dumb  bell 20x B  Suspended work 1 minute each 5 minutes total Jog Jack Ski X jack Mogul  2 L recover walk  Step Ispilateral step up with contralateral hip ext, focus on flat foot eccentric return B 10X  SLS With ball toss and catch 2 x each leg 45-60 minutes each side before needing rest  Resistive power walking with arms Forward 4 L Side 4 L  Active stretching  Hamstring/gastroc and hip flexorquad 3 x 10 sec each      PT Education - 05/10/18 1543    Education Details  exercise techniques    Person(s) Educated  Patient    Methods  Explanation;Demonstration    Comprehension  Verbalized understanding;Returned demonstration          PT Long Term Goals - 05/07/18 1300      PT LONG TERM GOAL #1   Title  Pt will be independent and compliant with HEP in order to maintain gins made in physical therapy and return to prior level of function.    Baseline  02/14/18: dependent on form and technique; 03/26/18: Moderate cueing for exercise performance    Time  4    Period  Weeks    Status  On-going  PT LONG TERM GOAL #2   Title  Pt will reports worst pain in past week <6/10 in order to increase tolerance to amb and return to prior level of function.    Baseline  02/14/18: worst pain 8/10; 03/26/2018: 3/10    Time  6    Period  Weeks    Status  Achieved      PT LONG TERM GOAL #3   Title  Pt will improve 6MWT distance from 1385m to 1569m with no increase in pain >1 on NPRS in order to demonstrate increased endurance, increased tolerance to long distance amb and to return to prior level of function.    Baseline  02/14/18: 1370 ft, pain increased to 7/10 at 3 minutes; 03/26/2018: 1764ft    Time  6    Period  Weeks    Status  Achieved      PT LONG TERM GOAL #4   Title  Pt will improve FOTO score from 30 to 51 in order to improve functional mobility and return to prior level of function.    Baseline  02/14/18: 30 ; 05/07/2018: 60    Time  6    Period  Weeks    Status   Achieved      PT LONG TERM GOAL #5   Title  Pt will ascend/descend steps with reciprocal pattern and no increase in pain in order to increase independence and safety while ambulating in the community.     Baseline  02/14/18: step to pattern, one handrail, incr pain; 9/30/19step through pattern - no UE support    Time  6    Period  Weeks    Status  Achieved      Additional Long Term Goals   Additional Long Term Goals  Yes      PT LONG TERM GOAL #6   Title  Patient will be able to stand on one leg for >10sec to improve propioception of the affected LE and to decrease fall risk    Baseline  8 sec B; 05/07/2018: 13 sec     Time  6    Period  Weeks    Status  Achieved      PT LONG TERM GOAL #7   Title  Patient will be demonstrate good understanding of pain science to allow for continous decrease in pain after discharge of therapy.     Baseline  Lack of pain science knowledge    Time  4    Period  Weeks    Status  New    Target Date  06/04/18            Plan - 05/10/18 1544    Clinical Impression Statement  Overall progressing well with good tolerance of activity.    Clinical Presentation  Stable    Clinical Decision Making  Low    Rehab Potential  Fair    Clinical Impairments Affecting Rehab Potential  (+)motivated, family support, active lifestyle (-)multiple chronic conditions     PT Frequency  3x / week    PT Duration  4 weeks    PT Treatment/Interventions  ADLs/Self Care Home Management;Aquatic Therapy;Biofeedback;Cryotherapy;Electrical Stimulation;Gait training;Ultrasound;Moist Heat;Stair training;Functional mobility training;Therapeutic activities;Therapeutic exercise;Balance training;Neuromuscular re-education;Manual techniques;Patient/family education;Passive range of motion;Dry needling    PT Next Visit Plan  balance asssessment, joint mobs, squat, TKE, lateral step ups    Consulted and Agree with Plan of Care  Patient       Patient will benefit from skilled  therapeutic intervention  in order to improve the following deficits and impairments:  Abnormal gait, Decreased range of motion, Difficulty walking, Decreased activity tolerance, Pain, Decreased balance, Decreased strength  Visit Diagnosis: Chronic pain of left knee  History of falling  Stiffness of left knee, not elsewhere classified     Problem List Patient Active Problem List   Diagnosis Date Noted  . Venous ulcer ([place]) 02/02/2017  . Varicose veins of bilateral lower extremities with pain 05/16/2016  . Chronic venous insufficiency 05/16/2016  . Pain in limb 05/16/2016  . Swelling of limb 05/16/2016  . DJD (degenerative joint disease) 05/16/2016    Chesley Noon, PTA 05/10/18, 3:45 PM   [place] MAIN [facility] SERVICES [address] [place], [place], [address] Phone: [phone]   Fax:  [phone]  Name: Anna Mccarthy MRN: [id number] Date of Birth: July 03, 1955

## 2018-05-15 ENCOUNTER — Ambulatory Visit: Payer: 59

## 2018-05-15 ENCOUNTER — Other Ambulatory Visit: Payer: Self-pay

## 2018-05-15 DIAGNOSIS — M25562 Pain in left knee: Secondary | ICD-10-CM | POA: Diagnosis not present

## 2018-05-15 DIAGNOSIS — Z9181 History of falling: Secondary | ICD-10-CM

## 2018-05-15 DIAGNOSIS — M25662 Stiffness of left knee, not elsewhere classified: Secondary | ICD-10-CM

## 2018-05-15 DIAGNOSIS — G8929 Other chronic pain: Secondary | ICD-10-CM

## 2018-05-15 NOTE — Therapy (Signed)
[place] MAIN [facility] SERVICES [address] [place], [place], [address] Phone: [phone]   Fax:  [phone]  Physical Therapy Treatment  Patient Details  Name: Anna Mccarthy MRN: [id number] Date of Birth: 03-12-1956 No data recorded  Encounter Date: 05/15/2018  PT End of Session - 05/15/18 1050    Visit Number  18    Number of Visits  22    Date for PT Re-Evaluation  06/04/18    PT Start Time  0945    PT Stop Time  1045    PT Time Calculation (min)  60 min    Activity Tolerance  Patient tolerated treatment well    Behavior During Therapy  [facility] for tasks assessed/performed       Past Medical History:  Diagnosis Date  . Allergy   . Anxiety   . Arthritis   . Bronchitis   . Cancer ([place])    skin  . GERD (gastroesophageal reflux disease)   . Hypertension     Past Surgical History:  Procedure Laterality Date  . ABDOMINAL HYSTERECTOMY  1996  . BREAST BIOPSY Left 2011   stereo done by dr. Bary Castilla   . COLONOSCOPY    . COLONOSCOPY WITH PROPOFOL N/A 05/30/2016   Procedure: COLONOSCOPY WITH PROPOFOL;  Surgeon: Lollie Sails, MD;  Location: [facility] ENDOSCOPY;  Service: Endoscopy;  Laterality: N/A;  . HERNIA REPAIR  2012  . KNEE ARTHROSCOPY WITH MEDIAL MENISECTOMY Left 02/18/2016   Procedure: KNEE ARTHROSCOPY WITH PARTIAL MEDIAL MENISECTOMY;  Surgeon: Hessie Knows, MD;  Location: [facility] ORS;  Service: Orthopedics;  Laterality: Left;  . TUBAL LIGATION  1984    There were no vitals filed for this visit.  Subjective Assessment - 05/15/18 1047    Subjective  Pt reports B knees feeling really good; notes pain at most is 2/10. Pt states she is able to get on/off floor to play with her grandchildren and is pleased. Pt is continuing to receive nerve blocks; pt is getting 8-12 hours complete relief post injection. Pt does have a prescription for ablasion, but will hold off while blocks are proving sucessful.     Pertinent History  meniscus repair  3 years ago, fibromyalgia      Warm up ambulation  4 L fwd  4 L side with mini squat  LE strength with 2# ankle wts, B 2 x 10 ea  Up and outs  Mod high plank hip ext  Mod high plank hip abd  Step up with contralateral hip ext and ipsilateral eccentric control return on flat foot  Suspended work 75 sec each; Total 6.25 min  Jog  Jack  Ski   X jack  Mogul  1 L recovery walk  Core with UE work in stable working lunge (90/90) position Requires some tactile cues and front foot brace assist at bench  Red dumbbells, 20 ea (alt forward foot each ex)   Triceps pressdown   Sh abd/add   Sh flex/ext   Sh horiz abd/add   Suspended work as above; 6.25 min followed by 1 L recovery walk  Resisted squats, 1 blue dumbbell, 20x  Active stretching, B 3 x ea  Hamstrings/gastroc and hip flexor/quad  Hip flexor at step                          PT Education - 05/15/18 1049    Education Details  Core with UE in advanced stable lunge position  Person(s) Educated  Patient    Methods  Explanation;Demonstration    Comprehension  Verbalized understanding;Returned demonstration;Verbal cues required          PT Long Term Goals - 05/07/18 1300      PT LONG TERM GOAL #1   Title  Pt will be independent and compliant with HEP in order to maintain gins made in physical therapy and return to prior level of function.    Baseline  02/14/18: dependent on form and technique; 03/26/18: Moderate cueing for exercise performance    Time  4    Period  Weeks    Status  On-going      PT LONG TERM GOAL #2   Title  Pt will reports worst pain in past week <6/10 in order to increase tolerance to amb and return to prior level of function.    Baseline  02/14/18: worst pain 8/10; 03/26/2018: 3/10    Time  6    Period  Weeks    Status  Achieved      PT LONG TERM GOAL #3   Title  Pt will improve 6MWT distance from 1344m to 1593m with no increase in pain >1 on NPRS in order to demonstrate  increased endurance, increased tolerance to long distance amb and to return to prior level of function.    Baseline  02/14/18: 1370 ft, pain increased to 7/10 at 3 minutes; 03/26/2018: 1768ft    Time  6    Period  Weeks    Status  Achieved      PT LONG TERM GOAL #4   Title  Pt will improve FOTO score from 30 to 51 in order to improve functional mobility and return to prior level of function.    Baseline  02/14/18: 30 ; 05/07/2018: 60    Time  6    Period  Weeks    Status  Achieved      PT LONG TERM GOAL #5   Title  Pt will ascend/descend steps with reciprocal pattern and no increase in pain in order to increase independence and safety while ambulating in the community.     Baseline  02/14/18: step to pattern, one handrail, incr pain; 9/30/19step through pattern - no UE support    Time  6    Period  Weeks    Status  Achieved      Additional Long Term Goals   Additional Long Term Goals  Yes      PT LONG TERM GOAL #6   Title  Patient will be able to stand on one leg for >10sec to improve propioception of the affected LE and to decrease fall risk    Baseline  8 sec B; 05/07/2018: 13 sec     Time  6    Period  Weeks    Status  Achieved      PT LONG TERM GOAL #7   Title  Patient will be demonstrate good understanding of pain science to allow for continous decrease in pain after discharge of therapy.     Baseline  Lack of pain science knowledge    Time  4    Period  Weeks    Status  New    Target Date  06/04/18            Plan - 05/15/18 1051    Rehab Potential  Fair    Clinical Impairments Affecting Rehab Potential  (+)motivated, family support, active lifestyle (-)multiple chronic conditions  PT Frequency  3x / week    PT Duration  4 weeks    PT Treatment/Interventions  ADLs/Self Care Home Management;Aquatic Therapy;Biofeedback;Cryotherapy;Electrical Stimulation;Gait training;Ultrasound;Moist Heat;Stair training;Functional mobility training;Therapeutic  activities;Therapeutic exercise;Balance training;Neuromuscular re-education;Manual techniques;Patient/family education;Passive range of motion;Dry needling    PT Next Visit Plan  balance asssessment, joint mobs, squat, TKE, lateral step ups    Consulted and Agree with Plan of Care  Patient       Patient will benefit from skilled therapeutic intervention in order to improve the following deficits and impairments:  Abnormal gait, Decreased range of motion, Difficulty walking, Decreased activity tolerance, Pain, Decreased balance, Decreased strength  Visit Diagnosis: Chronic pain of left knee  History of falling  Stiffness of left knee, not elsewhere classified     Problem List Patient Active Problem List   Diagnosis Date Noted  . Venous ulcer ([place]) 02/02/2017  . Varicose veins of bilateral lower extremities with pain 05/16/2016  . Chronic venous insufficiency 05/16/2016  . Pain in limb 05/16/2016  . Swelling of limb 05/16/2016  . DJD (degenerative joint disease) 05/16/2016    Larae Grooms 05/15/2018, 10:53 AM  [place] MAIN [facility] SERVICES [address] [place], [place], [address] Phone: [phone]   Fax:  [phone]  Name: Anna Mccarthy MRN: [id number] Date of Birth: 06-28-1955

## 2018-05-17 ENCOUNTER — Ambulatory Visit: Payer: 59

## 2018-05-17 DIAGNOSIS — M25562 Pain in left knee: Secondary | ICD-10-CM | POA: Diagnosis not present

## 2018-05-17 DIAGNOSIS — M25662 Stiffness of left knee, not elsewhere classified: Secondary | ICD-10-CM

## 2018-05-17 DIAGNOSIS — Z9181 History of falling: Secondary | ICD-10-CM

## 2018-05-17 DIAGNOSIS — G8929 Other chronic pain: Secondary | ICD-10-CM

## 2018-05-17 NOTE — Therapy (Signed)
[place] [facility] [address] [place], [address] Phone: [phone]   Fax:  [phone]  Physical Therapy Treatment  Patient Details  Name: Anna Mccarthy MRN: [id number] Date of Birth: Nov 23, 1955 No data recorded  Encounter Date: 05/17/2018  PT End of Session - 05/17/18 1550    Visit Number  19    Number of Visits  22    Date for PT Re-Evaluation  06/04/18    PT Start Time  1115    PT Stop Time  1200    PT Time Calculation (min)  45 min    Activity Tolerance  Patient tolerated treatment well    Behavior During Therapy  [facility] for tasks assessed/performed       Past Medical History:  Diagnosis Date  . Allergy   . Anxiety   . Arthritis   . Bronchitis   . Cancer ([place])    skin  . GERD (gastroesophageal reflux disease)   . Hypertension     Past Surgical History:  Procedure Laterality Date  . ABDOMINAL HYSTERECTOMY  1996  . BREAST BIOPSY Left 2011   stereo done by dr. Bary Castilla   . COLONOSCOPY    . COLONOSCOPY WITH PROPOFOL N/A 05/30/2016   Procedure: COLONOSCOPY WITH PROPOFOL;  Surgeon: Lollie Sails, MD;  Location: [facility] ENDOSCOPY;  Service: Endoscopy;  Laterality: N/A;  . HERNIA REPAIR  2012  . KNEE ARTHROSCOPY WITH MEDIAL MENISECTOMY Left 02/18/2016   Procedure: KNEE ARTHROSCOPY WITH PARTIAL MEDIAL MENISECTOMY;  Surgeon: Hessie Knows, MD;  Location: [facility] ORS;  Service: Orthopedics;  Laterality: Left;  . TUBAL LIGATION  1984    There were no vitals filed for this visit.  Subjective Assessment - 05/17/18 1341    Subjective  Patient reports overall her pain is improving and has been happy with her progress overall. Patient reports she was satisfied to [place] more about pain neuroscience the previous session.     Pertinent History  meniscus repair 3 years ago, fibromyalgia    Currently in Pain?  No/denies          TREATMENT Therapeutic Exercise Hip hiking - x 20 Running man off of airex  pad - x 20  Hip abduction at the machine - x 20 40# Sit to stands from chair - x25 Step ups onto 8" step - x 25  Pain education performed using metaphors and pictures comparing the nervous system as an alarm system and explained how over stressors of life can influence and increase a person's level of pain. Patient demonstrates good understanding of pain science explained and is able to show understanding at end of the session  Patient demonstrates good understanding of information at the end of the session    PT Education - 05/17/18 1549    Education Details  Pain education; form/technique with exercise    Person(s) Educated  Patient    Methods  Explanation;Demonstration    Comprehension  Verbalized understanding;Returned demonstration          PT Long Term Goals - 05/07/18 1300      PT LONG TERM GOAL #1   Title  Pt will be independent and compliant with HEP in order to maintain gins made in physical therapy and return to prior level of function.    Baseline  02/14/18: dependent on form and technique; 03/26/18: Moderate cueing for exercise performance    Time  4    Period  Weeks    Status  On-going      PT LONG TERM GOAL #2   Title  Pt will reports worst pain in past week <6/10 in order to increase tolerance to amb and return to prior level of function.    Baseline  02/14/18: worst pain 8/10; 03/26/2018: 3/10    Time  6    Period  Weeks    Status  Achieved      PT LONG TERM GOAL #3   Title  Pt will improve 6MWT distance from 1341m to 1549m with no increase in pain >1 on NPRS in order to demonstrate increased endurance, increased tolerance to long distance amb and to return to prior level of function.    Baseline  02/14/18: 1370 ft, pain increased to 7/10 at 3 minutes; 03/26/2018: 1794ft    Time  6    Period  Weeks    Status  Achieved      PT LONG TERM GOAL #4   Title  Pt will improve FOTO score from 30 to 51 in order to improve functional mobility and return to prior level of  function.    Baseline  02/14/18: 30 ; 05/07/2018: 60    Time  6    Period  Weeks    Status  Achieved      PT LONG TERM GOAL #5   Title  Pt will ascend/descend steps with reciprocal pattern and no increase in pain in order to increase independence and safety while ambulating in the community.     Baseline  02/14/18: step to pattern, one handrail, incr pain; 9/30/19step through pattern - no UE support    Time  6    Period  Weeks    Status  Achieved      Additional Long Term Goals   Additional Long Term Goals  Yes      PT LONG TERM GOAL #6   Title  Patient will be able to stand on one leg for >10sec to improve propioception of the affected LE and to decrease fall risk    Baseline  8 sec B; 05/07/2018: 13 sec     Time  6    Period  Weeks    Status  Achieved      PT LONG TERM GOAL #7   Title  Patient will be demonstrate good understanding of pain science to allow for continous decrease in pain after discharge of therapy.     Baseline  Lack of pain science knowledge    Time  4    Period  Weeks    Status  New    Target Date  06/04/18            Plan - 05/17/18 1550    Clinical Impression Statement  Patient demonstrates improvement in pain education knowledge and is able to return explanation of pain science back to therapist indicating functional carryover. Patient demonstrates improvement overall in function and conitnues to have overall decrease in pain compard to the previous sessions. Patient continues to have pain with prolonged walking activities and will benefit from further skilled therapy to return to prior level of function.     Rehab Potential  Fair    Clinical Impairments Affecting Rehab Potential  (+)motivated, family support, active lifestyle (-)multiple chronic conditions     PT Frequency  3x / week    PT Duration  4 weeks    PT Treatment/Interventions  ADLs/Self Care Home Management;Aquatic Therapy;Biofeedback;Cryotherapy;Electrical Stimulation;Gait  training;Ultrasound;Moist Heat;Stair training;Functional mobility training;Therapeutic activities;Therapeutic exercise;Balance training;Neuromuscular re-education;Manual  techniques;Patient/family education;Passive range of motion;Dry needling    PT Next Visit Plan  balance asssessment, joint mobs, squat, TKE, lateral step ups    Consulted and Agree with Plan of Care  Patient       Patient will benefit from skilled therapeutic intervention in order to improve the following deficits and impairments:  Abnormal gait, Decreased range of motion, Difficulty walking, Decreased activity tolerance, Pain, Decreased balance, Decreased strength  Visit Diagnosis: Chronic pain of left knee  History of falling  Stiffness of left knee, not elsewhere classified     Problem List Patient Active Problem List   Diagnosis Date Noted  . Venous ulcer ([place]) 02/02/2017  . Varicose veins of bilateral lower extremities with pain 05/16/2016  . Chronic venous insufficiency 05/16/2016  . Pain in limb 05/16/2016  . Swelling of limb 05/16/2016  . DJD (degenerative joint disease) 05/16/2016    Blythe Stanford, PT DPT 05/17/2018, 3:53 PM  [facility] [address], [place], [address] Phone: [phone]   Fax:  [phone]  Name: BRIAN KOCOUREK MRN: [id number] Date of Birth: 15-Oct-1955

## 2018-05-21 ENCOUNTER — Ambulatory Visit: Payer: 59

## 2018-05-29 ENCOUNTER — Ambulatory Visit: Payer: 59

## 2018-05-31 ENCOUNTER — Ambulatory Visit: Payer: 59

## 2018-06-05 ENCOUNTER — Encounter: Payer: Self-pay | Admitting: Physical Therapy

## 2018-06-05 ENCOUNTER — Ambulatory Visit: Payer: 59

## 2018-06-05 ENCOUNTER — Ambulatory Visit: Payer: 59 | Admitting: Physical Therapy

## 2018-06-05 DIAGNOSIS — Z9181 History of falling: Secondary | ICD-10-CM | POA: Insufficient documentation

## 2018-06-05 DIAGNOSIS — M25662 Stiffness of left knee, not elsewhere classified: Secondary | ICD-10-CM | POA: Insufficient documentation

## 2018-06-05 DIAGNOSIS — M25562 Pain in left knee: Secondary | ICD-10-CM | POA: Insufficient documentation

## 2018-06-05 DIAGNOSIS — G8929 Other chronic pain: Secondary | ICD-10-CM | POA: Insufficient documentation

## 2018-06-05 NOTE — Therapy (Signed)
[place] MAIN [facility] SERVICES [address] [place], [place], [address] Phone: [phone]   Fax:  [phone]  Physical Therapy Treatment  Patient Details  Name: Anna Mccarthy MRN: [id number] Date of Birth: [birth year] No data recorded  Encounter Date: 06/05/2018  PT End of Session - 06/05/18 1416    Visit Number  20    Number of Visits  22    Date for PT Re-Evaluation  06/04/18    PT Start Time  5009    PT Stop Time  1100    PT Time Calculation (min)  45 min    Activity Tolerance  Patient tolerated treatment well       Past Medical History:  Diagnosis Date  . Allergy   . Anxiety   . Arthritis   . Bronchitis   . Cancer ([place])    skin  . GERD (gastroesophageal reflux disease)   . Hypertension     Past Surgical History:  Procedure Laterality Date  . ABDOMINAL HYSTERECTOMY  1996  . BREAST BIOPSY Left 2011   stereo done by dr. Bary Castilla   . COLONOSCOPY    . COLONOSCOPY WITH PROPOFOL N/A 05/30/2016   Procedure: COLONOSCOPY WITH PROPOFOL;  Surgeon: Lollie Sails, MD;  Location: [facility] ENDOSCOPY;  Service: Endoscopy;  Laterality: N/A;  . HERNIA REPAIR  2012  . KNEE ARTHROSCOPY WITH MEDIAL MENISECTOMY Left 02/18/2016   Procedure: KNEE ARTHROSCOPY WITH PARTIAL MEDIAL MENISECTOMY;  Surgeon: Hessie Knows, MD;  Location: [facility] ORS;  Service: Orthopedics;  Laterality: Left;  . TUBAL LIGATION  1984    There were no vitals filed for this visit.  Subjective Assessment - 06/05/18 1415    Subjective  Some L  knee pain noted the past few days.  Feels like it is "sliding" but no pain reported.    Pertinent History  meniscus repair 3 years ago, fibromyalgia    Currently in Pain?  No/denies         Aquatic Therapy:  Warm up ambulation 4 L forward 4 L side with mini squat  LE strength with 2# ankle weights B 2 x 10 Hip ext Flex/ext Marches  Suspended work 75 seconds each total 6.25 minutes Jog Jack Ski X jack Mogul  1L  recovery walk  Core against wall shoulder deep red dumb bells  Scapular retraction Scapular depression shld ab/add Flex flexion Horizontal ab/add   Suspended work as above followed by 1 L recovery walk  Step ups with LE ext x 15 bilaterally with rail for support    PT Education - 06/05/18 1416    Education Details  exercise techniques, monitor L knee for increased pain or changes.    Person(s) Educated  Patient    Methods  Explanation;Demonstration    Comprehension  Verbalized understanding          PT Long Term Goals - 05/07/18 1300      PT LONG TERM GOAL #1   Title  Pt will be independent and compliant with HEP in order to maintain gins made in physical therapy and return to prior level of function.    Baseline  02/14/18: dependent on form and technique; 03/26/18: Moderate cueing for exercise performance    Time  4    Period  Weeks    Status  On-going      PT LONG TERM GOAL #2   Title  Pt will reports worst pain in past week <6/10 in order to increase tolerance to amb  and return to prior level of function.    Baseline  02/14/18: worst pain 8/10; 03/26/2018: 3/10    Time  6    Period  Weeks    Status  Achieved      PT LONG TERM GOAL #3   Title  Pt will improve 6MWT distance from 1360m to 15100m with no increase in pain >1 on NPRS in order to demonstrate increased endurance, increased tolerance to long distance amb and to return to prior level of function.    Baseline  02/14/18: 1370 ft, pain increased to 7/10 at 3 minutes; 03/26/2018: 1732ft    Time  6    Period  Weeks    Status  Achieved      PT LONG TERM GOAL #4   Title  Pt will improve FOTO score from 30 to 51 in order to improve functional mobility and return to prior level of function.    Baseline  02/14/18: 30 ; 05/07/2018: 60    Time  6    Period  Weeks    Status  Achieved      PT LONG TERM GOAL #5   Title  Pt will ascend/descend steps with reciprocal pattern and no increase in pain in order to increase  independence and safety while ambulating in the community.     Baseline  02/14/18: step to pattern, one handrail, incr pain; 9/30/19step through pattern - no UE support    Time  6    Period  Weeks    Status  Achieved      Additional Long Term Goals   Additional Long Term Goals  Yes      PT LONG TERM GOAL #6   Title  Patient will be able to stand on one leg for >10sec to improve propioception of the affected LE and to decrease fall risk    Baseline  8 sec B; 05/07/2018: 13 sec     Time  6    Period  Weeks    Status  Achieved      PT LONG TERM GOAL #7   Title  Patient will be demonstrate good understanding of pain science to allow for continous decrease in pain after discharge of therapy.     Baseline  Lack of pain science knowledge    Time  4    Period  Weeks    Status  New    Target Date  06/04/18            Plan - 06/05/18 1418    Clinical Presentation  Stable    Clinical Decision Making  Low    Rehab Potential  Fair    PT Frequency  3x / week    PT Duration  4 weeks    PT Treatment/Interventions  ADLs/Self Care Home Management;Aquatic Therapy;Biofeedback;Cryotherapy;Electrical Stimulation;Gait training;Ultrasound;Moist Heat;Stair training;Functional mobility training;Therapeutic activities;Therapeutic exercise;Balance training;Neuromuscular re-education;Manual techniques;Patient/family education;Passive range of motion;Dry needling    PT Next Visit Plan  balance asssessment, joint mobs, squat, TKE, lateral step ups    Consulted and Agree with Plan of Care  Patient       Patient will benefit from skilled therapeutic intervention in order to improve the following deficits and impairments:     Visit Diagnosis: Chronic pain of left knee  History of falling  Stiffness of left knee, not elsewhere classified     Problem List Patient Active Problem List   Diagnosis Date Noted  . Venous ulcer ([place]) 02/02/2017  . Varicose veins of bilateral  lower extremities with  pain 05/16/2016  . Chronic venous insufficiency 05/16/2016  . Pain in limb 05/16/2016  . Swelling of limb 05/16/2016  . DJD (degenerative joint disease) 05/16/2016    Chesley Noon 06/05/2018, 2:19 PM  [place] MAIN [facility] SERVICES [address] [place], [place], [address] Phone: [phone]   Fax:  [phone]  Name: Anna Mccarthy MRN: [id number] Date of Birth: [birth year]

## 2018-06-07 ENCOUNTER — Ambulatory Visit: Payer: 59 | Attending: Student in an Organized Health Care Education/Training Program

## 2018-06-12 ENCOUNTER — Ambulatory Visit: Payer: 59

## 2018-06-12 ENCOUNTER — Other Ambulatory Visit: Payer: Self-pay

## 2018-06-12 DIAGNOSIS — Z9181 History of falling: Secondary | ICD-10-CM

## 2018-06-12 DIAGNOSIS — M25562 Pain in left knee: Principal | ICD-10-CM

## 2018-06-12 DIAGNOSIS — M25662 Stiffness of left knee, not elsewhere classified: Secondary | ICD-10-CM

## 2018-06-12 DIAGNOSIS — G8929 Other chronic pain: Secondary | ICD-10-CM | POA: Diagnosis present

## 2018-06-12 NOTE — Therapy (Signed)
[place] MAIN [facility] SERVICES [address] [place], [place], [address] Phone: [phone]   Fax:  [phone]  Physical Therapy Treatment  Patient Details  Name: Anna Mccarthy MRN: [id number] Date of Birth: 09/02/1955 No data recorded  Encounter Date: 06/12/2018  PT End of Session - 06/12/18 1353    Visit Number  21    Number of Visits  22    Date for PT Re-Evaluation  06/04/18    PT Start Time  0945    PT Stop Time  1050    PT Time Calculation (min)  65 min    Activity Tolerance  Patient tolerated treatment well    Behavior During Therapy  [facility] for tasks assessed/performed       Past Medical History:  Diagnosis Date  . Allergy   . Anxiety   . Arthritis   . Bronchitis   . Cancer ([place])    skin  . GERD (gastroesophageal reflux disease)   . Hypertension     Past Surgical History:  Procedure Laterality Date  . ABDOMINAL HYSTERECTOMY  1996  . BREAST BIOPSY Left 2011   stereo done by dr. Bary Castilla   . COLONOSCOPY    . COLONOSCOPY WITH PROPOFOL N/A 05/30/2016   Procedure: COLONOSCOPY WITH PROPOFOL;  Surgeon: Lollie Sails, MD;  Location: [facility] ENDOSCOPY;  Service: Endoscopy;  Laterality: N/A;  . HERNIA REPAIR  2012  . KNEE ARTHROSCOPY WITH MEDIAL MENISECTOMY Left 02/18/2016   Procedure: KNEE ARTHROSCOPY WITH PARTIAL MEDIAL MENISECTOMY;  Surgeon: Hessie Knows, MD;  Location: [facility] ORS;  Service: Orthopedics;  Laterality: Left;  . TUBAL LIGATION  1984    There were no vitals filed for this visit.  Subjective Assessment - 06/12/18 1350    Subjective  Pt notes some overall increase in L knee pain. Notes it happens when "I step a certain way, or wrong", but basically describes as happening with usual stepping fwd/bkwd. Pt states she feels a sharp, sudden pain. Also notes a side to side sliding/shearing type discomfort.     Pertinent History  meniscus repair 3 years ago, fibromyalgia      Ambulation warm up  4 L fwd  2 L side  2 L  side with minisquat  Resisted squat with 1 blue dumbbell, 30x  Suspended work, 75 sec each (total 6.25 min)  Jog  Jack  Ski  X jack  Mogul  1 L recovery with thumb up posterior arm drag  Resisted walking with Mitts  4 L fwd (arms in sh ER and thumb up)  4 L bkwd with breast stroke arms and incorporated SLS balance btn steps  4 L Side step with thumb up/elbows flexed to 90 fwd  Suspended work as above for 6.25 min  Bench, 4 min ea  Core stabilized with    Bike   Scissor   Flutter   Step ups with ipsilateral hip ext, B 20x ea  Active stretching  Ham/gastroc and hip flexor/quad, 4x ea B  Seated piriformis, 4 x ea B                           PT Education - 06/12/18 1353    Education Details  Strengthening muscles surrounding/affecting knee joing to compensate for any laxity in the joing structure.           PT Long Term Goals - 05/07/18 1300      PT LONG TERM GOAL #  1   Title  Pt will be independent and compliant with HEP in order to maintain gins made in physical therapy and return to prior level of function.    Baseline  02/14/18: dependent on form and technique; 03/26/18: Moderate cueing for exercise performance    Time  4    Period  Weeks    Status  On-going      PT LONG TERM GOAL #2   Title  Pt will reports worst pain in past week <6/10 in order to increase tolerance to amb and return to prior level of function.    Baseline  02/14/18: worst pain 8/10; 03/26/2018: 3/10    Time  6    Period  Weeks    Status  Achieved      PT LONG TERM GOAL #3   Title  Pt will improve 6MWT distance from 1367m to 1598m with no increase in pain >1 on NPRS in order to demonstrate increased endurance, increased tolerance to long distance amb and to return to prior level of function.    Baseline  02/14/18: 1370 ft, pain increased to 7/10 at 3 minutes; 03/26/2018: 1762ft    Time  6    Period  Weeks    Status  Achieved      PT LONG TERM GOAL #4   Title  Pt will  improve FOTO score from 30 to 51 in order to improve functional mobility and return to prior level of function.    Baseline  02/14/18: 30 ; 05/07/2018: 60    Time  6    Period  Weeks    Status  Achieved      PT LONG TERM GOAL #5   Title  Pt will ascend/descend steps with reciprocal pattern and no increase in pain in order to increase independence and safety while ambulating in the community.     Baseline  02/14/18: step to pattern, one handrail, incr pain; 9/30/19step through pattern - no UE support    Time  6    Period  Weeks    Status  Achieved      Additional Long Term Goals   Additional Long Term Goals  Yes      PT LONG TERM GOAL #6   Title  Patient will be able to stand on one leg for >10sec to improve propioception of the affected LE and to decrease fall risk    Baseline  8 sec B; 05/07/2018: 13 sec     Time  6    Period  Weeks    Status  Achieved      PT LONG TERM GOAL #7   Title  Patient will be demonstrate good understanding of pain science to allow for continous decrease in pain after discharge of therapy.     Baseline  Lack of pain science knowledge    Time  4    Period  Weeks    Status  New    Target Date  06/04/18            Plan - 06/12/18 1354    Clinical Impression Statement  Pt tolerated session well managing interval type workout. Increased resistive walking exercises and added balance/stability component. Cues required during suspended work to maintain suspended position intermittently, but overall performing with improved range and speed for both muscular strength and cardio benefit while protecting knee joints.     Rehab Potential  Fair    PT Frequency  3x / week    PT  Duration  4 weeks    PT Treatment/Interventions  ADLs/Self Care Home Management;Aquatic Therapy;Biofeedback;Cryotherapy;Electrical Stimulation;Gait training;Ultrasound;Moist Heat;Stair training;Functional mobility training;Therapeutic activities;Therapeutic exercise;Balance  training;Neuromuscular re-education;Manual techniques;Patient/family education;Passive range of motion;Dry needling    PT Next Visit Plan  balance asssessment, joint mobs, squat, TKE, lateral step ups    Consulted and Agree with Plan of Care  Patient       Patient will benefit from skilled therapeutic intervention in order to improve the following deficits and impairments:     Visit Diagnosis: Chronic pain of left knee  History of falling  Stiffness of left knee, not elsewhere classified     Problem List Patient Active Problem List   Diagnosis Date Noted  . Venous ulcer ([place]) 02/02/2017  . Varicose veins of bilateral lower extremities with pain 05/16/2016  . Chronic venous insufficiency 05/16/2016  . Pain in limb 05/16/2016  . Swelling of limb 05/16/2016  . DJD (degenerative joint disease) 05/16/2016    Larae Grooms 06/12/2018, 2:00 PM  [place] MAIN [facility] SERVICES [address] [place], [place], [address] Phone: [phone]   Fax:  [phone]  Name: MINAMI ARRIAGA MRN: [id number] Date of Birth: May 01, 1956

## 2018-06-14 ENCOUNTER — Ambulatory Visit: Payer: 59

## 2018-06-14 ENCOUNTER — Other Ambulatory Visit: Payer: Self-pay

## 2018-06-14 DIAGNOSIS — G8929 Other chronic pain: Secondary | ICD-10-CM

## 2018-06-14 DIAGNOSIS — M25562 Pain in left knee: Secondary | ICD-10-CM | POA: Diagnosis not present

## 2018-06-14 DIAGNOSIS — Z9181 History of falling: Secondary | ICD-10-CM

## 2018-06-14 DIAGNOSIS — M25662 Stiffness of left knee, not elsewhere classified: Secondary | ICD-10-CM

## 2018-06-14 NOTE — Therapy (Signed)
[place] MAIN [facility] SERVICES [address] [place], [place], [address] Phone: [phone]   Fax:  [phone]  Physical Therapy Treatment  Patient Details  Name: Anna Mccarthy MRN: [id number] Date of Birth: [birth year] No data recorded  Encounter Date: 06/14/2018  PT End of Session - 06/14/18 1251    Visit Number  22    Number of Visits  22    Date for PT Re-Evaluation  06/04/18    PT Start Time  1145    PT Stop Time  1245    PT Time Calculation (min)  60 min    Activity Tolerance  Patient tolerated treatment well    Behavior During Therapy  [facility] for tasks assessed/performed       Past Medical History:  Diagnosis Date  . Allergy   . Anxiety   . Arthritis   . Bronchitis   . Cancer ([place])    skin  . GERD (gastroesophageal reflux disease)   . Hypertension     Past Surgical History:  Procedure Laterality Date  . ABDOMINAL HYSTERECTOMY  1996  . BREAST BIOPSY Left 2011   stereo done by dr. Bary Castilla   . COLONOSCOPY    . COLONOSCOPY WITH PROPOFOL N/A 05/30/2016   Procedure: COLONOSCOPY WITH PROPOFOL;  Surgeon: Lollie Sails, MD;  Location: [facility] ENDOSCOPY;  Service: Endoscopy;  Laterality: N/A;  . HERNIA REPAIR  2012  . KNEE ARTHROSCOPY WITH MEDIAL MENISECTOMY Left 02/18/2016   Procedure: KNEE ARTHROSCOPY WITH PARTIAL MEDIAL MENISECTOMY;  Surgeon: Hessie Knows, MD;  Location: [facility] ORS;  Service: Orthopedics;  Laterality: Left;  . TUBAL LIGATION  1984    There were no vitals filed for this visit.  Subjective Assessment - 06/14/18 1250    Subjective  Pt reports L knee pain 2/10. Tolerated last aquatic session quite well    Pertinent History  meniscus repair 3 years ago, fibromyalgia      Warm up ambulation  2 L fwd  4 L side with minisquat  Lunges, 1 blue dumbbell  B side 20x ea  B fwd 20x ea  Suspended work, 75 sec each (total 6.25 sec)  Jog  Barnabas Lister  Ski  X jack  [employer]  2 L recovery walk  Step ups (ipsilateral) with  contralateral hip ext; 2# wts, B 20x ea (ensure step down with flat foot)  Stand single leg bicycle (fwd/bkwd) 20x ea; B  Suspended work as above 6.25 seconds with 2 L recovery walk  Active stretching x 10 min LB, hams, quads, hip flexors, gastroc and LB                       PT Education - 06/14/18 1253    Education Details  Single side stand bike cw/ccw with wts          PT Long Term Goals - 05/07/18 1300      PT LONG TERM GOAL #1   Title  Pt will be independent and compliant with HEP in order to maintain gins made in physical therapy and return to prior level of function.    Baseline  02/14/18: dependent on form and technique; 03/26/18: Moderate cueing for exercise performance    Time  4    Period  Weeks    Status  On-going      PT LONG TERM GOAL #2   Title  Pt will reports worst pain in past week <6/10 in order to increase tolerance to  amb and return to prior level of function.    Baseline  02/14/18: worst pain 8/10; 03/26/2018: 3/10    Time  6    Period  Weeks    Status  Achieved      PT LONG TERM GOAL #3   Title  Pt will improve 6MWT distance from 1373m to 158m with no increase in pain >1 on NPRS in order to demonstrate increased endurance, increased tolerance to long distance amb and to return to prior level of function.    Baseline  02/14/18: 1370 ft, pain increased to 7/10 at 3 minutes; 03/26/2018: 1724ft    Time  6    Period  Weeks    Status  Achieved      PT LONG TERM GOAL #4   Title  Pt will improve FOTO score from 30 to 51 in order to improve functional mobility and return to prior level of function.    Baseline  02/14/18: 30 ; 05/07/2018: 60    Time  6    Period  Weeks    Status  Achieved      PT LONG TERM GOAL #5   Title  Pt will ascend/descend steps with reciprocal pattern and no increase in pain in order to increase independence and safety while ambulating in the community.     Baseline  02/14/18: step to pattern, one handrail, incr pain;  9/30/19step through pattern - no UE support    Time  6    Period  Weeks    Status  Achieved      Additional Long Term Goals   Additional Long Term Goals  Yes      PT LONG TERM GOAL #6   Title  Patient will be able to stand on one leg for >10sec to improve propioception of the affected LE and to decrease fall risk    Baseline  8 sec B; 05/07/2018: 13 sec     Time  6    Period  Weeks    Status  Achieved      PT LONG TERM GOAL #7   Title  Patient will be demonstrate good understanding of pain science to allow for continous decrease in pain after discharge of therapy.     Baseline  Lack of pain science knowledge    Time  4    Period  Weeks    Status  New    Target Date  06/04/18            Plan - 06/14/18 1251    Clinical Impression Statement  Pt performed session well. Cues for core stabilization with single side bike with wts. Improved lunge form this session. Continue PT to progress strength in core and LEs for carryover to land stability/strength for improved function and movement tolerance with limited pain.     Rehab Potential  Fair    PT Frequency  3x / week    PT Duration  4 weeks    PT Treatment/Interventions  ADLs/Self Care Home Management;Aquatic Therapy;Biofeedback;Cryotherapy;Electrical Stimulation;Gait training;Ultrasound;Moist Heat;Stair training;Functional mobility training;Therapeutic activities;Therapeutic exercise;Balance training;Neuromuscular re-education;Manual techniques;Patient/family education;Passive range of motion;Dry needling    PT Next Visit Plan  balance asssessment, joint mobs, squat, TKE, lateral step ups    Consulted and Agree with Plan of Care  Patient       Patient will benefit from skilled therapeutic intervention in order to improve the following deficits and impairments:     Visit Diagnosis: Chronic pain of left knee  History of  falling  Stiffness of left knee, not elsewhere classified     Problem List Patient Active Problem  List   Diagnosis Date Noted  . Venous ulcer ([place]) 02/02/2017  . Varicose veins of bilateral lower extremities with pain 05/16/2016  . Chronic venous insufficiency 05/16/2016  . Pain in limb 05/16/2016  . Swelling of limb 05/16/2016  . DJD (degenerative joint disease) 05/16/2016    Anna Mccarthy 06/14/2018, 12:54 PM  [place] MAIN [facility] SERVICES [address] [place], [place], [address] Phone: [phone]   Fax:  [phone]  Name: Anna Mccarthy MRN: [id number] Date of Birth: March 17, 1956

## 2018-06-25 ENCOUNTER — Ambulatory Visit: Payer: 59

## 2018-06-27 HISTORY — PX: CARPAL TUNNEL RELEASE: SHX101

## 2018-07-30 ENCOUNTER — Ambulatory Visit (HOSPITAL_BASED_OUTPATIENT_CLINIC_OR_DEPARTMENT_OTHER): Payer: 59 | Admitting: Student in an Organized Health Care Education/Training Program

## 2018-07-30 ENCOUNTER — Encounter: Payer: Self-pay | Admitting: Student in an Organized Health Care Education/Training Program

## 2018-07-30 ENCOUNTER — Ambulatory Visit
Admission: RE | Admit: 2018-07-30 | Discharge: 2018-07-30 | Disposition: A | Discharge status: Home / Self Care | Payer: 59 | Source: Ambulatory Visit | Attending: Student in an Organized Health Care Education/Training Program | Admitting: Student in an Organized Health Care Education/Training Program

## 2018-07-30 DIAGNOSIS — M1712 Unilateral primary osteoarthritis, left knee: Secondary | ICD-10-CM

## 2018-07-30 MED ORDER — LACTATED RINGERS IV SOLN
1000.0000 mL | Freq: Once | INTRAVENOUS | Status: AC
  Administered 2018-07-30: 1000 mL via INTRAVENOUS

## 2018-07-30 MED ORDER — FENTANYL CITRATE (PF) 100 MCG/2ML IJ SOLN
25.0000 ug | INTRAMUSCULAR | Status: DC | PRN
  Administered 2018-07-30: 75 ug via INTRAVENOUS
  Filled 2018-07-30: qty 2

## 2018-07-30 MED ORDER — LIDOCAINE HCL 2 % IJ SOLN
20.0000 mL | Freq: Once | INTRAMUSCULAR | Status: AC
  Administered 2018-07-30: 400 mg
  Filled 2018-07-30: qty 20

## 2018-07-30 MED ORDER — DEXAMETHASONE SODIUM PHOSPHATE 10 MG/ML IJ SOLN
10.0000 mg | Freq: Once | INTRAMUSCULAR | Status: AC
  Administered 2018-07-30: 10 mg
  Filled 2018-07-30: qty 1

## 2018-07-30 MED ORDER — ROPIVACAINE HCL 2 MG/ML IJ SOLN
10.0000 mL | Freq: Once | INTRAMUSCULAR | Status: AC
  Administered 2018-07-30: 10 mL
  Filled 2018-07-30: qty 10

## 2018-07-30 MED ORDER — LIDOCAINE HCL 2 % IJ SOLN
INTRAMUSCULAR | Status: AC
  Filled 2018-07-30: qty 20

## 2018-07-30 NOTE — Patient Instructions (Signed)

## 2018-07-30 NOTE — Progress Notes (Signed)
Patient's Name: Anna Mccarthy  MRN: [id number]  Referring Provider: Gillis Santa, MD  DOB: 1956-04-06  PCP: [place] Pink, MD  DOS: 07/30/2018  Note by: Gillis Santa, MD  Service setting: Ambulatory outpatient  Specialty: Interventional Pain Management  Patient type: Established  Location: [facility] (AMB) Pain Management Facility  Visit type: Interventional Procedure   Primary Reason for Visit: Interventional Pain Management Treatment. CC: Knee Pain (left)  Procedure:          Anesthesia, Analgesia, Anxiolysis:  Type: Therapeutic Superior-lateral, Superior-medial, and Inferior-medial, Genicular Nerve Radiofrequency Ablation.  #1  Region: Lateral, Anterior, and Medial aspects of the knee joint, above and below the knee joint proper. Level: Superior and inferior to the knee joint. Laterality: Left  Type: Moderate (Conscious) Sedation combined with Local Anesthesia Indication(s): Analgesia and Anxiety Route: Intravenous (IV) IV Access: Secured Sedation: Meaningful verbal contact was maintained at all times during the procedure  Local Anesthetic: Lidocaine 1-2%  Position: Supine   Indications: 1. Primary osteoarthritis of left knee    Ms. Petterson has been dealing with the above chronic pain for longer than three months and has either failed to respond, was unable to tolerate, or simply did not get enough benefit from other more conservative therapies including, but not limited to: 1. Over-the-counter medications 2. Anti-inflammatory medications 3. Muscle relaxants 4. Membrane stabilizers 5. Opioids 6. Physical therapy and/or chiropractic manipulation 7. Modalities (Heat, ice, etc.) 8. Invasive techniques such as nerve blocks. Ms. Lewing has attained more than 50% relief of the pain from a series of diagnostic injections conducted in separate occasions.  Pain Score: Pre-procedure: 6 /10 Post-procedure: 0-No pain/10  Pre-op Assessment:  Ms. Wolaver is a 63 y.o. (year old), female patient,  seen today for interventional treatment. She  has a past surgical history that includes Tubal ligation (1984); Abdominal hysterectomy (1996); Hernia repair (2012); Colonoscopy; Knee arthroscopy with medial menisectomy (Left, 02/18/2016); Colonoscopy with propofol (N/A, 05/30/2016); and Breast biopsy (Left, 2011). Ms. Nies has a current medication list which includes the following prescription(s): alprazolam, duloxetine, escitalopram, hydrochlorothiazide, hydroxychloroquine, omeprazole, tizanidine, alprazolam, b complex vitamins, omega-3 fatty acids, tramadol, turmeric, and vitamin e, and the following Facility-Administered Medications: fentanyl. Her primarily concern today is the Knee Pain (left)  Initial Vital Signs:  Pulse/HCG Rate: 74ECG Heart Rate: 64 Temp: 98.1 F (36.7 C) Resp: 16 BP: (!) 147/88 SpO2: 99 %  BMI: Estimated body mass index is 30.11 kg/m as calculated from the following:   Height as of this encounter: 5\' 3"  (1.6 m).   Weight as of this encounter: 170 lb (77.1 kg).  Risk Assessment: Allergies: Reviewed. She is allergic to percocet [oxycodone-acetaminophen] and sulfa antibiotics.  Allergy Precautions: None required Coagulopathies: Reviewed. None identified.  Blood-thinner therapy: None at this time Active Infection(s): Reviewed. None identified. Ms. Brines is afebrile  Site Confirmation: Ms. Russman was asked to confirm the procedure and laterality before marking the site Procedure checklist: Completed Consent: Before the procedure and under the influence of no sedative(s), amnesic(s), or anxiolytics, the patient was informed of the treatment options, risks and possible complications. To fulfill our ethical and legal obligations, as recommended by the American Medical Association's Code of Ethics, I have informed the patient of my clinical impression; the nature and purpose of the treatment or procedure; the risks, benefits, and possible complications of the intervention; the  alternatives, including doing nothing; the risk(s) and benefit(s) of the alternative treatment(s) or procedure(s); and the risk(s) and benefit(s) of doing nothing. The patient was provided  information about the general risks and possible complications associated with the procedure. These may include, but are not limited to: failure to achieve desired goals, infection, bleeding, organ or nerve damage, allergic reactions, paralysis, and death. In addition, the patient was informed of those risks and complications associated to the procedure, such as failure to decrease pain; infection; bleeding; organ or nerve damage with subsequent damage to sensory, motor, and/or autonomic systems, resulting in permanent pain, numbness, and/or weakness of one or several areas of the body; allergic reactions; (i.e.: anaphylactic reaction); and/or death. Furthermore, the patient was informed of those risks and complications associated with the medications. These include, but are not limited to: allergic reactions (i.e.: anaphylactic or anaphylactoid reaction(s)); adrenal axis suppression; blood sugar elevation that in diabetics may result in ketoacidosis or comma; water retention that in patients with history of congestive heart failure may result in shortness of breath, pulmonary edema, and decompensation with resultant heart failure; weight gain; swelling or edema; medication-induced neural toxicity; particulate matter embolism and blood vessel occlusion with resultant organ, and/or nervous system infarction; and/or aseptic necrosis of one or more joints. Finally, the patient was informed that Medicine is not an exact science; therefore, there is also the possibility of unforeseen or unpredictable risks and/or possible complications that may result in a catastrophic outcome. The patient indicated having understood very clearly. We have given the patient no guarantees and we have made no promises. Enough time was given to the  patient to ask questions, all of which were answered to the patient's satisfaction. Ms. Games has indicated that she wanted to continue with the procedure. Attestation: I, the ordering provider, attest that I have discussed with the patient the benefits, risks, side-effects, alternatives, likelihood of achieving goals, and potential problems during recovery for the procedure that I have provided informed consent. Date  Time: 07/30/2018  8:10 AM  Pre-Procedure Preparation:  Monitoring: As per clinic protocol. Respiration, ETCO2, SpO2, BP, heart rate and rhythm monitor placed and checked for adequate function Safety Precautions: Patient was assessed for positional comfort and pressure points before starting the procedure. Time-out: I initiated and conducted the "Time-out" before starting the procedure, as per protocol. The patient was asked to participate by confirming the accuracy of the "Time Out" information. Verification of the correct person, site, and procedure were performed and confirmed by me, the nursing staff, and the patient. "Time-out" conducted as per Joint Commission's Universal Protocol (UP.01.01.01). Time: 6314  Description of Procedure:          Target Area: For Genicular Nerve block(s), the targets are: the superior-lateral genicular nerve, located in the lateral distal portion of the femoral shaft as it curves to form the lateral epicondyle, in the region of the distal femoral metaphysis; the superior-medial genicular nerve, located in the medial distal portion of the femoral shaft as it curves to form the medial epicondyle; and the inferior-medial genicular nerve, located in the medial, proximal portion of the tibial shaft, as it curves to form the medial epicondyle, in the region of the proximal tibial metaphysis. Approach: Anterior, ipsilateral approach. Area Prepped: Entire knee area, from mid-thigh to mid-shin, lateral, anterior, and medial aspects. Prepping solution: Hibiclens  (4.0% Chlorhexidine gluconate solution) Safety Precautions: Aspiration looking for blood return was conducted prior to all injections. At no point did we inject any substances, as a needle was being advanced. No attempts were made at seeking any paresthesias. Safe injection practices and needle disposal techniques used. Medications properly checked for expiration dates. SDV (  single dose vial) medications used. Description of the Procedure: Protocol guidelines were followed. The patient was placed in position over the procedure table. The target area was identified and the area prepped in the usual manner. The skin and muscle were infiltrated with local anesthetic. Appropriate amount of time allowed to pass for local anesthetics to take effect. Radiofrequency needles were introduced to the target area using fluoroscopic guidance. Using the NeuroTherm NT1100 Radiofrequency Generator, sensory stimulation using 50 Hz was used to locate & identify the nerve, making sure that the needle was positioned such that there was no sensory stimulation below 0.3 V or above 0.7 V. Stimulation using 2 Hz was used to evaluate the motor component. Care was taken not to lesion any nerves that demonstrated motor stimulation of the lower extremities at an output of less than 2.5 times that of the sensory threshold, or a maximum of 2.0 V. Once satisfactory placement of the needles was achieved, the numbing solution was slowly injected after negative aspiration. After waiting for at least 2 minutes, the ablation was performed at 80 degrees C for 60 seconds, using regular Radiofrequency settings. Once the procedure was completed, the needles were then removed and the area cleansed, making sure to leave some of the prepping solution back to take advantage of its long term bactericidal properties. Intra-operative Compliance: Compliant Vitals:   07/30/18 0920 07/30/18 0930 07/30/18 0941 07/30/18 0950  BP: (!) 152/95 (!) 134/91 140/90 (!)  157/94  Pulse:      Resp: 10 11 15 17   Temp:  (!) 97.4 F (36.3 C)  98.6 F (37 C)  TempSrc:      SpO2: 98% 94% 99% 97%  Weight:      Height:        Start Time: 0850 hrs. End Time: 0919 hrs. Materials & Medications:  Needle(s) Type: Teflon-coated, curved tip, Radiofrequency needle(s) Gauge: 22G Length: 10cm Medication(s): Please see orders for medications and dosing details. 5 cc solution made of 4 cc of 0.2% ropivacaine, 1 cc of Decadron 10 mg/cc.  1.5 cc injected at each level post ablation. Imaging Guidance (Non-Spinal):          Type of Imaging Technique: Fluoroscopy Guidance (Non-Spinal) Indication(s): Assistance in needle guidance and placement for procedures requiring needle placement in or near specific anatomical locations not easily accessible without such assistance. Exposure Time: Please see nurses notes. Contrast: Before injecting any contrast, we confirmed that the patient did not have an allergy to iodine, shellfish, or radiological contrast. Once satisfactory needle placement was completed at the desired level, radiological contrast was injected. Contrast injected under live fluoroscopy. No contrast complications. See chart for type and volume of contrast used. Fluoroscopic Guidance: I was personally present during the use of fluoroscopy. "Tunnel Vision Technique" used to obtain the best possible view of the target area. Parallax error corrected before commencing the procedure. "Direction-depth-direction" technique used to introduce the needle under continuous pulsed fluoroscopy. Once target was reached, antero-posterior, oblique, and lateral fluoroscopic projection used confirm needle placement in all planes. Images permanently stored in EMR. Interpretation: I personally interpreted the imaging intraoperatively. Adequate needle placement confirmed in multiple planes. Appropriate spread of contrast into desired area was observed. No evidence of afferent or efferent  intravascular uptake. Permanent images saved into the patient's record.  Antibiotic Prophylaxis:   Anti-infectives (From admission, onward)   None     Indication(s): None identified  Post-operative Assessment:  Post-procedure Vital Signs:  Pulse/HCG Rate: 7466 Temp: 98.6 F (37 C)  Resp: 17 BP: (!) 157/94 SpO2: 97 %  EBL: None  Complications: No immediate post-treatment complications observed by team, or reported by patient.  Note: The patient tolerated the entire procedure well. A repeat set of vitals were taken after the procedure and the patient was kept under observation following institutional policy, for this type of procedure. Post-procedural neurological assessment was performed, showing return to baseline, prior to discharge. The patient was provided with post-procedure discharge instructions, including a section on how to identify potential problems. Should any problems arise concerning this procedure, the patient was given instructions to immediately contact us, at any time, without hesitation. In any case, we plan to contact the patient by telephone for a follow-up status report regarding this interventional procedure.  Comments:  No additional relevant information.  Plan of Care    Imaging Orders     DG C-Arm 1-60 Min-No Report Procedure Orders    No procedure(s) ordered today    Medications ordered for procedure: Meds ordered this encounter  Medications  . lactated ringers infusion 1,000 mL  . fentaNYL (SUBLIMAZE) injection 25-100 mcg    Make sure Narcan is available in the pyxis when using this medication. In the event of respiratory depression (RR< 8/min): Titrate NARCAN (naloxone) in increments of 0.1 to 0.2 mg IV at 2-3 minute intervals, until desired degree of reversal.  . ropivacaine (PF) 2 mg/mL (0.2%) (NAROPIN) injection 10 mL  . lidocaine (XYLOCAINE) 2 % (with pres) injection 400 mg  . dexamethasone (DECADRON) injection 10 mg   Medications  administered: We administered lactated ringers, fentaNYL, ropivacaine (PF) 2 mg/mL (0.2%), lidocaine, and dexamethasone.  See the medical record for exact dosing, route, and time of administration.  Disposition: Discharge home  Discharge Date & Time: 07/30/2018; 0951 hrs.   Physician-requested Follow-up: Return in about 6 weeks (around 09/10/2018) for Post Procedure Evaluation.  Future Appointments  Date Time Provider [place]  09/11/2018  8:30 AM Gillis Santa, MD [facility] None   Primary Care Physician: Maryland Pink, MD Location: [facility] Note by: Gillis Santa, MD Date: 07/30/2018; Time: 3:03 PM  Disclaimer:  Medicine is not an exact science. The only guarantee in medicine is that nothing is guaranteed. It is important to note that the decision to proceed with this intervention was based on the information collected from the patient. The Data and conclusions were drawn from the patient's questionnaire, the interview, and the physical examination. Because the information was provided in large part by the patient, it cannot be guaranteed that it has not been purposely or unconsciously manipulated. Every effort has been made to obtain as much relevant data as possible for this evaluation. It is important to note that the conclusions that lead to this procedure are derived in large part from the available data. Always take into account that the treatment will also be dependent on availability of resources and existing treatment guidelines, considered by other Pain Management Practitioners as being common knowledge and practice, at the time of the intervention. For Medico-Legal purposes, it is also important to point out that variation in procedural techniques and pharmacological choices are the acceptable norm. The indications, contraindications, technique, and results of the above procedure should only be interpreted and judged by a Board-Certified  Interventional Pain Specialist with extensive familiarity and expertise in the same exact procedure and technique.

## 2018-07-31 ENCOUNTER — Telehealth: Payer: Self-pay

## 2018-07-31 NOTE — Telephone Encounter (Signed)
Post procedure phone call.  Patient states she is doing well.  

## 2018-08-15 ENCOUNTER — Other Ambulatory Visit: Payer: Self-pay | Admitting: Family Medicine

## 2018-08-15 DIAGNOSIS — Z1231 Encounter for screening mammogram for malignant neoplasm of breast: Secondary | ICD-10-CM

## 2018-08-29 ENCOUNTER — Ambulatory Visit
Admission: RE | Admit: 2018-08-29 | Discharge: 2018-08-29 | Disposition: A | Discharge status: Home / Self Care | Payer: 59 | Source: Ambulatory Visit | Attending: Family Medicine | Admitting: Family Medicine

## 2018-08-29 DIAGNOSIS — Z1231 Encounter for screening mammogram for malignant neoplasm of breast: Secondary | ICD-10-CM | POA: Insufficient documentation

## 2018-09-11 ENCOUNTER — Encounter: Payer: Self-pay | Admitting: Student in an Organized Health Care Education/Training Program

## 2018-09-11 ENCOUNTER — Ambulatory Visit
Payer: 59 | Attending: Student in an Organized Health Care Education/Training Program | Admitting: Student in an Organized Health Care Education/Training Program

## 2018-09-11 ENCOUNTER — Other Ambulatory Visit: Payer: Self-pay

## 2018-09-11 VITALS — BP 137/105 | HR 73 | Temp 98.4°F | Resp 18 | Ht 62.0 in | Wt 170.0 lb

## 2018-09-11 DIAGNOSIS — G8929 Other chronic pain: Secondary | ICD-10-CM | POA: Diagnosis present

## 2018-09-11 DIAGNOSIS — G894 Chronic pain syndrome: Secondary | ICD-10-CM | POA: Insufficient documentation

## 2018-09-11 DIAGNOSIS — M25562 Pain in left knee: Secondary | ICD-10-CM | POA: Diagnosis present

## 2018-09-11 DIAGNOSIS — M1712 Unilateral primary osteoarthritis, left knee: Secondary | ICD-10-CM | POA: Insufficient documentation

## 2018-09-11 DIAGNOSIS — M23307 Other meniscus derangements, unspecified meniscus, left knee: Secondary | ICD-10-CM | POA: Insufficient documentation

## 2018-09-11 DIAGNOSIS — M797 Fibromyalgia: Secondary | ICD-10-CM | POA: Diagnosis present

## 2018-09-11 NOTE — Progress Notes (Signed)
Safety precautions to be maintained throughout the outpatient stay will include: orient to surroundings, keep bed in low position, maintain call bell within reach at all times, provide assistance with transfer out of bed and ambulation.  

## 2018-09-11 NOTE — Progress Notes (Signed)
Patient's Name: Anna Mccarthy  MRN: [id number]  Referring Provider: Maryland Pink, MD  DOB: 12-05-1955  PCP: Maryland Pink, MD  DOS: 09/11/2018  Note by: Gillis Santa, MD  Service setting: Ambulatory outpatient  Specialty: Interventional Pain Management  Location: [facility] (AMB) Pain Management Facility    Patient type: Established   Primary Reason(s) for Visit: Encounter for post-procedure evaluation of chronic illness with mild to moderate exacerbation CC: Knee Pain (left)  HPI  Anna Mccarthy is a 63 y.o. year old, female patient, who comes today for a post-procedure evaluation. She has Varicose veins of bilateral lower extremities with pain; Chronic venous insufficiency; Pain in limb; Swelling of limb; DJD (degenerative joint disease); and Venous ulcer (HCC) on their problem list. Her primarily concern today is the Knee Pain (left)  Pain Assessment: Location: Left Knee Radiating: sometimes up into left hip Onset: More than a month ago Duration: Chronic pain Quality: Aching, Dull Severity: 2 /10 (subjective, self-reported pain score)  Note: Reported level is compatible with observation.                         When using our objective Pain Scale, levels between 6 and 10/10 are said to belong in an emergency room, as it progressively worsens from a 6/10, described as severely limiting, requiring emergency care not usually available at an outpatient pain management facility. At a 6/10 level, communication becomes difficult and requires great effort. Assistance to reach the emergency department may be required. Facial flushing and profuse sweating along with potentially dangerous increases in heart rate and blood pressure will be evident. Effect on ADL: "work around it" Timing: Intermittent Modifying factors: rest, procedure, ice, elevation BP: (!) 137/105  HR: 73  Anna Mccarthy comes in today for post-procedure evaluation.  Further details on both, my assessment(s), as well as the proposed treatment  plan, please see below.  Post-Procedure Assessment  07/30/2018 Procedure: Left genicular nerve RFA Pre-procedure pain score:  6/10 Post-procedure pain score: 0/10         Influential Factors: BMI: 31.09 kg/m Intra-procedural challenges: None observed.         Assessment challenges: None detected.              Reported side-effects: None.        Post-procedural adverse reactions or complications: None reported         Sedation: Please see nurses note. When no sedatives are used, the analgesic levels obtained are directly associated to the effectiveness of the local anesthetics. However, when sedation is provided, the level of analgesia obtained during the initial 1 hour following the intervention, is believed to be the result of a combination of factors. These factors may include, but are not limited to: 1. The effectiveness of the local anesthetics used. 2. The effects of the analgesic(s) and/or anxiolytic(s) used. 3. The degree of discomfort experienced by the patient at the time of the procedure. 4. The patients ability and reliability in recalling and recording the events. 5. The presence and influence of possible secondary gains and/or psychosocial factors. Reported result: Relief experienced during the 1st hour after the procedure: 100 % (Ultra-Short Term Relief)            Interpretative annotation: Clinically appropriate result. Analgesia during this period is likely to be Local Anesthetic and/or IV Sedative (Analgesic/Anxiolytic) related.          Effects of local anesthetic: The analgesic effects attained during this period are directly  associated to the localized infiltration of local anesthetics and therefore cary significant diagnostic value as to the etiological location, or anatomical origin, of the pain. Expected duration of relief is directly dependent on the pharmacodynamics of the local anesthetic used. Long-acting (4-6 hours) anesthetics used.  Reported result: Relief during  the next 4 to 6 hour after the procedure: 100 % (Short-Term Relief)            Interpretative annotation: Clinically appropriate result. Analgesia during this period is likely to be Local Anesthetic-related.          Long-term benefit: Defined as the period of time past the expected duration of local anesthetics (1 hour for short-acting and 4-6 hours for long-acting). With the possible exception of prolonged sympathetic blockade from the local anesthetics, benefits during this period are typically attributed to, or associated with, other factors such as analgesic sensory neuropraxia, antiinflammatory effects, or beneficial biochemical changes provided by agents other than the local anesthetics.  Reported result: Extended relief following procedure: 100 %(experiencing soreness/"like bruising" at the end of the first day lasting for about  4 weeks, then began to feel better, knee is better than before procedure, but I continue to experience some pain - pain is worse at night") (Long-Term Relief)            Interpretative annotation: Clinically possible results. Good relief. No permanent benefit expected. Inflammation plays a part in the etiology to the pain.          Current benefits: Defined as reported results that persistent at this point in time.   Analgesia: 50 %            Function: Somewhat improved ROM: Somewhat improved Interpretative annotation: Ongoing benefit. Limited therapeutic benefit. Effective therapeutic approach.          Interpretation: Results would suggest Anna Mccarthy to be a good candidate for Radiofrequency Ablation.                  Plan:  Please see "Plan of Care" for details.                 Laboratory Chemistry  Inflammation Markers (CRP: Acute Phase) (ESR: Chronic Phase) No results found for: CRP, ESRSEDRATE, LATICACIDVEN                       Rheumatology Markers No results found for: RF, ANA, LABURIC, URICUR, LYMEIGGIGMAB, LYMEABIGMQN, HLAB27                       Renal Function Markers Lab Results  Component Value Date   BUN 18 10/07/2014   CREATININE 0.86 10/07/2014   GFRAA >60 10/07/2014   GFRNONAA >60 10/07/2014                             Hepatic Function Markers Lab Results  Component Value Date   AST 24 10/07/2014   ALT 20 10/07/2014   ALBUMIN 4.4 10/07/2014   ALKPHOS 78 10/07/2014                        Electrolytes Lab Results  Component Value Date   NA 135 10/07/2014   K 3.7 10/07/2014   CL 99 (L) 10/07/2014   CALCIUM 9.0 10/07/2014  Neuropathy Markers No results found for: VITAMINB12, FOLATE, HGBA1C, HIV                      CNS Tests No results found for: COLORCSF, APPEARCSF, RBCCOUNTCSF, WBCCSF, POLYSCSF, LYMPHSCSF, EOSCSF, PROTEINCSF, GLUCCSF, JCVIRUS, CSFOLI, IGGCSF                      Bone Pathology Markers No results found for: VD25OH, EK800LK9ZPH, XT0569VX4, IA1655VZ4, 25OHVITD1, 25OHVITD2, 25OHVITD3, TESTOFREE, TESTOSTERONE                       Coagulation Parameters Lab Results  Component Value Date   PLT 387 10/07/2014                        Cardiovascular Markers Lab Results  Component Value Date   TROPONINI <0.03 10/07/2014   HGB 13.5 10/07/2014   HCT 39.6 10/07/2014                         CA Markers No results found for: CEA, CA125, LABCA2                      Endocrine Markers No results found for: TSH, FREET4, TESTOFREE, TESTOSTERONE, ESTRADIOL, ESTRADIOLPCT, ESTRADIOLFRE                      Note: Lab results reviewed.  Recent Diagnostic Imaging Results  MM 3D SCREEN BREAST BILATERAL CLINICAL DATA:  Screening.  EXAM: DIGITAL SCREENING BILATERAL MAMMOGRAM WITH TOMO AND CAD  COMPARISON:  Previous exam(s).  ACR Breast Density Category c: The breast tissue is heterogeneously dense, which may obscure small masses.  FINDINGS: There are no findings suspicious for malignancy. Images were processed with CAD.  IMPRESSION: No mammographic evidence of  malignancy. A result letter of this screening mammogram will be mailed directly to the patient.  RECOMMENDATION: Screening mammogram in one year. (Code:SM-B-01Y)  BI-RADS CATEGORY  1: Negative.  Electronically Signed   By: Fidela Salisbury M.D.   On: 08/29/2018 11:25  Complexity Note: Imaging results reviewed. Results shared with Anna Mccarthy, using Layman's terms.                               Meds   Current Outpatient Medications:  .  ALPRAZolam (XANAX) 0.25 MG tablet, TK 1 T PO BID PRN, Disp: , Rfl: 0 .  ALPRAZolam (XANAX) 0.5 MG tablet, Take 0.5 mg by mouth at bedtime. , Disp: , Rfl: 2 .  b complex vitamins tablet, Take 1 tablet by mouth daily., Disp: , Rfl:  .  DULoxetine (CYMBALTA) 20 MG capsule, 20 mg 2 (two) times daily. , Disp: , Rfl:  .  escitalopram (LEXAPRO) 20 MG tablet, Take 20 mg by mouth at bedtime. , Disp: , Rfl:  .  hydrochlorothiazide (HYDRODIURIL) 25 MG tablet, Take 25 mg by mouth daily., Disp: , Rfl:  .  hydroxychloroquine (PLAQUENIL) 200 MG tablet, Take 200 mg by mouth 2 (two) times daily., Disp: , Rfl:  .  Omega-3 Fatty Acids (FISH OIL PO), Take by mouth., Disp: , Rfl:  .  omeprazole (PRILOSEC) 40 MG capsule, Take 40 mg by mouth daily., Disp: , Rfl:  .  tiZANidine (ZANAFLEX) 2 MG tablet, Take by mouth at bedtime., Disp: , Rfl:  .  traMADol (ULTRAM)  50 MG tablet, Take by mouth., Disp: , Rfl:  .  Turmeric 1053 MG TABS, Take by mouth., Disp: , Rfl:  .  vitamin E 100 UNIT capsule, Take 100 Units by mouth at bedtime. , Disp: , Rfl:   ROS  Constitutional: Denies any fever or chills Gastrointestinal: No reported hemesis, hematochezia, vomiting, or acute GI distress Musculoskeletal: Denies any acute onset joint swelling, redness, loss of ROM, or weakness Neurological: No reported episodes of acute onset apraxia, aphasia, dysarthria, agnosia, amnesia, paralysis, loss of coordination, or loss of consciousness  Allergies  Anna Mccarthy is allergic to percocet  [oxycodone-acetaminophen] and sulfa antibiotics.  PFSH  Drug: Anna Mccarthy  reports no history of drug use. Alcohol:  reports current alcohol use of about 2.0 standard drinks of alcohol per week. Tobacco:  reports that she has never smoked. She has never used smokeless tobacco. Medical:  has a past medical history of Allergy, Anxiety, Arthritis, Bronchitis, Cancer ([place]), GERD (gastroesophageal reflux disease), and Hypertension. Surgical: Anna Mccarthy  has a past surgical history that includes Tubal ligation (1984); Abdominal hysterectomy (1996); Hernia repair (2012); Colonoscopy; Knee arthroscopy with medial menisectomy (Left, 02/18/2016); Colonoscopy with propofol (N/A, 05/30/2016); and Breast biopsy (Left, 2011). Family: family history includes Breast cancer in her cousin and maternal aunt; Ovarian cancer in her sister.  Constitutional Exam  General appearance: Well nourished, well developed, and well hydrated. In no apparent acute distress Vitals:   09/11/18 0842  BP: (!) 137/105  Pulse: 73  Resp: 18  Temp: 98.4 F (36.9 C)  TempSrc: Oral  SpO2: 99%  Weight: 170 lb (77.1 kg)  Height: _0  (1.575 m)   BMI Assessment: Estimated body mass index is 31.09 kg/m as calculated from the following:   Height as of this encounter: _1  (1.575 m).   Weight as of this encounter: 170 lb (77.1 kg).  BMI interpretation table: BMI level Category Range association with higher incidence of chronic pain  <18 kg/m2 Underweight   18.5-24.9 kg/m2 Ideal body weight   25-29.9 kg/m2 Overweight Increased incidence by 20%  30-34.9 kg/m2 Obese (Class I) Increased incidence by 68%  35-39.9 kg/m2 Severe obesity (Class II) Increased incidence by 136%  >40 kg/m2 Extreme obesity (Class III) Increased incidence by 254%   Patient's current BMI Ideal Body weight  Body mass index is 31.09 kg/m. Ideal body weight: 50.1 kg (110 lb 7.2 oz) Adjusted ideal body weight: 60.9 kg (134 lb 4.3 oz)   BMI Readings from Last 4  Encounters:  09/11/18 31.09 kg/m  07/30/18 30.11 kg/m  04/05/18 31.09 kg/m  03/05/18 31.09 kg/m   Wt Readings from Last 4 Encounters:  09/11/18 170 lb (77.1 kg)  07/30/18 170 lb (77.1 kg)  04/05/18 170 lb (77.1 kg)  03/05/18 170 lb (77.1 kg)  Psych/Mental status: Alert, oriented x 3 (person, place, & time)       Eyes: PERLA Respiratory: No evidence of acute respiratory distress  Lumbar Spine Area Exam  Skin & Axial Inspection: No masses, redness, or swelling Alignment: Symmetrical Functional ROM: Unrestricted ROM       Stability: No instability detected Muscle Tone/Strength: Functionally intact. No obvious neuro-muscular anomalies detected. Sensory (Neurological): Unimpaired Palpation: No palpable anomalies       Provocative Tests: Hyperextension/rotation test: deferred today       Lumbar quadrant test (Kemp's test): deferred today       Lateral bending test: deferred today       Patrick's Maneuver: deferred today  FABER* test: deferred today                   S-I anterior distraction/compression test: deferred today         S-I lateral compression test: deferred today         S-I Thigh-thrust test: deferred today         S-I Gaenslen's test: deferred today         *(Flexion, ABduction and External Rotation)  Gait & Posture Assessment  Ambulation: Unassisted Gait: Relatively normal for age and body habitus Posture: WNL   Lower Extremity Exam    Side: Right lower extremity  Side: Left lower extremity  Stability: No instability observed          Stability: No instability observed          Skin & Extremity Inspection: Skin color, temperature, and hair growth are WNL. No peripheral edema or cyanosis. No masses, redness, swelling, asymmetry, or associated skin lesions. No contractures.  Skin & Extremity Inspection: Skin color, temperature, and hair growth are WNL. No peripheral edema or cyanosis. No masses, redness, swelling, asymmetry, or associated skin  lesions. No contractures.  Functional ROM: Unrestricted ROM                  Functional ROM: Improved after treatment                  Muscle Tone/Strength: Functionally intact. No obvious neuro-muscular anomalies detected.  Muscle Tone/Strength: Functionally intact. No obvious neuro-muscular anomalies detected.  Sensory (Neurological): Unimpaired        Sensory (Neurological): Improved        DTR: Patellar: deferred today Achilles: deferred today Plantar: deferred today  DTR: Patellar: deferred today Achilles: deferred today Plantar: deferred today  Palpation: No palpable anomalies  Palpation: No palpable anomalies   Assessment   Status Diagnosis  Controlled Controlled Controlled 1. Primary osteoarthritis of left knee   2. Chronic pain of left knee   3. Meniscus degeneration, left   4. Fibromyalgia   5. Chronic pain syndrome      63 year old female with a history of left knee osteoarthritis who follows up status post left knee genicular radiofrequency ablation.  Patient endorses increased procedural site pain for the first couple of weeks after the ablation but states that she noted benefit in approximately 3 to 4 weeks after the ablation.  She finds it easier to bear weight and do ADLs.  She is now complaining of increased hip pain left greater than right which she thinks is unrelated to her knee.  She is also interested in participating in physical therapy again as her insurance is changed.  Will place referral for PT and have patient follow-up in approximately 3 months to assess her progress.  Plan of Care  Lab-work, procedure(s), and/or referral(s): Orders Placed This Encounter  Procedures  . Ambulatory referral to Physical Therapy   Time Note: Greater than 50% of the 25 minute(s) of face-to-face time spent with Anna Mccarthy, was spent in counseling/coordination of care regarding: PT, Anna Mccarthy primary cause of pain, the treatment plan, the results, interpretation and  significance of  her recent diagnostic interventional treatment(s), realistic expectations and the goals of pain management (increased in functionality).  Provider-requested follow-up: Return in about 3 months (around 12/12/2018).  Future Appointments  Date Time Provider [place]  12/04/2018  8:30 AM Gillis Santa, MD [facility] None    Primary Care Physician: Maryland Pink, MD Location: [facility]  Pain Management Facility Note by: Gillis Santa, M.D Date: 09/11/2018; Time: 9:11 AM  There are no Patient Instructions on file for this visit.

## 2018-12-03 ENCOUNTER — Telehealth: Payer: Self-pay | Admitting: Student in an Organized Health Care Education/Training Program

## 2018-12-03 NOTE — Telephone Encounter (Signed)
Pt stated she was returning Lori's call about her medication for her appt tomorrow

## 2018-12-03 NOTE — Telephone Encounter (Signed)
Called patient back, went to voicemail, gave nurses station number so she can call straight to [place].

## 2018-12-04 ENCOUNTER — Other Ambulatory Visit: Payer: Self-pay

## 2018-12-04 ENCOUNTER — Ambulatory Visit
Payer: 59 | Attending: Student in an Organized Health Care Education/Training Program | Admitting: Student in an Organized Health Care Education/Training Program

## 2018-12-04 ENCOUNTER — Encounter: Payer: Self-pay | Admitting: Student in an Organized Health Care Education/Training Program

## 2018-12-04 DIAGNOSIS — M797 Fibromyalgia: Secondary | ICD-10-CM | POA: Diagnosis not present

## 2018-12-04 DIAGNOSIS — G894 Chronic pain syndrome: Secondary | ICD-10-CM

## 2018-12-04 DIAGNOSIS — M25562 Pain in left knee: Secondary | ICD-10-CM | POA: Diagnosis not present

## 2018-12-04 DIAGNOSIS — G8929 Other chronic pain: Secondary | ICD-10-CM

## 2018-12-04 DIAGNOSIS — M1712 Unilateral primary osteoarthritis, left knee: Secondary | ICD-10-CM

## 2018-12-04 NOTE — Progress Notes (Signed)
Pain Management Virtual Encounter Note - Virtual Visit via Telephone Telehealth (real-time audio visits between healthcare provider and patient).   Patient's Phone No. & Preferred Pharmacy:  [phone] (home); [phone] (mobile); (Preferred) [phone] [email or web address]  [id number] [address], [address] [place] [address] Phone: [phone] Fax: [phone]  [place], [place] - [address] [address] Phone: [phone] Fax: [phone]    Pre-screening note:  Our staff contacted Ms. Aquilino and offered her an "in person", "face-to-face" appointment versus a telephone encounter. She indicated preferring the telephone encounter, at this time.   Reason for Virtual Visit: COVID-19*  Social distancing based on CDC and AMA recommendations.   I contacted [name] on 12/04/2018 via telephone.      I clearly identified myself as Gillis Santa, MD. I verified that I was speaking with the correct person using two identifiers (Name: DARALYN BERT, and date of birth: 25-Feb-1956).  Advanced Informed Consent I sought verbal advanced consent from [name] for virtual visit interactions. I informed Ms. Balducci of possible security and privacy concerns, risks, and limitations associated with providing "not-in-person" medical evaluation and management services. I also informed Ms. Devaux of the availability of "in-person" appointments. Finally, I informed her that there would be a charge for the virtual visit and that she could be  personally, fully or partially, financially responsible for it. Ms. Bently expressed understanding and agreed to proceed.   Historic Elements   Ms. SIRIYAH AMBROSIUS is a 63 y.o. year old, female patient evaluated today after her last encounter by our practice on 12/03/2018. Ms. Reddix  has a past medical  history of Allergy, Anxiety, Arthritis, Bronchitis, Cancer ([place]), GERD (gastroesophageal reflux disease), and Hypertension. She also  has a past surgical history that includes Tubal ligation (1984); Abdominal hysterectomy (1996); Hernia repair (2012); Colonoscopy; Knee arthroscopy with medial menisectomy (Left, 02/18/2016); Colonoscopy with propofol (N/A, 05/30/2016); and Breast biopsy (Left, 2011). Ms. Ebersole has a current medication list which includes the following prescription(s): alprazolam, duloxetine, escitalopram, hydrochlorothiazide, hydroxychloroquine, omeprazole, tizanidine, alprazolam, b complex vitamins, fluocinonide, omega-3 fatty acids, tramadol, turmeric, and vitamin e. She  reports that she has never smoked. She has never used smokeless tobacco. She reports current alcohol use of about 2.0 standard drinks of alcohol per week. She reports that she does not use drugs. Ms. Bischoff is allergic to percocet [oxycodone-acetaminophen] and sulfa antibiotics.   HPI  Today, she is being contacted for assessment of left knee pain and associated with left hip pain   Doing well overall but having increased knee pain when she is standing for longer than 1 hr. Is walking more often. Is doing more household chores. S/p L Genicular RFA on 07/30/2018, states that it is providing good relief. Unable to do PT after our last session due to Covid. Follow up prn for repeat L Genicular nerve RFA.   Pertinent Labs   SAFETY SCREENING Profile No results found for: SARSCOV2NAA, COVIDSOURCE, STAPHAUREUS, MRSAPCR, HCVAB, HIV, PREGTESTUR Renal Function Lab Results  Component Value Date   BUN 18 10/07/2014   CREATININE 0.86 10/07/2014   GFRAA >60 10/07/2014   GFRNONAA >60 10/07/2014   Hepatic Function Lab Results  Component Value Date   AST 24 10/07/2014   ALT 20 10/07/2014   ALBUMIN 4.4 10/07/2014   UDS No results found for:  SUMMARY Note: Above Lab results reviewed.  Recent imaging  MM 3D SCREEN BREAST  BILATERAL CLINICAL DATA:  Screening.  EXAM: DIGITAL SCREENING BILATERAL MAMMOGRAM WITH TOMO AND CAD  COMPARISON:  Previous exam(s).  ACR Breast Density Category c: The breast tissue is heterogeneously dense, which may obscure small masses.  FINDINGS: There are no findings suspicious for malignancy. Images were processed with CAD.  IMPRESSION: No mammographic evidence of malignancy. A result letter of this screening mammogram will be mailed directly to the patient.  RECOMMENDATION: Screening mammogram in one year. (Code:SM-B-01Y)  BI-RADS CATEGORY  1: Negative.  Electronically Signed   By: Fidela Salisbury M.D.   On: 08/29/2018 11:25  Assessment  The primary encounter diagnosis was Primary osteoarthritis of left knee. Diagnoses of Chronic pain of left knee, Fibromyalgia, and Chronic pain syndrome were also pertinent to this visit.  Plan of Care  I am having Lashica C. Altizer maintain her hydrochlorothiazide, escitalopram, omeprazole, ALPRAZolam, vitamin E, b complex vitamins, Omega-3 Fatty Acids (FISH OIL PO), Turmeric, traMADol, ALPRAZolam, DULoxetine, tiZANidine, hydroxychloroquine, and fluocinonide.  PharmacotherapOrders:  Orders Placed This Encounter  Procedures  . Radiofrequency,Genicular    For knee pain.    Standing Status:   Standing    Number of Occurrences:   1    Standing Expiration Date:   06/04/2020    Scheduling Instructions:     Side(s): Left knee Knee     Level(s): Superior-Lateral, Superior-Medial, and Inferior-Medial Genicular Nerve(s)     Sedation: With Sedation     TIMEFRAME: PRN procedure. (Ms. Wetherell will call when needed.)    Order Specific Question:   Where will this procedure be performed?    Answer:   [facility]   Follow-up plan:   Return if symptoms worsen or fail to improve.    I discussed the assessment and treatment plan with the patient. The patient was provided an opportunity to ask questions and all were answered. The  patient agreed with the plan and demonstrated an understanding of the instructions.  Patient advised to call back or seek an in-person evaluation if the symptoms or condition worsens.  Total duration of non-face-to-face encounter: 62minutes.  Note by: Gillis Santa, MD Date: 12/04/2018; Time: 9:59 AM  Note: This dictation was prepared with Dragon dictation. Any transcriptional errors that may result from this process are unintentional.  Disclaimer:  * Given the special circumstances of the COVID-19 pandemic, the federal government has announced that the Office for Civil Rights (OCR) will exercise its enforcement discretion and will not impose penalties on physicians using telehealth in the event of noncompliance with regulatory requirements under the [place] (HIPAA) in connection with the good faith provision of telehealth during the YDXAJ-28 national public health emergency. ([place])

## 2019-01-25 ENCOUNTER — Encounter: Payer: Self-pay | Admitting: General Surgery

## 2019-10-03 ENCOUNTER — Encounter: Payer: Self-pay | Admitting: Student in an Organized Health Care Education/Training Program

## 2019-10-07 ENCOUNTER — Encounter: Payer: Self-pay | Admitting: Student in an Organized Health Care Education/Training Program

## 2019-10-07 ENCOUNTER — Ambulatory Visit
Payer: Medicare Other | Attending: Student in an Organized Health Care Education/Training Program | Admitting: Student in an Organized Health Care Education/Training Program

## 2019-10-07 ENCOUNTER — Other Ambulatory Visit: Payer: Self-pay

## 2019-10-07 DIAGNOSIS — M1712 Unilateral primary osteoarthritis, left knee: Secondary | ICD-10-CM | POA: Diagnosis not present

## 2019-10-07 DIAGNOSIS — M25562 Pain in left knee: Secondary | ICD-10-CM | POA: Diagnosis not present

## 2019-10-07 DIAGNOSIS — M797 Fibromyalgia: Secondary | ICD-10-CM

## 2019-10-07 DIAGNOSIS — G894 Chronic pain syndrome: Secondary | ICD-10-CM

## 2019-10-07 DIAGNOSIS — G8929 Other chronic pain: Secondary | ICD-10-CM

## 2019-10-07 NOTE — Progress Notes (Signed)
Patient: Anna Mccarthy  Service Category: E/M  Provider: Gillis Santa, MD  DOB: 1956-03-15  DOS: 10/07/2019  Location: Office  MRN: [id number]  Setting: Ambulatory outpatient  Referring Provider: Maryland Pink, MD  Type: Established Patient  Specialty: Interventional Pain Management  PCP: Maryland Pink, MD  Location: Home  Delivery: TeleHealth     Virtual Encounter - Pain Management PROVIDER NOTE: Information contained herein reflects review and annotations entered in association with encounter. Interpretation of such information and data should be left to medically-trained personnel. Information provided to patient can be located elsewhere in the medical record under "Patient Instructions". Document created using STT-dictation technology, any transcriptional errors that may result from process are unintentional.    Contact & Pharmacy Preferred: [place]: [phone] (home) Mobile: [phone] (mobile) E-mail: [email or web address] .[id number] [address], [address] [place] [address] Phone: [phone] Fax: [phone]  [place], [place] - [address] [place] [address] Phone: [phone] Fax: [phone]   Pre-screening  Ms. Oaxaca offered "in-person" vs "virtual" encounter. She indicated preferring virtual for this encounter.   Reason COVID-19*  Social distancing based on CDC and AMA recommendations.   I contacted [name] on 10/07/2019 via video conference.      I clearly identified myself as Gillis Santa, MD. I verified that I was speaking with the correct person using two identifiers (Name: Anna Mccarthy, and date of birth: 04-Oct-1955).  Consent I sought verbal advanced consent from [name] for virtual visit interactions. I informed Anna Mccarthy of possible security and privacy concerns, risks, and  limitations associated with providing "not-in-person" medical evaluation and management services. I also informed Ms. Pereda of the availability of "in-person" appointments. Finally, I informed her that there would be a charge for the virtual visit and that she could be  personally, fully or partially, financially responsible for it. Ms. Litzinger expressed understanding and agreed to proceed.   Historic Elements   Anna Mccarthy is a 64 y.o. year old, female patient evaluated today after her last contact with our practice on Visit date not found. Ms. Erck  has a past medical history of Allergy, Anxiety, Arthritis, Bronchitis, Cancer ([place]), GERD (gastroesophageal reflux disease), and Hypertension. She also  has a past surgical history that includes Tubal ligation (1984); Abdominal hysterectomy (1996); Hernia repair (2012); Colonoscopy; Knee arthroscopy with medial menisectomy (Left, 02/18/2016); Colonoscopy with propofol (N/A, 05/30/2016); and Breast biopsy (Left, 2011). Ms. Madole has a current medication list which includes the following prescription(s): alprazolam, duloxetine, escitalopram, hydrochlorothiazide, hydroxychloroquine, omeprazole, tizanidine, alprazolam, b complex vitamins, fluocinonide, omega-3 fatty acids, tramadol, turmeric, and vitamin e. She  reports that she has never smoked. She has never used smokeless tobacco. She reports current alcohol use of about 2.0 standard drinks of alcohol per week. She reports that she does not use drugs. Ms. Yassin is allergic to percocet [oxycodone-acetaminophen] and sulfa antibiotics.   HPI  Today, she is being contacted for worsening of previously known (established) problem   Worsening left knee pain, s/p LEFT GN RFA #1 on 07/30/2018 now having return of left knee pain. States that genicular nerve RFA provided her with significant pain relief and improvement with weightbearing and range of motion for her left knee for approximately 1 year.  Discussed repeating.   Risk and benefits reviewed and patient would like  to proceed.  Laboratory Chemistry Profile   Renal Lab Results  Component Value Date   BUN 18 10/07/2014   CREATININE 0.86 10/07/2014   GFRAA >60 10/07/2014   GFRNONAA >60 10/07/2014     Hepatic Lab Results  Component Value Date   AST 24 10/07/2014   ALT 20 10/07/2014   ALBUMIN 4.4 10/07/2014   ALKPHOS 78 10/07/2014     Electrolytes Lab Results  Component Value Date   NA 135 10/07/2014   K 3.7 10/07/2014   CL 99 (L) 10/07/2014   CALCIUM 9.0 10/07/2014     Bone No results found for: VD25OH, VD125OH2TOT, PI9518AC1, YS0630ZS0, 25OHVITD1, 25OHVITD2, 25OHVITD3, TESTOFREE, TESTOSTERONE   Inflammation (CRP: Acute Phase) (ESR: Chronic Phase) No results found for: CRP, ESRSEDRATE, LATICACIDVEN     Note: Above Lab results reviewed.  Imaging  MM 3D SCREEN BREAST BILATERAL CLINICAL DATA:  Screening.  EXAM: DIGITAL SCREENING BILATERAL MAMMOGRAM WITH TOMO AND CAD  COMPARISON:  Previous exam(s).  ACR Breast Density Category c: The breast tissue is heterogeneously dense, which may obscure small masses.  FINDINGS: There are no findings suspicious for malignancy. Images were processed with CAD.  IMPRESSION: No mammographic evidence of malignancy. A result letter of this screening mammogram will be mailed directly to the patient.  RECOMMENDATION: Screening mammogram in one year. (Code:[id number]-B-01Y)  BI-RADS CATEGORY  1: Negative.  Electronically Signed   By: Fidela Salisbury M.D.   On: 08/29/2018 11:25  Assessment  The primary encounter diagnosis was Primary osteoarthritis of left knee. Diagnoses of Chronic pain of left knee, Fibromyalgia, and Chronic pain syndrome were also pertinent to this visit.  Plan of Care  Ms. [name] has a current medication list which includes the following long-term medication(s): duloxetine, escitalopram, hydrochlorothiazide, and omeprazole.  Orders:  Orders Placed This Encounter   Procedures  . Radiofrequency,Genicular    Standing Status:   Future    Standing Expiration Date:   04/07/2021    Scheduling Instructions:     Side(s): LEFT     Level(s): Superior-Lateral, Superior-Medial, and Inferior-Medial Genicular Nerve(s)     Sedation: With Sedation     Scheduling Timeframe: As soon as pre-approved    Order Specific Question:   Where will this procedure be performed?    Answer:   [facility]   Follow-up plan:   Return in about 2 weeks (around 10/21/2019) for L GN RFA.     Status post left genicular RFA 07/30/2018 which provided significant pain relief for approximately 1 year now with return of pain, plan on repeating RFA   Recent Visits No visits were found meeting these conditions.  Showing recent visits within past 90 days and meeting all other requirements   Today's Visits Date Type Provider Dept  10/07/19 Office Visit Gillis Santa, MD [facility]  Showing today's visits and meeting all other requirements   Future Appointments No visits were found meeting these conditions.  Showing future appointments within next 90 days and meeting all other requirements   I discussed the assessment and treatment plan with the patient. The patient was provided an opportunity to ask questions and all were answered. The patient agreed with the plan and demonstrated an understanding of the instructions.  Patient advised to call back or seek an in-person evaluation if the symptoms or condition worsens.  Duration of encounter: 15 minutes.  Note by: Gillis Santa, MD Date: 10/07/2019; Time: 1:37 PM

## 2019-10-28 ENCOUNTER — Ambulatory Visit: Payer: Medicare Other | Admitting: Student in an Organized Health Care Education/Training Program

## 2019-11-11 ENCOUNTER — Encounter: Payer: Self-pay | Admitting: Student in an Organized Health Care Education/Training Program

## 2019-11-11 ENCOUNTER — Ambulatory Visit
Admission: RE | Admit: 2019-11-11 | Discharge: 2019-11-11 | Disposition: A | Discharge status: Home / Self Care | Payer: Medicare Other | Source: Ambulatory Visit | Attending: Student in an Organized Health Care Education/Training Program | Admitting: Student in an Organized Health Care Education/Training Program

## 2019-11-11 ENCOUNTER — Ambulatory Visit (HOSPITAL_BASED_OUTPATIENT_CLINIC_OR_DEPARTMENT_OTHER): Payer: Medicare Other | Admitting: Student in an Organized Health Care Education/Training Program

## 2019-11-11 ENCOUNTER — Other Ambulatory Visit: Payer: Self-pay

## 2019-11-11 VITALS — BP 166/77 | HR 78 | Temp 97.9°F | Resp 16 | Ht 62.0 in | Wt 178.0 lb

## 2019-11-11 DIAGNOSIS — G894 Chronic pain syndrome: Secondary | ICD-10-CM

## 2019-11-11 DIAGNOSIS — M1712 Unilateral primary osteoarthritis, left knee: Secondary | ICD-10-CM | POA: Diagnosis not present

## 2019-11-11 DIAGNOSIS — G8929 Other chronic pain: Secondary | ICD-10-CM | POA: Diagnosis present

## 2019-11-11 DIAGNOSIS — M25562 Pain in left knee: Secondary | ICD-10-CM | POA: Insufficient documentation

## 2019-11-11 MED ORDER — FENTANYL CITRATE (PF) 100 MCG/2ML IJ SOLN
INTRAMUSCULAR | Status: AC
  Filled 2019-11-11: qty 2

## 2019-11-11 MED ORDER — LIDOCAINE HCL 2 % IJ SOLN
INTRAMUSCULAR | Status: AC
  Filled 2019-11-11: qty 20

## 2019-11-11 MED ORDER — ROPIVACAINE HCL 2 MG/ML IJ SOLN
9.0000 mL | Freq: Once | INTRAMUSCULAR | Status: AC
  Administered 2019-11-11: 10 mL via PERINEURAL

## 2019-11-11 MED ORDER — LIDOCAINE HCL 2 % IJ SOLN
20.0000 mL | Freq: Once | INTRAMUSCULAR | Status: AC
  Administered 2019-11-11: 400 mg

## 2019-11-11 MED ORDER — DEXAMETHASONE SODIUM PHOSPHATE 10 MG/ML IJ SOLN
10.0000 mg | Freq: Once | INTRAMUSCULAR | Status: AC
  Administered 2019-11-11: 10 mg

## 2019-11-11 MED ORDER — DEXAMETHASONE SODIUM PHOSPHATE 10 MG/ML IJ SOLN
INTRAMUSCULAR | Status: AC
  Filled 2019-11-11: qty 1

## 2019-11-11 MED ORDER — ROPIVACAINE HCL 2 MG/ML IJ SOLN
INTRAMUSCULAR | Status: AC
  Filled 2019-11-11: qty 10

## 2019-11-11 MED ORDER — FENTANYL CITRATE (PF) 100 MCG/2ML IJ SOLN
25.0000 ug | INTRAMUSCULAR | Status: DC | PRN
  Administered 2019-11-11: 75 ug via INTRAVENOUS

## 2019-11-11 NOTE — Progress Notes (Signed)
Patient's Name: Anna Mccarthy  MRN: [id number]  Referring Provider: Maryland Pink, MD  DOB: 1956-04-11  PCP: Maryland Pink, MD  DOS: 11/11/2019  Note by: Gillis Santa, MD  Service setting: Ambulatory outpatient  Specialty: Interventional Pain Management  Patient type: Established  Location: [facility] ([facility]  Visit type: Interventional Procedure   Primary Reason for Visit: Interventional Pain Management Treatment. CC: Knee Pain (left knee)  Procedure:          Anesthesia, Analgesia, Anxiolysis:  Type: Therapeutic Superior-lateral, Superior-medial, and Inferior-medial, Genicular Nerve Radiofrequency Ablation.  #2 (#1 done 07/30/2018)  Region: Lateral, Anterior, and Medial aspects of the knee joint, above and below the knee joint proper. Level: Superior and inferior to the knee joint. Laterality: Left  Type: Moderate (Conscious) Sedation combined with Local Anesthesia Indication(s): Analgesia and Anxiety Route: Intravenous (IV) IV Access: Secured Sedation: Meaningful verbal contact was maintained at all times during the procedure  Local Anesthetic: Lidocaine 1-2%  Position: Supine   Indications: 1. Primary osteoarthritis of left knee   2. Chronic pain of left knee   3. Chronic pain syndrome    Anna Mccarthy has been dealing with the above chronic pain for longer than three months and has either failed to respond, was unable to tolerate, or simply did not get enough benefit from other more conservative therapies including, but not limited to: 1. Over-the-counter medications 2. Anti-inflammatory medications 3. Muscle relaxants 4. Membrane stabilizers 5. Opioids 6. Physical therapy and/or chiropractic manipulation 7. Modalities (Heat, ice, etc.) 8. Invasive techniques such as nerve blocks. Anna Mccarthy has attained more than 50% relief of the pain from a series of diagnostic injections conducted in separate occasions.  Pain Score: Pre-procedure: 7 /10 Post-procedure: 0-No  pain/10  Pre-op Assessment:  Anna Mccarthy is a 64 y.o. (year old), female patient, seen today for interventional treatment. She  has a past surgical history that includes Tubal ligation (1984); Abdominal hysterectomy (1996); Hernia repair (2012); Colonoscopy; Knee arthroscopy with medial menisectomy (Left, 02/18/2016); Colonoscopy with propofol (N/A, 05/30/2016); and Breast biopsy (Left, 2011). Anna Mccarthy has a current medication list which includes the following prescription(s): alprazolam, duloxetine, escitalopram, hydrochlorothiazide, hydroxychloroquine, omeprazole, tizanidine, alprazolam, b complex vitamins, fluocinonide, omega-3 fatty acids, tramadol, turmeric, and vitamin e, and the following Facility-Administered Medications: fentanyl. Her primarily concern today is the Knee Pain (left knee)  Initial Vital Signs:  Pulse/HCG Rate: 78ECG Heart Rate: 66 Temp: 98.1 F (36.7 C) Resp: 18 BP: 138/87 SpO2: 99 %  BMI: Estimated body mass index is 32.56 kg/m as calculated from the following:   Height as of this encounter: 5\' 2"  (1.575 m).   Weight as of this encounter: 178 lb (80.7 kg).  Risk Assessment: Allergies: Reviewed. She is allergic to percocet [oxycodone-acetaminophen] and sulfa antibiotics.  Allergy Precautions: None required Coagulopathies: Reviewed. None identified.  Blood-thinner therapy: None at this time Active Infection(s): Reviewed. None identified. Anna Mccarthy is afebrile  Site Confirmation: Anna Mccarthy was asked to confirm the procedure and laterality before marking the site Procedure checklist: Completed Consent: Before the procedure and under the influence of no sedative(s), amnesic(s), or anxiolytics, the patient was informed of the treatment options, risks and possible complications. To fulfill our ethical and legal obligations, as recommended by the American Medical Association's Code of Ethics, I have informed the patient of my clinical impression; the nature and purpose of the  treatment or procedure; the risks, benefits, and possible complications of the intervention; the alternatives, including doing nothing; the risk(s) and  benefit(s) of the alternative treatment(s) or procedure(s); and the risk(s) and benefit(s) of doing nothing. The patient was provided information about the general risks and possible complications associated with the procedure. These may include, but are not limited to: failure to achieve desired goals, infection, bleeding, organ or nerve damage, allergic reactions, paralysis, and death. In addition, the patient was informed of those risks and complications associated to the procedure, such as failure to decrease pain; infection; bleeding; organ or nerve damage with subsequent damage to sensory, motor, and/or autonomic systems, resulting in permanent pain, numbness, and/or weakness of one or several areas of the body; allergic reactions; (i.e.: anaphylactic reaction); and/or death. Furthermore, the patient was informed of those risks and complications associated with the medications. These include, but are not limited to: allergic reactions (i.e.: anaphylactic or anaphylactoid reaction(s)); adrenal axis suppression; blood sugar elevation that in diabetics may result in ketoacidosis or comma; water retention that in patients with history of congestive heart failure may result in shortness of breath, pulmonary edema, and decompensation with resultant heart failure; weight gain; swelling or edema; medication-induced neural toxicity; particulate matter embolism and blood vessel occlusion with resultant organ, and/or nervous system infarction; and/or aseptic necrosis of one or more joints. Finally, the patient was informed that Medicine is not an exact science; therefore, there is also the possibility of unforeseen or unpredictable risks and/or possible complications that may result in a catastrophic outcome. The patient indicated having understood very clearly. We  have given the patient no guarantees and we have made no promises. Enough time was given to the patient to ask questions, all of which were answered to the patient's satisfaction. Ms. Kluger has indicated that she wanted to continue with the procedure. Attestation: I, the ordering provider, attest that I have discussed with the patient the benefits, risks, side-effects, alternatives, likelihood of achieving goals, and potential problems during recovery for the procedure that I have provided informed consent. Date  Time: 11/11/2019  8:55 AM  Pre-Procedure Preparation:  Monitoring: As per clinic protocol. Respiration, ETCO2, SpO2, BP, heart rate and rhythm monitor placed and checked for adequate function Safety Precautions: Patient was assessed for positional comfort and pressure points before starting the procedure. Time-out: I initiated and conducted the "Time-out" before starting the procedure, as per protocol. The patient was asked to participate by confirming the accuracy of the "Time Out" information. Verification of the correct person, site, and procedure were performed and confirmed by me, the nursing staff, and the patient. "Time-out" conducted as per Joint Commission's Universal Protocol (UP.01.01.01). Time: [id number]  Description of Procedure:          Target Area: For Genicular Nerve block(s), the targets are: the superior-lateral genicular nerve, located in the lateral distal portion of the femoral shaft as it curves to form the lateral epicondyle, in the region of the distal femoral metaphysis; the superior-medial genicular nerve, located in the medial distal portion of the femoral shaft as it curves to form the medial epicondyle; and the inferior-medial genicular nerve, located in the medial, proximal portion of the tibial shaft, as it curves to form the medial epicondyle, in the region of the proximal tibial metaphysis. Approach: Anterior, ipsilateral approach. Area Prepped: Entire knee area,  from mid-thigh to mid-shin, lateral, anterior, and medial aspects. Prepping solution: Hibiclens (4.0% Chlorhexidine gluconate solution) Safety Precautions: Aspiration looking for blood return was conducted prior to all injections. At no point did we inject any substances, as a needle was being advanced. No attempts were made  at seeking any paresthesias. Safe injection practices and needle disposal techniques used. Medications properly checked for expiration dates. SDV (single dose vial) medications used. Description of the Procedure: Protocol guidelines were followed. The patient was placed in position over the procedure table. The target area was identified and the area prepped in the usual manner. The skin and muscle were infiltrated with local anesthetic. Appropriate amount of time allowed to pass for local anesthetics to take effect. Radiofrequency needles were introduced to the target area using fluoroscopic guidance. Using the NeuroTherm NT1100 Radiofrequency Generator, sensory stimulation using 50 Hz was used to locate & identify the nerve, making sure that the needle was positioned such that there was no sensory stimulation below 0.3 V or above 0.7 V. Stimulation using 2 Hz was used to evaluate the motor component. Care was taken not to lesion any nerves that demonstrated motor stimulation of the lower extremities at an output of less than 2.5 times that of the sensory threshold, or a maximum of 2.0 V. Once satisfactory placement of the needles was achieved, the numbing solution was slowly injected after negative aspiration. After waiting for at least 2 minutes, the ablation was performed at 80 degrees C for 60 seconds, using regular Radiofrequency settings. Once the procedure was completed, the needles were then removed and the area cleansed, making sure to leave some of the prepping solution back to take advantage of its long term bactericidal properties. Intra-operative Compliance: Compliant Vitals:    11/11/19 1003 11/11/19 1013 11/11/19 1023 11/11/19 1033  BP: (!) 161/86 (!) 186/96 (!) 176/86 (!) 166/77  Pulse:      Resp: (!) 9 12 14 16   Temp:  98 F (36.7 C)  97.9 F (36.6 C)  TempSrc:      SpO2: 98% 98% 98% 97%  Weight:      Height:        Start Time: 0945 hrs. End Time: 1002 hrs. Materials & Medications:  Needle(s) Type: Teflon-coated, curved tip, Radiofrequency needle(s) Gauge: 22G Length: 10cm Medication(s): Please see orders for medications and dosing details. 5 cc solution made of 4 cc of 0.2% ropivacaine, 1 cc of Decadron 10 mg/cc.  1.5 cc injected at each level post ablation. Imaging Guidance (Non-Spinal):          Type of Imaging Technique: Fluoroscopy Guidance (Non-Spinal) Indication(s): Assistance in needle guidance and placement for procedures requiring needle placement in or near specific anatomical locations not easily accessible without such assistance. Exposure Time: Please see nurses notes. Contrast: Before injecting any contrast, we confirmed that the patient did not have an allergy to iodine, shellfish, or radiological contrast. Once satisfactory needle placement was completed at the desired level, radiological contrast was injected. Contrast injected under live fluoroscopy. No contrast complications. See chart for type and volume of contrast used. Fluoroscopic Guidance: I was personally present during the use of fluoroscopy. "Tunnel Vision Technique" used to obtain the best possible view of the target area. Parallax error corrected before commencing the procedure. "Direction-depth-direction" technique used to introduce the needle under continuous pulsed fluoroscopy. Once target was reached, antero-posterior, oblique, and lateral fluoroscopic projection used confirm needle placement in all planes. Images permanently stored in EMR. Interpretation: I personally interpreted the imaging intraoperatively. Adequate needle placement confirmed in multiple planes.  Appropriate spread of contrast into desired area was observed. No evidence of afferent or efferent intravascular uptake. Permanent images saved into the patient's record.  Antibiotic Prophylaxis:   Anti-infectives (From admission, onward)   None  Indication(s): None identified  Post-operative Assessment:  Post-procedure Vital Signs:  Pulse/HCG Rate: 7865 Temp: 97.9 F (36.6 C) Resp: 16 BP: (!) 166/77 SpO2: 97 %  EBL: None  Complications: No immediate post-treatment complications observed by team, or reported by patient.  Note: The patient tolerated the entire procedure well. A repeat set of vitals were taken after the procedure and the patient was kept under observation following institutional policy, for this type of procedure. Post-procedural neurological assessment was performed, showing return to baseline, prior to discharge. The patient was provided with post-procedure discharge instructions, including a section on how to identify potential problems. Should any problems arise concerning this procedure, the patient was given instructions to immediately contact us, at any time, without hesitation. In any case, we plan to contact the patient by telephone for a follow-up status report regarding this interventional procedure.  Comments:  No additional relevant information.  Plan of Care    Imaging Orders     DG PAIN CLINIC C-ARM 1-60 MIN NO REPORT Procedure Orders    No procedure(s) ordered today    Medications ordered for procedure: Meds ordered this encounter  Medications  . lidocaine (XYLOCAINE) 2 % (with pres) injection 400 mg  . fentaNYL (SUBLIMAZE) injection 25-50 mcg    Make sure Narcan is available in the pyxis when using this medication. In the event of respiratory depression (RR< 8/min): Titrate NARCAN (naloxone) in increments of 0.1 to 0.2 mg IV at 2-3 minute intervals, until desired degree of reversal.  . ropivacaine (PF) 2 mg/mL (0.2%) (NAROPIN) injection 9  mL  . dexamethasone (DECADRON) injection 10 mg   Medications administered: We administered lidocaine, fentaNYL, ropivacaine (PF) 2 mg/mL (0.2%), and dexamethasone.  See the medical record for exact dosing, route, and time of administration.  Disposition: Discharge home  Discharge Date & Time: 11/11/2019; 1033 hrs.   Physician-requested Follow-up: Return in about 8 weeks (around 01/06/2020) for Post Procedure Evaluation, in person.  Future Appointments  Date Time Provider [place]  01/06/2020  1:45 PM Gillis Santa, MD [facility] None   Primary Care Physician: Maryland Pink, MD Location: [facility] Note by: Gillis Santa, MD Date: 11/11/2019; Time: 11:32 AM  Disclaimer:  Medicine is not an exact science. The only guarantee in medicine is that nothing is guaranteed. It is important to note that the decision to proceed with this intervention was based on the information collected from the patient. The Data and conclusions were drawn from the patient's questionnaire, the interview, and the physical examination. Because the information was provided in large part by the patient, it cannot be guaranteed that it has not been purposely or unconsciously manipulated. Every effort has been made to obtain as much relevant data as possible for this evaluation. It is important to note that the conclusions that lead to this procedure are derived in large part from the available data. Always take into account that the treatment will also be dependent on availability of resources and existing treatment guidelines, considered by other Pain Management Practitioners as being common knowledge and practice, at the time of the intervention. For Medico-Legal purposes, it is also important to point out that variation in procedural techniques and pharmacological choices are the acceptable norm. The indications, contraindications, technique, and results of the above procedure should only  be interpreted and judged by a Board-Certified Interventional Pain Specialist with extensive familiarity and expertise in the same exact procedure and technique.

## 2019-11-11 NOTE — Patient Instructions (Signed)

## 2019-11-12 ENCOUNTER — Telehealth: Payer: Self-pay | Admitting: *Deleted

## 2019-11-12 ENCOUNTER — Other Ambulatory Visit: Payer: Self-pay | Admitting: Student in an Organized Health Care Education/Training Program

## 2019-11-12 MED ORDER — DICLOFENAC SODIUM 1 % EX GEL: 4.0000 g | Freq: Four times a day (QID) | CUTANEOUS | 2 refills | Status: AC

## 2019-11-12 NOTE — Telephone Encounter (Signed)
No problems post procedure. 

## 2019-11-12 NOTE — Progress Notes (Signed)
Attempted to call patient, message left. 

## 2020-01-06 ENCOUNTER — Ambulatory Visit: Payer: Medicare Other | Admitting: Student in an Organized Health Care Education/Training Program

## 2020-01-30 ENCOUNTER — Ambulatory Visit (HOSPITAL_BASED_OUTPATIENT_CLINIC_OR_DEPARTMENT_OTHER): Payer: Medicare Other | Admitting: Student in an Organized Health Care Education/Training Program

## 2020-01-30 ENCOUNTER — Ambulatory Visit
Admission: RE | Admit: 2020-01-30 | Discharge: 2020-01-30 | Disposition: A | Discharge status: Home / Self Care | Payer: Medicare Other | Source: Ambulatory Visit | Attending: Student in an Organized Health Care Education/Training Program | Admitting: Student in an Organized Health Care Education/Training Program

## 2020-01-30 ENCOUNTER — Ambulatory Visit
Admission: RE | Admit: 2020-01-30 | Discharge: 2020-01-30 | Disposition: A | Discharge status: Home / Self Care | Payer: Medicare Other | Attending: Student in an Organized Health Care Education/Training Program | Admitting: Student in an Organized Health Care Education/Training Program

## 2020-01-30 ENCOUNTER — Other Ambulatory Visit: Payer: Self-pay

## 2020-01-30 ENCOUNTER — Encounter: Payer: Self-pay | Admitting: Student in an Organized Health Care Education/Training Program

## 2020-01-30 VITALS — BP 137/103 | HR 79 | Temp 97.5°F | Resp 17 | Ht 62.0 in | Wt 170.0 lb

## 2020-01-30 DIAGNOSIS — G8929 Other chronic pain: Secondary | ICD-10-CM

## 2020-01-30 DIAGNOSIS — M1711 Unilateral primary osteoarthritis, right knee: Secondary | ICD-10-CM | POA: Insufficient documentation

## 2020-01-30 DIAGNOSIS — G894 Chronic pain syndrome: Secondary | ICD-10-CM

## 2020-01-30 DIAGNOSIS — M1712 Unilateral primary osteoarthritis, left knee: Secondary | ICD-10-CM | POA: Insufficient documentation

## 2020-01-30 DIAGNOSIS — M25562 Pain in left knee: Secondary | ICD-10-CM

## 2020-01-30 DIAGNOSIS — M25561 Pain in right knee: Secondary | ICD-10-CM | POA: Insufficient documentation

## 2020-01-30 DIAGNOSIS — M23307 Other meniscus derangements, unspecified meniscus, left knee: Secondary | ICD-10-CM

## 2020-01-30 DIAGNOSIS — M797 Fibromyalgia: Secondary | ICD-10-CM

## 2020-01-30 NOTE — Assessment & Plan Note (Signed)
obtain right knee x-ray and we discussed intra-articular right knee Hyalgan injection.  Risks and benefits reviewed and patient like to proceed.  Future considerations would include right knee genicular nerve block and possible radiofrequency ablation.

## 2020-01-30 NOTE — Progress Notes (Signed)
PROVIDER NOTE: Information contained herein reflects review and annotations entered in association with encounter. Interpretation of such information and data should be left to medically-trained personnel. Information provided to patient can be located elsewhere in the medical record under "Patient Instructions". Document created using STT-dictation technology, any transcriptional errors that may result from process are unintentional.    Patient: Anna Mccarthy  Service Category: E/M  Provider: Gillis Santa, MD  DOB: February 02, 1956  DOS: 01/30/2020  Specialty: Interventional Pain Management  MRN: [id number]  Setting: Ambulatory outpatient  PCP: Maryland Pink, MD  Type: Established Patient    Referring Provider: Maryland Pink, MD  Location: Office  Delivery: Face-to-face     HPI  Reason for encounter: Ms. Anna Mccarthy, a 64 y.o. year old female, is here today for evaluation and management of her Arthritis of right knee [M17.11]. Ms. Anna Mccarthy primary complain today is Knee Pain (left) Last encounter: Practice (01/06/2020). My last encounter with her was on 01/06/2020. Pertinent problems: Ms. Ewing has DJD (degenerative joint disease); Chronic pain of left knee; Meniscus degeneration, left; and Chronic pain syndrome on their pertinent problem list. Pain Assessment: Severity of Chronic pain is reported as a 1 /10. Location: Knee Left/up into left hip. Onset: More than a month ago. Quality: Aching, Stabbing. Timing: Intermittent. Modifying factor(s): ice, elevation. Vitals:  height is '5\' 2"'$  (1.575 m) and weight is 170 lb (77.1 kg). Her temporal temperature is 97.5 F (36.4 C) (abnormal). Her blood pressure is 137/103 (abnormal) and her pulse is 79. Her respiration is 17 and oxygen saturation is 100%.   Patient presents today for postprocedural evaluation after left knee genicular nerve radiofrequency ablation performed on 11/11/2019.  Patient endorses pain relief of her left knee.  She endorses pain relief with  weightbearing although she does have pain when she is walking down stairs and getting in and out of her car.  I encouraged her to utilize a knee brace to help stabilize her knee and keep it in line.  Patient does utilize ice.  She also gets in her hot tub which she states helps.  She is now endorsing pain of her right knee.  She states that she has been unable to participate in aquatic therapy and go to the gym because of Covid.  She is interested in interventional options for her right knee pain.  I will obtain right knee x-ray and we discussed intra-articular right knee Hyalgan injection.  Risks and benefits reviewed and patient like to proceed.  Future considerations would include right knee genicular nerve block and possible radiofrequency ablation.  Post-Procedure Evaluation  Procedure 11/11/19  Type: Therapeutic Superior-lateral, Superior-medial, and Inferior-medial, Genicular Nerve Radiofrequency Ablation.  #2 (#1 done 07/30/2018)  Region: Lateral, Anterior, and Medial aspects of the knee joint, above and below the knee joint proper. Level: Superior and inferior to the knee joint. Laterality: Left  Sedation: Please see nurses note.  Effectiveness during initial hour after procedure(Ultra-Short Term Relief): 100 %   Local anesthetic used: Long-acting (4-6 hours) Effectiveness: Defined as any analgesic benefit obtained secondary to the administration of local anesthetics. This carries significant diagnostic value as to the etiological location, or anatomical origin, of the pain. Duration of benefit is expected to coincide with the duration of the local anesthetic used.  Effectiveness during initial 4-6 hours after procedure(Short-Term Relief): 100 %  Long-term benefit: Defined as any relief past the pharmacologic duration of the local anesthetics.  Effectiveness past the initial 6 hours after procedure(Long-Term Relief): 75 %  Current benefits: Defined as benefit that persist at this time.    Analgesia:  >75% relief Function: Ms. Anna Mccarthy reports improvement in function ROM: Ms. Anna Mccarthy reports improvement in ROM  ROS  Constitutional: Denies any fever or chills Gastrointestinal: No reported hemesis, hematochezia, vomiting, or acute GI distress Musculoskeletal: + left knee pain and right knee pain, right greater than left Neurological: No reported episodes of acute onset apraxia, aphasia, dysarthria, agnosia, amnesia, paralysis, loss of coordination, or loss of consciousness  Medication Review  ALPRAZolam, DULoxetine, Omega-3 Fatty Acids, Turmeric, b complex vitamins, diclofenac Sodium, escitalopram, fluocinonide, hydrochlorothiazide, hydroxychloroquine, omeprazole, tiZANidine, traMADol, and vitamin E  History Review  Allergy: Ms. Anna Mccarthy is allergic to percocet [oxycodone-acetaminophen] and sulfa antibiotics. Drug: Ms. Anna Mccarthy  reports no history of drug use. Alcohol:  reports current alcohol use of about 2.0 standard drinks of alcohol per week. Tobacco:  reports that she has never smoked. She has never used smokeless tobacco. Social: Ms. Anna Mccarthy  reports that she has never smoked. She has never used smokeless tobacco. She reports current alcohol use of about 2.0 standard drinks of alcohol per week. She reports that she does not use drugs. Medical:  has a past medical history of Allergy, Anxiety, Arthritis, Bronchitis, Cancer ([place]), GERD (gastroesophageal reflux disease), and Hypertension. Surgical: Ms. Anna Mccarthy  has a past surgical history that includes Tubal ligation (1984); Abdominal hysterectomy (1996); Hernia repair (2012); Colonoscopy; Knee arthroscopy with medial menisectomy (Left, 02/18/2016); Colonoscopy with propofol (N/A, 05/30/2016); and Breast biopsy (Left, 2011). Family: family history includes Breast cancer in her cousin and maternal aunt; Ovarian cancer in her sister.  Laboratory Chemistry Profile   Renal Lab Results  Component Value Date   BUN 18 10/07/2014   CREATININE  0.86 10/07/2014   GFRAA >60 10/07/2014   GFRNONAA >60 10/07/2014     Hepatic Lab Results  Component Value Date   AST 24 10/07/2014   ALT 20 10/07/2014   ALBUMIN 4.4 10/07/2014   ALKPHOS 78 10/07/2014     Electrolytes Lab Results  Component Value Date   NA 135 10/07/2014   K 3.7 10/07/2014   CL 99 (L) 10/07/2014   CALCIUM 9.0 10/07/2014     Bone No results found for: VD25OH, VD125OH2TOT, HX5056PV9, YI0165VV7, 25OHVITD1, 25OHVITD2, 25OHVITD3, TESTOFREE, TESTOSTERONE   Inflammation (CRP: Acute Phase) (ESR: Chronic Phase) No results found for: CRP, ESRSEDRATE, LATICACIDVEN     Note: Above Lab results reviewed.  Recent Imaging Review  DG PAIN CLIN[facility] C-ARM 1-60 MIN NO REPORT Fluoro was used, but no Radiologist interpretation will be provided.  Please refer to "NOTES" tab for provider progress note. Note: Reviewed        Physical Exam  General appearance: Well nourished, well developed, and well hydrated. In no apparent acute distress Mental status: Alert, oriented x 3 (person, place, & time)       Respiratory: No evidence of acute respiratory distress Eyes: PERLA Vitals: BP (!) 137/103   Pulse 79   Temp (!) 97.5 F (36.4 C) (Temporal)   Resp 17   Ht _0  (1.575 m)   Wt 170 lb (77.1 kg)   SpO2 100%   BMI 31.09 kg/m  BMI: Estimated body mass index is 31.09 kg/m as calculated from the following:   Height as of this encounter: _1  (1.575 m).   Weight as of this encounter: 170 lb (77.1 kg). Ideal: Ideal body weight: 50.1 kg (110 lb 7.2 oz) Adjusted ideal body weight: 60.9 kg (134 lb 4.3 oz)  Lower Extremity Exam    Side: Right lower extremity  Side: Left lower extremity  Stability: No instability observed          Stability: No instability observed          Skin & Extremity Inspection: Skin color, temperature, and hair growth are WNL. No peripheral edema or cyanosis. No masses, redness, swelling, asymmetry, or associated skin lesions. No contractures.  Skin &  Extremity Inspection: Skin color, temperature, and hair growth are WNL. No peripheral edema or cyanosis. No masses, redness, swelling, asymmetry, or associated skin lesions. No contractures.  Functional ROM: Pain restricted ROM for knee joint          Functional ROM: Unrestricted ROM                  Muscle Tone/Strength: Functionally intact. No obvious neuro-muscular anomalies detected.  Muscle Tone/Strength: Functionally intact. No obvious neuro-muscular anomalies detected.  Sensory (Neurological): Arthropathic arthralgia        Sensory (Neurological): Arthropathic arthralgia        DTR: Patellar: deferred today Achilles: deferred today Plantar: deferred today  DTR: Patellar: deferred today Achilles: deferred today Plantar: deferred today  Palpation: No palpable anomalies  Palpation: No palpable anomalies    Assessment   Status Diagnosis  Worsening Worsening Controlled 1. Arthritis of right knee   2. Primary osteoarthritis of left knee   3. Chronic pain of left knee   4. Fibromyalgia   5. Meniscus degeneration, left   6. Chronic pain syndrome   7. Chronic pain of right knee      Updated Problems: Problem  Chronic Pain of Left Knee  Meniscus Degeneration, Left   Status post left genicular RFA 07/30/2018, 11/11/2019 which provided significant pain relief.    Chronic Pain Syndrome  Djd (Degenerative Joint Disease)  Arthritis of Right Knee   Has tried and failed NSAIDs, physical therapy, aquatic therapy.     Plan of Care  Problem-specific:  Arthritis of right knee obtain right knee x-ray and we discussed intra-articular right knee Hyalgan injection.  Risks and benefits reviewed and patient like to proceed.  Future considerations would include right knee genicular nerve block and possible radiofrequency ablation.  Orders:  Orders Placed This Encounter  Procedures  . KNEE INJECTION    Hyalgan knee injection. Please order Hyalgan.    Standing Status:   Future    Standing  Expiration Date:   03/01/2020    Scheduling Instructions:     Procedure: Intra-articular Hyalgan Knee injection            Side: RIGHT     Sedation: None     Timeframe: in two (2) weeks    Order Specific Question:   Where will this procedure be performed?    Answer:   [facility]  . DG Knee 1-2 Views Right    Standing Status:   Future    Number of Occurrences:   1    Standing Expiration Date:   01/29/2021    Order Specific Question:   Reason for Exam (SYMPTOM  OR DIAGNOSIS REQUIRED)    Answer:   Right knee pain/arthralgia    Order Specific Question:   Preferred imaging location?    Answer:   [facility]    Order Specific Question:   Call Results- Best Contact Number?    Answer:   [phone] (Pain Clinic facility) (Dr. Dossie Arbour)   Follow-up plan:   Return in about 1 week (around 02/06/2020) for  right knee hyalgan #1.     Status post left genicular RFA 07/30/2018 which provided significant pain relief for approximately 1 year now, repeat on 11/11/2019: Therapeutic.    Recent Visits Date Type Provider Dept  11/11/19 Procedure visit Gillis Santa, MD [facility]  Showing recent visits within past 90 days and meeting all other requirements Today's Visits Date Type Provider Dept  01/30/20 Office Visit Gillis Santa, MD [facility]  Showing today's visits and meeting all other requirements Future Appointments Date Type Provider Dept  02/03/20 Appointment Gillis Santa, MD [facility]  Showing future appointments within next 90 days and meeting all other requirements  I discussed the assessment and treatment plan with the patient. The patient was provided an opportunity to ask questions and all were answered. The patient agreed with the plan and demonstrated an understanding of the instructions.  Patient advised to call back or seek an in-person evaluation if the symptoms or condition worsens.  Duration of encounter:30 minutes.  Note by: Gillis Santa, MD Date: 01/30/2020; Time: 9:46 AM

## 2020-02-03 ENCOUNTER — Ambulatory Visit
Payer: Medicare Other | Attending: Student in an Organized Health Care Education/Training Program | Admitting: Student in an Organized Health Care Education/Training Program

## 2020-02-03 ENCOUNTER — Encounter: Payer: Self-pay | Admitting: Student in an Organized Health Care Education/Training Program

## 2020-02-03 VITALS — BP 119/83 | HR 85 | Temp 97.2°F | Resp 16 | Ht 62.0 in | Wt 170.0 lb

## 2020-02-03 DIAGNOSIS — M1711 Unilateral primary osteoarthritis, right knee: Secondary | ICD-10-CM | POA: Diagnosis not present

## 2020-02-03 DIAGNOSIS — M25561 Pain in right knee: Secondary | ICD-10-CM | POA: Diagnosis not present

## 2020-02-03 DIAGNOSIS — M1712 Unilateral primary osteoarthritis, left knee: Secondary | ICD-10-CM | POA: Diagnosis not present

## 2020-02-03 DIAGNOSIS — G8929 Other chronic pain: Secondary | ICD-10-CM | POA: Insufficient documentation

## 2020-02-03 MED ORDER — LIDOCAINE HCL 2 % IJ SOLN
20.0000 mL | Freq: Once | INTRAMUSCULAR | Status: AC
  Administered 2020-02-03: 200 mg

## 2020-02-03 MED ORDER — SODIUM HYALURONATE (VISCOSUP) 20 MG/2ML IX SOSY
2.0000 mL | PREFILLED_SYRINGE | Freq: Once | INTRA_ARTICULAR | Status: AC
  Administered 2020-02-03: 2 mL via INTRA_ARTICULAR

## 2020-02-03 MED ORDER — LIDOCAINE HCL (PF) 2 % IJ SOLN
INTRAMUSCULAR | Status: AC
  Filled 2020-02-03: qty 10

## 2020-02-03 NOTE — Progress Notes (Signed)
PROVIDER NOTE: Information contained herein reflects review and annotations entered in association with encounter. Interpretation of such information and data should be left to medically-trained personnel. Information provided to patient can be located elsewhere in the medical record under "Patient Instructions". Document created using STT-dictation technology, any transcriptional errors that may result from process are unintentional.    Patient: Anna Mccarthy  Service Category: Procedure  Provider: Gillis Santa, MD  DOB: 09/11/1955  DOS: 02/03/2020  Location: [facility]  MRN: [id number]  Setting: Ambulatory - outpatient  Referring Provider: Maryland Pink, MD  Type: Established Patient  Specialty: Interventional Pain Management  PCP: Maryland Pink, MD   Primary Reason for Visit: Interventional Pain Management Treatment. CC: Right knee pain  Procedure:          Anesthesia, Analgesia, Anxiolysis:  Type: Diagnostic Intra-Articular Hyalgan Knee Injection #1  Region: Medial infrapatellar Knee Region Level: Knee Joint Laterality: Right knee  Type: Local Anesthesia Indication(s): Analgesia         Local Anesthetic: Lidocaine 1-2% Route: Infiltration ([place]/IM) IV Access: Declined Sedation: Declined   Position: Sitting   Indications: 1. Arthritis of right knee   2. Primary osteoarthritis of left knee   3. Chronic pain of right knee    Pain Score: Pre-procedure: 3 /10 Post-procedure: 0-No pain/10   Pre-op Assessment:  Anna Mccarthy is a 64 y.o. (year old), female patient, seen today for interventional treatment. She  has a past surgical history that includes Tubal ligation (1984); Abdominal hysterectomy (1996); Hernia repair (2012); Colonoscopy; Knee arthroscopy with medial menisectomy (Left, 02/18/2016); Colonoscopy with propofol (N/A, 05/30/2016); and Breast biopsy (Left, 2011). Anna Mccarthy has a current medication list which includes the following prescription(s): alprazolam,  diclofenac sodium, duloxetine, escitalopram, fluocinonide, hydrochlorothiazide, hydroxychloroquine, omeprazole, tizanidine, alprazolam, b complex vitamins, omega-3 fatty acids, tramadol, turmeric, and vitamin e. Her primarily concern today is the No chief complaint on file.  Initial Vital Signs:  Pulse/HCG Rate: 85  Temp: (!) 97.2 F (36.2 C) Resp: 16 BP: 119/83 SpO2: 98 %  BMI: Estimated body mass index is 31.09 kg/m as calculated from the following:   Height as of this encounter: 5\' 2"  (1.575 m).   Weight as of this encounter: 170 lb (77.1 kg).  Risk Assessment: Allergies: Reviewed. She is allergic to percocet [oxycodone-acetaminophen] and sulfa antibiotics.  Allergy Precautions: None required Coagulopathies: Reviewed. None identified.  Blood-thinner therapy: None at this time Active Infection(s): Reviewed. None identified. Anna Mccarthy is afebrile  Site Confirmation: Anna Mccarthy was asked to confirm the procedure and laterality before marking the site Procedure checklist: Completed Consent: Before the procedure and under the influence of no sedative(s), amnesic(s), or anxiolytics, the patient was informed of the treatment options, risks and possible complications. To fulfill our ethical and legal obligations, as recommended by the American Medical Association's Code of Ethics, I have informed the patient of my clinical impression; the nature and purpose of the treatment or procedure; the risks, benefits, and possible complications of the intervention; the alternatives, including doing nothing; the risk(s) and benefit(s) of the alternative treatment(s) or procedure(s); and the risk(s) and benefit(s) of doing nothing. The patient was provided information about the general risks and possible complications associated with the procedure. These may include, but are not limited to: failure to achieve desired goals, infection, bleeding, organ or nerve damage, allergic reactions, paralysis, and  death. In addition, the patient was informed of those risks and complications associated to the procedure, such as failure to decrease pain; infection;  bleeding; organ or nerve damage with subsequent damage to sensory, motor, and/or autonomic systems, resulting in permanent pain, numbness, and/or weakness of one or several areas of the body; allergic reactions; (i.e.: anaphylactic reaction); and/or death. Furthermore, the patient was informed of those risks and complications associated with the medications. These include, but are not limited to: allergic reactions (i.e.: anaphylactic or anaphylactoid reaction(s)); adrenal axis suppression; blood sugar elevation that in diabetics may result in ketoacidosis or comma; water retention that in patients with history of congestive heart failure may result in shortness of breath, pulmonary edema, and decompensation with resultant heart failure; weight gain; swelling or edema; medication-induced neural toxicity; particulate matter embolism and blood vessel occlusion with resultant organ, and/or nervous system infarction; and/or aseptic necrosis of one or more joints. Finally, the patient was informed that Medicine is not an exact science; therefore, there is also the possibility of unforeseen or unpredictable risks and/or possible complications that may result in a catastrophic outcome. The patient indicated having understood very clearly. We have given the patient no guarantees and we have made no promises. Enough time was given to the patient to ask questions, all of which were answered to the patient's satisfaction. Anna Mccarthy has indicated that she wanted to continue with the procedure. Attestation: I, the ordering provider, attest that I have discussed with the patient the benefits, risks, side-effects, alternatives, likelihood of achieving goals, and potential problems during recovery for the procedure that I have provided informed consent. Date   Time: 02/03/2020   9:27 AM  Pre-Procedure Preparation:  Monitoring: As per clinic protocol. Respiration, ETCO2, SpO2, BP, heart rate and rhythm monitor placed and checked for adequate function Safety Precautions: Patient was assessed for positional comfort and pressure points before starting the procedure. Time-out: I initiated and conducted the "Time-out" before starting the procedure, as per protocol. The patient was asked to participate by confirming the accuracy of the "Time Out" information. Verification of the correct person, site, and procedure were performed and confirmed by me, the nursing staff, and the patient. "Time-out" conducted as per Joint Commission's Universal Protocol (UP.01.01.01). Time: 0946  Description of Procedure:          Target Area: Knee Joint Approach: Just above the Lateral tibial plateau, lateral to the infrapatellar tendon. Area Prepped: Entire knee area, from the mid-thigh to the mid-shin. DuraPrep (Iodine Povacrylex [0.7% available iodine] and Isopropyl Alcohol, 74% w/w) Safety Precautions: Aspiration looking for blood return was conducted prior to all injections. At no point did we inject any substances, as a needle was being advanced. No attempts were made at seeking any paresthesias. Safe injection practices and needle disposal techniques used. Medications properly checked for expiration dates. SDV (single dose vial) medications used. Description of the Procedure: Protocol guidelines were followed. The patient was placed in position over the fluoroscopy table. The target area was identified and the area prepped in the usual manner. Skin & deeper tissues infiltrated with local anesthetic. Appropriate amount of time allowed to pass for local anesthetics to take effect. The procedure needles were then advanced to the target area. Proper needle placement secured. Negative aspiration confirmed. Solution injected in intermittent fashion, asking for systemic symptoms every 0.5cc of  injectate. The needles were then removed and the area cleansed, making sure to leave some of the prepping solution back to take advantage of its long term bactericidal properties. Vitals:   02/03/20 0927  BP: 119/83  Pulse: 85  Resp: 16  Temp: (!) 97.2 F (36.2 C)  TempSrc: Temporal  SpO2: 98%  Weight: 170 lb (77.1 kg)  Height: 5\' 2"  (1.575 m)    Start Time: 0946 hrs. End Time: 0950 hrs. Materials:  Needle(s) Type: Regular needle Gauge: 25G Length: 1.5-in Medication(s): Please see orders for medications and dosing details.  Imaging Guidance:          Type of Imaging Technique: None used Indication(s): N/A Exposure Time: No patient exposure Contrast: None used. Fluoroscopic Guidance: N/A Ultrasound Guidance: N/A Interpretation: N/A  Antibiotic Prophylaxis:   Anti-infectives (From admission, onward)   None     Indication(s): None identified  Post-operative Assessment:  Post-procedure Vital Signs:  Pulse/HCG Rate: 85  Temp: (!) 97.2 F (36.2 C) Resp: 16 BP: 119/83 SpO2: 98 %  EBL: None  Complications: No immediate post-treatment complications observed by team, or reported by patient.  Note: The patient tolerated the entire procedure well. A repeat set of vitals were taken after the procedure and the patient was kept under observation following institutional policy, for this type of procedure. Post-procedural neurological assessment was performed, showing return to baseline, prior to discharge. The patient was provided with post-procedure discharge instructions, including a section on how to identify potential problems. Should any problems arise concerning this procedure, the patient was given instructions to immediately contact us, at any time, without hesitation. In any case, we plan to contact the patient by telephone for a follow-up status report regarding this interventional procedure.  Comments:  No additional relevant information.  Plan of Care  Orders:   Orders Placed This Encounter  Procedures   KNEE INJECTION    Hyalgan knee injection. Please order Hyalgan.    Standing Status:   Future    Standing Expiration Date:   03/05/2020    Scheduling Instructions:     Procedure: Intra-articular Hyalgan Knee injection            Side: RIGHT     Sedation: None     Timeframe: in 3 weeks    Order Specific Question:   Where will this procedure be performed?    Answer:   [facility]    Medications ordered for procedure: Meds ordered this encounter  Medications   lidocaine (XYLOCAINE) 2 % (with pres) injection 400 mg   Sodium Hyaluronate SOSY 2 mL   Follow-up plan:   Return in about 4 weeks (around 03/02/2020) for Right knee Hyalgan injection #2.      Status post left genicular RFA 07/30/2018 which provided significant pain relief for approximately 1 year now, repeat on 11/11/2019: Therapeutic.  Right knee Hyalgan injection #1 on 02/03/2020   Recent Visits Date Type Provider Dept  01/30/20 Office Visit Gillis Santa, MD [facility]  11/11/19 Procedure visit Gillis Santa, MD [facility]  Showing recent visits within past 90 days and meeting all other requirements Today's Visits Date Type Provider Dept  02/03/20 Procedure visit Gillis Santa, MD [facility]  Showing today's visits and meeting all other requirements Future Appointments Date Type Provider Dept  02/24/20 Appointment Gillis Santa, MD [facility]  Showing future appointments within next 90 days and meeting all other requirements  Disposition: Discharge home  Discharge (Date   Time): 02/03/2020; 0951 hrs.   Primary Care Physician: Maryland Pink, MD Location: [facility] Note by: Gillis Santa, MD Date: 02/03/2020; Time: 11:44 AM  Disclaimer:  Medicine is not an exact science. The only guarantee in medicine is that nothing is guaranteed. It is important to note  that the decision to proceed with this  intervention was based on the information collected from the patient. The Data and conclusions were drawn from the patient's questionnaire, the interview, and the physical examination. Because the information was provided in large part by the patient, it cannot be guaranteed that it has not been purposely or unconsciously manipulated. Every effort has been made to obtain as much relevant data as possible for this evaluation. It is important to note that the conclusions that lead to this procedure are derived in large part from the available data. Always take into account that the treatment will also be dependent on availability of resources and existing treatment guidelines, considered by other Pain Management Practitioners as being common knowledge and practice, at the time of the intervention. For Medico-Legal purposes, it is also important to point out that variation in procedural techniques and pharmacological choices are the acceptable norm. The indications, contraindications, technique, and results of the above procedure should only be interpreted and judged by a Board-Certified Interventional Pain Specialist with extensive familiarity and expertise in the same exact procedure and technique.

## 2020-02-03 NOTE — Progress Notes (Signed)
Safety precautions to be maintained throughout the outpatient stay will include: orient to surroundings, keep bed in low position, maintain call bell within reach at all times, provide assistance with transfer out of bed and ambulation.  

## 2020-02-04 ENCOUNTER — Telehealth: Payer: Self-pay

## 2020-02-04 NOTE — Telephone Encounter (Signed)
Post procedure phone call.  Patients husband states she is resting but is doing good.  Instructed to call us back for any questions or concerns.

## 2020-02-24 ENCOUNTER — Encounter: Payer: Self-pay | Admitting: Student in an Organized Health Care Education/Training Program

## 2020-02-24 ENCOUNTER — Ambulatory Visit
Payer: Medicare Other | Attending: Student in an Organized Health Care Education/Training Program | Admitting: Student in an Organized Health Care Education/Training Program

## 2020-02-24 ENCOUNTER — Other Ambulatory Visit: Payer: Self-pay

## 2020-02-24 VITALS — BP 139/97 | HR 69 | Temp 96.6°F | Resp 16 | Ht 62.0 in | Wt 165.0 lb

## 2020-02-24 DIAGNOSIS — M25561 Pain in right knee: Secondary | ICD-10-CM | POA: Diagnosis not present

## 2020-02-24 DIAGNOSIS — G894 Chronic pain syndrome: Secondary | ICD-10-CM

## 2020-02-24 DIAGNOSIS — G8929 Other chronic pain: Secondary | ICD-10-CM | POA: Diagnosis not present

## 2020-02-24 DIAGNOSIS — M1711 Unilateral primary osteoarthritis, right knee: Secondary | ICD-10-CM | POA: Diagnosis not present

## 2020-02-24 MED ORDER — SODIUM HYALURONATE (VISCOSUP) 20 MG/2ML IX SOSY
2.0000 mL | PREFILLED_SYRINGE | Freq: Once | INTRA_ARTICULAR | Status: AC
  Administered 2020-02-24: 2 mL via INTRA_ARTICULAR

## 2020-02-24 MED ORDER — LIDOCAINE HCL 2 % IJ SOLN
20.0000 mL | Freq: Once | INTRAMUSCULAR | Status: AC
  Administered 2020-02-24: 200 mg

## 2020-02-24 MED ORDER — LIDOCAINE HCL (PF) 2 % IJ SOLN
INTRAMUSCULAR | Status: AC
  Filled 2020-02-24: qty 10

## 2020-02-24 NOTE — Progress Notes (Signed)
PROVIDER NOTE: Information contained herein reflects review and annotations entered in association with encounter. Interpretation of such information and data should be left to medically-trained personnel. Information provided to patient can be located elsewhere in the medical record under "Patient Instructions". Document created using STT-dictation technology, any transcriptional errors that may result from process are unintentional.    Patient: Anna Mccarthy  Service Category: Procedure  Provider: Gillis Santa, MD  DOB: 21-Dec-1955  DOS: 02/24/2020  Location: [facility]  MRN: [id number]  Setting: Ambulatory - outpatient  Referring Provider: Maryland Pink, MD  Type: Established Patient  Specialty: Interventional Pain Management  PCP: Maryland Pink, MD   Primary Reason for Visit: Interventional Pain Management Treatment. CC: Right knee pain  Procedure:          Anesthesia, Analgesia, Anxiolysis:  Type: Diagnostic Intra-Articular Hyalgan Knee Injection #2  Region: Medial infrapatellar Knee Region Level: Knee Joint Laterality: Right knee  Type: Local Anesthesia Indication(s): Analgesia         Local Anesthetic: Lidocaine 1-2% Route: Infiltration ([place]/IM) IV Access: Declined Sedation: Declined   Position: Sitting   Indications: 1. Arthritis of right knee   2. Chronic pain of right knee   3. Chronic pain syndrome    Pain Score: Pre-procedure: 2 /10 Post-procedure: 2 /10   Pre-op Assessment:  Anna Mccarthy is a 64 y.o. (year old), female patient, seen today for interventional treatment. She  has a past surgical history that includes Tubal ligation (1984); Abdominal hysterectomy (1996); Hernia repair (2012); Colonoscopy; Knee arthroscopy with medial menisectomy (Left, 02/18/2016); Colonoscopy with propofol (N/A, 05/30/2016); and Breast biopsy (Left, 2011). Ms. Chahal has a current medication list which includes the following prescription(s): alprazolam, duloxetine,  ergocalciferol, escitalopram, fluocinonide, gabapentin, hydrochlorothiazide, hydroxychloroquine, omeprazole, tizanidine, alprazolam, b complex vitamins, omega-3 fatty acids, tramadol, turmeric, and vitamin e. Her primarily concern today is the Knee Pain (right )  Initial Vital Signs:  Pulse/HCG Rate: 69  Temp: (!) 96.6 F (35.9 C) Resp: 16 BP: (!) 139/97 SpO2: 98 %  BMI: Estimated body mass index is 30.18 kg/m as calculated from the following:   Height as of this encounter: 5\' 2"  (1.575 m).   Weight as of this encounter: 165 lb (74.8 kg).  Risk Assessment: Allergies: Reviewed. She is allergic to percocet [oxycodone-acetaminophen] and sulfa antibiotics.  Allergy Precautions: None required Coagulopathies: Reviewed. None identified.  Blood-thinner therapy: None at this time Active Infection(s): Reviewed. None identified. Anna Mccarthy is afebrile  Site Confirmation: Anna Mccarthy was asked to confirm the procedure and laterality before marking the site Procedure checklist: Completed Consent: Before the procedure and under the influence of no sedative(s), amnesic(s), or anxiolytics, the patient was informed of the treatment options, risks and possible complications. To fulfill our ethical and legal obligations, as recommended by the American Medical Association's Code of Ethics, I have informed the patient of my clinical impression; the nature and purpose of the treatment or procedure; the risks, benefits, and possible complications of the intervention; the alternatives, including doing nothing; the risk(s) and benefit(s) of the alternative treatment(s) or procedure(s); and the risk(s) and benefit(s) of doing nothing. The patient was provided information about the general risks and possible complications associated with the procedure. These may include, but are not limited to: failure to achieve desired goals, infection, bleeding, organ or nerve damage, allergic reactions, paralysis, and death. In  addition, the patient was informed of those risks and complications associated to the procedure, such as failure to decrease pain; infection; bleeding; organ  or nerve damage with subsequent damage to sensory, motor, and/or autonomic systems, resulting in permanent pain, numbness, and/or weakness of one or several areas of the body; allergic reactions; (i.e.: anaphylactic reaction); and/or death. Furthermore, the patient was informed of those risks and complications associated with the medications. These include, but are not limited to: allergic reactions (i.e.: anaphylactic or anaphylactoid reaction(s)); adrenal axis suppression; blood sugar elevation that in diabetics may result in ketoacidosis or comma; water retention that in patients with history of congestive heart failure may result in shortness of breath, pulmonary edema, and decompensation with resultant heart failure; weight gain; swelling or edema; medication-induced neural toxicity; particulate matter embolism and blood vessel occlusion with resultant organ, and/or nervous system infarction; and/or aseptic necrosis of one or more joints. Finally, the patient was informed that Medicine is not an exact science; therefore, there is also the possibility of unforeseen or unpredictable risks and/or possible complications that may result in a catastrophic outcome. The patient indicated having understood very clearly. We have given the patient no guarantees and we have made no promises. Enough time was given to the patient to ask questions, all of which were answered to the patient's satisfaction. Anna Mccarthy has indicated that she wanted to continue with the procedure. Attestation: I, the ordering provider, attest that I have discussed with the patient the benefits, risks, side-effects, alternatives, likelihood of achieving goals, and potential problems during recovery for the procedure that I have provided informed consent. Date  Time: 02/24/2020  9:23  AM  Pre-Procedure Preparation:  Monitoring: As per clinic protocol. Respiration, ETCO2, SpO2, BP, heart rate and rhythm monitor placed and checked for adequate function Safety Precautions: Patient was assessed for positional comfort and pressure points before starting the procedure. Time-out: I initiated and conducted the "Time-out" before starting the procedure, as per protocol. The patient was asked to participate by confirming the accuracy of the "Time Out" information. Verification of the correct person, site, and procedure were performed and confirmed by me, the nursing staff, and the patient. "Time-out" conducted as per Joint Commission's Universal Protocol (UP.01.01.01). Time: 0948  Description of Procedure:          Target Area: Knee Joint Approach: Just above the Medial tibial plateau, lateral to the infrapatellar tendon. Area Prepped: Entire knee area, from the mid-thigh to the mid-shin. DuraPrep (Iodine Povacrylex [0.7% available iodine] and Isopropyl Alcohol, 74% w/w) Safety Precautions: Aspiration looking for blood return was conducted prior to all injections. At no point did we inject any substances, as a needle was being advanced. No attempts were made at seeking any paresthesias. Safe injection practices and needle disposal techniques used. Medications properly checked for expiration dates. SDV (single dose vial) medications used. Description of the Procedure: Protocol guidelines were followed. The patient was placed in position over the fluoroscopy table. The target area was identified and the area prepped in the usual manner. Skin & deeper tissues infiltrated with local anesthetic. Appropriate amount of time allowed to pass for local anesthetics to take effect. The procedure needles were then advanced to the target area. Proper needle placement secured. Negative aspiration confirmed. Solution injected in intermittent fashion, asking for systemic symptoms every 0.5cc of injectate. The  needles were then removed and the area cleansed, making sure to leave some of the prepping solution back to take advantage of its long term bactericidal properties. Vitals:   02/24/20 0927  BP: (!) 139/97  Pulse: 69  Resp: 16  Temp: (!) 96.6 F (35.9 C)  TempSrc:  Temporal  SpO2: 98%  Weight: 165 lb (74.8 kg)  Height: 5\' 2"  (1.575 m)    Start Time: 0948 hrs. End Time: 0949 hrs. Materials:  Needle(s) Type: Regular needle Gauge: 25G Length: 1.5-in Medication(s): Please see orders for medications and dosing details.  Imaging Guidance:          Type of Imaging Technique: None used Indication(s): N/A Exposure Time: No patient exposure Contrast: None used. Fluoroscopic Guidance: N/A Ultrasound Guidance: N/A Interpretation: N/A  Antibiotic Prophylaxis:   Anti-infectives (From admission, onward)   None     Indication(s): None identified  Post-operative Assessment:  Post-procedure Vital Signs:  Pulse/HCG Rate: 69  Temp: (!) 96.6 F (35.9 C) Resp: 16 BP: (!) 139/97 SpO2: 98 %  EBL: None  Complications: No immediate post-treatment complications observed by team, or reported by patient.  Note: The patient tolerated the entire procedure well. A repeat set of vitals were taken after the procedure and the patient was kept under observation following institutional policy, for this type of procedure. Post-procedural neurological assessment was performed, showing return to baseline, prior to discharge. The patient was provided with post-procedure discharge instructions, including a section on how to identify potential problems. Should any problems arise concerning this procedure, the patient was given instructions to immediately contact us, at any time, without hesitation. In any case, we plan to contact the patient by telephone for a follow-up status report regarding this interventional procedure.  Comments:  No additional relevant information.  Plan of Care  Orders:  No orders  of the defined types were placed in this encounter.   Medications ordered for procedure: Meds ordered this encounter  Medications  . Sodium Hyaluronate SOSY 2 mL  . lidocaine (XYLOCAINE) 2 % (with pres) injection 400 mg   Follow-up plan:   Return in about 4 weeks (around 03/23/2020) for Post Procedure Evaluation, virtual.      Status post left genicular RFA 07/30/2018 which provided significant pain relief for approximately 1 year now, repeat on 11/11/2019: Therapeutic.  Right knee Hyalgan injection #1 on 02/03/2020.  #2 02/24/2020   Recent Visits Date Type Provider Dept  02/03/20 Procedure visit Gillis Santa, MD [facility]  01/30/20 Office Visit Gillis Santa, MD [facility]  Showing recent visits within past 90 days and meeting all other requirements Today's Visits Date Type Provider Dept  02/24/20 Procedure visit Gillis Santa, MD [facility]  Showing today's visits and meeting all other requirements Future Appointments Date Type Provider Dept  03/23/20 Appointment Gillis Santa, MD [facility]  Showing future appointments within next 90 days and meeting all other requirements  Disposition: Discharge home  Discharge (Date  Time): 02/24/2020; 0952 hrs.   Primary Care Physician: Maryland Pink, MD Location: [facility] Note by: Gillis Santa, MD Date: 02/24/2020; Time: 12:17 PM  Disclaimer:  Medicine is not an exact science. The only guarantee in medicine is that nothing is guaranteed. It is important to note that the decision to proceed with this intervention was based on the information collected from the patient. The Data and conclusions were drawn from the patient's questionnaire, the interview, and the physical examination. Because the information was provided in large part by the patient, it cannot be guaranteed that it has not been purposely or unconsciously manipulated. Every effort has been made to obtain as  much relevant data as possible for this evaluation. It is important to note that the conclusions that lead to this procedure are derived  in large part from the available data. Always take into account that the treatment will also be dependent on availability of resources and existing treatment guidelines, considered by other Pain Management Practitioners as being common knowledge and practice, at the time of the intervention. For Medico-Legal purposes, it is also important to point out that variation in procedural techniques and pharmacological choices are the acceptable norm. The indications, contraindications, technique, and results of the above procedure should only be interpreted and judged by a Board-Certified Interventional Pain Specialist with extensive familiarity and expertise in the same exact procedure and technique.

## 2020-02-24 NOTE — Progress Notes (Signed)
Safety precautions to be maintained throughout the outpatient stay will include: orient to surroundings, keep bed in low position, maintain call bell within reach at all times, provide assistance with transfer out of bed and ambulation.  

## 2020-02-25 ENCOUNTER — Telehealth: Payer: Self-pay

## 2020-02-25 NOTE — Telephone Encounter (Signed)
Post procedure phone call.  Family states she is sleeping.  Instructed family to have patient call us back for any questions or concerns.

## 2020-03-19 ENCOUNTER — Telehealth: Payer: Self-pay | Admitting: *Deleted

## 2020-03-19 NOTE — Telephone Encounter (Signed)
Phone to patient prior to VV to go over PP results and medication list.  Asked to please call prior to appt.

## 2020-03-23 ENCOUNTER — Other Ambulatory Visit: Payer: Self-pay

## 2020-03-23 ENCOUNTER — Ambulatory Visit
Payer: Medicare Other | Attending: Student in an Organized Health Care Education/Training Program | Admitting: Student in an Organized Health Care Education/Training Program

## 2020-03-23 ENCOUNTER — Encounter: Payer: Self-pay | Admitting: Student in an Organized Health Care Education/Training Program

## 2020-03-23 DIAGNOSIS — M25561 Pain in right knee: Secondary | ICD-10-CM | POA: Diagnosis not present

## 2020-03-23 DIAGNOSIS — M1711 Unilateral primary osteoarthritis, right knee: Secondary | ICD-10-CM

## 2020-03-23 DIAGNOSIS — G894 Chronic pain syndrome: Secondary | ICD-10-CM

## 2020-03-23 DIAGNOSIS — G8929 Other chronic pain: Secondary | ICD-10-CM

## 2020-03-23 NOTE — Progress Notes (Signed)
Patient: Anna Mccarthy  Service Category: E/M  Provider: Gillis Santa, MD  DOB: 04/06/1956  DOS: 03/23/2020  Location: Office  MRN: [id number]  Setting: Ambulatory outpatient  Referring Provider: Maryland Pink, MD  Type: Established Patient  Specialty: Interventional Pain Management  PCP: Maryland Pink, MD  Location: Home  Delivery: TeleHealth     Virtual Encounter - Pain Management PROVIDER NOTE: Information contained herein reflects review and annotations entered in association with encounter. Interpretation of such information and data should be left to medically-trained personnel. Information provided to patient can be located elsewhere in the medical record under "Patient Instructions". Document created using STT-dictation technology, any transcriptional errors that may result from process are unintentional.    Contact & Pharmacy Preferred: [place]: [phone] (home) Mobile: [phone] (mobile) E-mail: [id number] [address] [place] [address] Phone: [phone] Fax: [phone]  [place], [place] - [address] [place] [address] Phone: [phone] Fax: [phone]   Pre-screening  Anna Mccarthy offered "in-person" vs "virtual" encounter. She indicated preferring virtual for this encounter.   Reason COVID-19*  Social distancing based on CDC and AMA recommendations.   I contacted Anna Mccarthy on 03/23/2020 via video conference.      I clearly identified myself as Gillis Santa, MD. I verified that I was speaking with the correct person using two identifiers (Name: Anna Mccarthy, and date of birth: Dec 12, 1955).  Consent I sought verbal advanced consent from Anna Mccarthy for virtual visit interactions. I informed Anna Mccarthy of possible security and privacy concerns, risks, and  limitations associated with providing "not-in-person" medical evaluation and management services. I also informed Anna Mccarthy of the availability of "in-person" appointments. Finally, I informed her that there would be a charge for the virtual visit and that she could be  personally, fully or partially, financially responsible for it. Anna Mccarthy expressed understanding and agreed to proceed.   Historic Elements   Anna Mccarthy is a 64 y.o. year old, female patient evaluated today after our last contact on 02/24/2020. Anna Mccarthy  has a past medical history of Allergy, Anxiety, Arthritis, Bronchitis, Cancer ([place]), GERD (gastroesophageal reflux disease), and Hypertension. She also  has a past surgical history that includes Tubal ligation (1984); Abdominal hysterectomy (1996); Hernia repair (2012); Colonoscopy; Knee arthroscopy with medial menisectomy (Left, 02/18/2016); Colonoscopy with propofol (N/A, 05/30/2016); and Breast biopsy (Left, 2011). Anna Mccarthy has a current medication list which includes the following prescription(s): alprazolam, alprazolam, b complex vitamins, duloxetine, escitalopram, fluocinonide, gabapentin, hydrochlorothiazide, hydroxychloroquine, omega-3 fatty acids, omeprazole, tizanidine, tramadol, turmeric, and vitamin e. She  reports that she has never smoked. She has never used smokeless tobacco. She reports current alcohol use of about 2.0 standard drinks of alcohol per week. She reports that she does not use drugs. Anna Mccarthy is allergic to percocet [oxycodone-acetaminophen] and sulfa antibiotics.   HPI  Today, she is being contacted for a post-procedure assessment.   Post-Procedure Evaluation  Procedure (02/24/2020): RIGHT knee hyalgan #2  Sedation: Please see nurses note.  Effectiveness during initial hour after procedure(Ultra-Short Term Relief): 100%  Local anesthetic used: Long-acting (4-6 hours) Effectiveness: Defined as any analgesic benefit obtained secondary to the  administration of local anesthetics. This carries significant diagnostic value as to the etiological location, or anatomical origin, of the pain. Duration of benefit  is expected to coincide with the duration of the local anesthetic used.  Effectiveness during initial 4-6 hours after procedure(Short-Term Relief): 50%  Long-term benefit: Defined as any relief past the pharmacologic duration of the local anesthetics.  Effectiveness past the initial 6 hours after procedure(Long-Term Relief): 25%  Current benefits: Defined as benefit that persist at this time.   Analgesia:  Back to baseline Function: Back to baseline ROM: Back to baseline   Laboratory Chemistry Profile   Renal Lab Results  Component Value Date   BUN 18 10/07/2014   CREATININE 0.86 10/07/2014   GFRAA >60 10/07/2014   GFRNONAA >60 10/07/2014     Hepatic Lab Results  Component Value Date   AST 24 10/07/2014   ALT 20 10/07/2014   ALBUMIN 4.4 10/07/2014   ALKPHOS 78 10/07/2014     Electrolytes Lab Results  Component Value Date   NA 135 10/07/2014   K 3.7 10/07/2014   CL 99 (L) 10/07/2014   CALCIUM 9.0 10/07/2014     Bone No results found for: VD25OH, VD125OH2TOT, VE7209OB0, JG2836OQ9, 25OHVITD1, 25OHVITD2, 25OHVITD3, TESTOFREE, TESTOSTERONE   Inflammation (CRP: Acute Phase) (ESR: Chronic Phase) No results found for: CRP, ESRSEDRATE, LATICACIDVEN     Note: Above Lab results reviewed.  Imaging  DG Knee 1-2 Views Right CLINICAL DATA:  Pain  EXAM: RIGHT KNEE - 1-2 VIEW  COMPARISON:  None.  FINDINGS: Standing frontal and standing lateral views were obtained. No fracture or dislocation. There is a small joint effusion. There is mild narrowing medially. Other joint spaces appear normal. No erosive change.  IMPRESSION: Mild narrowing medially. Other joint spaces appear normal. Small joint effusion. No fracture or dislocation.  Electronically Signed   By: Lowella Grip III M.D.   On: 01/30/2020  10:04  Assessment  The primary encounter diagnosis was Arthritis of right knee. Diagnoses of Chronic pain of right knee and Chronic pain syndrome were also pertinent to this visit.  Plan of Care  Anna Mccarthy has a current medication list which includes the following long-term medication(s): duloxetine, escitalopram, gabapentin, hydrochlorothiazide, and omeprazole.  Postprocedural follow-up after right knee Hyalgan injection #2.  Patient states that she did not receive as much benefit with her Hyalgan injection #2 than she did with her initial 1, #1.  Recommend repeating number 3 injection Hyalgan to assess response.  If she does have a therapeutic response after Hyalgan injection #3, will recommend finishing off with 4-5.  If however she does not receive benefit after her next Hyalgan injection, will discuss/schedule right knee genicular nerve block and possible genicular nerve radiofrequency ablation.  Of note, patient has had left knee genicular nerve RFA which has been favorable for her in terms of pain relief and ability to bear weight on left knee and be more functional with less pain.  Orders:  Orders Placed This Encounter  Procedures  . KNEE INJECTION    Hyalgan knee injection. Please order Hyalgan.    Standing Status:   Future    Standing Expiration Date:   04/22/2020    Scheduling Instructions:     Procedure: Intra-articular Hyalgan Knee injection #3            Side: RIGHT     Sedation: None     Timeframe: in two (2) weeks    Order Specific Question:   Where will this procedure be performed?    Answer:   [facility]   Follow-up plan:   Return in about 1 week (around  03/30/2020) for R knee hyalgan #3.     Status post left genicular RFA 07/30/2018 which provided significant pain relief for approximately 1 year now, repeat on 11/11/2019: Therapeutic.  Right knee Hyalgan injection #1 on 02/03/2020.  #2 02/24/2020    Recent Visits Date Type Provider Dept  02/24/20 Procedure  visit Gillis Santa, MD [facility]  02/03/20 Procedure visit Gillis Santa, MD [facility]  01/30/20 Office Visit Gillis Santa, MD [facility]  Showing recent visits within past 90 days and meeting all other requirements Today's Visits Date Type Provider Dept  03/23/20 Telemedicine Gillis Santa, MD [facility]  Showing today's visits and meeting all other requirements Future Appointments No visits were found meeting these conditions. Showing future appointments within next 90 days and meeting all other requirements  I discussed the assessment and treatment plan with the patient. The patient was provided an opportunity to ask questions and all were answered. The patient agreed with the plan and demonstrated an understanding of the instructions.  Patient advised to call back or seek an in-person evaluation if the symptoms or condition worsens.  Duration of encounter: 20 minutes.  Note by: Gillis Santa, MD Date: 03/23/2020; Time: 2:16 PM

## 2020-03-30 ENCOUNTER — Ambulatory Visit
Payer: Medicare Other | Attending: Student in an Organized Health Care Education/Training Program | Admitting: Student in an Organized Health Care Education/Training Program

## 2020-03-30 ENCOUNTER — Other Ambulatory Visit: Payer: Self-pay

## 2020-03-30 ENCOUNTER — Encounter: Payer: Self-pay | Admitting: Student in an Organized Health Care Education/Training Program

## 2020-03-30 VITALS — BP 140/84 | HR 73 | Temp 98.1°F | Resp 16 | Ht 63.0 in | Wt 165.0 lb

## 2020-03-30 DIAGNOSIS — G8929 Other chronic pain: Secondary | ICD-10-CM

## 2020-03-30 DIAGNOSIS — M25561 Pain in right knee: Secondary | ICD-10-CM | POA: Diagnosis present

## 2020-03-30 DIAGNOSIS — M1712 Unilateral primary osteoarthritis, left knee: Secondary | ICD-10-CM | POA: Insufficient documentation

## 2020-03-30 DIAGNOSIS — G894 Chronic pain syndrome: Secondary | ICD-10-CM | POA: Diagnosis present

## 2020-03-30 DIAGNOSIS — M25562 Pain in left knee: Secondary | ICD-10-CM | POA: Insufficient documentation

## 2020-03-30 DIAGNOSIS — M23307 Other meniscus derangements, unspecified meniscus, left knee: Secondary | ICD-10-CM

## 2020-03-30 DIAGNOSIS — M1711 Unilateral primary osteoarthritis, right knee: Secondary | ICD-10-CM | POA: Diagnosis present

## 2020-03-30 MED ORDER — LIDOCAINE HCL 2 % IJ SOLN
INTRAMUSCULAR | Status: AC
  Filled 2020-03-30: qty 20

## 2020-03-30 MED ORDER — SODIUM HYALURONATE (VISCOSUP) 20 MG/2ML IX SOSY
2.0000 mL | PREFILLED_SYRINGE | Freq: Once | INTRA_ARTICULAR | Status: AC
  Administered 2020-03-30: 2 mL via INTRA_ARTICULAR

## 2020-03-30 MED ORDER — LIDOCAINE HCL (PF) 1 % IJ SOLN: 5.0000 mL | Freq: Once | INTRAMUSCULAR | Status: DC

## 2020-03-30 NOTE — Progress Notes (Signed)
Safety precautions to be maintained throughout the outpatient stay will include: orient to surroundings, keep bed in low position, maintain call bell within reach at all times, provide assistance with transfer out of bed and ambulation.  

## 2020-03-30 NOTE — Patient Instructions (Signed)

## 2020-03-30 NOTE — Progress Notes (Signed)
PROVIDER NOTE: Information contained herein reflects review and annotations entered in association with encounter. Interpretation of such information and data should be left to medically-trained personnel. Information provided to patient can be located elsewhere in the medical record under "Patient Instructions". Document created using STT-dictation technology, any transcriptional errors that may result from process are unintentional.    Patient: Anna Mccarthy  Service Category: Procedure  Provider: Gillis Santa, MD  DOB: December 26, 1955  DOS: 03/30/2020  Location: [facility]  MRN: [id number]  Setting: Ambulatory - outpatient  Referring Provider: Maryland Pink, MD  Type: Established Patient  Specialty: Interventional Pain Management  PCP: Maryland Pink, MD   Primary Reason for Visit: Interventional Pain Management Treatment. CC: Right knee pain  Procedure:          Anesthesia, Analgesia, Anxiolysis:  Type: Diagnostic Intra-Articular Hyalgan Knee Injection #3  Region: Medial infrapatellar Knee Region Level: Knee Joint Laterality: Right knee  Type: Local Anesthesia Indication(s): Analgesia         Local Anesthetic: Lidocaine 1-2% Route: Infiltration ([place]/IM) IV Access: Declined Sedation: Declined   Position: Sitting   Indications: 1. Arthritis of right knee   2. Chronic pain of right knee   3. Chronic pain syndrome   4. Chronic pain of left knee   5. Primary osteoarthritis of left knee   6. Meniscus degeneration, left    Pain Score: Pre-procedure: 3 /10 Post-procedure: 3 /10   Pre-op Assessment:  Anna Mccarthy is a 64 y.o. (year old), female patient, seen today for interventional treatment. She  has a past surgical history that includes Tubal ligation (1984); Abdominal hysterectomy (1996); Hernia repair (2012); Colonoscopy; Knee arthroscopy with medial menisectomy (Left, 02/18/2016); Colonoscopy with propofol (N/A, 05/30/2016); and Breast biopsy (Left, 2011). [name] has a  current medication list which includes the following prescription(s): alprazolam, duloxetine, escitalopram, fluocinonide, gabapentin, hydrochlorothiazide, hydroxychloroquine, omeprazole, tizanidine, alprazolam, b complex vitamins, omega-3 fatty acids, tramadol, turmeric, and vitamin e, and the following Facility-Administered Medications: lidocaine (pf). Her primarily concern today is the Knee Pain (right )  Initial Vital Signs:  Pulse/HCG Rate: 73  Temp: 98.1 F (36.7 C) Resp: 16 BP: 140/84 SpO2: 97 %  BMI: Estimated body mass index is 29.23 kg/m as calculated from the following:   Height as of this encounter: 5\' 3"  (1.6 m).   Weight as of this encounter: 165 lb (74.8 kg).  Risk Assessment: Allergies: Reviewed. She is allergic to percocet [oxycodone-acetaminophen] and sulfa antibiotics.  Allergy Precautions: None required Coagulopathies: Reviewed. None identified.  Blood-thinner therapy: None at this time Active Infection(s): Reviewed. None identified. [name] is afebrile  Site Confirmation: [name] was asked to confirm the procedure and laterality before marking the site Procedure checklist: Completed Consent: Before the procedure and under the influence of no sedative(s), amnesic(s), or anxiolytics, the patient was informed of the treatment options, risks and possible complications. To fulfill our ethical and legal obligations, as recommended by the American Medical Association's Code of Ethics, I have informed the patient of my clinical impression; the nature and purpose of the treatment or procedure; the risks, benefits, and possible complications of the intervention; the alternatives, including doing nothing; the risk(s) and benefit(s) of the alternative treatment(s) or procedure(s); and the risk(s) and benefit(s) of doing nothing. The patient was provided information about the general risks and possible complications associated with the procedure. These may include, but are not  limited to: failure to achieve desired goals, infection, bleeding, organ or nerve damage, allergic reactions, paralysis,  and death. In addition, the patient was informed of those risks and complications associated to the procedure, such as failure to decrease pain; infection; bleeding; organ or nerve damage with subsequent damage to sensory, motor, and/or autonomic systems, resulting in permanent pain, numbness, and/or weakness of one or several areas of the body; allergic reactions; (i.e.: anaphylactic reaction); and/or death. Furthermore, the patient was informed of those risks and complications associated with the medications. These include, but are not limited to: allergic reactions (i.e.: anaphylactic or anaphylactoid reaction(s)); adrenal axis suppression; blood sugar elevation that in diabetics may result in ketoacidosis or comma; water retention that in patients with history of congestive heart failure may result in shortness of breath, pulmonary edema, and decompensation with resultant heart failure; weight gain; swelling or edema; medication-induced neural toxicity; particulate matter embolism and blood vessel occlusion with resultant organ, and/or nervous system infarction; and/or aseptic necrosis of one or more joints. Finally, the patient was informed that Medicine is not an exact science; therefore, there is also the possibility of unforeseen or unpredictable risks and/or possible complications that may result in a catastrophic outcome. The patient indicated having understood very clearly. We have given the patient no guarantees and we have made no promises. Enough time was given to the patient to ask questions, all of which were answered to the patient's satisfaction. [name] has indicated that she wanted to continue with the procedure. Attestation: I, the ordering provider, attest that I have discussed with the patient the benefits, risks, side-effects, alternatives, likelihood of achieving  goals, and potential problems during recovery for the procedure that I have provided informed consent. Date  Time: 03/30/2020  9:22 AM  Pre-Procedure Preparation:  Monitoring: As per clinic protocol. Respiration, ETCO2, SpO2, BP, heart rate and rhythm monitor placed and checked for adequate function Safety Precautions: Patient was assessed for positional comfort and pressure points before starting the procedure. Time-out: I initiated and conducted the "Time-out" before starting the procedure, as per protocol. The patient was asked to participate by confirming the accuracy of the "Time Out" information. Verification of the correct person, site, and procedure were performed and confirmed by me, the nursing staff, and the patient. "Time-out" conducted as per Joint Commission's Universal Protocol (UP.01.01.01). Time: 0944  Description of Procedure:          Target Area: Knee Joint Approach: Just above the Medial tibial plateau, lateral to the infrapatellar tendon. Area Prepped: Entire knee area, from the mid-thigh to the mid-shin. DuraPrep (Iodine Povacrylex [0.7% available iodine] and Isopropyl Alcohol, 74% w/w) Safety Precautions: Aspiration looking for blood return was conducted prior to all injections. At no point did we inject any substances, as a needle was being advanced. No attempts were made at seeking any paresthesias. Safe injection practices and needle disposal techniques used. Medications properly checked for expiration dates. SDV (single dose vial) medications used. Description of the Procedure: Protocol guidelines were followed. The patient was placed in position over the fluoroscopy table. The target area was identified and the area prepped in the usual manner. Skin & deeper tissues infiltrated with local anesthetic. Appropriate amount of time allowed to pass for local anesthetics to take effect. The procedure needles were then advanced to the target area. Proper needle placement secured.  Negative aspiration confirmed. Solution injected in intermittent fashion, asking for systemic symptoms every 0.5cc of injectate. The needles were then removed and the area cleansed, making sure to leave some of the prepping solution back to take advantage of its long term  bactericidal properties. Vitals:   03/30/20 0927  BP: 140/84  Pulse: 73  Resp: 16  Temp: 98.1 F (36.7 C)  TempSrc: Temporal  SpO2: 97%  Weight: 165 lb (74.8 kg)  Height: 5\' 3"  (1.6 m)    Start Time: 0944 hrs. End Time: 0944 hrs. Materials:  Needle(s) Type: Regular needle Gauge: 25G Length: 1.5-in Medication(s): Please see orders for medications and dosing details.  Imaging Guidance:          Type of Imaging Technique: None used Indication(s): N/A Exposure Time: No patient exposure Contrast: None used. Fluoroscopic Guidance: N/A Ultrasound Guidance: N/A Interpretation: N/A  Antibiotic Prophylaxis:   Anti-infectives (From admission, onward)   None     Indication(s): None identified  Post-operative Assessment:  Post-procedure Vital Signs:  Pulse/HCG Rate: 73  Temp: 98.1 F (36.7 C) Resp: 16 BP: 140/84 SpO2: 97 %  EBL: None  Complications: No immediate post-treatment complications observed by team, or reported by patient.  Note: The patient tolerated the entire procedure well. A repeat set of vitals were taken after the procedure and the patient was kept under observation following institutional policy, for this type of procedure. Post-procedural neurological assessment was performed, showing return to baseline, prior to discharge. The patient was provided with post-procedure discharge instructions, including a section on how to identify potential problems. Should any problems arise concerning this procedure, the patient was given instructions to immediately contact us, at any time, without hesitation. In any case, we plan to contact the patient by telephone for a follow-up status report regarding  this interventional procedure.  Comments:  No additional relevant information.  Plan of Care  4-week post procedure evaluation after Hyalgan injection #3 As needed order for repeat left knee genicular nerve RFA as the patient is endorsing return of left knee pain with weightbearing  Orders:  Orders Placed This Encounter  Procedures  . Radiofrequency,Genicular    For knee pain.    Standing Status:   Standing    Number of Occurrences:   1    Standing Expiration Date:   03/30/2021    Scheduling Instructions:     Side(s):LEFT     Level(s): Superior-Lateral, Superior-Medial, and Inferior-Medial Genicular Nerve(s)     Sedation: Patient's choice.     TIMEFRAME: PRN procedure. (Ms. Iacovelli will call when needed.)    Order Specific Question:   Where will this procedure be performed?    Answer:   [facility]     Medications ordered for procedure: Meds ordered this encounter  Medications  . lidocaine (PF) (XYLOCAINE) 1 % injection 5 mL  . Sodium Hyaluronate SOSY 2 mL   Follow-up plan:   Return in about 8 weeks (around 05/25/2020) for Post Procedure Evaluation, virtual.      Status post left genicular RFA 07/30/2018 which provided significant pain relief for approximately 1 year now, repeat on 11/11/2019: Therapeutic.  Right knee Hyalgan injection #1 on 02/03/2020.  #2 02/24/2020   Recent Visits Date Type Provider Dept  03/23/20 Telemedicine Gillis Santa, MD [facility]  02/24/20 Procedure visit Gillis Santa, MD [facility]  02/03/20 Procedure visit Gillis Santa, MD [facility]  01/30/20 Office Visit Gillis Santa, MD [facility]  Showing recent visits within past 90 days and meeting all other requirements Today's Visits Date Type Provider Dept  03/30/20 Procedure visit Gillis Santa, MD [facility]  Showing today's visits and meeting all other requirements Future Appointments Date Type Provider Dept  05/25/20 Appointment Holley Raring,  Carlus Pavlov, MD [facility]  Showing future appointments within next 90 days and meeting all other requirements  Disposition: Discharge home  Discharge (Date  Time): 03/30/2020; 0946 hrs.   Primary Care Physician: Maryland Pink, MD Location: [facility] Note by: Gillis Santa, MD Date: 03/30/2020; Time: 10:26 AM  Disclaimer:  Medicine is not an exact science. The only guarantee in medicine is that nothing is guaranteed. It is important to note that the decision to proceed with this intervention was based on the information collected from the patient. The Data and conclusions were drawn from the patient's questionnaire, the interview, and the physical examination. Because the information was provided in large part by the patient, it cannot be guaranteed that it has not been purposely or unconsciously manipulated. Every effort has been made to obtain as much relevant data as possible for this evaluation. It is important to note that the conclusions that lead to this procedure are derived in large part from the available data. Always take into account that the treatment will also be dependent on availability of resources and existing treatment guidelines, considered by other Pain Management Practitioners as being common knowledge and practice, at the time of the intervention. For Medico-Legal purposes, it is also important to point out that variation in procedural techniques and pharmacological choices are the acceptable norm. The indications, contraindications, technique, and results of the above procedure should only be interpreted and judged by a Board-Certified Interventional Pain Specialist with extensive familiarity and expertise in the same exact procedure and technique.

## 2020-03-31 ENCOUNTER — Telehealth: Payer: Self-pay

## 2020-03-31 NOTE — Telephone Encounter (Signed)
Post procedure phone call.  LM 

## 2020-04-02 ENCOUNTER — Other Ambulatory Visit: Payer: Self-pay | Admitting: Family Medicine

## 2020-04-02 DIAGNOSIS — Z1231 Encounter for screening mammogram for malignant neoplasm of breast: Secondary | ICD-10-CM

## 2020-04-07 ENCOUNTER — Telehealth: Payer: Self-pay

## 2020-04-07 NOTE — Telephone Encounter (Signed)
She wants to speak to the triage nurse.

## 2020-04-07 NOTE — Telephone Encounter (Signed)
Having increased pain in right knee. Had Hyalgan #3 on 03-30-20. Had very minimal relief.

## 2020-04-08 NOTE — Telephone Encounter (Signed)
Please call patient to schedule.

## 2020-04-10 NOTE — Progress Notes (Signed)
Attempted to call patient about visit on 04/13/2020. No answer, Left message to confirm appt and to call us back before appt.. Pt had Knee injection on 10/4

## 2020-04-13 ENCOUNTER — Other Ambulatory Visit: Payer: Self-pay

## 2020-04-13 ENCOUNTER — Encounter: Payer: Self-pay | Admitting: Student in an Organized Health Care Education/Training Program

## 2020-04-13 ENCOUNTER — Ambulatory Visit
Payer: Medicare Other | Attending: Student in an Organized Health Care Education/Training Program | Admitting: Student in an Organized Health Care Education/Training Program

## 2020-04-13 DIAGNOSIS — G8929 Other chronic pain: Secondary | ICD-10-CM

## 2020-04-13 DIAGNOSIS — M1711 Unilateral primary osteoarthritis, right knee: Secondary | ICD-10-CM | POA: Diagnosis not present

## 2020-04-13 DIAGNOSIS — M25562 Pain in left knee: Secondary | ICD-10-CM

## 2020-04-13 DIAGNOSIS — G894 Chronic pain syndrome: Secondary | ICD-10-CM

## 2020-04-13 DIAGNOSIS — M1712 Unilateral primary osteoarthritis, left knee: Secondary | ICD-10-CM

## 2020-04-13 DIAGNOSIS — M25561 Pain in right knee: Secondary | ICD-10-CM | POA: Diagnosis not present

## 2020-04-13 NOTE — Progress Notes (Signed)
Patient: Anna Mccarthy  Service Category: E/M  Provider: Gillis Santa, MD  DOB: [birth year]  DOS: 04/13/2020  Location: Office  MRN: [id number]  Setting: Ambulatory outpatient  Referring Provider: Maryland Pink, MD  Type: Established Patient  Specialty: Interventional Pain Management  PCP: Maryland Pink, MD  Location: Home  Delivery: TeleHealth     Virtual Encounter - Pain Management PROVIDER NOTE: Information contained herein reflects review and annotations entered in association with encounter. Interpretation of such information and data should be left to medically-trained personnel. Information provided to patient can be located elsewhere in the medical record under "Patient Instructions". Document created using STT-dictation technology, any transcriptional errors that may result from process are unintentional.    Contact & Pharmacy Preferred: [phone] Home: [phone] (home) Mobile: [phone] (mobile) E-mail: [email or web address] [address], [address] [place] [address] Phone: [phone] Fax: [phone]  [place], [place] - [address] [place] [address] Phone: [phone] Fax: [phone]   Pre-screening  Anna Mccarthy offered "in-person" vs "virtual" encounter. She indicated preferring virtual for this encounter.   Reason COVID-19*  Social distancing based on CDC and AMA recommendations.   I contacted Anna Mccarthy on 04/13/2020 via video conference.      I clearly identified myself as Gillis Santa, MD. I verified that I was speaking with the correct person using two identifiers (Name: Anna Mccarthy, and date of birth: 11/26/1955).  Consent I sought verbal advanced consent from Anna Mccarthy for virtual visit interactions. I informed Anna Mccarthy of possible security and privacy concerns, risks, and  limitations associated with providing "not-in-person" medical evaluation and management services. I also informed Anna Mccarthy of the availability of "in-person" appointments. Finally, I informed her that there would be a charge for the virtual visit and that she could be  personally, fully or partially, financially responsible for it. Anna Mccarthy expressed understanding and agreed to proceed.   Historic Elements   Anna Mccarthy is a 64 y.o. year old, female patient evaluated today after our last contact on 03/30/2020. Anna Mccarthy  has a past medical history of Allergy, Anxiety, Arthritis, Bronchitis, Cancer ([place]), GERD (gastroesophageal reflux disease), and Hypertension. She also  has a past surgical history that includes Tubal ligation (1984); Abdominal hysterectomy (1996); Hernia repair (2012); Colonoscopy; Knee arthroscopy with medial menisectomy (Left, 02/18/2016); Colonoscopy with propofol (N/A, 05/30/2016); and Breast biopsy (Left, 2011). Anna Mccarthy has a current medication list which includes the following prescription(s): alprazolam, alprazolam, b complex vitamins, duloxetine, escitalopram, fluocinonide, gabapentin, hydrochlorothiazide, hydroxychloroquine, omega-3 fatty acids, omeprazole, tizanidine, tramadol, turmeric, and vitamin e. She  reports that she has never smoked. She has never used smokeless tobacco. She reports current alcohol use of about 2.0 standard drinks of alcohol per week. She reports that she does not use drugs. Anna Mccarthy is allergic to percocet [oxycodone-acetaminophen] and sulfa antibiotics.   HPI  Today, she is being contacted for a post-procedure assessment.  Status post right intra-articular right knee Hyalgan injection x3 with her third 1 being on 03/30/2020.  No improvement in right knee pain after Hyalgan series.  Has failed right knee intra-articular steroid injections as well.  Has done physical therapy in the past without much benefit.  Next therapeutic option would be  diagnostic genicular nerve block for the right knee.  Patient has had a  left knee genicular nerve block and a subsequent therapeutic left knee genicular radiofrequency ablation which is helped with her left knee pain and mobility.  Risks and benefits reviewed of right knee genicular nerve block and subsequent radiofrequency ablation.  She is also having increased pain of her left knee.  Can consider repeating left knee genicular nerve block on same day as right knee diagnostic genicular nerve block.  Laboratory Chemistry Profile   Renal Lab Results  Component Value Date   BUN 18 10/07/2014   CREATININE 0.86 10/07/2014   GFRAA >60 10/07/2014   GFRNONAA >60 10/07/2014     Hepatic Lab Results  Component Value Date   AST 24 10/07/2014   ALT 20 10/07/2014   ALBUMIN 4.4 10/07/2014   ALKPHOS 78 10/07/2014     Electrolytes Lab Results  Component Value Date   NA 135 10/07/2014   K 3.7 10/07/2014   CL 99 (L) 10/07/2014   CALCIUM 9.0 10/07/2014     Bone No results found for: VD25OH, VD125OH2TOT, EY8144YJ8, HU3149FW2, 25OHVITD1, 25OHVITD2, 25OHVITD3, TESTOFREE, TESTOSTERONE   Inflammation (CRP: Acute Phase) (ESR: Chronic Phase) No results found for: CRP, ESRSEDRATE, LATICACIDVEN     Note: Above Lab results reviewed.  Imaging  DG Knee 1-2 Views Right CLINICAL DATA:  Pain  EXAM: RIGHT KNEE - 1-2 VIEW  COMPARISON:  None.  FINDINGS: Standing frontal and standing lateral views were obtained. No fracture or dislocation. There is a small joint effusion. There is mild narrowing medially. Other joint spaces appear normal. No erosive change.  IMPRESSION: Mild narrowing medially. Other joint spaces appear normal. Small joint effusion. No fracture or dislocation.  Electronically Signed   By: Lowella Grip III M.D.   On: 01/30/2020 10:04  Assessment  The primary encounter diagnosis was Arthritis of right knee. Diagnoses of Chronic pain of right knee, Chronic pain syndrome,  Chronic pain of left knee, and Primary osteoarthritis of left knee were also pertinent to this visit.  Plan of Care  Right knee osteoarthritis: Has not responded to physical therapy, intra-articular steroid injection, intra-articular Hyalgan series x3.  Plan for right knee genicular nerve block, diagnostic and if helpful can pursue right knee genicular RFA.  Can consider left knee genicular nerve block as well on same day as patient is having increased pain of her left knee. Orders:  Orders Placed This Encounter  Procedures  . GENICULAR NERVE BLOCK    Indication(s):  Sub-acute knee pain    Standing Status:   Future    Standing Expiration Date:   07/14/2020    Scheduling Instructions:     Side: Bilateral     Sedation: Patient's choice.     Timeframe: As soon as schedule allows    Order Specific Question:   Where will this procedure be performed?    Answer:   [facility]   Follow-up plan:   Return in about 1 week (around 04/20/2020) for Genicular nerve block, with sedation.     Status post left genicular RFA 07/30/2018 which provided significant pain relief for approximately 1 year now, repeat on 11/11/2019: Therapeutic.  Right knee Hyalgan injection #1 on 02/03/2020.  #2 02/24/2020    Recent Visits Date Type Provider Dept  03/30/20 Procedure visit Gillis Santa, MD [facility]  03/23/20 Telemedicine Gillis Santa, MD [facility]  02/24/20 Procedure visit Gillis Santa, MD [facility]  02/03/20 Procedure visit Gillis Santa, MD [facility]  01/30/20 Office Visit Gillis Santa, MD [facility]  Showing recent visits within past 90 days and meeting all other requirements Today's Visits Date Type Provider Dept  04/13/20 Telemedicine Gillis Santa, MD [facility]  Showing today's visits and meeting all other requirements Future Appointments Date Type Provider Dept  05/25/20 Appointment Gillis Santa, MD [facility]   Showing future appointments within next 90 days and meeting all other requirements  I discussed the assessment and treatment plan with the patient. The patient was provided an opportunity to ask questions and all were answered. The patient agreed with the plan and demonstrated an understanding of the instructions.  Patient advised to call back or seek an in-person evaluation if the symptoms or condition worsens.  Duration of encounter: 20 minutes.  Note by: Gillis Santa, MD Date: 04/13/2020; Time: 12:36 PM

## 2020-04-15 ENCOUNTER — Other Ambulatory Visit: Payer: Self-pay

## 2020-04-15 ENCOUNTER — Ambulatory Visit (HOSPITAL_BASED_OUTPATIENT_CLINIC_OR_DEPARTMENT_OTHER): Payer: Medicare Other | Admitting: Student in an Organized Health Care Education/Training Program

## 2020-04-15 ENCOUNTER — Ambulatory Visit
Admission: RE | Admit: 2020-04-15 | Discharge: 2020-04-15 | Disposition: A | Discharge status: Home / Self Care | Payer: Medicare Other | Source: Ambulatory Visit | Attending: Student in an Organized Health Care Education/Training Program | Admitting: Student in an Organized Health Care Education/Training Program

## 2020-04-15 ENCOUNTER — Telehealth: Payer: Self-pay | Admitting: Student in an Organized Health Care Education/Training Program

## 2020-04-15 ENCOUNTER — Encounter: Payer: Self-pay | Admitting: Student in an Organized Health Care Education/Training Program

## 2020-04-15 VITALS — BP 168/100 | HR 76 | Temp 97.0°F | Resp 13 | Ht 63.0 in | Wt 170.0 lb

## 2020-04-15 DIAGNOSIS — G8929 Other chronic pain: Secondary | ICD-10-CM

## 2020-04-15 DIAGNOSIS — G894 Chronic pain syndrome: Secondary | ICD-10-CM | POA: Diagnosis not present

## 2020-04-15 DIAGNOSIS — M25561 Pain in right knee: Secondary | ICD-10-CM

## 2020-04-15 DIAGNOSIS — M1711 Unilateral primary osteoarthritis, right knee: Secondary | ICD-10-CM | POA: Diagnosis present

## 2020-04-15 MED ORDER — ROPIVACAINE HCL 2 MG/ML IJ SOLN
9.0000 mL | Freq: Once | INTRAMUSCULAR | Status: AC
  Administered 2020-04-15: 9 mL via PERINEURAL

## 2020-04-15 MED ORDER — ROPIVACAINE HCL 2 MG/ML IJ SOLN
INTRAMUSCULAR | Status: AC
  Filled 2020-04-15: qty 10

## 2020-04-15 MED ORDER — LIDOCAINE HCL 2 % IJ SOLN
INTRAMUSCULAR | Status: AC
  Filled 2020-04-15: qty 20

## 2020-04-15 MED ORDER — DEXAMETHASONE SODIUM PHOSPHATE 10 MG/ML IJ SOLN
INTRAMUSCULAR | Status: AC
  Filled 2020-04-15: qty 1

## 2020-04-15 MED ORDER — DEXAMETHASONE SODIUM PHOSPHATE 10 MG/ML IJ SOLN
10.0000 mg | Freq: Once | INTRAMUSCULAR | Status: AC
  Administered 2020-04-15: 10 mg

## 2020-04-15 MED ORDER — LIDOCAINE HCL 2 % IJ SOLN
20.0000 mL | Freq: Once | INTRAMUSCULAR | Status: AC
  Administered 2020-04-15: 400 mg

## 2020-04-15 MED ORDER — FENTANYL CITRATE (PF) 100 MCG/2ML IJ SOLN
25.0000 ug | INTRAMUSCULAR | Status: DC | PRN
  Administered 2020-04-15: 50 ug via INTRAVENOUS

## 2020-04-15 MED ORDER — FENTANYL CITRATE (PF) 100 MCG/2ML IJ SOLN
INTRAMUSCULAR | Status: AC
Start: 2020-04-15 — End: ?
  Filled 2020-04-15: qty 2

## 2020-04-15 NOTE — Telephone Encounter (Signed)
Patient thought you were going to order bilateral knee RFA.  She has VV already scheduled for 05-25-20 and 07-16-20, both for PP folllow-ups.. Do you want her to keep both of these?

## 2020-04-15 NOTE — Progress Notes (Signed)
Safety precautions to be maintained throughout the outpatient stay will include: orient to surroundings, keep bed in low position, maintain call bell within reach at all times, provide assistance with transfer out of bed and ambulation.  

## 2020-04-15 NOTE — Patient Instructions (Signed)
____________________________________________________________________________________________ ° °Genicular Nerve Block ° °What is a genicular nerve block? °A genicular nerve block is the injection of a local anesthetic to block the nerves that transmits pain from the knee. ° °What is the purpose of a facet nerve block? °A genicular nerve block is a diagnostic procedure to determine if the pathologic changes (i.e. arthritis, meniscal tears, etc) and inflammation within the knee joint is the source of your knee pain. It also confirms that the knee pain will respond well to the actual treatment procedure. If a genicular nerve block works, it will give you relief for several hours. After that, the pain is expected to return to normal. This test is always performed twice (usually a week or two apart) because two successful tests are required to move onto treatment. If both diagnostic tests are positive, then we schedule a treatment called radiofrequency (RF) ablation. In this procedure, the same nerves are cauterized, which typically leads to pain relief for 4 -18 months. If this process works well for one knee, it can be performed on the other knee if needed. ° °How is the procedure performed? °You will be placed on the procedure table. The injection site is sterilized with either iodine or chlorhexadine. The site to be injected is numbed with a local anesthetic, and a needle is directed to the target area. X-ray guidance is used to ensure proper placement and positioning of the needle. When the needle is properly positioned near the genicular nerve, local anesthetic is injected to numb that nerve. This will be repeated at multiple sites around the knee to block all genicular nerves. ° °Will the procedure be painful? °The injection can be painful and we therefore provide the option of receiving IV sedation. IV sedation, combined with local anesthetic, can make the injection nearly pain free. It allows you to remain very  still during the procedure, which can also make the injection easier, faster, and more successful. If you decide to have IV sedation, you must have a driver to get you home safely afterwards. In addition, you cannot have anything to eat or drink within 8 hours of your appointment (clear liquids are allowed until 3 hours before the procedure). If you take medications for diabetes, these medications may need to be adjusted the morning of the procedure. Your primary care physician can help you with this adjustment. ° °What are the discharge instructions? °If you received IV sedation do not drive or operate machinery for at least 24 hours after the procedure. You may return to work the next day following your procedure. You may resume your normal diet immediately. Do not engage in any strenuous activity for 24 hours. You should, however, engage in moderate activity that typically causes your ususal pain. If the block works, those activities should not be painful for several hours after the injection. Do not take a bath, swim, or use a hot tub for 24 hours (you may take a shower). Call the office if you have any of the following: severe pain afterwards (different than your usual symptoms), redness/swelling/discharge at the injection site(s), fevers/chills, difficulty with bowel or bladder functions. ° °What are the risks and side effects? °The complication rate for this procedure is very low. Whenever a needle enters the skin, bleeding or infection can occur. Some other serious but extremely rare risks include paralysis and death. °You may have an allergic reaction to any of the medications used. If you have a known allergy to any medications, especially local anesthetics, notify   our staff before the procedure takes place. You may experience any of the following side effects up to 4 - 6 hours after the procedure: Leg muscle weakness or numbness may occur due to the local anesthetic affecting the nerves that control  your legs (this is a temporary affect and it is not paralysis). If you have any leg weakness or numbness, walk only with assistance in order to prevent falls and injury. Your leg strength will return slowly and completely. Dizziness may occur due to a decrease in your blood pressure. If this occurs, remain in a seated or lying position. Gradually sit up, and then stand after at least 10 minutes of sitting. Mild headaches may occur. Drink fluids and take pain medications if needed. If the headaches persist or become severe, call the office. Mild discomfort at the injection site can occur. This typically lasts for a few hours but can persist for a couple days. If this occurs, take anti-inflammatories or pain medications, apply ice to the area the day of the procedure. If it persists, apply moist heat in the day(s) following.  The side effects listed above can be normal. They are not dangerous and will resolve on their own. If, however, you experience any of the following, a complication may have occurred and you should either contact your doctor. If he is not readily available, then you should proceed to the closest urgent care center for evaluation: Severe or progressive pain at the injection site(s) Arm or leg weakness that progressively worsens or persists for longer than 8 hours Severe or progressive redness, swelling, or discharge from the injections site(s) Fevers, chills, nausea, or vomiting Bowel or bladder dysfunction (i.e. inability to urinate or pass stool or difficulty controlling either)  How long does it take for the procedure to work? You should feel relief from your usual pain within the first hour. Again, this is only expected to last for several hours, at the most. Remember, you may be sore in the middle part of your back from the needles, and you must distinguish this from your usual pain. ____________________________________________________________________________________________   Pain Management Discharge Instructions  General Discharge Instructions :  If you need to reach your doctor call: Monday-Friday 8:00 am - 4:00 pm at [phone] or toll free [phone].  After clinic hours [phone] to have operator reach doctor.  Bring all of your medication bottles to all your appointments in the pain clinic.  To cancel or reschedule your appointment with Pain Management please remember to call 24 hours in advance to avoid a fee.  Refer to the educational materials which you have been given on: General Risks, I had my Procedure. Discharge Instructions, Post Sedation.  Post Procedure Instructions:  The drugs you were given will stay in your system until tomorrow, so for the next 24 hours you should not drive, make any legal decisions or drink any alcoholic beverages.  You may eat anything you prefer, but it is better to start with liquids then soups and crackers, and gradually work up to solid foods.  Please notify your doctor immediately if you have any unusual bleeding, trouble breathing or pain that is not related to your normal pain.  Depending on the type of procedure that was done, some parts of your body may feel week and/or numb.  This usually clears up by tonight or the next day.  Walk with the use of an assistive device or accompanied by an adult for the 24 hours.  You may use ice   on the affected area for the first 24 hours.  Put ice in a Ziploc bag and cover with a towel and place against area 15 minutes on 15 minutes off.  You may switch to heat after 24 hours. 

## 2020-04-15 NOTE — Progress Notes (Signed)
Patient's Name: Anna Mccarthy  MRN: [id number]  Referring Provider: Maryland Pink, MD  DOB: [birth year]  PCP: Maryland Pink, MD  DOS: 04/15/2020  Note by: Gillis Santa, MD  Service setting: Ambulatory outpatient  Specialty: Interventional Pain Management  Patient type: Established  Location: [facility] (AMB) Pain Management Facility  Visit type: Interventional Procedure   Primary Reason for Visit: Interventional Pain Management Treatment. CC: Knee Pain (right)  Procedure:          Anesthesia, Analgesia, Anxiolysis:  Type: Genicular Nerves Block (Superior-lateral, Superior-medial, and Inferior-medial Genicular Nerves) #1  CPT: 00938      Primary Purpose: Diagnostic Region: Lateral, Anterior, and Medial aspects of the knee joint, above and below the knee joint proper. Level: Superior and inferior to the knee joint. Target Area: For Genicular Nerve block(s), the targets are: the superior-lateral genicular nerve, located in the lateral distal portion of the femoral shaft as it curves to form the lateral epicondyle, in the region of the distal femoral metaphysis; the superior-medial genicular nerve, located in the medial distal portion of the femoral shaft as it curves to form the medial epicondyle; and the inferior-medial genicular nerve, located in the medial, proximal portion of the tibial shaft, as it curves to form the medial epicondyle, in the region of the proximal tibial metaphysis. Approach: Anterior, percutaneous, ipsilateral approach. Laterality: Right knee Position: Modified Fowler's position with pillows under the targeted knee(s).  Type: Moderate (Conscious) Sedation combined with Local Anesthesia Indication(s): Analgesia and Anxiety Route: Intravenous (IV) IV Access: Secured Sedation: Meaningful verbal contact was maintained at all times during the procedure  Local Anesthetic: Lidocaine 1-2%   Indications: 1. Arthritis of right knee   2. Chronic pain of right knee   3. Chronic pain  syndrome    Pain Score: Pre-procedure: 9 /10 Post-procedure: 0-No pain/10  Pre-op Assessment:  Anna Mccarthy is a 64 y.o. (year old), female patient, seen today for interventional treatment. She  has a past surgical history that includes Tubal ligation (1984); Abdominal hysterectomy (1996); Hernia repair (2012); Colonoscopy; Knee arthroscopy with medial menisectomy (Left, 02/18/2016); Colonoscopy with propofol (N/A, 05/30/2016); and Breast biopsy (Left, 2011). Anna Mccarthy has a current medication list which includes the following prescription(s): alprazolam, duloxetine, escitalopram, fluocinonide, gabapentin, hydrochlorothiazide, hydroxychloroquine, omeprazole, tizanidine, alprazolam, b complex vitamins, omega-3 fatty acids, tramadol, turmeric, and vitamin e, and the following Facility-Administered Medications: fentanyl. Her primarily concern today is the Knee Pain (right)  Initial Vital Signs:  Pulse/HCG Rate: 76ECG Heart Rate: 62 Temp: (!) 97 F (36.1 C) Resp: 16 BP: (!) 148/90 SpO2: 98 %  BMI: Estimated body mass index is 30.11 kg/m as calculated from the following:   Height as of this encounter: 5\' 3"  (1.6 m).   Weight as of this encounter: 170 lb (77.1 kg).  Risk Assessment: Allergies: Reviewed. She is allergic to percocet [oxycodone-acetaminophen] and sulfa antibiotics.  Allergy Precautions: None required Coagulopathies: Reviewed. None identified.  Blood-thinner therapy: None at this time Active Infection(s): Reviewed. None identified. Anna Mccarthy is afebrile  Site Confirmation: Anna Mccarthy was asked to confirm the procedure and laterality before marking the site Procedure checklist: Completed Consent: Before the procedure and under the influence of no sedative(s), amnesic(s), or anxiolytics, the patient was informed of the treatment options, risks and possible complications. To fulfill our ethical and legal obligations, as recommended by the American Medical Association's Code of Ethics, I  have informed the patient of my clinical impression; the nature and purpose of the treatment or procedure; the risks,  benefits, and possible complications of the intervention; the alternatives, including doing nothing; the risk(s) and benefit(s) of the alternative treatment(s) or procedure(s); and the risk(s) and benefit(s) of doing nothing. The patient was provided information about the general risks and possible complications associated with the procedure. These may include, but are not limited to: failure to achieve desired goals, infection, bleeding, organ or nerve damage, allergic reactions, paralysis, and death. In addition, the patient was informed of those risks and complications associated to the procedure, such as failure to decrease pain; infection; bleeding; organ or nerve damage with subsequent damage to sensory, motor, and/or autonomic systems, resulting in permanent pain, numbness, and/or weakness of one or several areas of the body; allergic reactions; (i.e.: anaphylactic reaction); and/or death. Furthermore, the patient was informed of those risks and complications associated with the medications. These include, but are not limited to: allergic reactions (i.e.: anaphylactic or anaphylactoid reaction(s)); adrenal axis suppression; blood sugar elevation that in diabetics may result in ketoacidosis or comma; water retention that in patients with history of congestive heart failure may result in shortness of breath, pulmonary edema, and decompensation with resultant heart failure; weight gain; swelling or edema; medication-induced neural toxicity; particulate matter embolism and blood vessel occlusion with resultant organ, and/or nervous system infarction; and/or aseptic necrosis of one or more joints. Finally, the patient was informed that Medicine is not an exact science; therefore, there is also the possibility of unforeseen or unpredictable risks and/or possible complications that may result in  a catastrophic outcome. The patient indicated having understood very clearly. We have given the patient no guarantees and we have made no promises. Enough time was given to the patient to ask questions, all of which were answered to the patient's satisfaction. Ms. Capistran has indicated that she wanted to continue with the procedure. Attestation: I, the ordering provider, attest that I have discussed with the patient the benefits, risks, side-effects, alternatives, likelihood of achieving goals, and potential problems during recovery for the procedure that I have provided informed consent. Date  Time: 04/15/2020 10:12 AM  Pre-Procedure Preparation:  Monitoring: As per clinic protocol. Respiration, ETCO2, SpO2, BP, heart rate and rhythm monitor placed and checked for adequate function Safety Precautions: Patient was assessed for positional comfort and pressure points before starting the procedure. Time-out: I initiated and conducted the "Time-out" before starting the procedure, as per protocol. The patient was asked to participate by confirming the accuracy of the "Time Out" information. Verification of the correct person, site, and procedure were performed and confirmed by me, the nursing staff, and the patient. "Time-out" conducted as per Joint Commission's Universal Protocol (UP.01.01.01). Time: 1117  Description of Procedure:          Area Prepped: Entire knee area, from mid-thigh to mid-shin, lateral, anterior, and medial aspects. Prepping solution: ChloraPrep (2% chlorhexidine gluconate and 70% isopropyl alcohol) Safety Precautions: Aspiration looking for blood return was conducted prior to all injections. At no point did we inject any substances, as a needle was being advanced. No attempts were made at seeking any paresthesias. Safe injection practices and needle disposal techniques used. Medications properly checked for expiration dates. SDV (single dose vial) medications used. Latex Allergy  precautions taken.   Description of the Procedure: Protocol guidelines were followed. The patient was placed in position over the procedure table. The target area was identified and the area prepped in the usual manner. Skin & deeper tissues infiltrated with local anesthetic. Appropriate amount of time allowed to pass for local anesthetics  to take effect. The procedure needles were then advanced to the target area. Proper needle placement secured. Negative aspiration confirmed. Solution injected in intermittent fashion, asking for systemic symptoms every 0.5cc of injectate. The needles were then removed and the area cleansed, making sure to leave some of the prepping solution back to take advantage of its long term bactericidal properties.  Vitals:   04/15/20 1123 04/15/20 1127 04/15/20 1137 04/15/20 1147  BP: (!) 160/84 (!) 160/86 (!) 170/94 (!) 168/100  Pulse:      Resp: (!) 6  20 13   Temp:      TempSrc:      SpO2: 97% 96% 95% 97%  Weight:      Height:        Start Time: 1117 hrs. End Time: 1125 hrs. Materials:  Needle(s) Type: Regular needle Gauge: 22G Length: 3.5-in Medication(s): Please see orders for medications and dosing details. 6 cc solution made of 5 cc of 0.2% ropivacaine and 1 cc of Decadron, 10 mg/cc. 2 cc injected at each level above for right knee  Imaging Guidance (Non-Spinal):          Type of Imaging Technique: Fluoroscopy Guidance (Non-Spinal) Indication(s): Assistance in needle guidance and placement for procedures requiring needle placement in or near specific anatomical locations not easily accessible without such assistance. Exposure Time: Please see nurses notes. Contrast: Before injecting any contrast, we confirmed that the patient did not have an allergy to iodine, shellfish, or radiological contrast. Once satisfactory needle placement was completed at the desired level, radiological contrast was injected. Contrast injected under live fluoroscopy. No contrast  complications. See chart for type and volume of contrast used. Fluoroscopic Guidance: I was personally present during the use of fluoroscopy. "Tunnel Vision Technique" used to obtain the best possible view of the target area. Parallax error corrected before commencing the procedure. "Direction-depth-direction" technique used to introduce the needle under continuous pulsed fluoroscopy. Once target was reached, antero-posterior, oblique, and lateral fluoroscopic projection used confirm needle placement in all planes. Images permanently stored in EMR. Interpretation: I personally interpreted the imaging intraoperatively. Adequate needle placement confirmed in multiple planes. Appropriate spread of contrast into desired area was observed. No evidence of afferent or efferent intravascular uptake. Permanent images saved into the patient's record.  Antibiotic Prophylaxis:   Anti-infectives (From admission, onward)   None     Indication(s): None identified  Post-operative Assessment:  Post-procedure Vital Signs:  Pulse/HCG Rate: 7666 Temp: (!) 97 F (36.1 C) Resp: 13 BP: (!) 168/100 SpO2: 97 %  EBL: None  Complications: No immediate post-treatment complications observed by team, or reported by patient.  Note: The patient tolerated the entire procedure well. A repeat set of vitals were taken after the procedure and the patient was kept under observation following institutional policy, for this type of procedure. Post-procedural neurological assessment was performed, showing return to baseline, prior to discharge. The patient was provided with post-procedure discharge instructions, including a section on how to identify potential problems. Should any problems arise concerning this procedure, the patient was given instructions to immediately contact us, at any time, without hesitation. In any case, we plan to contact the patient by telephone for a follow-up status report regarding this interventional  procedure.  Comments:  No additional relevant information. 5 out of 5 strength bilateral lower extremity: Plantar flexion, dorsiflexion, knee flexion, knee extension. Plan of Care    Imaging Orders     DG PAIN CLINIC C-ARM 1-60 MIN NO REPORT Procedure Orders  No procedure(s) ordered today    Medications ordered for procedure: Meds ordered this encounter  Medications  . lidocaine (XYLOCAINE) 2 % (with pres) injection 400 mg  . fentaNYL (SUBLIMAZE) injection 25-50 mcg    Make sure Narcan is available in the pyxis when using this medication. In the event of respiratory depression (RR< 8/min): Titrate NARCAN (naloxone) in increments of 0.1 to 0.2 mg IV at 2-3 minute intervals, until desired degree of reversal.  . ropivacaine (PF) 2 mg/mL (0.2%) (NAROPIN) injection 9 mL  . dexamethasone (DECADRON) injection 10 mg   Medications administered: We administered lidocaine, fentaNYL, ropivacaine (PF) 2 mg/mL (0.2%), and dexamethasone.  See the medical record for exact dosing, route, and time of administration.  New Prescriptions   No medications on file   Disposition: Discharge home  Discharge Date & Time: 04/15/2020; 1200 hrs.   Physician-requested Follow-up: Return in about 2 weeks (around 04/29/2020) for Post Procedure Evaluation, virtual.  Future Appointments  Date Time Provider [place]  05/05/2020  2:40 PM [facility]  05/25/2020  3:00 PM Gillis Santa, MD [facility] None  07/16/2020  3:20 PM Gillis Santa, MD [facility] None   Primary Care Physician: Maryland Pink, MD Location: [facility] Facility Note by: Gillis Santa, MD Date: 04/15/2020; Time: 2:25 PM  Disclaimer:  Medicine is not an exact science. The only guarantee in medicine is that nothing is guaranteed. It is important to note that the decision to proceed with this intervention was based on the information collected from the patient. The Data and conclusions were drawn from the  patient's questionnaire, the interview, and the physical examination. Because the information was provided in large part by the patient, it cannot be guaranteed that it has not been purposely or unconsciously manipulated. Every effort has been made to obtain as much relevant data as possible for this evaluation. It is important to note that the conclusions that lead to this procedure are derived in large part from the available data. Always take into account that the treatment will also be dependent on availability of resources and existing treatment guidelines, considered by other Pain Management Practitioners as being common knowledge and practice, at the time of the intervention. For Medico-Legal purposes, it is also important to point out that variation in procedural techniques and pharmacological choices are the acceptable norm. The indications, contraindications, technique, and results of the above procedure should only be interpreted and judged by a Board-Certified Interventional Pain Specialist with extensive familiarity and expertise in the same exact procedure and technique.

## 2020-04-15 NOTE — Telephone Encounter (Signed)
Patient will need a virtual postprocedural evaluation in 2 weeks so I can document the pain relief from her right knee genicular nerve block that was done today.  After that virtual visit, I will place an order for bilateral genicular nerve radiofrequency ablation which can be done on the same day as long as you block 1 hour.

## 2020-04-15 NOTE — Telephone Encounter (Signed)
Patient called stating she was supposed to cancel virtual apptointments and schedule procedures on her knee. Please advise what is needed for this patient.

## 2020-04-16 ENCOUNTER — Telehealth: Payer: Self-pay

## 2020-04-16 NOTE — Telephone Encounter (Signed)
Post procedure phone call.  Patient states she is doing well.  

## 2020-04-17 ENCOUNTER — Encounter: Payer: Self-pay | Admitting: Student in an Organized Health Care Education/Training Program

## 2020-04-17 DIAGNOSIS — M25562 Pain in left knee: Secondary | ICD-10-CM

## 2020-04-17 DIAGNOSIS — G8929 Other chronic pain: Secondary | ICD-10-CM

## 2020-04-20 ENCOUNTER — Telehealth: Payer: Self-pay | Admitting: *Deleted

## 2020-04-20 MED ORDER — GABAPENTIN 100 MG PO CAPS: 200.0000 mg | ORAL_CAPSULE | Freq: Three times a day (TID) | ORAL | 0 refills | Status: AC

## 2020-04-20 NOTE — Addendum Note (Signed)
Addended by: Gillis Santa on: 04/20/2020 08:57 AM   Modules accepted: Orders

## 2020-04-20 NOTE — Telephone Encounter (Signed)
Patient informed that an order for PT has been placed. She does not have Tramadol, will call for appt if she decides to continue to take it.

## 2020-04-20 NOTE — Telephone Encounter (Signed)
Increase Gabapentin to 200 mg three times a day, referral for PT ordered.  Please call pt and see if she needs another Rx for Gabapentin.  Does she not have any Tramadol remaining? If she needs a Rx for Tramadol, she'll need to come in as I need to get a baseline urine tox screen as she hasn't done one before.

## 2020-04-28 ENCOUNTER — Encounter: Payer: Self-pay | Admitting: Student in an Organized Health Care Education/Training Program

## 2020-04-29 ENCOUNTER — Ambulatory Visit
Payer: Medicare Other | Attending: Student in an Organized Health Care Education/Training Program | Admitting: Student in an Organized Health Care Education/Training Program

## 2020-04-29 ENCOUNTER — Other Ambulatory Visit: Payer: Self-pay

## 2020-04-29 ENCOUNTER — Encounter: Payer: Self-pay | Admitting: Student in an Organized Health Care Education/Training Program

## 2020-04-29 DIAGNOSIS — M25562 Pain in left knee: Secondary | ICD-10-CM

## 2020-04-29 DIAGNOSIS — M1711 Unilateral primary osteoarthritis, right knee: Secondary | ICD-10-CM | POA: Diagnosis not present

## 2020-04-29 DIAGNOSIS — M25561 Pain in right knee: Secondary | ICD-10-CM

## 2020-04-29 DIAGNOSIS — M1712 Unilateral primary osteoarthritis, left knee: Secondary | ICD-10-CM

## 2020-04-29 DIAGNOSIS — G894 Chronic pain syndrome: Secondary | ICD-10-CM | POA: Diagnosis not present

## 2020-04-29 DIAGNOSIS — G8929 Other chronic pain: Secondary | ICD-10-CM

## 2020-04-29 DIAGNOSIS — M23307 Other meniscus derangements, unspecified meniscus, left knee: Secondary | ICD-10-CM

## 2020-04-29 NOTE — Progress Notes (Signed)
Patient: Anna Mccarthy  Service Category: E/M  Provider: Gillis Santa, MD  DOB: 07-22-1955  DOS: 04/29/2020  Location: Office  MRN: [id number]  Setting: Ambulatory outpatient  Referring Provider: Maryland Pink, MD  Type: Established Patient  Specialty: Interventional Pain Management  PCP: Maryland Pink, MD  Location: Home  Delivery: TeleHealth     Virtual Encounter - Pain Management PROVIDER NOTE: Information contained herein reflects review and annotations entered in association with encounter. Interpretation of such information and data should be left to medically-trained personnel. Information provided to patient can be located elsewhere in the medical record under "Patient Instructions". Document created using STT-dictation technology, any transcriptional errors that may result from process are unintentional.    Contact & Pharmacy Preferred: [phone] Home: [phone] (home) Mobile: [phone] (mobile) E-mail: [email or web address] [address], [place] [address] Phone: [phone] Fax: [phone]  [place], [place] [address] [place] [address] Phone: [phone] Fax: [phone]  [place], [place], [address] [place], [address] [place] [address] Phone: [phone] Fax: [phone]   Pre-screening  Anna Mccarthy offered "in-person" vs "virtual" encounter. She indicated preferring virtual for this encounter.   Reason COVID-19*   Social distancing based on CDC and AMA recommendations.   I contacted Anna Mccarthy on 04/29/2020 via video conference.      I clearly identified myself as Gillis Santa, MD. I verified that I was speaking with the correct person using two identifiers (Name: Anna Mccarthy, and date of birth:  03-31-1956).  Consent I sought verbal advanced consent from Anna Mccarthy for virtual visit interactions. I informed Anna Mccarthy of possible security and privacy concerns, risks, and limitations associated with providing "not-in-person" medical evaluation and management services. I also informed Anna Mccarthy of the availability of "in-person" appointments. Finally, I informed her that there would be a charge for the virtual visit and that she could be  personally, fully or partially, financially responsible for it. Anna Mccarthy expressed understanding and agreed to proceed.   Historic Elements   Anna Mccarthy is a 64 y.o. year old, female patient evaluated today after our last contact on 04/15/2020. Anna Mccarthy  has a past medical history of Allergy, Anxiety, Arthritis, Bronchitis, Cancer ([place]), GERD (gastroesophageal reflux disease), and Hypertension. She also  has a past surgical history that includes Tubal ligation (1984); Abdominal hysterectomy (1996); Hernia repair (2012); Colonoscopy; Knee arthroscopy with medial menisectomy (Left, 02/18/2016); Colonoscopy with propofol (N/A, 05/30/2016); and Breast biopsy (Left, 2011). Anna Mccarthy has a current medication list which includes the following prescription(s): alprazolam, duloxetine, escitalopram, fluocinonide, gabapentin, hydrochlorothiazide, hydroxychloroquine, omeprazole, tizanidine, alprazolam, b complex vitamins, omega-3 fatty acids, tramadol, turmeric, and vitamin e. She  reports that she has never smoked. She has never used smokeless tobacco. She reports current alcohol use of about 2.0 standard drinks of alcohol per week. She reports that she does not use drugs. Anna Mccarthy is allergic to percocet [oxycodone-acetaminophen] and sulfa antibiotics.   HPI  Today, she is being contacted for a post-procedure assessment.   Post-Procedure Evaluation  Procedure (04/15/2020): Right knee genicular nerve block  Sedation: Please see nurses note.  Effectiveness  during initial hour after procedure(Ultra-Short Term Relief): 100 %  Local anesthetic used: Long-acting (4-6 hours) Effectiveness: Defined as  any analgesic benefit obtained secondary to the administration of local anesthetics. This carries significant diagnostic value as to the etiological location, or anatomical origin, of the pain. Duration of benefit is expected to coincide with the duration of the local anesthetic used.  Effectiveness during initial 4-6 hours after procedure(Short-Term Relief): 100 %   Long-term benefit: Defined as any relief past the pharmacologic duration of the local anesthetics.  Effectiveness past the initial 6 hours after procedure(Long-Term Relief): 90 %   Current benefits: Defined as benefit that persist at this time.   Analgesia:  >50% relief Function: Anna Mccarthy reports improvement in function ROM: Back to baseline    Laboratory Chemistry Profile   Renal Lab Results  Component Value Date   BUN 18 10/07/2014   CREATININE 0.86 10/07/2014   GFRAA >60 10/07/2014   GFRNONAA >60 10/07/2014     Hepatic Lab Results  Component Value Date   AST 24 10/07/2014   ALT 20 10/07/2014   ALBUMIN 4.4 10/07/2014   ALKPHOS 78 10/07/2014     Electrolytes Lab Results  Component Value Date   NA 135 10/07/2014   K 3.7 10/07/2014   CL 99 (L) 10/07/2014   CALCIUM 9.0 10/07/2014     Bone No results found for: VD25OH, VD125OH2TOT, NL8921JH4, RD4081KG8, 25OHVITD1, 25OHVITD2, 25OHVITD3, TESTOFREE, TESTOSTERONE   Inflammation (CRP: Acute Phase) (ESR: Chronic Phase) No results found for: CRP, ESRSEDRATE, LATICACIDVEN     Note: Above Lab results reviewed.   Assessment  The primary encounter diagnosis was Chronic pain of right knee. Diagnoses of Chronic pain of left knee, Arthritis of right knee, Chronic pain syndrome, Primary osteoarthritis of left knee, and Meniscus degeneration, left were also pertinent to this visit.  Plan of Care   Ms. Anna Mccarthy has a  current medication list which includes the following long-term medication(s): duloxetine, escitalopram, gabapentin, hydrochlorothiazide, and omeprazole.  Status post right knee genicular nerve block which provided greater than 50% pain relief for 5 days and improvement in functional status.  States that the pain relief is now decreasing and wearing off.  Plan for bilateral knee genicular nerve radiofrequency ablation on Monday.  Of note patient has had her left knee RFA done on 2 occasions, with her most recent ablation procedure on 11/11/2019.  We will plan on doing bilateral genicular nerve radiofrequency ablation under fluoroscopy with moderate sedation.  Risks and benefits reviewed and patient would like to proceed.   Orders:  Orders Placed This Encounter  Procedures   Radiofrequency,Genicular    Standing Status:   Future    Standing Expiration Date:   10/27/2020    Scheduling Instructions:     Side(s): Bilateral Knee     Level(s): Superior-Lateral, Superior-Medial, and Inferior-Medial Genicular Nerve(s)     Sedation: with     Nov 7    Order Specific Question:   Where will this procedure be performed?    Answer:   [facility]   Follow-up plan:   Return in about 5 days (around 05/04/2020) for Bilateral genicular nerve RFA, with sedation.     Status post left genicular RFA 07/30/2018 which provided significant pain relief for approximately 1 year now, repeat on 11/11/2019: Therapeutic.  Right knee Hyalgan injection #1 on 02/03/2020.  #2 02/24/2020.  Status post right genicular nerve block on 04/15/2020.  Plan for bilateral knee genicular radiofrequency ablation on Monday.     Recent Visits Date Type Provider Dept  04/15/20 Procedure visit Gillis Santa, MD [facility]  04/13/20 Telemedicine Gillis Santa, MD [facility]  03/30/20 Procedure visit Gillis Santa, MD [facility]  03/23/20 Telemedicine Gillis Santa, MD [facility]  02/24/20 Procedure  visit Gillis Santa, MD [facility]  02/03/20 Procedure visit Gillis Santa, MD [facility]  01/30/20 Office Visit Gillis Santa, MD [facility]  Showing recent visits within past 90 days and meeting all other requirements Today's Visits Date Type Provider Dept  04/29/20 Telemedicine Gillis Santa, MD [facility]  Showing today's visits and meeting all other requirements Future Appointments Date Type Provider Dept  05/04/20 Appointment Gillis Santa, MD [facility]  Showing future appointments within next 90 days and meeting all other requirements  I discussed the assessment and treatment plan with the patient. The patient was provided an opportunity to ask questions and all were answered. The patient agreed with the plan and demonstrated an understanding of the instructions.  Patient advised to call back or seek an in-person evaluation if the symptoms or condition worsens.  Duration of encounter: 20 minutes.  Note by: Gillis Santa, MD Date: 04/29/2020; Time: 3:48 PM

## 2020-05-04 ENCOUNTER — Ambulatory Visit (HOSPITAL_BASED_OUTPATIENT_CLINIC_OR_DEPARTMENT_OTHER): Payer: Medicare Other | Admitting: Student in an Organized Health Care Education/Training Program

## 2020-05-04 ENCOUNTER — Other Ambulatory Visit: Payer: Self-pay

## 2020-05-04 ENCOUNTER — Encounter: Payer: Self-pay | Admitting: Student in an Organized Health Care Education/Training Program

## 2020-05-04 ENCOUNTER — Ambulatory Visit
Admission: RE | Admit: 2020-05-04 | Discharge: 2020-05-04 | Disposition: A | Discharge status: Home / Self Care | Payer: Medicare Other | Source: Ambulatory Visit | Attending: Student in an Organized Health Care Education/Training Program | Admitting: Student in an Organized Health Care Education/Training Program

## 2020-05-04 DIAGNOSIS — G8929 Other chronic pain: Secondary | ICD-10-CM | POA: Diagnosis present

## 2020-05-04 DIAGNOSIS — G894 Chronic pain syndrome: Secondary | ICD-10-CM | POA: Insufficient documentation

## 2020-05-04 DIAGNOSIS — M23307 Other meniscus derangements, unspecified meniscus, left knee: Secondary | ICD-10-CM

## 2020-05-04 DIAGNOSIS — M1712 Unilateral primary osteoarthritis, left knee: Secondary | ICD-10-CM

## 2020-05-04 DIAGNOSIS — M1711 Unilateral primary osteoarthritis, right knee: Secondary | ICD-10-CM

## 2020-05-04 DIAGNOSIS — M25562 Pain in left knee: Secondary | ICD-10-CM | POA: Insufficient documentation

## 2020-05-04 MED ORDER — ROPIVACAINE HCL 2 MG/ML IJ SOLN
9.0000 mL | Freq: Once | INTRAMUSCULAR | Status: AC
  Administered 2020-05-04: 10 mL via PERINEURAL
  Filled 2020-05-04: qty 10

## 2020-05-04 MED ORDER — FENTANYL CITRATE (PF) 100 MCG/2ML IJ SOLN
INTRAMUSCULAR | Status: AC
  Filled 2020-05-04: qty 2

## 2020-05-04 MED ORDER — LIDOCAINE HCL 2 % IJ SOLN
20.0000 mL | Freq: Once | INTRAMUSCULAR | Status: AC
  Administered 2020-05-04: 400 mg

## 2020-05-04 MED ORDER — DEXAMETHASONE SODIUM PHOSPHATE 10 MG/ML IJ SOLN
10.0000 mg | Freq: Once | INTRAMUSCULAR | Status: AC
  Administered 2020-05-04: 10 mg
  Filled 2020-05-04: qty 1

## 2020-05-04 MED ORDER — LIDOCAINE HCL 2 % IJ SOLN
INTRAMUSCULAR | Status: AC
  Filled 2020-05-04: qty 20

## 2020-05-04 MED ORDER — DEXAMETHASONE SODIUM PHOSPHATE 10 MG/ML IJ SOLN
INTRAMUSCULAR | Status: AC
  Filled 2020-05-04: qty 1

## 2020-05-04 MED ORDER — ROPIVACAINE HCL 2 MG/ML IJ SOLN
INTRAMUSCULAR | Status: AC
  Filled 2020-05-04: qty 10

## 2020-05-04 MED ORDER — DEXAMETHASONE SODIUM PHOSPHATE 10 MG/ML IJ SOLN
10.0000 mg | Freq: Once | INTRAMUSCULAR | Status: AC
  Administered 2020-05-04: 10 mg

## 2020-05-04 MED ORDER — FENTANYL CITRATE (PF) 100 MCG/2ML IJ SOLN
25.0000 ug | INTRAMUSCULAR | Status: DC | PRN
  Administered 2020-05-04: 100 ug via INTRAVENOUS

## 2020-05-04 NOTE — Progress Notes (Signed)
Patient's Name: Anna Mccarthy  MRN: [id number]  Referring Provider: Gillis Santa, MD  DOB: Nov 01, 1955  PCP: Maryland Pink, MD  DOS: 05/04/2020  Note by: Gillis Santa, MD  Service setting: Ambulatory outpatient  Specialty: Interventional Pain Management  Patient type: Established  Location: [facility] (AMB) Pain Management Facility  Visit type: Interventional Procedure   Primary Reason for Visit: Interventional Pain Management Treatment. CC: Knee Pain (bilateral )  Procedure:          Anesthesia, Analgesia, Anxiolysis:  Type: Therapeutic Superior-lateral, Superior-medial, and Inferior-medial, Genicular Nerve Radiofrequency Ablation. (#2 on left, #1 on right) Region: Lateral, Anterior, and Medial aspects of the knee joint, above and below the knee joint proper. Level: Superior and inferior to the knee joint. Laterality: Bilateral  Type: Moderate (Conscious) Sedation combined with Local Anesthesia Indication(s): Analgesia and Anxiety Route: Intravenous (IV) IV Access: Secured Sedation: Meaningful verbal contact was maintained at all times during the procedure  Local Anesthetic: Lidocaine 1-2%  Position: Supine   Indications: 1. Chronic pain of left knee   2. Primary osteoarthritis of left knee   3. Meniscus degeneration, left   4. Arthritis of right knee   5. Chronic pain syndrome    Anna Mccarthy has been dealing with the above chronic pain for longer than three months and has either failed to respond, was unable to tolerate, or simply did not get enough benefit from other more conservative therapies including, but not limited to: 1. Over-the-counter medications 2. Anti-inflammatory medications 3. Muscle relaxants 4. Membrane stabilizers 5. Opioids 6. Physical therapy and/or chiropractic manipulation 7. Modalities (Heat, ice, etc.) 8. Invasive techniques such as nerve blocks. Anna Mccarthy has attained more than 50% relief of the pain from a series of diagnostic injections conducted in  separate occasions.  Pain Score: Pre-procedure: 4 /10 Post-procedure: 0-No pain/10  Pre-op Assessment:  Anna Mccarthy is a 64 y.o. (year old), female patient, seen today for interventional treatment. She  has a past surgical history that includes Tubal ligation (1984); Abdominal hysterectomy (1996); Hernia repair (2012); Colonoscopy; Knee arthroscopy with medial menisectomy (Left, 02/18/2016); Colonoscopy with propofol (N/A, 05/30/2016); and Breast biopsy (Left, 2011). Anna Mccarthy has a current medication list which includes the following prescription(s): alprazolam, duloxetine, escitalopram, fluocinonide, gabapentin, hydrochlorothiazide, hydroxychloroquine, omeprazole, tizanidine, alprazolam, b complex vitamins, omega-3 fatty acids, tramadol, turmeric, and vitamin e, and the following Facility-Administered Medications: fentanyl. Her primarily concern today is the Knee Pain (bilateral )  Initial Vital Signs:  Pulse/HCG Rate: 75ECG Heart Rate: 65 Temp: (!) 97.3 F (36.3 C) Resp: 16 BP: (!) 141/83 SpO2: 98 %  BMI: Estimated body mass index is 30.11 kg/m as calculated from the following:   Height as of this encounter: 5\' 3"  (1.6 m).   Weight as of this encounter: 170 lb (77.1 kg).  Risk Assessment: Allergies: Reviewed. She is allergic to percocet [oxycodone-acetaminophen] and sulfa antibiotics.  Allergy Precautions: None required Coagulopathies: Reviewed. None identified.  Blood-thinner therapy: None at this time Active Infection(s): Reviewed. None identified. Anna Mccarthy is afebrile  Site Confirmation: Anna Mccarthy was asked to confirm the procedure and laterality before marking the site Procedure checklist: Completed Consent: Before the procedure and under the influence of no sedative(s), amnesic(s), or anxiolytics, the patient was informed of the treatment options, risks and possible complications. To fulfill our ethical and legal obligations, as recommended by the American Medical Association's  Code of Ethics, I have informed the patient of my clinical impression; the nature and purpose of the treatment or procedure; the  risks, benefits, and possible complications of the intervention; the alternatives, including doing nothing; the risk(s) and benefit(s) of the alternative treatment(s) or procedure(s); and the risk(s) and benefit(s) of doing nothing. The patient was provided information about the general risks and possible complications associated with the procedure. These may include, but are not limited to: failure to achieve desired goals, infection, bleeding, organ or nerve damage, allergic reactions, paralysis, and death. In addition, the patient was informed of those risks and complications associated to the procedure, such as failure to decrease pain; infection; bleeding; organ or nerve damage with subsequent damage to sensory, motor, and/or autonomic systems, resulting in permanent pain, numbness, and/or weakness of one or several areas of the body; allergic reactions; (i.e.: anaphylactic reaction); and/or death. Furthermore, the patient was informed of those risks and complications associated with the medications. These include, but are not limited to: allergic reactions (i.e.: anaphylactic or anaphylactoid reaction(s)); adrenal axis suppression; blood sugar elevation that in diabetics may result in ketoacidosis or comma; water retention that in patients with history of congestive heart failure may result in shortness of breath, pulmonary edema, and decompensation with resultant heart failure; weight gain; swelling or edema; medication-induced neural toxicity; particulate matter embolism and blood vessel occlusion with resultant organ, and/or nervous system infarction; and/or aseptic necrosis of one or more joints. Finally, the patient was informed that Medicine is not an exact science; therefore, there is also the possibility of unforeseen or unpredictable risks and/or possible complications  that may result in a catastrophic outcome. The patient indicated having understood very clearly. We have given the patient no guarantees and we have made no promises. Enough time was given to the patient to ask questions, all of which were answered to the patient's satisfaction. Ms. Miggins has indicated that she wanted to continue with the procedure. Attestation: I, the ordering provider, attest that I have discussed with the patient the benefits, risks, side-effects, alternatives, likelihood of achieving goals, and potential problems during recovery for the procedure that I have provided informed consent. Date  Time: 05/04/2020  9:47 AM  Pre-Procedure Preparation:  Monitoring: As per clinic protocol. Respiration, ETCO2, SpO2, BP, heart rate and rhythm monitor placed and checked for adequate function Safety Precautions: Patient was assessed for positional comfort and pressure points before starting the procedure. Time-out: I initiated and conducted the "Time-out" before starting the procedure, as per protocol. The patient was asked to participate by confirming the accuracy of the "Time Out" information. Verification of the correct person, site, and procedure were performed and confirmed by me, the nursing staff, and the patient. "Time-out" conducted as per Joint Commission's Universal Protocol (UP.01.01.01). Time: 1025  Description of Procedure:          Target Area: For Genicular Nerve block(s), the targets are: the superior-lateral genicular nerve, located in the lateral distal portion of the femoral shaft as it curves to form the lateral epicondyle, in the region of the distal femoral metaphysis; the superior-medial genicular nerve, located in the medial distal portion of the femoral shaft as it curves to form the medial epicondyle; and the inferior-medial genicular nerve, located in the medial, proximal portion of the tibial shaft, as it curves to form the medial epicondyle, in the region of the  proximal tibial metaphysis. Approach: Anterior, ipsilateral approach. Area Prepped: Entire knee area, from mid-thigh to mid-shin, lateral, anterior, and medial aspects. Prepping solution: Hibiclens (4.0% Chlorhexidine gluconate solution) Safety Precautions: Aspiration looking for blood return was conducted prior to all injections. At no  point did we inject any substances, as a needle was being advanced. No attempts were made at seeking any paresthesias. Safe injection practices and needle disposal techniques used. Medications properly checked for expiration dates. SDV (single dose vial) medications used. Description of the Procedure: Protocol guidelines were followed. The patient was placed in position over the procedure table. The target area was identified and the area prepped in the usual manner. The skin and muscle were infiltrated with local anesthetic. Appropriate amount of time allowed to pass for local anesthetics to take effect. Radiofrequency needles were introduced to the target area using fluoroscopic guidance. Using the NeuroTherm NT1100 Radiofrequency Generator, sensory stimulation using 50 Hz was used to locate & identify the nerve, making sure that the needle was positioned such that there was no sensory stimulation below 0.3 V or above 0.7 V. Stimulation using 2 Hz was used to evaluate the motor component. Care was taken not to lesion any nerves that demonstrated motor stimulation of the lower extremities at an output of less than 2.5 times that of the sensory threshold, or a maximum of 2.0 V. Once satisfactory placement of the needles was achieved, the numbing solution was slowly injected after negative aspiration. After waiting for at least 2 minutes, the ablation was performed at 80 degrees C for 60 seconds, using regular Radiofrequency settings. Once the procedure was completed, the needles were then removed and the area cleansed, making sure to leave some of the prepping solution back to  take advantage of its long term bactericidal properties. Intra-operative Compliance: Compliant Vitals:   05/04/20 1058 05/04/20 1108 05/04/20 1118 05/04/20 1128  BP: (!) 178/103 (!) 163/87 (!) 164/87 (!) 151/81  Pulse:      Resp: 11 18 15 16   Temp:  97.7 F (36.5 C)  97.6 F (36.4 C)  TempSrc:      SpO2: 100% 98% 100% 100%  Weight:      Height:        Start Time: 1025 hrs. End Time: 1058 hrs. Materials & Medications:  Needle(s) Type: Teflon-coated, curved tip, Radiofrequency needle(s) Gauge: 22G Length: 10cm Medication(s): Please see orders for medications and dosing details. 5 cc solution made of 4 cc of 0.2% ropivacaine, 1 cc of Decadron 10 mg/cc.  1.5 cc injected at each level post ablation for right knee. 5 cc solution made of 4 cc of 0.2% ropivacaine, 1 cc of Decadron 10 mg/cc.  1.5 cc injected at each level post ablation for left knee. Imaging Guidance (Non-Spinal):          Type of Imaging Technique: Fluoroscopy Guidance (Non-Spinal) Indication(s): Assistance in needle guidance and placement for procedures requiring needle placement in or near specific anatomical locations not easily accessible without such assistance. Exposure Time: Please see nurses notes. Contrast: Before injecting any contrast, we confirmed that the patient did not have an allergy to iodine, shellfish, or radiological contrast. Once satisfactory needle placement was completed at the desired level, radiological contrast was injected. Contrast injected under live fluoroscopy. No contrast complications. See chart for type and volume of contrast used. Fluoroscopic Guidance: I was personally present during the use of fluoroscopy. "Tunnel Vision Technique" used to obtain the best possible view of the target area. Parallax error corrected before commencing the procedure. "Direction-depth-direction" technique used to introduce the needle under continuous pulsed fluoroscopy. Once target was reached, antero-posterior,  oblique, and lateral fluoroscopic projection used confirm needle placement in all planes. Images permanently stored in EMR. Interpretation: I personally interpreted the imaging intraoperatively.  Adequate needle placement confirmed in multiple planes. Appropriate spread of contrast into desired area was observed. No evidence of afferent or efferent intravascular uptake. Permanent images saved into the patient's record.  Antibiotic Prophylaxis:   Anti-infectives (From admission, onward)   None     Indication(s): None identified  Post-operative Assessment:  Post-procedure Vital Signs:  Pulse/HCG Rate: 7568 Temp: 97.6 F (36.4 C) Resp: 16 BP: (!) 151/81 SpO2: 100 %  EBL: None  Complications: No immediate post-treatment complications observed by team, or reported by patient.  Note: The patient tolerated the entire procedure well. A repeat set of vitals were taken after the procedure and the patient was kept under observation following institutional policy, for this type of procedure. Post-procedural neurological assessment was performed, showing return to baseline, prior to discharge. The patient was provided with post-procedure discharge instructions, including a section on how to identify potential problems. Should any problems arise concerning this procedure, the patient was given instructions to immediately contact us, at any time, without hesitation. In any case, we plan to contact the patient by telephone for a follow-up status report regarding this interventional procedure.  Comments:  No additional relevant information.  Plan of Care    Imaging Orders     DG PAIN CLIN[facility] C-ARM 1-60 MIN NO REPORT Procedure Orders    No procedure(s) ordered today    Medications ordered for procedure: Meds ordered this encounter  Medications  . lidocaine (XYLOCAINE) 2 % (with pres) injection 400 mg  . fentaNYL (SUBLIMAZE) injection 25-50 mcg    Make sure Narcan is available in the pyxis when  using this medication. In the event of respiratory depression (RR< 8/min): Titrate NARCAN (naloxone) in increments of 0.1 to 0.2 mg IV at 2-3 minute intervals, until desired degree of reversal.  . ropivacaine (PF) 2 mg/mL (0.2%) (NAROPIN) injection 9 mL  . dexamethasone (DECADRON) injection 10 mg  . dexamethasone (DECADRON) injection 10 mg   Medications administered: We administered lidocaine, fentaNYL, ropivacaine (PF) 2 mg/mL (0.2%), dexamethasone, and dexamethasone.  See the medical record for exact dosing, route, and time of administration.  Disposition: Discharge home  Discharge Date & Time: 05/04/2020; 1129 hrs.   Physician-requested Follow-up: Return in about 7 weeks (around 06/22/2020) for Post Procedure Evaluation, virtual.  Future Appointments  Date Time Provider [address]  05/05/2020  2:40 PM [facility]  06/18/2020  3:20 PM Gillis Santa, MD [place] None   Primary Care Physician: [place] Pink, MD Location: [facility] Note by: Gillis Santa, MD Date: 05/04/2020; Time: 12:01 PM  Disclaimer:  Medicine is not an exact science. The only guarantee in medicine is that nothing is guaranteed. It is important to note that the decision to proceed with this intervention was based on the information collected from the patient. The Data and conclusions were drawn from the patient's questionnaire, the interview, and the physical examination. Because the information was provided in large part by the patient, it cannot be guaranteed that it has not been purposely or unconsciously manipulated. Every effort has been made to obtain as much relevant data as possible for this evaluation. It is important to note that the conclusions that lead to this procedure are derived in large part from the available data. Always take into account that the treatment will also be dependent on availability of resources and existing treatment guidelines, considered by  other Pain Management Practitioners as being common knowledge and practice, at the time of the intervention. For Medico-Legal purposes,  it is also important to point out that variation in procedural techniques and pharmacological choices are the acceptable norm. The indications, contraindications, technique, and results of the above procedure should only be interpreted and judged by a Board-Certified Interventional Pain Specialist with extensive familiarity and expertise in the same exact procedure and technique.

## 2020-05-04 NOTE — Patient Instructions (Signed)

## 2020-05-04 NOTE — Progress Notes (Signed)
Safety precautions to be maintained throughout the outpatient stay will include: orient to surroundings, keep bed in low position, maintain call bell within reach at all times, provide assistance with transfer out of bed and ambulation.  

## 2020-05-05 ENCOUNTER — Ambulatory Visit
Admission: RE | Admit: 2020-05-05 | Discharge: 2020-05-05 | Disposition: A | Discharge status: Home / Self Care | Payer: Medicare Other | Source: Ambulatory Visit | Attending: Family Medicine | Admitting: Family Medicine

## 2020-05-05 ENCOUNTER — Telehealth: Payer: Self-pay

## 2020-05-05 DIAGNOSIS — Z1231 Encounter for screening mammogram for malignant neoplasm of breast: Secondary | ICD-10-CM

## 2020-05-05 NOTE — Telephone Encounter (Signed)
Called PP , Denies any needs at this time. Instructed to call if needed. 

## 2020-05-25 ENCOUNTER — Telehealth: Payer: Medicare Other | Admitting: Student in an Organized Health Care Education/Training Program

## 2020-06-17 ENCOUNTER — Ambulatory Visit: Payer: Medicare Other | Admitting: Student in an Organized Health Care Education/Training Program

## 2020-06-17 NOTE — Progress Notes (Addendum)
Patient: Anna Mccarthy  Service Category: E/M  Provider: Gillis Santa, MD  DOB: 02/15/1956  DOS: 06/18/2020  Location: Office  MRN: [id number]  Setting: Ambulatory outpatient  Referring Provider: Maryland Pink, MD  Type: Established Patient  Specialty: Interventional Pain Management  PCP: Maryland Pink, MD  Location: Home  Delivery: TeleHealth     Virtual Encounter - Pain Management PROVIDER NOTE: Information contained herein reflects review and annotations entered in association with encounter. Interpretation of such information and data should be left to medically-trained personnel. Information provided to patient can be located elsewhere in the medical record under "Patient Instructions". Document created using STT-dictation technology, any transcriptional errors that may result from process are unintentional.    Contact & Pharmacy Preferred: [place]: [phone] (home) Mobile: [phone] (mobile) E-mail: [email or web address] .[id number] [address], [address] [place] [address] Phone: [phone] Fax: [phone]  [place], [place] [address] [place] [address] Phone: [phone] Fax: [phone]  [place], [place], [address] [place], [place] [address] [place] [address] Phone: [phone] Fax: [phone]   Pre-screening  Anna Mccarthy offered "in-person" vs "virtual" encounter. She indicated preferring virtual for this encounter.   Reason COVID-19*  Social distancing based on CDC and AMA recommendations.   I contacted Anna Mccarthy on 06/18/2020 via telephone.      I clearly identified myself as Gillis Santa, MD. I verified that I was speaking with the correct person using two identifiers (Name: Anna Mccarthy, and date of birth: 31-Jan-1956).  Consent I  sought verbal advanced consent from Anna Mccarthy for virtual visit interactions. I informed Anna Mccarthy of possible security and privacy concerns, risks, and limitations associated with providing "not-in-person" medical evaluation and management services. I also informed Anna Mccarthy of the availability of "in-person" appointments. Finally, I informed her that there would be a charge for the virtual visit and that she could be  personally, fully or partially, financially responsible for it. Anna Mccarthy expressed understanding and agreed to proceed.   Historic Elements   Anna Mccarthy is a 64 y.o. year old, female patient evaluated today after our last contact on 05/04/2020. Anna Mccarthy  has a past medical history of Allergy, Anxiety, Arthritis, Bronchitis, Cancer ([place]), GERD (gastroesophageal reflux disease), and Hypertension. She also  has a past surgical history that includes Tubal ligation (1984); Abdominal hysterectomy (1996); Hernia repair (2012); Colonoscopy; Knee arthroscopy with medial menisectomy (Left, 02/18/2016); Colonoscopy with propofol (N/A, 05/30/2016); and Breast biopsy (Left, 2011). Anna Mccarthy has a current medication list which includes the following prescription(s): alprazolam, alprazolam, duloxetine, escitalopram, fluocinonide, gabapentin, hydrochlorothiazide, hydroxychloroquine, omeprazole, and tizanidine. She  reports that she has never smoked. She has never used smokeless tobacco. She reports current alcohol use of about 2.0 standard drinks of alcohol per week. She reports that she does not use drugs. Anna Mccarthy is allergic to percocet [oxycodone-acetaminophen] and sulfa antibiotics.   HPI  Today, she is being contacted for a post-procedure assessment.   Post-Procedure Evaluation  Procedure (05/04/2020):   Type: Therapeutic Superior-lateral, Superior-medial, and Inferior-medial, Genicular Nerve Radiofrequency Ablation. (#2 on left, #1 on right) Region: Lateral, Anterior, and Medial aspects  of the knee joint, above and below the knee joint  Level: Superior and inferior to the knee joint. Laterality:  Bilateral  Sedation: Please see nurses note.  Effectiveness during initial hour after procedure(Ultra-Mccarthy Term Relief): 100 %   Local anesthetic used: Long-acting (4-6 hours) Effectiveness: Defined as any analgesic benefit obtained secondary to the administration of local anesthetics. This carries significant diagnostic value as to the etiological location, or anatomical origin, of the pain. Duration of benefit is expected to coincide with the duration of the local anesthetic used.  Effectiveness during initial 4-6 hours after procedure(Mccarthy-Term Relief): 100 %   Long-term benefit: Defined as any relief past the pharmacologic duration of the local anesthetics.  Effectiveness past the initial 6 hours after procedure(Long-Term Relief):  (right knee 0%, left knee 70%)   Current benefits: Defined as benefit that persist at this time.   Analgesia: 0 % for right knee, >50% for left knee with improvement in range of motion for the left knee   Laboratory Chemistry Profile   Renal Lab Results  Component Value Date   BUN 18 10/07/2014   CREATININE 0.86 10/07/2014   GFRAA >60 10/07/2014   GFRNONAA >60 10/07/2014     Hepatic Lab Results  Component Value Date   AST 24 10/07/2014   ALT 20 10/07/2014   ALBUMIN 4.4 10/07/2014   ALKPHOS 78 10/07/2014     Electrolytes Lab Results  Component Value Date   NA 135 10/07/2014   K 3.7 10/07/2014   CL 99 (L) 10/07/2014   CALCIUM 9.0 10/07/2014     Bone No results found for: VD25OH, VD125OH2TOT, IF0277AJ2, IN8676HM0, 25OHVITD1, 25OHVITD2, 25OHVITD3, TESTOFREE, TESTOSTERONE   Inflammation (CRP: Acute Phase) (ESR: Chronic Phase) No results found for: CRP, ESRSEDRATE, LATICACIDVEN     Note: Above Lab results reviewed.  Imaging  MM 3D SCREEN BREAST BILATERAL CLINICAL DATA:  Screening.  EXAM: DIGITAL SCREENING BILATERAL  MAMMOGRAM WITH TOMO AND CAD  COMPARISON:  Previous exam(s).  ACR Breast Density Category c: The breast tissue is heterogeneously dense, which may obscure small masses.  FINDINGS: There are no findings suspicious for malignancy. Images were processed with CAD.  IMPRESSION: No mammographic evidence of malignancy. A result letter of this screening mammogram will be mailed directly to the patient.  RECOMMENDATION: Screening mammogram in one year. (Code:SM-B-01Y)  BI-RADS CATEGORY  1: Negative.  Electronically Signed   By: Valentino Saxon MD   On: 05/06/2020 16:02  Assessment  The primary encounter diagnosis was Chronic pain of left knee. Diagnoses of Primary osteoarthritis of left knee, Meniscus degeneration, left, Arthritis of right knee, Chronic pain of right knee, and Chronic pain syndrome were also pertinent to this visit.  Plan of Care  Ms. Anna Mccarthy has a current medication list which includes the following long-term medication(s): duloxetine, escitalopram, gabapentin, hydrochlorothiazide, and omeprazole.  1. Chronic pain of left knee -Status post left knee genicular RFA #2 on 05/04/2020 with greater than 50% pain relief and improvement in range of motion.  Can repeat in the future as needed.  PRN  order for repeat left knee genicular RFA placed as below  2. Primary osteoarthritis of left knee -Status post left knee genicular RFA #2 on 05/04/2020 with greater than 50% pain relief and improvement in range of motion.  Can repeat in the future as needed  3. Meniscus degeneration, left -Status post left meniscus surgery with Dr. Rudene Christians.  Patient requesting new referral to orthopedist for right knee pain  4. Arthritis of right knee -Refractory to steroid therapy, and viscosupplementation with Hyalgan (3 injections) - AMB referral to orthopedics - MR KNEE RIGHT  WO CONTRAST; Future  5. Chronic pain of right knee --Refractory to steroid therapy, and viscosupplementation with  Hyalgan (3 injections) --Failed PT - AMB referral to orthopedics - MR KNEE RIGHT WO CONTRAST; Future  Orders Placed This Encounter  Procedures  . Radiofrequency,Genicular    For knee pain.    Standing Status:   Standing    Number of Occurrences:   1    Standing Expiration Date:   06/18/2021    Scheduling Instructions:     Side(s): LEFT     Level(s): Superior-Lateral, Superior-Medial, and Inferior-Medial Genicular Nerve(s)     Sedation: Patient's choice.     TIMEFRAME: PRN procedure. (Anna Mccarthy will call when needed.)    Order Specific Question:   Where will this procedure be performed?    Answer:   [facility]  . MR KNEE RIGHT WO CONTRAST    Standing Status:   Future    Standing Expiration Date:   12/17/2020    Order Specific Question:   What is the patient's sedation requirement?    Answer:   No Sedation    Order Specific Question:   Does the patient have a pacemaker or implanted devices?    Answer:   No    Order Specific Question:   Preferred imaging location?    Answer:   [facility] (table limit - [phone])    Order Specific Question:   Radiology Contrast Protocol - do NOT remove file path    Answer:   \\[email or web address].PDF  . AMB referral to orthopedics    Referral Priority:   Routine    Referral Type:   Consultation    Referred to Provider:   Leandrew Koyanagi, MD    Number of Visits Requested:   1    Follow-up plan:   Return for PRN Left GN RFA.     Status post left genicular RFA 07/30/2018 which provided significant pain relief for approximately 1 year now, repeat on 11/11/2019: Therapeutic.  Right knee Hyalgan injection #1 on 02/03/2020.  #2 02/24/2020.  Status post right genicular nerve block on 04/15/2020.  S/p b/l genicular nerve RFA 05/04/20-50 to 70% pain relief for left knee and improvement in range of motion, no relief for right knee, referral to orthopedics.    Recent Visits Date Type Provider Dept  05/04/20 Procedure visit Gillis Santa, MD [facility]  04/29/20 Telemedicine Gillis Santa, MD [facility]  04/15/20 Procedure visit Gillis Santa, MD [facility]  04/13/20 Telemedicine Gillis Santa, MD [facility]  03/30/20 Procedure visit Gillis Santa, MD [facility]  03/23/20 Telemedicine Gillis Santa, MD [facility]  Showing recent visits within past 90 days and meeting all other requirements Today's Visits Date Type Provider Dept  06/18/20 Telemedicine Gillis Santa, MD [facility]  Showing today's visits and meeting all other requirements Future Appointments No visits were found meeting these conditions. Showing future appointments within next 90 days and meeting all other requirements  I discussed the assessment and treatment plan with the patient. The patient was provided an opportunity to ask questions and all were answered. The patient agreed with the plan and demonstrated an understanding of the instructions.  Patient advised to call back or seek an in-person evaluation if the symptoms or condition worsens.  Duration of encounter: 30 minutes.  Note by: Gillis Santa, MD Date: 06/18/2020; Time: 1:49 PM

## 2020-06-18 ENCOUNTER — Other Ambulatory Visit: Payer: Self-pay | Admitting: Student in an Organized Health Care Education/Training Program

## 2020-06-18 ENCOUNTER — Encounter: Payer: Self-pay | Admitting: Student in an Organized Health Care Education/Training Program

## 2020-06-18 ENCOUNTER — Other Ambulatory Visit: Payer: Self-pay

## 2020-06-18 ENCOUNTER — Ambulatory Visit
Payer: Medicare Other | Attending: Student in an Organized Health Care Education/Training Program | Admitting: Student in an Organized Health Care Education/Training Program

## 2020-06-18 DIAGNOSIS — M25562 Pain in left knee: Secondary | ICD-10-CM | POA: Diagnosis not present

## 2020-06-18 DIAGNOSIS — M23307 Other meniscus derangements, unspecified meniscus, left knee: Secondary | ICD-10-CM | POA: Diagnosis not present

## 2020-06-18 DIAGNOSIS — G894 Chronic pain syndrome: Secondary | ICD-10-CM

## 2020-06-18 DIAGNOSIS — M25561 Pain in right knee: Secondary | ICD-10-CM

## 2020-06-18 DIAGNOSIS — M1712 Unilateral primary osteoarthritis, left knee: Secondary | ICD-10-CM | POA: Diagnosis not present

## 2020-06-18 DIAGNOSIS — G8929 Other chronic pain: Secondary | ICD-10-CM

## 2020-06-18 DIAGNOSIS — M1711 Unilateral primary osteoarthritis, right knee: Secondary | ICD-10-CM | POA: Diagnosis not present

## 2020-06-24 ENCOUNTER — Ambulatory Visit (INDEPENDENT_AMBULATORY_CARE_PROVIDER_SITE_OTHER): Payer: Medicare Other | Admitting: Orthopaedic Surgery

## 2020-06-24 ENCOUNTER — Encounter: Payer: Self-pay | Admitting: Orthopaedic Surgery

## 2020-06-24 VITALS — Ht 62.0 in | Wt 170.0 lb

## 2020-06-24 DIAGNOSIS — M1711 Unilateral primary osteoarthritis, right knee: Secondary | ICD-10-CM | POA: Diagnosis not present

## 2020-06-24 MED ORDER — METHYLPREDNISOLONE ACETATE 40 MG/ML IJ SUSP
40.0000 mg | INTRAMUSCULAR | Status: AC | PRN
  Administered 2020-06-24: 40 mg via INTRA_ARTICULAR

## 2020-06-24 MED ORDER — LIDOCAINE HCL 1 % IJ SOLN
2.0000 mL | INTRAMUSCULAR | Status: AC | PRN
  Administered 2020-06-24: 2 mL

## 2020-06-24 MED ORDER — BUPIVACAINE HCL 0.5 % IJ SOLN
2.0000 mL | INTRAMUSCULAR | Status: AC | PRN
  Administered 2020-06-24: 2 mL via INTRA_ARTICULAR

## 2020-06-24 NOTE — Progress Notes (Signed)
Office Visit Note   Patient: Anna Mccarthy           Date of Birth: 1956-01-06           MRN: [id number] Visit Date: 06/24/2020              Requested by: Gillis Santa, MD [place],  [place] [address] PCP: Maryland Pink, MD   Assessment & Plan: Visit Diagnoses:  1. Primary osteoarthritis of right knee     Plan: Impression is chronic right knee pain.  Certainly sounds like she has DJD but given failure of conservative treatments thus far I would like to get an MRI to confirm this diagnosis.  In the meantime we will try cortisone injection to see if she can at least get some relief from this.  Follow-up after the MRI.  Follow-Up Instructions: Return if symptoms worsen or fail to improve.   Orders:  Orders Placed This Encounter  Procedures  . Large Joint Inj  . MR Knee Right w/o contrast   No orders of the defined types were placed in this encounter.     Procedures: Large Joint Inj: R knee on 06/24/2020 9:42 AM Indications: pain Details: 22 G needle  Arthrogram: No  Medications: 40 mg methylPREDNISolone acetate 40 MG/ML; 2 mL lidocaine 1 %; 2 mL bupivacaine 0.5 % Consent was given by the patient. Patient was prepped and draped in the usual sterile fashion.       Clinical Data: No additional findings.   Subjective: Chief Complaint  Patient presents with  . Right Knee - Pain    Anna Mccarthy is a very pleasant 64 year old female referral from Dr. Zollie Scale her pain management doctor from [facility] for evaluation of chronic right knee pain.  She has been dealing with this for at least a year.  She has undergone Visco injection as well as ablation therapy all with temporary and partial relief.  She the ablation therapy helped with nighttime pain but she is unable to stand or walk for any prolonged periods of time.  She does have fibromyalgia.  She has left knee DJD that was confirmed on prior MRI a few years ago.  She denies any injuries to the right knee.   Denies any mechanical symptoms.  Denies any significant swelling.   Review of Systems  Constitutional: Negative.   HENT: Negative.   Eyes: Negative.   Respiratory: Negative.   Cardiovascular: Negative.   Endocrine: Negative.   Musculoskeletal: Negative.   Neurological: Negative.   Hematological: Negative.   Psychiatric/Behavioral: Negative.   All other systems reviewed and are negative.    Objective: Vital Signs: Ht 5\' 2"  (1.575 m)   Wt 170 lb (77.1 kg)   BMI 31.09 kg/m   Physical Exam Vitals and nursing note reviewed.  Constitutional:      Appearance: She is well-developed and well-nourished.  HENT:     Head: Normocephalic and atraumatic.  Eyes:     Extraocular Movements: EOM normal.  Pulmonary:     Effort: Pulmonary effort is normal.  Abdominal:     Palpations: Abdomen is soft.  Musculoskeletal:     Cervical back: Neck supple.  Skin:    General: Skin is warm.     Capillary Refill: Capillary refill takes less than 2 seconds.  Neurological:     Mental Status: She is alert and oriented to person, place, and time.  Psychiatric:        Mood and Affect: Mood and affect normal.  Behavior: Behavior normal.        Thought Content: Thought content normal.        Judgment: Judgment normal.     Ortho Exam Right knee shows a trace effusion.  She cannot fully extend the knee secondary to pain.  Her flexion is limited to about 90 degrees due to a tearing sensation anteriorly.  Collaterals and cruciates are stable.  No joint line tenderness.  Specialty Comments:  No specialty comments available.  Imaging: No results found.   PMFS History: Patient Active Problem List   Diagnosis Date Noted  . Arthritis of right knee 01/30/2020  . Chronic pain of left knee 09/11/2018  . Meniscus degeneration, left 09/11/2018  . Fibromyalgia 09/11/2018  . Chronic pain syndrome 09/11/2018  . Venous ulcer ([facility]) 02/02/2017  . Varicose veins of bilateral lower extremities with  pain 05/16/2016  . Chronic venous insufficiency 05/16/2016  . Pain in limb 05/16/2016  . Swelling of limb 05/16/2016  . DJD (degenerative joint disease) 05/16/2016   Past Medical History:  Diagnosis Date  . Allergy   . Anxiety   . Arthritis   . Bronchitis   . Cancer (HCC)    skin  . GERD (gastroesophageal reflux disease)   . Hypertension     Family History  Problem Relation Age of Onset  . Breast cancer Maternal Aunt   . Ovarian cancer Sister   . Breast cancer Cousin     Past Surgical History:  Procedure Laterality Date  . ABDOMINAL HYSTERECTOMY  1996  . BREAST BIOPSY Left 2011   stereo done by dr. Lemar Livings -benign  . COLONOSCOPY    . COLONOSCOPY WITH PROPOFOL N/A 05/30/2016   Procedure: COLONOSCOPY WITH PROPOFOL;  Surgeon: Christena Deem, MD;  [facility];  Service: Endoscopy;  Laterality: N/A;  . HERNIA REPAIR  2012  . KNEE ARTHROSCOPY WITH MEDIAL MENISECTOMY Left 02/18/2016   Procedure: KNEE ARTHROSCOPY WITH PARTIAL MEDIAL MENISECTOMY;  Surgeon: Kennedy Bucker, MD;  Location: [facility];  Service: Orthopedics;  Laterality: Left;  . TUBAL LIGATION  1984   Social History   Occupational History  . Not on file  Tobacco Use  . Smoking status: Never Smoker  . Smokeless tobacco: Never Used  Substance and Sexual Activity  . Alcohol use: Yes    Alcohol/week: 2.0 standard drinks    Types: 2 Glasses of wine per week  . Drug use: No  . Sexual activity: Not on file

## 2020-06-25 ENCOUNTER — Ambulatory Visit: Payer: Medicare Other

## 2020-07-03 ENCOUNTER — Other Ambulatory Visit: Payer: Self-pay

## 2020-07-03 ENCOUNTER — Ambulatory Visit
Admission: RE | Admit: 2020-07-03 | Discharge: 2020-07-03 | Disposition: A | Discharge status: Home / Self Care | Payer: Medicare Other | Source: Ambulatory Visit | Attending: Orthopaedic Surgery | Admitting: Orthopaedic Surgery

## 2020-07-03 DIAGNOSIS — M1711 Unilateral primary osteoarthritis, right knee: Secondary | ICD-10-CM | POA: Diagnosis present

## 2020-07-11 ENCOUNTER — Other Ambulatory Visit: Payer: Self-pay | Admitting: Student in an Organized Health Care Education/Training Program

## 2020-07-16 ENCOUNTER — Telehealth: Payer: Medicare Other | Admitting: Student in an Organized Health Care Education/Training Program

## 2020-07-16 ENCOUNTER — Ambulatory Visit: Payer: Medicare Other | Admitting: Orthopaedic Surgery

## 2020-07-21 ENCOUNTER — Ambulatory Visit (INDEPENDENT_AMBULATORY_CARE_PROVIDER_SITE_OTHER): Payer: Medicare Other | Admitting: Orthopaedic Surgery

## 2020-07-21 ENCOUNTER — Other Ambulatory Visit: Payer: Self-pay

## 2020-07-21 ENCOUNTER — Encounter: Payer: Self-pay | Admitting: Orthopaedic Surgery

## 2020-07-21 DIAGNOSIS — M1711 Unilateral primary osteoarthritis, right knee: Secondary | ICD-10-CM

## 2020-07-21 DIAGNOSIS — M1712 Unilateral primary osteoarthritis, left knee: Secondary | ICD-10-CM | POA: Diagnosis not present

## 2020-07-21 DIAGNOSIS — M797 Fibromyalgia: Secondary | ICD-10-CM | POA: Diagnosis not present

## 2020-07-21 DIAGNOSIS — G894 Chronic pain syndrome: Secondary | ICD-10-CM | POA: Diagnosis not present

## 2020-07-21 NOTE — Addendum Note (Signed)
Addended by: Precious Bard on: 07/21/2020 01:50 PM   Modules accepted: Orders

## 2020-07-21 NOTE — Progress Notes (Signed)
Office Visit Note   Patient: Anna Mccarthy           Date of Birth: [birth year]           MRN: [id number] Visit Date: 07/21/2020              Requested by: Maryland Pink, MD [address] [facility] [place],  [place] [address] PCP: Maryland Pink, MD   Assessment & Plan: Visit Diagnoses:  1. Primary osteoarthritis of right knee   2. Primary osteoarthritis of left knee   3. Fibromyalgia   4. Chronic pain syndrome     Plan: MRI of the right knee shows complex tear of the posterior horn medial meniscus.  She also has advanced arthrosis and chondromalacia worse than the medial compartment.  These findings were discussed with the patient and her husband in detail.  Given the fact that she has received no significant relief from nonsurgical treatments such as nerve ablation, pain medications, NSAIDs, cortisone injections, physical therapy and bracing she has elected to proceed with a right total knee replacement.  Based on her pain description this is more consistent with her DJD and not in the medial meniscal tear.  My opinion a total knee replacement would be the most reliable means of providing her with pain relief she is looking for.  She has had a prior partial medial meniscectomy on the left knee which really did not give her any relief because she had underlying DJD as well.  We had a lengthy discussion on the details of the total knee replacement including risk benefits rehab recovery.  All questions answered.  Denies history of nickel allergy or DVT or aspirin allergy.  Prealbumin lab obtained today.  Total face to face encounter time was greater than 25 minutes and over half of this time was spent in counseling and/or coordination of care.  Follow-Up Instructions: Return if symptoms worsen or fail to improve.   Orders:  No orders of the defined types were placed in this encounter.  No orders of the defined types were placed in this encounter.     Procedures: No  procedures performed   Clinical Data: No additional findings.   Subjective: Chief Complaint  Patient presents with  . Right Knee - Pain    Anna Mccarthy returns today for follow-up of right knee MRI.  So far she has had chronic severe pain in both of her knees and is functionally limiting.  She has tried nerve ablations and chronic pain management clinic which is helped with the right knee.  She has had some partial relief with the left knee.  She is had cortisone injections in the past as well without significant relief.  Physical therapy and strengthening exercises have not helped either.   Review of Systems  Constitutional: Negative.   HENT: Negative.   Eyes: Negative.   Respiratory: Negative.   Cardiovascular: Negative.   Endocrine: Negative.   Musculoskeletal: Negative.   Neurological: Negative.   Hematological: Negative.   Psychiatric/Behavioral: Negative.   All other systems reviewed and are negative.    Objective: Vital Signs: There were no vitals taken for this visit.  Physical Exam Vitals and nursing note reviewed.  Constitutional:      Appearance: She is well-developed and well-nourished.  Pulmonary:     Effort: Pulmonary effort is normal.  Skin:    General: Skin is warm.     Capillary Refill: Capillary refill takes less than 2 seconds.  Neurological:  Mental Status: She is alert and oriented to person, place, and time.  Psychiatric:        Mood and Affect: Mood and affect normal.        Behavior: Behavior normal.        Thought Content: Thought content normal.        Judgment: Judgment normal.     Ortho Exam Right knee shows pain with range of motion.  Has slight limitation range of motion.  Mild crepitus.  Collaterals and cruciates are stable.  No joint effusion. Specialty Comments:  No specialty comments available.  Imaging: No results found.   PMFS History: Patient Active Problem List   Diagnosis Date Noted  . Arthritis of right knee  01/30/2020  . Chronic pain of left knee 09/11/2018  . Meniscus degeneration, left 09/11/2018  . Fibromyalgia 09/11/2018  . Chronic pain syndrome 09/11/2018  . Venous ulcer ([place]) 02/02/2017  . Varicose veins of bilateral lower extremities with pain 05/16/2016  . Chronic venous insufficiency 05/16/2016  . Pain in limb 05/16/2016  . Swelling of limb 05/16/2016  . DJD (degenerative joint disease) 05/16/2016   Past Medical History:  Diagnosis Date  . Allergy   . Anxiety   . Arthritis   . Bronchitis   . Cancer ([place])    skin  . GERD (gastroesophageal reflux disease)   . Hypertension     Family History  Problem Relation Age of Onset  . Breast cancer Maternal Aunt   . Ovarian cancer Sister   . Breast cancer Cousin     Past Surgical History:  Procedure Laterality Date  . ABDOMINAL HYSTERECTOMY  1996  . BREAST BIOPSY Left 2011   stereo done by dr. Bary Castilla -benign  . COLONOSCOPY    . COLONOSCOPY WITH PROPOFOL N/A 05/30/2016   Procedure: COLONOSCOPY WITH PROPOFOL;  Surgeon: Lollie Sails, MD;  Location: [facility] END[facility];  Service: Endoscopy;  Laterality: N/A;  . HERNIA REPAIR  2012  . KNEE ARTHROSCOPY WITH MEDIAL MENISECTOMY Left 02/18/2016   Procedure: KNEE ARTHROSCOPY WITH PARTIAL MEDIAL MENISECTOMY;  Surgeon: Hessie Knows, MD;  Location: [facility];  Service: Orthopedics;  Laterality: Left;  . TUBAL LIGATION  1984   Social History   Occupational History  . Not on file  Tobacco Use  . Smoking status: Never Smoker  . Smokeless tobacco: Never Used  Substance and Sexual Activity  . Alcohol use: Yes    Alcohol/week: 2.0 standard drinks    Types: 2 Glasses of wine per week  . Drug use: No  . Sexual activity: Not on file

## 2020-07-22 LAB — PREALBUMIN: Prealbumin: 28 mg/dL (ref 17–34)

## 2020-07-22 LAB — EXTRA LAV TOP TUBE

## 2020-07-30 ENCOUNTER — Encounter: Payer: Self-pay | Admitting: Student in an Organized Health Care Education/Training Program

## 2020-08-05 NOTE — Progress Notes (Signed)
Surgical Instructions    Your procedure is scheduled on Monday February 14th.  Report to [facility] Main Entrance "A" at 12:30 PM, then check in with the Admitting office.  Call this number if you have problems the morning of surgery:  [phone]   If you have any questions prior to your surgery date call [phone]: Open Monday-Friday 8am-4pm    Remember:  Do not eat after midnight the night before your surgery  You may drink clear liquids until 11:30 am the morning of your surgery.   Clear liquids allowed are: Water, Non-Citrus Juices (without pulp), Carbonated Beverages, Clear Tea, Black Coffee Only, and Gatorade   Enhanced Recovery after Surgery for Orthopedics Enhanced Recovery after Surgery is a protocol used to improve the stress on your body and your recovery after surgery.  Patient Instructions  . The night before surgery:  o No food after midnight. ONLY clear liquids after midnight   . The day of surgery (if you do NOT have diabetes):  o Drink ONE (1) Pre-Surgery Clear Ensure by _11:30____ am the morning of surgery   o This drink was given to you during your hospital  pre-op appointment visit. o Nothing else to drink after completing the  Pre-Surgery Clear Ensure.         If you have questions, please contact your surgeon's office.    Take these medicines the morning of surgery with A SIP OF WATER   DULoxetine (CYMBALTA) 20 MG capsule  gabapentin (NEURONTIN) 100 MG capsule  omeprazole (PRILOSEC) 40 MG capsule  IF NEEDED  ALPRAZolam (XANAX) 0.25 MG tablet  As of today, STOP taking any Aspirin (unless otherwise instructed by your surgeon) Aleve, Naproxen, Ibuprofen, Motrin, Advil, Goody's, BC's, all herbal medications, fish oil, and all vitamins.                     Do not wear jewelry, make up, or nail polish            Do not wear lotions, powders, perfumes, or deodorant.            Do not shave 48 hours prior to surgery.              Do not bring  valuables to the hospital.            [facility] is not responsible for any belongings or valuables.  Do NOT Smoke (Tobacco/Vaping) or drink Alcohol 24 hours prior to your procedure If you use a CPAP at night, you may bring all equipment for your overnight stay.   Contacts, glasses, dentures or bridgework may not be worn into surgery, please bring cases for these belongings   For patients admitted to the hospital, discharge time will be determined by your treatment team.   Patients discharged the day of surgery will not be allowed to drive home, and someone needs to stay with them for 24 hours.    Special instructions:   [place]- Preparing For Surgery  Before surgery, you can play an important role. Because skin is not sterile, your skin needs to be as free of germs as possible. You can reduce the number of germs on your skin by washing with CHG (chlorahexidine gluconate) Soap before surgery.  CHG is an antiseptic cleaner which kills germs and bonds with the skin to continue killing germs even after washing.    Oral Hygiene is also important to reduce your risk of infection.  Remember - BRUSH YOUR  TEETH THE MORNING OF SURGERY WITH YOUR REGULAR TOOTHPASTE  Please do not use if you have an allergy to CHG or antibacterial soaps. If your skin becomes reddened/irritated stop using the CHG.  Do not shave (including legs and underarms) for at least 48 hours prior to first CHG shower. It is OK to shave your face.  Please follow these instructions carefully.   1. Shower the NIGHT BEFORE SURGERY and the MORNING OF SURGERY  2. If you chose to wash your hair, wash your hair first as usual with your normal shampoo.  3. After you shampoo, rinse your hair and body thoroughly to remove the shampoo.  4. Wash Face and genitals (private parts) with your normal soap.   5.  Shower the NIGHT BEFORE SURGERY and the MORNING OF SURGERY with CHG Soap.   6. Use CHG Soap as you would any other liquid  soap. You can apply CHG directly to the skin and wash gently with a scrungie or a clean washcloth.   7. Apply the CHG Soap to your body ONLY FROM THE NECK DOWN.  Do not use on open wounds or open sores. Avoid contact with your eyes, ears, mouth and genitals (private parts). Wash Face and genitals (private parts)  with your normal soap.   8. Wash thoroughly, paying special attention to the area where your surgery will be performed.  9. Thoroughly rinse your body with warm water from the neck down.  10. DO NOT shower/wash with your normal soap after using and rinsing off the CHG Soap.  11. Pat yourself dry with a CLEAN TOWEL.  12. Wear CLEAN PAJAMAS to bed the night before surgery  13. Place CLEAN SHEETS on your bed the night before your surgery  14. DO NOT SLEEP WITH PETS.   Day of Surgery: Wear Clean/Comfortable clothing the morning of surgery Do not apply any deodorants/lotions.   Remember to brush your teeth WITH YOUR REGULAR TOOTHPASTE.   Please read over the following fact sheets that you were given.

## 2020-08-06 ENCOUNTER — Other Ambulatory Visit: Payer: Self-pay

## 2020-08-06 ENCOUNTER — Other Ambulatory Visit (HOSPITAL_COMMUNITY)
Admission: RE | Admit: 2020-08-06 | Discharge: 2020-08-06 | Disposition: A | Discharge status: Home / Self Care | Payer: Medicare Other | Source: Ambulatory Visit | Attending: Orthopaedic Surgery | Admitting: Orthopaedic Surgery

## 2020-08-06 ENCOUNTER — Encounter (HOSPITAL_COMMUNITY): Payer: Self-pay

## 2020-08-06 ENCOUNTER — Encounter (HOSPITAL_COMMUNITY)
Admission: RE | Admit: 2020-08-06 | Discharge: 2020-08-06 | Disposition: A | Discharge status: Home / Self Care | Payer: Medicare Other | Source: Ambulatory Visit | Attending: Physician Assistant | Admitting: Physician Assistant

## 2020-08-06 ENCOUNTER — Encounter (HOSPITAL_COMMUNITY)
Admission: RE | Admit: 2020-08-06 | Discharge: 2020-08-06 | Disposition: A | Discharge status: Home / Self Care | Payer: Medicare Other | Source: Ambulatory Visit | Attending: Orthopaedic Surgery | Admitting: Orthopaedic Surgery

## 2020-08-06 DIAGNOSIS — Z01818 Encounter for other preprocedural examination: Secondary | ICD-10-CM | POA: Diagnosis present

## 2020-08-06 DIAGNOSIS — Z20822 Contact with and (suspected) exposure to covid-19: Secondary | ICD-10-CM | POA: Insufficient documentation

## 2020-08-06 DIAGNOSIS — M1711 Unilateral primary osteoarthritis, right knee: Secondary | ICD-10-CM

## 2020-08-06 HISTORY — DX: Unspecified osteoarthritis, unspecified site: M19.90

## 2020-08-06 LAB — CBC WITH DIFFERENTIAL/PLATELET
Abs Immature Granulocytes: 0.01 10*3/uL (ref 0.00–0.07)
Basophils Absolute: 0.1 10*3/uL (ref 0.0–0.1)
Basophils Relative: 2 %
Eosinophils Absolute: 0.2 10*3/uL (ref 0.0–0.5)
Eosinophils Relative: 4 %
HCT: 41.1 % (ref 36.0–46.0)
Hemoglobin: 13.3 g/dL (ref 12.0–15.0)
Immature Granulocytes: 0 %
Lymphocytes Relative: 39 %
Lymphs Abs: 2 10*3/uL (ref 0.7–4.0)
MCH: 28.4 pg (ref 26.0–34.0)
MCHC: 32.4 g/dL (ref 30.0–36.0)
MCV: 87.8 fL (ref 80.0–100.0)
Monocytes Absolute: 0.5 10*3/uL (ref 0.1–1.0)
Monocytes Relative: 9 %
Neutro Abs: 2.4 10*3/uL (ref 1.7–7.7)
Neutrophils Relative %: 46 %
Platelets: 404 10*3/uL — ABNORMAL HIGH (ref 150–400)
RBC: 4.68 MIL/uL (ref 3.87–5.11)
RDW: 12.6 % (ref 11.5–15.5)
WBC: 5.2 10*3/uL (ref 4.0–10.5)
nRBC: 0 % (ref 0.0–0.2)

## 2020-08-06 LAB — COMPREHENSIVE METABOLIC PANEL
ALT: 19 U/L (ref 0–44)
AST: 21 U/L (ref 15–41)
Albumin: 4.1 g/dL (ref 3.5–5.0)
Alkaline Phosphatase: 79 U/L (ref 38–126)
Anion gap: 11 (ref 5–15)
BUN: 14 mg/dL (ref 8–23)
CO2: 28 mmol/L (ref 22–32)
Calcium: 9.3 mg/dL (ref 8.9–10.3)
Chloride: 94 mmol/L — ABNORMAL LOW (ref 98–111)
Creatinine, Ser: 0.93 mg/dL (ref 0.44–1.00)
GFR, Estimated: >60 mL/min (ref >60)
Glucose, Bld: 102 mg/dL — ABNORMAL HIGH (ref 70–99)
Potassium: 4.6 mmol/L (ref 3.5–5.1)
Sodium: 133 mmol/L — ABNORMAL LOW (ref 135–145)
Total Bilirubin: 0.4 mg/dL (ref 0.3–1.2)
Total Protein: 7.7 g/dL (ref 6.5–8.1)

## 2020-08-06 LAB — URINALYSIS, ROUTINE W REFLEX MICROSCOPIC
Bilirubin Urine: NEGATIVE
Glucose, UA: NEGATIVE mg/dL
Hgb urine dipstick: NEGATIVE
Ketones, ur: NEGATIVE mg/dL
Leukocytes,Ua: NEGATIVE
Nitrite: NEGATIVE
Protein, ur: NEGATIVE mg/dL
Specific Gravity, Urine: 1.015 (ref 1.005–1.030)
pH: 7 (ref 5.0–8.0)

## 2020-08-06 LAB — PROTIME-INR
INR: 1 (ref 0.8–1.2)
Prothrombin Time: 12.8 seconds (ref 11.4–15.2)

## 2020-08-06 LAB — TYPE AND SCREEN
ABO/RH(D): A NEG
Antibody Screen: NEGATIVE

## 2020-08-06 LAB — SURGICAL PCR SCREEN
MRSA, PCR: NEGATIVE
Staphylococcus aureus: NEGATIVE

## 2020-08-06 LAB — SARS CORONAVIRUS 2 (TAT 6-24 HRS): SARS Coronavirus 2: NEGATIVE

## 2020-08-06 LAB — APTT: aPTT: 28 seconds (ref 24–36)

## 2020-08-06 NOTE — Progress Notes (Signed)
PCP - Dr. Irish Lack  Cardiologist - Denies  Chest x-ray - 08/06/20  EKG - 08/06/20  Stress Test - Denies  ECHO - 10/28/14 (CE)  Cardiac Cath - Denies  AICD-na PM-na LOOP-na  Sleep Study - Denies CPAP - Denies  LABS- CBC w/D, CMP, PT, PTT, T/S, PCR, UA, COVID  ASA- Denies  ERAS- Yes- 1 drink given  HA1C- Denies  Anesthesia- No  Pt denies having chest pain, sob, or fever at this time. All instructions explained to the pt, with a verbal understanding of the material. Pt agrees to go over the instructions while at home for a better understanding. Pt also instructed to self quarantine after being tested for COVID-19. The opportunity to ask questions was provided.   Coronavirus Screening  Have you experienced the following symptoms:  Cough yes/no: No Fever (>100.25F)  yes/no: No Runny nose yes/no: No Sore throat yes/no: No Difficulty breathing/shortness of breath  yes/no: No  Have you or a family member traveled in the last 14 days and where? yes/no: No   If the patient indicates "YES" to the above questions, their PAT will be rescheduled to limit the exposure to others and, the surgeon will be notified. THE PATIENT WILL NEED TO BE ASYMPTOMATIC FOR 14 DAYS.   If the patient is not experiencing any of these symptoms, the PAT nurse will instruct them to NOT bring anyone with them to their appointment since they may have these symptoms or traveled as well.   Please remind your patients and families that hospital visitation restrictions are in effect and the importance of the restrictions.

## 2020-08-07 ENCOUNTER — Other Ambulatory Visit: Payer: Self-pay | Admitting: Physician Assistant

## 2020-08-07 MED ORDER — BUPIVACAINE LIPOSOME 1.3 % IJ SUSP
20.0000 mL | Freq: Once | INTRAMUSCULAR | Status: DC
  Filled 2020-08-07: qty 20

## 2020-08-07 MED ORDER — ASPIRIN EC 81 MG PO TBEC: 81.0000 mg | DELAYED_RELEASE_TABLET | Freq: Two times a day (BID) | ORAL | 0 refills | Status: DC

## 2020-08-07 MED ORDER — ONDANSETRON HCL 4 MG PO TABS: 4.0000 mg | ORAL_TABLET | Freq: Three times a day (TID) | ORAL | 0 refills | Status: DC | PRN

## 2020-08-07 MED ORDER — HYDROCODONE-ACETAMINOPHEN 7.5-325 MG PO TABS
1.0000 | ORAL_TABLET | Freq: Four times a day (QID) | ORAL | 0 refills | Status: DC | PRN
Start: 2020-08-07 — End: 2020-08-18

## 2020-08-07 MED ORDER — METHOCARBAMOL 500 MG PO TABS: 500.0000 mg | ORAL_TABLET | Freq: Two times a day (BID) | ORAL | 0 refills | Status: DC | PRN

## 2020-08-07 MED ORDER — TRANEXAMIC ACID 1000 MG/10ML IV SOLN
2000.0000 mg | INTRAVENOUS | Status: DC
  Filled 2020-08-07: qty 20

## 2020-08-07 MED ORDER — DOCUSATE SODIUM 100 MG PO CAPS: 100.0000 mg | ORAL_CAPSULE | Freq: Every day | ORAL | 2 refills | Status: DC | PRN

## 2020-08-10 ENCOUNTER — Observation Stay (HOSPITAL_COMMUNITY)
Admission: RE | Admit: 2020-08-10 | Discharge: 2020-08-11 | Disposition: A | Discharge status: Home / Self Care | Payer: Medicare Other | Attending: Orthopaedic Surgery | Admitting: Orthopaedic Surgery

## 2020-08-10 ENCOUNTER — Encounter (HOSPITAL_COMMUNITY)
Admission: RE | Disposition: A | Discharge status: Home / Self Care | Payer: Self-pay | Source: Home / Self Care | Attending: Orthopaedic Surgery

## 2020-08-10 ENCOUNTER — Encounter (HOSPITAL_COMMUNITY): Payer: Self-pay | Admitting: Orthopaedic Surgery

## 2020-08-10 ENCOUNTER — Other Ambulatory Visit: Payer: Self-pay

## 2020-08-10 ENCOUNTER — Ambulatory Visit (HOSPITAL_COMMUNITY): Payer: Medicare Other | Admitting: Anesthesiology

## 2020-08-10 ENCOUNTER — Ambulatory Visit (HOSPITAL_COMMUNITY): Payer: Medicare Other

## 2020-08-10 DIAGNOSIS — Z96651 Presence of right artificial knee joint: Secondary | ICD-10-CM

## 2020-08-10 DIAGNOSIS — Z79899 Other long term (current) drug therapy: Secondary | ICD-10-CM | POA: Diagnosis not present

## 2020-08-10 DIAGNOSIS — M1711 Unilateral primary osteoarthritis, right knee: Secondary | ICD-10-CM | POA: Diagnosis present

## 2020-08-10 DIAGNOSIS — M797 Fibromyalgia: Secondary | ICD-10-CM | POA: Diagnosis not present

## 2020-08-10 DIAGNOSIS — K219 Gastro-esophageal reflux disease without esophagitis: Secondary | ICD-10-CM | POA: Diagnosis not present

## 2020-08-10 DIAGNOSIS — I10 Essential (primary) hypertension: Secondary | ICD-10-CM | POA: Insufficient documentation

## 2020-08-10 DIAGNOSIS — F419 Anxiety disorder, unspecified: Secondary | ICD-10-CM | POA: Insufficient documentation

## 2020-08-10 DIAGNOSIS — R52 Pain, unspecified: Secondary | ICD-10-CM

## 2020-08-10 HISTORY — PX: TOTAL KNEE ARTHROPLASTY: SHX125

## 2020-08-10 LAB — ABO/RH: ABO/RH(D): A NEG

## 2020-08-10 SURGERY — ARTHROPLASTY, KNEE, TOTAL
Anesthesia: Regional | Site: Knee | Laterality: Right

## 2020-08-10 MED ORDER — DEXAMETHASONE SODIUM PHOSPHATE 10 MG/ML IJ SOLN
INTRAMUSCULAR | Status: DC | PRN
  Administered 2020-08-10: 5 mg via INTRAVENOUS

## 2020-08-10 MED ORDER — ESCITALOPRAM OXALATE 20 MG PO TABS
20.0000 mg | ORAL_TABLET | Freq: Every day | ORAL | Status: DC
  Administered 2020-08-10: 20 mg via ORAL
  Filled 2020-08-10 (×2): qty 1

## 2020-08-10 MED ORDER — KETOROLAC TROMETHAMINE 15 MG/ML IJ SOLN
15.0000 mg | Freq: Four times a day (QID) | INTRAMUSCULAR | Status: AC
  Administered 2020-08-10 – 2020-08-11 (×4): 15 mg via INTRAVENOUS
  Filled 2020-08-10 (×3): qty 1

## 2020-08-10 MED ORDER — SODIUM CHLORIDE (PF) 0.9 % IJ SOLN
INTRAMUSCULAR | Status: AC
  Filled 2020-08-10: qty 10

## 2020-08-10 MED ORDER — METOCLOPRAMIDE HCL 5 MG/ML IJ SOLN
5.0000 mg | Freq: Three times a day (TID) | INTRAMUSCULAR | Status: DC | PRN
Start: 2020-08-10 — End: 2020-08-11

## 2020-08-10 MED ORDER — FENTANYL CITRATE (PF) 100 MCG/2ML IJ SOLN
INTRAMUSCULAR | Status: AC
  Filled 2020-08-10: qty 2

## 2020-08-10 MED ORDER — ONDANSETRON HCL 4 MG/2ML IJ SOLN
INTRAMUSCULAR | Status: DC | PRN
  Administered 2020-08-10: 4 mg via INTRAVENOUS

## 2020-08-10 MED ORDER — METOCLOPRAMIDE HCL 5 MG PO TABS: 5.0000 mg | ORAL_TABLET | Freq: Three times a day (TID) | ORAL | Status: DC | PRN

## 2020-08-10 MED ORDER — LIDOCAINE HCL (CARDIAC) PF 100 MG/5ML IV SOSY
PREFILLED_SYRINGE | INTRAVENOUS | Status: DC | PRN
  Administered 2020-08-10: 100 mg via INTRATRACHEAL

## 2020-08-10 MED ORDER — ASPIRIN 81 MG PO CHEW
81.0000 mg | CHEWABLE_TABLET | Freq: Two times a day (BID) | ORAL | Status: DC
  Administered 2020-08-10 – 2020-08-11 (×2): 81 mg via ORAL
  Filled 2020-08-10 (×2): qty 1

## 2020-08-10 MED ORDER — ONDANSETRON HCL 4 MG/2ML IJ SOLN: 4.0000 mg | Freq: Four times a day (QID) | INTRAMUSCULAR | Status: DC | PRN

## 2020-08-10 MED ORDER — FENTANYL CITRATE (PF) 100 MCG/2ML IJ SOLN: 50.0000 ug | Freq: Once | INTRAMUSCULAR | Status: AC

## 2020-08-10 MED ORDER — SUFENTANIL CITRATE 50 MCG/ML IV SOLN
INTRAVENOUS | Status: DC | PRN
  Administered 2020-08-10: 10 ug via INTRAVENOUS

## 2020-08-10 MED ORDER — PROPOFOL 10 MG/ML IV BOLUS
INTRAVENOUS | Status: AC
  Filled 2020-08-10: qty 20

## 2020-08-10 MED ORDER — SODIUM CHLORIDE 0.9% FLUSH
INTRAVENOUS | Status: DC | PRN
  Administered 2020-08-10: 10 mL via INTRAVENOUS

## 2020-08-10 MED ORDER — MORPHINE SULFATE (PF) 2 MG/ML IV SOLN: 0.5000 mg | INTRAVENOUS | Status: DC | PRN

## 2020-08-10 MED ORDER — DEXAMETHASONE SODIUM PHOSPHATE 10 MG/ML IJ SOLN
INTRAMUSCULAR | Status: AC
  Filled 2020-08-10: qty 1

## 2020-08-10 MED ORDER — SUFENTANIL CITRATE 50 MCG/ML IV SOLN
INTRAVENOUS | Status: AC
  Filled 2020-08-10: qty 1

## 2020-08-10 MED ORDER — MENTHOL 3 MG MT LOZG: 1.0000 | LOZENGE | OROMUCOSAL | Status: DC | PRN

## 2020-08-10 MED ORDER — BUPIVACAINE HCL (PF) 0.25 % IJ SOLN
INTRAMUSCULAR | Status: DC | PRN
  Administered 2020-08-10: 20 mL

## 2020-08-10 MED ORDER — CEFAZOLIN SODIUM-DEXTROSE 2-4 GM/100ML-% IV SOLN
2.0000 g | Freq: Four times a day (QID) | INTRAVENOUS | Status: AC
  Administered 2020-08-10 (×2): 2 g via INTRAVENOUS
  Filled 2020-08-10 (×2): qty 100

## 2020-08-10 MED ORDER — POLYETHYLENE GLYCOL 3350 17 G PO PACK
17.0000 g | PACK | Freq: Every day | ORAL | Status: DC
  Administered 2020-08-10 – 2020-08-11 (×2): 17 g via ORAL
  Filled 2020-08-10 (×2): qty 1

## 2020-08-10 MED ORDER — BUPIVACAINE HCL (PF) 0.25 % IJ SOLN
INTRAMUSCULAR | Status: AC
  Filled 2020-08-10: qty 10

## 2020-08-10 MED ORDER — ALPRAZOLAM 0.25 MG PO TABS: 0.2500 mg | ORAL_TABLET | Freq: Two times a day (BID) | ORAL | Status: DC | PRN

## 2020-08-10 MED ORDER — MIDAZOLAM HCL 2 MG/2ML IJ SOLN: 2.0000 mg | Freq: Once | INTRAMUSCULAR | Status: AC

## 2020-08-10 MED ORDER — ONDANSETRON HCL 4 MG PO TABS: 4.0000 mg | ORAL_TABLET | Freq: Four times a day (QID) | ORAL | Status: DC | PRN

## 2020-08-10 MED ORDER — MIDAZOLAM HCL 2 MG/2ML IJ SOLN
INTRAMUSCULAR | Status: AC
  Administered 2020-08-10: 2 mg via INTRAVENOUS
  Filled 2020-08-10: qty 2

## 2020-08-10 MED ORDER — HYDROCODONE-ACETAMINOPHEN 5-325 MG PO TABS
1.0000 | ORAL_TABLET | ORAL | Status: DC | PRN
  Administered 2020-08-11: 2 via ORAL
  Administered 2020-08-11: 1 via ORAL
  Filled 2020-08-10: qty 2
  Filled 2020-08-10: qty 1

## 2020-08-10 MED ORDER — IRRISEPT - 450ML BOTTLE WITH 0.05% CHG IN STERILE WATER, USP 99.95% OPTIME
TOPICAL | Status: DC | PRN
  Administered 2020-08-10: 450 mL

## 2020-08-10 MED ORDER — KETOROLAC TROMETHAMINE 30 MG/ML IJ SOLN
INTRAMUSCULAR | Status: AC
  Filled 2020-08-10: qty 1

## 2020-08-10 MED ORDER — METHOCARBAMOL 1000 MG/10ML IJ SOLN
500.0000 mg | Freq: Four times a day (QID) | INTRAVENOUS | Status: DC | PRN
  Filled 2020-08-10: qty 5

## 2020-08-10 MED ORDER — BUPIVACAINE HCL (PF) 0.25 % IJ SOLN
INTRAMUSCULAR | Status: DC | PRN
  Administered 2020-08-10: 10 mL

## 2020-08-10 MED ORDER — HYDROCODONE-ACETAMINOPHEN 7.5-325 MG PO TABS
1.0000 | ORAL_TABLET | ORAL | Status: DC | PRN
  Administered 2020-08-10: 1 via ORAL
  Filled 2020-08-10: qty 1

## 2020-08-10 MED ORDER — DOCUSATE SODIUM 100 MG PO CAPS
100.0000 mg | ORAL_CAPSULE | Freq: Two times a day (BID) | ORAL | Status: DC
  Administered 2020-08-10 – 2020-08-11 (×2): 100 mg via ORAL
  Filled 2020-08-10 (×2): qty 1

## 2020-08-10 MED ORDER — ACETAMINOPHEN 325 MG PO TABS
325.0000 mg | ORAL_TABLET | Freq: Four times a day (QID) | ORAL | Status: DC | PRN
Start: 2020-08-11 — End: 2020-08-11

## 2020-08-10 MED ORDER — METHOCARBAMOL 500 MG PO TABS
500.0000 mg | ORAL_TABLET | Freq: Four times a day (QID) | ORAL | Status: DC | PRN
  Administered 2020-08-10 – 2020-08-11 (×2): 500 mg via ORAL
  Filled 2020-08-10 (×2): qty 1

## 2020-08-10 MED ORDER — ORAL CARE MOUTH RINSE: 15.0000 mL | Freq: Once | OROMUCOSAL | Status: AC

## 2020-08-10 MED ORDER — HYDROXYCHLOROQUINE SULFATE 200 MG PO TABS
200.0000 mg | ORAL_TABLET | Freq: Two times a day (BID) | ORAL | Status: DC
  Administered 2020-08-10 – 2020-08-11 (×2): 200 mg via ORAL
  Filled 2020-08-10 (×3): qty 1

## 2020-08-10 MED ORDER — FENTANYL CITRATE (PF) 100 MCG/2ML IJ SOLN
25.0000 ug | INTRAMUSCULAR | Status: DC | PRN
Start: 2020-08-10 — End: 2020-08-10
  Administered 2020-08-10 (×2): 25 ug via INTRAVENOUS
  Administered 2020-08-10 (×2): 50 ug via INTRAVENOUS

## 2020-08-10 MED ORDER — LIDOCAINE 2% (20 MG/ML) 5 ML SYRINGE
INTRAMUSCULAR | Status: AC
  Filled 2020-08-10: qty 5

## 2020-08-10 MED ORDER — HYDROCHLOROTHIAZIDE 25 MG PO TABS
25.0000 mg | ORAL_TABLET | Freq: Every day | ORAL | Status: DC
Start: 2020-08-11 — End: 2020-08-11
  Administered 2020-08-11: 25 mg via ORAL
  Filled 2020-08-10: qty 1

## 2020-08-10 MED ORDER — DIPHENHYDRAMINE HCL 12.5 MG/5ML PO ELIX
25.0000 mg | ORAL_SOLUTION | ORAL | Status: DC | PRN
  Filled 2020-08-10: qty 10

## 2020-08-10 MED ORDER — SODIUM CHLORIDE 0.9 % IR SOLN
Status: DC | PRN
  Administered 2020-08-10: 3000 mL

## 2020-08-10 MED ORDER — 0.9 % SODIUM CHLORIDE (POUR BTL) OPTIME
TOPICAL | Status: DC | PRN
  Administered 2020-08-10: 1000 mL

## 2020-08-10 MED ORDER — CEFAZOLIN SODIUM-DEXTROSE 2-4 GM/100ML-% IV SOLN
2.0000 g | INTRAVENOUS | Status: AC
  Administered 2020-08-10: 2 g via INTRAVENOUS
  Filled 2020-08-10: qty 100

## 2020-08-10 MED ORDER — MAGNESIUM CITRATE PO SOLN: 1.0000 | Freq: Once | ORAL | Status: DC | PRN

## 2020-08-10 MED ORDER — ACETAMINOPHEN 500 MG PO TABS
1000.0000 mg | ORAL_TABLET | Freq: Once | ORAL | Status: AC
  Administered 2020-08-10: 1000 mg via ORAL
  Filled 2020-08-10: qty 2

## 2020-08-10 MED ORDER — FENTANYL CITRATE (PF) 100 MCG/2ML IJ SOLN
INTRAMUSCULAR | Status: AC
  Administered 2020-08-10: 50 ug via INTRAVENOUS
  Filled 2020-08-10: qty 2

## 2020-08-10 MED ORDER — POVIDONE-IODINE 10 % EX SWAB
2.0000 "application " | Freq: Once | CUTANEOUS | Status: AC
  Administered 2020-08-10: 2 via TOPICAL

## 2020-08-10 MED ORDER — DEXAMETHASONE SODIUM PHOSPHATE 10 MG/ML IJ SOLN
INTRAMUSCULAR | Status: DC | PRN
  Administered 2020-08-10: 5 mg

## 2020-08-10 MED ORDER — PANTOPRAZOLE SODIUM 40 MG PO TBEC
40.0000 mg | DELAYED_RELEASE_TABLET | Freq: Every day | ORAL | Status: DC
  Administered 2020-08-10 – 2020-08-11 (×2): 40 mg via ORAL
  Filled 2020-08-10 (×2): qty 1

## 2020-08-10 MED ORDER — TRANEXAMIC ACID-NACL 1000-0.7 MG/100ML-% IV SOLN
1000.0000 mg | Freq: Once | INTRAVENOUS | Status: AC
  Administered 2020-08-10: 1000 mg via INTRAVENOUS
  Filled 2020-08-10: qty 100

## 2020-08-10 MED ORDER — KETOROLAC TROMETHAMINE 15 MG/ML IJ SOLN
INTRAMUSCULAR | Status: AC
  Filled 2020-08-10: qty 1

## 2020-08-10 MED ORDER — LABETALOL HCL 5 MG/ML IV SOLN
10.0000 mg | Freq: Once | INTRAVENOUS | Status: AC
  Administered 2020-08-10: 10 mg via INTRAVENOUS

## 2020-08-10 MED ORDER — DULOXETINE HCL 20 MG PO CPEP
20.0000 mg | ORAL_CAPSULE | Freq: Two times a day (BID) | ORAL | Status: DC
  Administered 2020-08-10 – 2020-08-11 (×2): 20 mg via ORAL
  Filled 2020-08-10 (×3): qty 1

## 2020-08-10 MED ORDER — BUPIVACAINE-EPINEPHRINE (PF) 0.25% -1:200000 IJ SOLN
INTRAMUSCULAR | Status: AC
  Filled 2020-08-10: qty 10

## 2020-08-10 MED ORDER — LACTATED RINGERS IV SOLN: INTRAVENOUS | Status: DC | PRN

## 2020-08-10 MED ORDER — LACTATED RINGERS IV SOLN: INTRAVENOUS | Status: DC

## 2020-08-10 MED ORDER — ALUM & MAG HYDROXIDE-SIMETH 200-200-20 MG/5ML PO SUSP
30.0000 mL | ORAL | Status: DC | PRN
  Administered 2020-08-11: 30 mL via ORAL
  Filled 2020-08-10: qty 30

## 2020-08-10 MED ORDER — VANCOMYCIN HCL 1000 MG IV SOLR
INTRAVENOUS | Status: DC | PRN
  Administered 2020-08-10: 1000 mg

## 2020-08-10 MED ORDER — SODIUM CHLORIDE 0.9 % IV SOLN: INTRAVENOUS | Status: DC

## 2020-08-10 MED ORDER — ACETAMINOPHEN 500 MG PO TABS
1000.0000 mg | ORAL_TABLET | Freq: Four times a day (QID) | ORAL | Status: DC
  Administered 2020-08-10 – 2020-08-11 (×3): 1000 mg via ORAL
  Filled 2020-08-10 (×3): qty 2

## 2020-08-10 MED ORDER — PHENOL 1.4 % MT LIQD: 1.0000 | OROMUCOSAL | Status: DC | PRN

## 2020-08-10 MED ORDER — ONDANSETRON HCL 4 MG/2ML IJ SOLN
INTRAMUSCULAR | Status: AC
  Filled 2020-08-10: qty 2

## 2020-08-10 MED ORDER — VANCOMYCIN HCL 1000 MG IV SOLR
INTRAVENOUS | Status: AC
  Filled 2020-08-10: qty 1000

## 2020-08-10 MED ORDER — SORBITOL 70 % SOLN
30.0000 mL | Freq: Every day | Status: DC | PRN
  Filled 2020-08-10: qty 30

## 2020-08-10 MED ORDER — TRANEXAMIC ACID-NACL 1000-0.7 MG/100ML-% IV SOLN
1000.0000 mg | INTRAVENOUS | Status: AC
  Administered 2020-08-10: 1000 mg via INTRAVENOUS
  Filled 2020-08-10: qty 100

## 2020-08-10 MED ORDER — BUPIVACAINE-MELOXICAM ER 400-12 MG/14ML IJ SOLN
INTRAMUSCULAR | Status: AC
  Filled 2020-08-10: qty 1

## 2020-08-10 MED ORDER — TRANEXAMIC ACID 1000 MG/10ML IV SOLN
INTRAVENOUS | Status: DC | PRN
  Administered 2020-08-10: 2000 mg via TOPICAL

## 2020-08-10 MED ORDER — PROPOFOL 10 MG/ML IV BOLUS
INTRAVENOUS | Status: DC | PRN
  Administered 2020-08-10: 150 mg via INTRAVENOUS
  Administered 2020-08-10: 50 mg via INTRAVENOUS
  Administered 2020-08-10: 20 mg via INTRAVENOUS

## 2020-08-10 MED ORDER — CHLORHEXIDINE GLUCONATE 0.12 % MT SOLN
15.0000 mL | Freq: Once | OROMUCOSAL | Status: AC
  Administered 2020-08-10: 15 mL via OROMUCOSAL
  Filled 2020-08-10: qty 15

## 2020-08-10 MED ORDER — LABETALOL HCL 5 MG/ML IV SOLN
INTRAVENOUS | Status: AC
  Filled 2020-08-10: qty 4

## 2020-08-10 MED ORDER — METHOCARBAMOL 500 MG PO TABS
ORAL_TABLET | ORAL | Status: AC
  Filled 2020-08-10: qty 1

## 2020-08-10 MED ORDER — BUPIVACAINE-MELOXICAM ER 400-12 MG/14ML IJ SOLN
INTRAMUSCULAR | Status: DC | PRN
  Administered 2020-08-10: 400 mg

## 2020-08-10 SURGICAL SUPPLY — 75 items
ALCOHOL 70% 16 OZ (MISCELLANEOUS) ×2 IMPLANT
BAG DECANTER FOR FLEXI CONT (MISCELLANEOUS) ×2 IMPLANT
BANDAGE ESMARK 6X9 LF (GAUZE/BANDAGES/DRESSINGS) IMPLANT
BLADE SAG 18X100X1.27 (BLADE) ×2 IMPLANT
BNDG ESMARK 6X9 LF (GAUZE/BANDAGES/DRESSINGS)
BOWL SMART MIX CTS (DISPOSABLE) ×2 IMPLANT
CEMENT BONE REFOBACIN R1X40 US (Cement) ×4 IMPLANT
CLSR STERI-STRIP ANTIMIC 1/2X4 (GAUZE/BANDAGES/DRESSINGS) ×4 IMPLANT
COOLER ICEMAN CLASSIC (MISCELLANEOUS) ×2 IMPLANT
COVER SURGICAL LIGHT HANDLE (MISCELLANEOUS) ×2 IMPLANT
COVER WAND RF STERILE (DRAPES) IMPLANT
CUFF TOURN SGL QUICK 34 (TOURNIQUET CUFF) ×1
CUFF TOURN SGL QUICK 42 (TOURNIQUET CUFF) IMPLANT
CUFF TRNQT CYL 34X4.125X (TOURNIQUET CUFF) ×1 IMPLANT
DERMABOND ADVANCED (GAUZE/BANDAGES/DRESSINGS) ×1
DERMABOND ADVANCED .7 DNX12 (GAUZE/BANDAGES/DRESSINGS) ×1 IMPLANT
DRAPE EXTREMITY T 121X128X90 (DISPOSABLE) ×2 IMPLANT
DRAPE HALF SHEET 40X57 (DRAPES) ×2 IMPLANT
DRAPE INCISE IOBAN 66X45 STRL (DRAPES) IMPLANT
DRAPE ORTHO SPLIT 77X108 STRL (DRAPES) ×2
DRAPE POUCH INSTRU U-SHP 10X18 (DRAPES) ×2 IMPLANT
DRAPE SURG ORHT 6 SPLT 77X108 (DRAPES) ×2 IMPLANT
DRAPE U-SHAPE 47X51 STRL (DRAPES) ×4 IMPLANT
DRSG AQUACEL AG ADV 3.5X10 (GAUZE/BANDAGES/DRESSINGS) ×2 IMPLANT
DURAPREP 26ML APPLICATOR (WOUND CARE) ×6 IMPLANT
ELECT CAUTERY BLADE 6.4 (BLADE) ×2 IMPLANT
ELECT REM PT RETURN 9FT ADLT (ELECTROSURGICAL) ×2
ELECTRODE REM PT RTRN 9FT ADLT (ELECTROSURGICAL) ×1 IMPLANT
FEMUR CMT CR STD SZ 6 RT KNEE (Joint) ×2 IMPLANT
FEMUR CMTD CR STD SZ 6 RT KNEE (Joint) ×1 IMPLANT
GLOVE ECLIPSE 7.0 STRL STRAW (GLOVE) ×6 IMPLANT
GLOVE SKINSENSE NS SZ7.5 (GLOVE) ×3
GLOVE SKINSENSE STRL SZ7.5 (GLOVE) ×3 IMPLANT
GLOVE SURG SYN 7.5  E (GLOVE) ×4
GLOVE SURG SYN 7.5 E (GLOVE) ×4 IMPLANT
GLOVE SURG UNDER POLY LF SZ7 (GLOVE) ×2 IMPLANT
GOWN STRL REIN XL XLG (GOWN DISPOSABLE) ×2 IMPLANT
GOWN STRL REUS W/ TWL LRG LVL3 (GOWN DISPOSABLE) ×1 IMPLANT
GOWN STRL REUS W/TWL LRG LVL3 (GOWN DISPOSABLE) ×1
HANDPIECE INTERPULSE COAX TIP (DISPOSABLE) ×1
HOOD PEEL AWAY FLYTE STAYCOOL (MISCELLANEOUS) ×4 IMPLANT
INSERT TIB ASF PERS CD6-7 RT (Insert) ×2 IMPLANT
JET LAVAGE IRRISEPT WOUND (IRRIGATION / IRRIGATOR) ×2
KIT BASIN OR (CUSTOM PROCEDURE TRAY) ×2 IMPLANT
KIT TURNOVER KIT B (KITS) ×2 IMPLANT
LAVAGE JET IRRISEPT WOUND (IRRIGATION / IRRIGATOR) ×1 IMPLANT
MANIFOLD NEPTUNE II (INSTRUMENTS) ×2 IMPLANT
MARKER SKIN DUAL TIP RULER LAB (MISCELLANEOUS) ×2 IMPLANT
NEEDLE SPNL 18GX3.5 QUINCKE PK (NEEDLE) ×4 IMPLANT
NS IRRIG 1000ML POUR BTL (IV SOLUTION) ×2 IMPLANT
PACK TOTAL JOINT (CUSTOM PROCEDURE TRAY) ×2 IMPLANT
PAD ARMBOARD 7.5X6 YLW CONV (MISCELLANEOUS) ×4 IMPLANT
PAD COLD SHLDR WRAP-ON (PAD) ×2 IMPLANT
SAW OSC TIP CART 19.5X105X1.3 (SAW) ×2 IMPLANT
SET HNDPC FAN SPRY TIP SCT (DISPOSABLE) ×1 IMPLANT
STAPLER VISISTAT 35W (STAPLE) IMPLANT
STEM POLY PAT PLY 32M KNEE (Knees) ×2 IMPLANT
STEM TIBIA 5 DEG SZ D R KNEE (Knees) ×1 IMPLANT
STRIP CLOSURE SKIN 1/2X4 (GAUZE/BANDAGES/DRESSINGS) ×2 IMPLANT
SUCTION FRAZIER HANDLE 10FR (MISCELLANEOUS) ×1
SUCTION TUBE FRAZIER 10FR DISP (MISCELLANEOUS) ×1 IMPLANT
SUT ETHILON 2 0 FS 18 (SUTURE) IMPLANT
SUT MNCRL AB 4-0 PS2 18 (SUTURE) IMPLANT
SUT VIC AB 0 CT1 27 (SUTURE) ×2
SUT VIC AB 0 CT1 27XBRD ANBCTR (SUTURE) ×2 IMPLANT
SUT VIC AB 1 CTX 27 (SUTURE) ×6 IMPLANT
SUT VIC AB 2-0 CT1 27 (SUTURE) ×4
SUT VIC AB 2-0 CT1 TAPERPNT 27 (SUTURE) ×4 IMPLANT
SYR 50ML LL SCALE MARK (SYRINGE) ×4 IMPLANT
TIBIA STEM 5 DEG SZ D R KNEE (Knees) ×2 IMPLANT
TOWEL GREEN STERILE (TOWEL DISPOSABLE) ×2 IMPLANT
TOWEL GREEN STERILE FF (TOWEL DISPOSABLE) ×2 IMPLANT
TRAY CATH 16FR W/PLASTIC CATH (SET/KITS/TRAYS/PACK) IMPLANT
UNDERPAD 30X36 HEAVY ABSORB (UNDERPADS AND DIAPERS) ×2 IMPLANT
WRAP KNEE MAXI GEL POST OP (GAUZE/BANDAGES/DRESSINGS) ×2 IMPLANT

## 2020-08-10 NOTE — Anesthesia Procedure Notes (Signed)
Anesthesia Regional Block: Adductor canal block   Pre-Anesthetic Checklist: ,, timeout performed, Correct Patient, Correct Site, Correct Laterality, Correct Procedure, Correct Position, site marked, Risks and benefits discussed,  Surgical consent,  Pre-op evaluation,  At surgeon's request and post-op pain management  Laterality: Right  Prep: Maximum Sterile Barrier Precautions used, chloraprep       Needles:  Injection technique: Single-shot  Needle Type: Echogenic Stimulator Needle     Needle Length: 9cm  Needle Gauge: 22     Additional Needles:   Procedures:,,,, ultrasound used (permanent image in chart),,,,  Narrative:  Start time: 08/10/2020 11:49 AM End time: 08/10/2020 11:59 AM Injection made incrementally with aspirations every 5 mL.  Performed by: Personally  Anesthesiologist: Freddrick March, MD  Additional Notes: Monitors applied. No increased pain on injection. No increased resistance to injection. Injection made in 5cc increments. Good needle visualization. Patient tolerated procedure well.

## 2020-08-10 NOTE — Discharge Instructions (Signed)

## 2020-08-10 NOTE — H&P (Signed)
PREOPERATIVE H&P  Chief Complaint: right knee degenerative joint disease  HPI: Anna Mccarthy is a 65 y.o. female who presents for surgical treatment of right knee degenerative joint disease.  She denies any changes in medical history.  Past Medical History:  Diagnosis Date  . Allergy   . Anxiety   . Arthritis   . Bronchitis   . Cancer ([place])    skin  . Fibromyalgia   . GERD (gastroesophageal reflux disease)   . Hypertension   . Osteoarthritis    Past Surgical History:  Procedure Laterality Date  . ABDOMINAL HYSTERECTOMY  1996  . BREAST BIOPSY Left 2011   stereo done by dr. Bary Castilla -benign  . COLONOSCOPY    . COLONOSCOPY WITH PROPOFOL N/A 05/30/2016   Procedure: COLONOSCOPY WITH PROPOFOL;  Surgeon: Lollie Sails, MD;  Location: [facility] ENDOSCOPY;  Service: Endoscopy;  Laterality: N/A;  . DILATION AND CURETTAGE OF UTERUS     x3  . HERNIA REPAIR  2012  . KNEE ARTHROSCOPY WITH MEDIAL MENISECTOMY Left 02/18/2016   Procedure: KNEE ARTHROSCOPY WITH PARTIAL MEDIAL MENISECTOMY;  Surgeon: Hessie Knows, MD;  Location: [facility] ORS;  Service: Orthopedics;  Laterality: Left;  . TUBAL LIGATION  1984   Social History   Socioeconomic History  . Marital status: Married    Spouse name: Not on file  . Number of children: Not on file  . Years of education: Not on file  . Highest education level: Not on file  Occupational History  . Not on file  Tobacco Use  . Smoking status: Never Smoker  . Smokeless tobacco: Never Used  Vaping Use  . Vaping Use: Never used  Substance and Sexual Activity  . Alcohol use: Yes    Alcohol/week: 2.0 standard drinks    Types: 2 Glasses of wine per week  . Drug use: No  . Sexual activity: Not on file  Other Topics Concern  . Not on file  Social History Narrative  . Not on file   Social Determinants of Health   Financial Resource Strain: Not on file  Food Insecurity: Not on file  Transportation Needs: Not on file  Physical Activity: Not on file   Stress: Not on file  Social Connections: Not on file   Family History  Problem Relation Age of Onset  . Breast cancer Maternal Aunt   . Ovarian cancer Sister   . Breast cancer Cousin    Allergies  Allergen Reactions  . Percocet [Oxycodone-Acetaminophen] Itching  . Sulfa Antibiotics     Gi problems    Prior to Admission medications   Medication Sig Start Date End Date Taking? Authorizing Provider  ALPRAZolam (XANAX) 0.25 MG tablet Take 0.25 mg by mouth 2 (two) times daily as needed for anxiety. 11/17/16  Yes [provider]  aspirin EC 81 MG tablet Take 1 tablet (81 mg total) by mouth 2 (two) times daily. To be taken after surgery 08/07/20   Aundra Dubin, PA-C  docusate sodium (COLACE) 100 MG capsule Take 1 capsule (100 mg total) by mouth daily as needed. 08/07/20 08/07/21  Aundra Dubin, PA-C  DULoxetine (CYMBALTA) 20 MG capsule Take 20 mg by mouth 2 (two) times daily. 03/22/17  Yes [provider]  escitalopram (LEXAPRO) 20 MG tablet Take 20 mg by mouth at bedtime.    Yes [provider]  fluocinonide (LIDEX) 0.05 % external solution Apply 1 application topically 2 (two) times daily as needed (acne). 09/06/18  Yes [provider]  gabapentin (NEURONTIN) 100 MG capsule Take 2 capsules (200 mg total) by mouth in the morning, at noon, and at bedtime. 04/20/20 07/19/20 Yes Gillis Santa, MD  hydrochlorothiazide (HYDRODIURIL) 25 MG tablet Take 25 mg by mouth daily.   Yes [provider]  HYDROcodone-acetaminophen (NORCO) 7.5-325 MG tablet Take 1-2 tablets by mouth every 6 (six) hours as needed for moderate pain. To be taken after surgery 08/07/20   Aundra Dubin, PA-C  hydroxychloroquine (PLAQUENIL) 200 MG tablet Take 200 mg by mouth 2 (two) times daily. 06/25/18  Yes [provider]  methocarbamol (ROBAXIN) 500 MG tablet Take 1 tablet (500 mg total) by mouth 2 (two) times daily as needed. To be taken after surgery 08/07/20   Aundra Dubin, PA-C  omeprazole (PRILOSEC) 40 MG capsule Take 40 mg by mouth daily. 08/10/15  Yes [provider]  ondansetron (ZOFRAN) 4 MG tablet Take 1 tablet (4 mg total) by mouth every 8 (eight) hours as needed for nausea or vomiting. 08/07/20   Aundra Dubin, PA-C  Vitamin D, Ergocalciferol, (DRISDOL) 1.25 MG (50000 UNIT) CAPS capsule Take 50,000 Units by mouth every Sunday. 05/11/20  Yes [provider]     Positive ROS: All other systems have been reviewed and were otherwise negative with the exception of those mentioned in the HPI and as above.  Physical Exam: General: Alert, no acute distress Cardiovascular: No pedal edema Respiratory: No cyanosis, no use of accessory musculature GI: abdomen soft Skin: No lesions in the area of chief complaint Neurologic: Sensation intact distally Psychiatric: Patient is competent for consent with normal mood and affect Lymphatic: no lymphedema  MUSCULOSKELETAL: exam stable  Assessment: right knee degenerative joint disease  Plan: Plan for Procedure(s): RIGHT TOTAL KNEE ARTHROPLASTY  The risks benefits and alternatives were discussed with the patient including but not limited to the risks of nonoperative treatment, versus surgical intervention including infection, bleeding, nerve injury,  blood clots, cardiopulmonary complications, morbidity, mortality, among others, and they were willing to proceed.   Preoperative templating of the joint replacement has been completed, documented, and submitted to the Operating Room personnel in order to optimize intra-operative equipment management.   Eduard Roux, MD 08/10/2020 9:00 AM

## 2020-08-10 NOTE — Transfer of Care (Signed)
Immediate Anesthesia Transfer of Care Note  Patient: Anna Mccarthy  Procedure(s) Performed: RIGHT TOTAL KNEE ARTHROPLASTY (Right Knee)  Patient Location: PACU  Anesthesia Type:General  Level of Consciousness: drowsy and patient cooperative  Airway & Oxygen Therapy: Patient Spontanous Breathing  Post-op Assessment: Report given to RN and Post -op Vital signs reviewed and stable  Post vital signs: Reviewed and stable  Last Vitals:  Vitals Value Taken Time  BP 178/105 08/10/20 1455  Temp    Pulse 87 08/10/20 1456  Resp 17 08/10/20 1456  SpO2 91 % 08/10/20 1456  Vitals shown include unvalidated device data.  Last Pain:  Vitals:   08/10/20 1047  TempSrc:   PainSc: 3       Patients Stated Pain Goal: 3 (48/27/07 8675)  Complications: No complications documented.

## 2020-08-10 NOTE — Anesthesia Preprocedure Evaluation (Addendum)
Anesthesia Evaluation  Patient identified by MRN, date of birth, ID band Patient awake    Reviewed: Allergy & Precautions, NPO status , Patient's Chart, lab work & pertinent test results  Airway Mallampati: II  TM Distance: >3 FB Neck ROM: Full    Dental no notable dental hx. (+) Teeth Intact, Dental Advisory Given   Pulmonary neg pulmonary ROS,    Pulmonary exam normal breath sounds clear to auscultation       Cardiovascular hypertension, Pt. on medications negative cardio ROS Normal cardiovascular exam Rhythm:Regular Rate:Normal     Neuro/Psych PSYCHIATRIC DISORDERS Anxiety negative neurological ROS     GI/Hepatic Neg liver ROS, GERD  ,  Endo/Other  negative endocrine ROS  Renal/GU negative Renal ROS  negative genitourinary   Musculoskeletal  (+) Arthritis , Rheumatoid disorders,  Fibromyalgia -, narcotic dependent  Abdominal   Peds  Hematology negative hematology ROS (+)   Anesthesia Other Findings   Reproductive/Obstetrics                          Anesthesia Physical Anesthesia Plan  ASA: II  Anesthesia Plan: Spinal and Regional   Post-op Pain Management:  Regional for Post-op pain   Induction:   PONV Risk Score and Plan: 2 and Treatment may vary due to age or medical condition, Propofol infusion, Ondansetron and Midazolam  Airway Management Planned: Natural Airway  Additional Equipment:   Intra-op Plan:   Post-operative Plan:   Informed Consent: I have reviewed the patients History and Physical, chart, labs and discussed the procedure including the risks, benefits and alternatives for the proposed anesthesia with the patient or authorized representative who has indicated his/her understanding and acceptance.     Dental advisory given  Plan Discussed with: CRNA  Anesthesia Plan Comments:         Anesthesia Quick Evaluation

## 2020-08-10 NOTE — Op Note (Signed)
Total Knee Arthroplasty Procedure Note  Preoperative diagnosis: Right knee osteoarthritis  Postoperative diagnosis:same  Operative procedure: Right total knee arthroplasty. CPT [phone]  Surgeon: N. Eduard Roux, MD  Assist: Madalyn Rob, PA-C; necessary for the timely completion of procedure and due to complexity of procedure.  Anesthesia: general, adductor canal block, zynrelief, 0.25% marcaine  Tourniquet time: see anesthesia record  Implants used: Zimmer persona Femur: CR 6 Tibia: D Patella: 32 mm Polyethylene: 11 mm, MC  Indication: Anna Mccarthy is a 64 y.o. year old female with a history of knee pain. Having failed conservative management, the patient elected to proceed with a total knee arthroplasty.  We have reviewed the risk and benefits of the surgery and they elected to proceed after voicing understanding.  Procedure:  After informed consent was obtained and understanding of the risk were voiced including but not limited to bleeding, infection, damage to surrounding structures including nerves and vessels, blood clots, leg length inequality and the failure to achieve desired results, the operative extremity was marked with verbal confirmation of the patient in the holding area.   The patient was then brought to the operating room and transported to the operating room table in the supine position.  A tourniquet was applied to the operative extremity around the upper thigh. The operative limb was then prepped and draped in the usual sterile fashion and preoperative antibiotics were administered.  A time out was performed prior to the start of surgery confirming the correct extremity, preoperative antibiotic administration, as well as team members, implants and instruments available for the case. Correct surgical site was also confirmed with preoperative radiographs. The limb was then elevated for exsanguination and the tourniquet was inflated. A midline incision was  made and a standard medial parapatellar approach was performed.  The infrapatellar fat pad was removed.  Suprapatellar synovium was removed to reveal the anterior distal femoral cortex.  A medial peel was performed to release the capsule of the medial tibial plateau.  The patella was then everted and was prepared and sized to a 32 mm.  A cover was placed on the patella for protection from retractors.  The knee was then brought into full flexion and we then turned our attention to the femur.  The cruciates were sacrificed.  Start site was drilled in the femur and the intramedullary distal femoral cutting guide was placed, set at 5 degrees valgus, taking 10 mm of distal resection. The distal cut was made. Osteophytes were then removed.  Next, the proximal tibial cutting guide was placed with appropriate slope, varus/valgus alignment and depth of resection. The proximal tibial cut was made. Gap blocks were then used to assess the extension gap and alignment, and appropriate soft tissue releases were performed. Attention was turned back to the femur, which was sized using the sizing guide to a size 6. Appropriate rotation of the femoral component was determined using epicondylar axis, Whiteside's line, and assessing the flexion gap under ligament tension. The appropriate size 4-in-1 cutting block was placed and checked with an angel wing and cuts were made. Posterior femoral osteophytes and uncapped bone were then removed with the curved osteotome.  Trial components were placed, and stability was checked in full extension, mid-flexion, and deep flexion. Proper tibial rotation was determined and marked.  The patella tracked well without a lateral release.  The femoral lugs were then drilled. Trial components were then removed and tibial preparation performed.  The tibia was sized for a size D component.  A posterior capsular injection comprising of 10 cc of 0.25% marcaine and 10 cc of normal saline was performed for  postoperative pain control. The bony surfaces were irrigated with a pulse lavage and then dried. Bone cement was vacuum mixed on the back table, and the final components sized above were cemented into place.  Antibiotic irrigation was placed in the knee joint and soft tissues while the cement cured.  After cement had finished curing, excess cement was removed. The stability of the construct was re-evaluated throughout a range of motion and found to be acceptable. The trial liner was removed, the knee was copiously irrigated, and the knee was re-evaluated for any excess bone debris.  The surgical bed was dried.  Zynrelief applied topically throughout the capsule.  The real polyethylene liner, 11 mm thick, was inserted and checked to ensure the locking mechanism had engaged appropriately. The tourniquet was deflated and hemostasis was achieved. The wound was irrigated with normal saline.  One gram of vancomycin powder was placed in the surgical bed.  Capsular closure was performed with a #1 vicryl, subcutaneous fat closed with a 0 vicryl suture, then subcutaneous tissue closed with interrupted 2.0 vicryl suture. The skin was then closed with a 4.0 monocryl and steri strips. A sterile dressing was applied.  The patient was awakened in the operating room and taken to recovery in stable condition. All sponge, needle, and instrument counts were correct at the end of the case.  Tawanna Cooler was necessary for opening, closing, retracting, limb positioning and overall facilitation and completion of the surgery.  Position: supine  Complications: none.  Time Out: performed   Drains/Packing: none  Estimated blood loss: minimal  Returned to Recovery Room: in good condition.   Antibiotics: yes   Mechanical VTE (DVT) Prophylaxis: sequential compression devices, TED thigh-high  Chemical VTE (DVT) Prophylaxis: aspirin  Fluid Replacement  Crystalloid: see anesthesia record Blood: none  FFP: none    Specimens Removed: 1 to pathology   Sponge and Instrument Count Correct? yes   PACU: portable radiograph - knee AP and Lateral   Plan/RTC: Return in 2 weeks for wound check.   Weight Bearing/Load Lower Extremity: full   Implant Name Type Inv. Item Serial No. Manufacturer Lot No. LRB No. Used Action  CEMENT BONE REFOBACIN [id number] [place] - [id number] Cement CEMENT BONE REFOBACIN [id number] [place]  ZIMMER RECON(ORTH,TRAU,BIO,SG) [id number] Right 2 Implanted  STEM POLY PAT PLY 35M KNEE - [id number] Knees STEM POLY PAT PLY 35M KNEE  ZIMMER RECON(ORTH,TRAU,BIO,SG) [id number] Right 1 Implanted  TIBIA STEM 5 DEG SZ D R KNEE - [id number] Knees TIBIA STEM 5 DEG SZ D R KNEE  ZIMMER RECON(ORTH,TRAU,BIO,SG) [id number] Right 1 Implanted  FEMUR CMT CR STD SZ 6 RT KNEE - [id number] Joint FEMUR CMT CR STD SZ 6 RT KNEE  ZIMMER RECON(ORTH,TRAU,BIO,SG) [id number] Right 1 Implanted  PERSONA  RIGHT FEMUR SIZES 6-7    Zimmer [id number] Right 1 Implanted    N. Eduard Roux, MD [facility] 2:18 PM

## 2020-08-11 ENCOUNTER — Encounter (HOSPITAL_COMMUNITY): Payer: Self-pay | Admitting: Orthopaedic Surgery

## 2020-08-11 DIAGNOSIS — M1711 Unilateral primary osteoarthritis, right knee: Secondary | ICD-10-CM | POA: Diagnosis not present

## 2020-08-11 LAB — BASIC METABOLIC PANEL
Anion gap: 10 (ref 5–15)
BUN: 10 mg/dL (ref 8–23)
CO2: 25 mmol/L (ref 22–32)
Calcium: 8.8 mg/dL — ABNORMAL LOW (ref 8.9–10.3)
Chloride: 96 mmol/L — ABNORMAL LOW (ref 98–111)
Creatinine, Ser: 0.85 mg/dL (ref 0.44–1.00)
GFR, Estimated: >60 mL/min (ref >60)
Glucose, Bld: 177 mg/dL — ABNORMAL HIGH (ref 70–99)
Potassium: 3.9 mmol/L (ref 3.5–5.1)
Sodium: 131 mmol/L — ABNORMAL LOW (ref 135–145)

## 2020-08-11 LAB — CBC
HCT: 33.6 % — ABNORMAL LOW (ref 36.0–46.0)
Hemoglobin: 11.1 g/dL — ABNORMAL LOW (ref 12.0–15.0)
MCH: 28.8 pg (ref 26.0–34.0)
MCHC: 33 g/dL (ref 30.0–36.0)
MCV: 87.3 fL (ref 80.0–100.0)
Platelets: 301 10*3/uL (ref 150–400)
RBC: 3.85 MIL/uL — ABNORMAL LOW (ref 3.87–5.11)
RDW: 12.7 % (ref 11.5–15.5)
WBC: 9.1 10*3/uL (ref 4.0–10.5)
nRBC: 0 % (ref 0.0–0.2)

## 2020-08-11 NOTE — TOC Progression Note (Signed)
Transition of Care [facility]) - Progression Note    Patient Details  Name: Anna Mccarthy MRN: [id number] Date of Birth: 14-Jan-1956  Transition of Care [facility]) CM/SW [place], RN Phone Number: [phone]  08/11/2020, 1:55 PM  Clinical Narrative:    Home health has been previously set up with [facility]. No other needs noted TOC will sign off.         Expected Discharge Plan and Services           Expected Discharge Date: 08/11/20                                     Social Determinants of Health (SDOH) Interventions    Readmission Risk Interventions No flowsheet data found.

## 2020-08-11 NOTE — Anesthesia Postprocedure Evaluation (Signed)
Anesthesia Post Note  Patient: Anna Mccarthy  Procedure(s) Performed: RIGHT TOTAL KNEE ARTHROPLASTY (Right Knee)     Patient location during evaluation: PACU Anesthesia Type: Regional and General Level of consciousness: awake and alert Pain management: pain level controlled Vital Signs Assessment: post-procedure vital signs reviewed and stable Respiratory status: spontaneous breathing, nonlabored ventilation, respiratory function stable and patient connected to nasal cannula oxygen Cardiovascular status: blood pressure returned to baseline and stable Postop Assessment: no apparent nausea or vomiting Anesthetic complications: no   No complications documented.  Last Vitals:  Vitals:   08/10/20 2332 08/11/20 0306  BP: 119/64 (!) 141/82  Pulse: 76 68  Resp: 18 18  Temp: (!) 36.4 C (!) 36.3 C  SpO2: 96% 97%    Last Pain:  Vitals:   08/11/20 0507  TempSrc:   PainSc: [place]

## 2020-08-11 NOTE — Progress Notes (Signed)
Subjective: 1 Day Post-Op Procedure(s) (LRB): RIGHT TOTAL KNEE ARTHROPLASTY (Right) Patient reports pain as mild.    Objective: Vital signs in last 24 hours: Temp:  [97 F (36.1 C)-98.3 F (36.8 C)] 97.8 F (36.6 C) (02/15 0737) Pulse Rate:  [65-85] 66 (02/15 0737) Resp:  [10-18] 16 (02/15 0737) BP: (119-178)/(64-105) 132/86 (02/15 0737) SpO2:  [91 %-100 %] 98 % (02/15 0737) Weight:  [78 kg] 78 kg (02/14 1011)  Intake/Output from previous day: 02/14 0701 - 02/15 0700 In: 1000 [I.V.:900; IV Piggyback:100] Out: 25 [Blood:25] Intake/Output this shift: No intake/output data recorded.  Recent Labs    08/11/20 0410  HGB 11.1*   Recent Labs    08/11/20 0410  WBC 9.1  RBC 3.85*  HCT 33.6*  PLT 301   Recent Labs    08/11/20 0410  NA 131*  K 3.9  CL 96*  CO2 25  BUN 10  CREATININE 0.85  GLUCOSE 177*  CALCIUM 8.8*   No results for input(s): LABPT, INR in the last 72 hours.  Neurologically intact Neurovascular intact Sensation intact distally Intact pulses distally Dorsiflexion/Plantar flexion intact Incision: dressing C/D/I No cellulitis present Compartment soft   Assessment/Plan: 1 Day Post-Op Procedure(s) (LRB): RIGHT TOTAL KNEE ARTHROPLASTY (Right) Advance diet Up with therapy D/C IV fluids Discharge home with home health after second PT session WBAT RLE ABLA- mild and stable  Anticipated LOS equal to or greater than 2 midnights due to - Age 67 and older with one or more of the following:  - Obesity  - Expected need for hospital services (PT, OT, Nursing) required for safe  discharge  - Anticipated need for postoperative skilled nursing care or inpatient rehab  - Active co-morbidities: None OR   - Unanticipated findings during/Post Surgery: Slow post-op progression: GI, pain control, mobility  - Patient is a high risk of re-admission due to: None    Aundra Dubin 08/11/2020, 7:59 AM

## 2020-08-11 NOTE — Plan of Care (Signed)
Pt and husband given D/C instructions with verbal understanding. Pt's incision is clean and dry with no sign of infection. Pt's IV was removed prior to D/C. Pt D/C'd home via wheelchair per MD order. Pt received RW per MD order. Pt is stable @ D/C and has no other needs at this time. Holli Humbles, RN

## 2020-08-11 NOTE — Evaluation (Addendum)
Physical Therapy Evaluation Patient Details Name: NYOMIE EHRLICH MRN: [id number] DOB: 11-25-1955 Today's Date: 08/11/2020   History of Present Illness  Pt is a 65 y/o female who presents s/p R TKA on 08/10/2020. PMH significant for OA, HTN, fibromyalgia, skin CA.  Clinical Impression  Pt is s/p TKA resulting in the deficits listed below (see PT Problem List). At the time of PT eval pt was able to perform transfers and ambulation with gross min guard assist to supervision for safety. She completed stair training and HEP was initiated. Pt will benefit from skilled PT to increase their independence and safety with mobility to allow discharge to the venue listed below.      Follow Up Recommendations Follow surgeon's recommendation for DC plan and follow-up therapies    Equipment Recommendations  Rolling walker with 5" wheels    Recommendations for Other Services       Precautions / Restrictions Precautions Precautions: Knee Precaution Comments: Pt was educated on NO pillow/roll/ice pack UNDER knee. Towel roll under heel only. Restrictions Weight Bearing Restrictions: Yes RLE Weight Bearing: Weight bearing as tolerated      Mobility  Bed Mobility Overal bed mobility: Needs Assistance Bed Mobility: Supine to Sit     Supine to sit: Supervision     General bed mobility comments: No assist to transition to EOB.    Transfers Overall transfer level: Needs assistance Equipment used: Rolling walker (2 wheeled) Transfers: Sit to/from Stand Sit to Stand: Supervision         General transfer comment: Light supervision for power-up to full standing position. Pt demonstrated proper hand placement on seated surface for safety and no knee buckling noted.  Ambulation/Gait Ambulation/Gait assistance: Min guard;Supervision Gait Distance (Feet): 250 Feet Assistive device: Rolling walker (2 wheeled) Gait Pattern/deviations: Step-through pattern;Decreased stride length;Trunk flexed Gait  velocity: Decreased Gait velocity interpretation: 1.31 - 2.62 ft/sec, indicative of limited community ambulator General Gait Details: VC's for improved posture and closer walker proximity. Pt was able to demonstrate a smooth gait pattern with RW and mild antalgia.  Stairs Stairs: Yes Stairs assistance: Min guard;Supervision Stair Management: One rail Right;Step to pattern;Forwards Number of Stairs: 3 General stair comments: VC's for sequencing and general safety.  Wheelchair Mobility    Modified Rankin (Stroke Patients Only)       Balance Overall balance assessment: Needs assistance Sitting-balance support: Feet supported;No upper extremity supported Sitting balance-Leahy Scale: Good     Standing balance support: Bilateral upper extremity supported Standing balance-Leahy Scale: Poor Standing balance comment: Reliant on UE support                             Pertinent Vitals/Pain Pain Assessment: Faces Faces Pain Scale: Hurts little more Pain Location: Knee Pain Descriptors / Indicators: Operative site guarding;Aching Pain Intervention(s): Limited activity within patient's tolerance;Monitored during session;Repositioned    Home Living Family/patient expects to be discharged to:: Private residence Living Arrangements: Spouse/significant other Available Help at Discharge: Family;Available 24 hours/day Type of Home: House Home Access: Stairs to enter Entrance Stairs-Rails: Right Entrance Stairs-Number of Steps: 3 Home Layout: One level Home Equipment: Shower seat - built in      Prior Function Level of Independence: Independent               Hand Dominance        Extremity/Trunk Assessment   Upper Extremity Assessment Upper Extremity Assessment: Overall WFL for tasks assessed    Lower  Extremity Assessment Lower Extremity Assessment: RLE deficits/detail RLE Deficits / Details: Decreased strength and AROM consistent with above mentioned  procedure.    Cervical / Trunk Assessment Cervical / Trunk Assessment: Normal  Communication   Communication: No difficulties  Cognition Arousal/Alertness: Awake/alert Behavior During Therapy: WFL for tasks assessed/performed Overall Cognitive Status: Within Functional Limits for tasks assessed                                        General Comments      Exercises Total Joint Exercises Ankle Circles/Pumps: 10 reps Quad Sets: 10 reps Heel Slides: 10 reps Hip ABduction/ADduction: 10 reps Straight Leg Raises: 10 reps Long Arc Quad: 10 reps Knee Flexion: 5 reps Goniometric ROM: Pt able to achieve >90 flexion in sitting   Assessment/Plan    PT Assessment Patient needs continued PT services  PT Problem List Decreased strength;Decreased range of motion;Decreased activity tolerance;Decreased balance;Decreased mobility;Decreased knowledge of use of DME;Decreased safety awareness;Decreased knowledge of precautions;Pain       PT Treatment Interventions DME instruction;Gait training;Stair training;Functional mobility training;Therapeutic activities;Therapeutic exercise;Neuromuscular re-education;Balance training;Patient/family education    PT Goals (Current goals can be found in the Care Plan section)  Acute Rehab PT Goals Patient Stated Goal: Home this morning PT Goal Formulation: With patient Time For Goal Achievement: 08/18/20 Potential to Achieve Goals: Good    Frequency 7X/week   Barriers to discharge        Co-evaluation               AM-PAC PT "6 Clicks" Mobility  Outcome Measure Help needed turning from your back to your side while in a flat bed without using bedrails?: None Help needed moving from lying on your back to sitting on the side of a flat bed without using bedrails?: A Little Help needed moving to and from a bed to a chair (including a wheelchair)?: A Little Help needed standing up from a chair using your arms (e.g., wheelchair or  bedside chair)?: A Little Help needed to walk in hospital room?: A Little Help needed climbing 3-5 steps with a railing? : A Little 6 Click Score: 19    End of Session Equipment Utilized During Treatment: Gait belt Activity Tolerance: Patient tolerated treatment well Patient left: in bed;with call bell/phone within reach Nurse Communication: Mobility status PT Visit Diagnosis: Unsteadiness on feet (R26.81);Pain Pain - Right/Left: Right Pain - part of body: Knee    Time: 9390-3009 PT Time Calculation (min) (ACUTE ONLY): 32 min   Charges:   PT Evaluation $PT Eval Moderate Complexity: 1 Mod PT Treatments $Gait Training: 8-22 mins        Rolinda Roan, PT, DPT Acute Rehabilitation Services Pager: [phone] Office: [phone]   Thelma Comp 08/11/2020, 12:12 PM

## 2020-08-11 NOTE — Discharge Summary (Signed)
Patient ID: Anna Mccarthy MRN: [id number] DOB/AGE: 1956-05-28 65 y.o.  Admit date: 08/10/2020 Discharge date: 08/11/2020  Admission Diagnoses:  Principal Problem:   Primary osteoarthritis of right knee Active Problems:   Status post total right knee replacement   Discharge Diagnoses:  Same  Past Medical History:  Diagnosis Date  . Allergy   . Anxiety   . Arthritis   . Bronchitis   . Cancer ([place])    skin  . Fibromyalgia   . GERD (gastroesophageal reflux disease)   . Hypertension   . Osteoarthritis     Surgeries: Procedure(s): RIGHT TOTAL KNEE ARTHROPLASTY on 08/10/2020   Consultants:   Discharged Condition: Improved  Hospital Course: Anna Mccarthy is an 65 y.o. female who was admitted 08/10/2020 for operative treatment ofPrimary osteoarthritis of right knee. Patient has severe unremitting pain that affects sleep, daily activities, and work/hobbies. After pre-op clearance the patient was taken to the operating room on 08/10/2020 and underwent  Procedure(s): RIGHT TOTAL KNEE ARTHROPLASTY.    Patient was given perioperative antibiotics:  Anti-infectives (From admission, onward)   Start     Dose/Rate Route Frequency Ordered Stop   08/10/20 2200  hydroxychloroquine (PLAQUENIL) tablet 200 mg        200 mg Oral 2 times daily 08/10/20 1721     08/10/20 1800  ceFAZolin (ANCEF) IVPB 2g/100 mL premix        2 g 200 mL/hr over 30 Minutes Intravenous Every 6 hours 08/10/20 1722 08/10/20 2352   08/10/20 1326  vancomycin (VANCOCIN) powder  Status:  Discontinued          As needed 08/10/20 1326 08/10/20 1447   08/10/20 1015  ceFAZolin (ANCEF) IVPB 2g/100 mL premix        2 g 200 mL/hr over 30 Minutes Intravenous On call to O.R. 08/10/20 1000 08/10/20 1250       Patient was given sequential compression devices, early ambulation, and chemoprophylaxis to prevent DVT.  Patient benefited maximally from hospital stay and there were no complications.    Recent vital signs:  Patient  Vitals for the past 24 hrs:  BP Temp Temp src Pulse Resp SpO2 Height Weight  08/11/20 0737 132/86 97.8 F (36.6 C) Oral 66 16 98 % -- --  08/11/20 0306 (!) 141/82 (!) 97.4 F (36.3 C) Oral 68 18 97 % -- --  08/10/20 2332 119/64 (!) 97.5 F (36.4 C) Oral 76 18 96 % -- --  08/10/20 2001 (!) 158/85 97.6 F (36.4 C) Oral 72 18 100 % -- --  08/10/20 1727 (!) 151/84 97.8 F (36.6 C) Oral 70 18 99 % -- --  08/10/20 1700 -- (!) 97 F (36.1 C) -- 73 10 94 % -- --  08/10/20 1655 (!) 139/92 -- -- 69 11 96 % -- --  08/10/20 1625 (!) 142/82 -- -- 69 10 91 % -- --  08/10/20 1610 (!) 149/88 -- -- 71 14 98 % -- --  08/10/20 1555 (!) 166/90 -- -- 72 14 100 % -- --  08/10/20 1525 132/86 -- -- 65 14 95 % -- --  08/10/20 1510 (!) 175/101 -- -- 85 15 97 % -- --  08/10/20 1455 (!) 178/105 97.6 F (36.4 C) -- 67 14 91 % -- --  08/10/20 1155 (!) 158/86 -- -- 67 -- 100 % -- --  08/10/20 1011 140/68 98.3 F (36.8 C) Oral 73 17 99 % 5\' 3"  (1.6 m) 78 kg     Recent  laboratory studies:  Recent Labs    08/11/20 0410  WBC 9.1  HGB 11.1*  HCT 33.6*  PLT 301  NA 131*  K 3.9  CL 96*  CO2 25  BUN 10  CREATININE 0.85  GLUCOSE 177*  CALCIUM 8.8*     Discharge Medications:   Allergies as of 08/11/2020      Reactions   Percocet [oxycodone-acetaminophen] Itching   Sulfa Antibiotics    Gi problems      Medication List    TAKE these medications   ALPRAZolam 0.25 MG tablet Commonly known as: XANAX Take 0.25 mg by mouth 2 (two) times daily as needed for anxiety.   aspirin EC 81 MG tablet Take 1 tablet (81 mg total) by mouth 2 (two) times daily. To be taken after surgery   docusate sodium 100 MG capsule Commonly known as: Colace Take 1 capsule (100 mg total) by mouth daily as needed.   DULoxetine 20 MG capsule Commonly known as: CYMBALTA Take 20 mg by mouth 2 (two) times daily.   escitalopram 20 MG tablet Commonly known as: LEXAPRO Take 20 mg by mouth at bedtime.   fluocinonide 0.05 %  external solution Commonly known as: LIDEX Apply 1 application topically 2 (two) times daily as needed (acne).   gabapentin 100 MG capsule Commonly known as: NEURONTIN Take 2 capsules (200 mg total) by mouth in the morning, at noon, and at bedtime.   hydrochlorothiazide 25 MG tablet Commonly known as: HYDRODIURIL Take 25 mg by mouth daily.   HYDROcodone-acetaminophen 7.5-325 MG tablet Commonly known as: Norco Take 1-2 tablets by mouth every 6 (six) hours as needed for moderate pain. To be taken after surgery   hydroxychloroquine 200 MG tablet Commonly known as: PLAQUENIL Take 200 mg by mouth 2 (two) times daily.   methocarbamol 500 MG tablet Commonly known as: Robaxin Take 1 tablet (500 mg total) by mouth 2 (two) times daily as needed. To be taken after surgery   omeprazole 40 MG capsule Commonly known as: PRILOSEC Take 40 mg by mouth daily.   ondansetron 4 MG tablet Commonly known as: Zofran Take 1 tablet (4 mg total) by mouth every 8 (eight) hours as needed for nausea or vomiting.   Vitamin D (Ergocalciferol) 1.25 MG (50000 UNIT) Caps capsule Commonly known as: DRISDOL Take 50,000 Units by mouth every Sunday.            Durable Medical Equipment  (From admission, onward)         Start     Ordered   08/10/20 1722  DME Walker rolling  Once       Question:  Patient needs a walker to treat with the following condition  Answer:  Total knee replacement status   08/10/20 1722   08/10/20 1722  DME 3 n 1  Once        02 /14/22 1722   08/10/20 1722  DME Bedside commode  Once       Question:  Patient needs a bedside commode to treat with the following condition  Answer:  Total knee replacement status   08/10/20 1722          Diagnostic Studies: DG Chest 2 View  Result Date: 08/06/2020 CLINICAL DATA:  Right total knee arthroplasty EXAM: CHEST - 2 VIEW COMPARISON:  01/07/2015 FINDINGS: Lungs are clear. No pneumothorax or pleural effusion. Rounded density within the  left medial lung base is unchanged from prior examination and may represent a small hiatal hernia or  eventration of the left hemidiaphragm. Cardiac size within normal limits. Pulmonary vascularity is normal. Osseous structures are age-appropriate. IMPRESSION: No active cardiopulmonary disease. Electronically Signed   By: Fidela Salisbury MD   On: 08/06/2020 15:33   DG Knee Right Port  Result Date: 08/10/2020 CLINICAL DATA:  Status post right knee replacement EXAM: PORTABLE RIGHT KNEE - 2 VIEW COMPARISON:  01/30/2020 FINDINGS: Right knee replacement is now seen in satisfactory position. No acute bony or soft tissue abnormality is noted. IMPRESSION: Status post right knee replacement. Electronically Signed   By: Inez Catalina M.D.   On: 08/10/2020 15:18    Disposition: Discharge disposition: 01-Home or Self Care          Follow-up Information    Leandrew Koyanagi, MD. Schedule an appointment as soon as possible for a visit in 2 weeks.   Specialty: Orthopedic Surgery Contact information: [address] [place] [address] [phone]                Signed: Aundra Dubin 08/11/2020, 8:01 AM

## 2020-08-14 ENCOUNTER — Other Ambulatory Visit: Payer: Self-pay

## 2020-08-14 ENCOUNTER — Telehealth: Payer: Self-pay | Admitting: Orthopaedic Surgery

## 2020-08-14 DIAGNOSIS — M1711 Unilateral primary osteoarthritis, right knee: Secondary | ICD-10-CM

## 2020-08-14 NOTE — Telephone Encounter (Signed)
Patient called asked when will the outpatient (PT) orders be put in? Patient said she would like to come to [facility] for (PT) The number to contact patient is     [phone]

## 2020-08-14 NOTE — Telephone Encounter (Signed)
Advised her I can put order in now. She does have f/u appt with Erlinda Hong 3/1 can sched PT after 3/1

## 2020-08-17 ENCOUNTER — Telehealth: Payer: Self-pay | Admitting: Orthopaedic Surgery

## 2020-08-17 NOTE — Telephone Encounter (Signed)
Please advise 

## 2020-08-17 NOTE — Telephone Encounter (Signed)
Pt called asking for a refill of her norco rx; she would like to be notified when it's been sent.

## 2020-08-18 ENCOUNTER — Other Ambulatory Visit: Payer: Self-pay | Admitting: Physician Assistant

## 2020-08-18 MED ORDER — HYDROCODONE-ACETAMINOPHEN 7.5-325 MG PO TABS
1.0000 | ORAL_TABLET | Freq: Four times a day (QID) | ORAL | 0 refills | Status: DC | PRN
Start: 2020-08-18 — End: 2020-08-25

## 2020-08-18 NOTE — Telephone Encounter (Signed)
Sent in

## 2020-08-18 NOTE — Telephone Encounter (Signed)
I called patient and advised. 

## 2020-08-25 ENCOUNTER — Encounter: Payer: Self-pay | Admitting: Physical Therapy

## 2020-08-25 ENCOUNTER — Ambulatory Visit (INDEPENDENT_AMBULATORY_CARE_PROVIDER_SITE_OTHER): Payer: Medicare Other | Admitting: Physician Assistant

## 2020-08-25 ENCOUNTER — Other Ambulatory Visit: Payer: Self-pay

## 2020-08-25 ENCOUNTER — Encounter: Payer: Self-pay | Admitting: Orthopaedic Surgery

## 2020-08-25 ENCOUNTER — Ambulatory Visit (INDEPENDENT_AMBULATORY_CARE_PROVIDER_SITE_OTHER): Payer: Medicare Other | Admitting: Physical Therapy

## 2020-08-25 DIAGNOSIS — M25661 Stiffness of right knee, not elsewhere classified: Secondary | ICD-10-CM | POA: Diagnosis not present

## 2020-08-25 DIAGNOSIS — M6281 Muscle weakness (generalized): Secondary | ICD-10-CM

## 2020-08-25 DIAGNOSIS — R2681 Unsteadiness on feet: Secondary | ICD-10-CM | POA: Diagnosis not present

## 2020-08-25 DIAGNOSIS — M1712 Unilateral primary osteoarthritis, left knee: Secondary | ICD-10-CM

## 2020-08-25 DIAGNOSIS — R600 Localized edema: Secondary | ICD-10-CM

## 2020-08-25 DIAGNOSIS — M25561 Pain in right knee: Secondary | ICD-10-CM | POA: Diagnosis not present

## 2020-08-25 DIAGNOSIS — Z96651 Presence of right artificial knee joint: Secondary | ICD-10-CM

## 2020-08-25 DIAGNOSIS — R2689 Other abnormalities of gait and mobility: Secondary | ICD-10-CM | POA: Diagnosis not present

## 2020-08-25 MED ORDER — HYDROCODONE-ACETAMINOPHEN 5-325 MG PO TABS: 1.0000 | ORAL_TABLET | Freq: Three times a day (TID) | ORAL | 0 refills | Status: DC | PRN

## 2020-08-25 MED ORDER — DICLOFENAC SODIUM 1 % EX GEL: 2.0000 g | Freq: Four times a day (QID) | CUTANEOUS | 0 refills | Status: DC

## 2020-08-25 NOTE — Therapy (Signed)
[place], [place], [address] Phone: [phone]   Fax:  [phone]  Physical Therapy Evaluation  Patient Details  Name: Anna Mccarthy MRN: [id number] Date of Birth: 1956-02-09 Referring Provider (PT): Frankey Shown, MD   Encounter Date: 08/25/2020   PT End of Session - 08/25/20 1642    Visit Number 1    Number of Visits 16    Date for PT Re-Evaluation 10/16/20    Authorization Type Medicare A&B and AARP    PT Start Time 3790    PT Stop Time 1430    PT Time Calculation (min) 45 min    Activity Tolerance Patient tolerated treatment well    Behavior During Therapy [facility] for tasks assessed/performed           Past Medical History:  Diagnosis Date  . Allergy   . Anxiety   . Arthritis   . Bronchitis   . Cancer ([place])    skin  . Fibromyalgia   . GERD (gastroesophageal reflux disease)   . Hypertension   . Osteoarthritis     Past Surgical History:  Procedure Laterality Date  . ABDOMINAL HYSTERECTOMY  1996  . BREAST BIOPSY Left 2011   stereo done by dr. Bary Castilla -benign  . COLONOSCOPY    . COLONOSCOPY WITH PROPOFOL N/A 05/30/2016   Procedure: COLONOSCOPY WITH PROPOFOL;  Surgeon: Lollie Sails, MD;  Location: [facility] ENDOSCOPY;  Service: Endoscopy;  Laterality: N/A;  . DILATION AND CURETTAGE OF UTERUS     x3  . HERNIA REPAIR  2012  . KNEE ARTHROSCOPY WITH MEDIAL MENISECTOMY Left 02/18/2016   Procedure: KNEE ARTHROSCOPY WITH PARTIAL MEDIAL MENISECTOMY;  Surgeon: Hessie Knows, MD;  Location: [facility] ORS;  Service: Orthopedics;  Laterality: Left;  . TOTAL KNEE ARTHROPLASTY Right 08/10/2020   Procedure: RIGHT TOTAL KNEE ARTHROPLASTY;  Surgeon: Leandrew Koyanagi, MD;  Location: [place];  Service: Orthopedics;  Laterality: Right;  . TUBAL LIGATION  1984    There were no vitals filed for this visit.    Subjective Assessment - 08/25/20 1340    Subjective This 65yo female was referred to PT by Leandrew Koyanagi, MD with M17.11  (ICD-10-CM) - Primary osteoarthritis of right knee. She underwent a right Total Knee Arthroplasty on 08/10/2020. She had HHPT until 2/25. There are plans to have left knee replaced after she recovers.    Pertinent History anxiety, arthritis, skin CA, fibromylagia, HTN,    Patient Stated Goals improve strength in both legs.  power walk with enough stamina.  improve balance.    Currently in Pain? Yes    Pain Score 4    in last week, worst 8-9/10 & best 2/10   Pain Location Knee    Pain Orientation Right;Medial;Lateral   including thigh / quad and tibialis anterior   Pain Descriptors / Indicators Aching    Pain Type Acute pain;Surgical pain    Pain Onset 1 to 4 weeks ago    Pain Frequency Intermittent    Aggravating Factors  not taking pain medications properly,    Pain Relieving Factors medications              [facility] PT Assessment - 08/25/20 1345      Assessment   Medical Diagnosis Right Knee Arthroplasty    Referring Provider (PT) Frankey Shown, MD    Onset Date/Surgical Date 08/10/20    Hand Dominance Right    Prior Therapy HHPT until 2/25      Precautions  Precautions Fall      Restrictions   Weight Bearing Restrictions No      Balance Screen   Has the patient fallen in the past 6 months No    Has the patient had a decrease in activity level because of a fear of falling?  Yes    Is the patient reluctant to leave their home because of a fear of falling?  No      Home Environment   Living Environment Private residence    Living Arrangements Spouse/significant other    Type of [place] to enter    Entrance Stairs-Number of Steps [place] One level    [place] - 2 wheels;Tub bench      Prior Function   Level of Independence Independent;Independent with household mobility without device;Independent with community mobility without device    Vocation Retired    Leisure power walking, gardening, yard  work, Education administrator      Observation/Other Assessments   Focus on Therapeutic Outcomes (FOTO)  46.7065      Observation/Other Assessments-Edema    Edema Circumferential      Circumferential Edema   Circumferential - Right superior patella 49.8cm  inferior patella 43.0 cm    Circumferential - Left  superior patella 48.0 cm  inferior patella 40.2 cm      ROM / Strength   AROM / PROM / Strength AROM;PROM;Strength      AROM   AROM Assessment Site Knee    Right/Left Knee Right;Left    Right Knee Extension 0   seated LAQ   Right Knee Flexion 86   86* seated and 70* standing   Left Knee Extension -6   seated LAQ   Left Knee Flexion 124   124* seated & 94* standing     PROM   PROM Assessment Site Knee    Right/Left Knee Right;Left    Right Knee Flexion 92      Strength   Right Knee Flexion 3-/5    Right Knee Extension 3-/5      Transfers   Transfers Sit to Stand;Stand to Sit    Sit to Stand 5: Supervision;With upper extremity assist;With armrests;From chair/3-in-1    Sit to Stand Details (indicate cue type and reason) uses LLE >RLE    Stand to Sit 5: Supervision;With upper extremity assist;With armrests;To chair/3-in-1    Stand to Sit Details uses LLE >RLE      Ambulation/Gait   Ambulation/Gait Yes    Assistive device None    Gait Pattern Step-to pattern;Decreased arm swing - right;Decreased step length - left;Decreased stance time - right;Decreased hip/knee flexion - right;Decreased weight shift to right;Right flexed knee in stance;Antalgic;Abducted- right                      Objective measurements completed on examination: See above findings.               PT Education - 08/25/20 1642    Education Details HEP Medbridge Access Code: [email or web address]    Person(s) Educated Patient    Methods Explanation;Demonstration;Tactile cues;Verbal cues;Handout    Comprehension Verbalized understanding;Returned demonstration;Verbal cues required;Tactile cues required;Need  further instruction            PT Short Term Goals - 08/25/20 1650      PT SHORT TERM GOAL #1   Title Patient demonstrates understanding of HEP for  first 4 weeks.    Time 4    Period Weeks    Status New    Target Date 09/18/20      PT SHORT TERM GOAL #2   Title right knee PROM 0* - 100*    Time 4    Period Weeks    Status New    Target Date 09/18/20      PT SHORT TERM GOAL #3   Title FOTO score improves to >50% functional by 10th visit.    Time 5    Period Weeks    Status New    Target Date --      PT SHORT TERM GOAL #4   Title --    Time --    Period --    Status --    Target Date --      PT SHORT TERM GOAL #5   Title --    Time --    Period --    Status --    Target Date --             PT Long Term Goals - 08/25/20 1653      PT LONG TERM GOAL #1   Title FOTO score 62% functional    Time 8    Period Weeks    Status New    Target Date 10/16/20      PT LONG TERM GOAL #2   Title right knee Pain </= 2/10 with activities.    Time 8    Period Weeks    Status New    Target Date 10/16/20      PT LONG TERM GOAL #3   Title right knee PROM 0* - 110*    Time 8    Period Weeks    Status New    Target Date 10/16/20      PT LONG TERM GOAL #4   Title AROM right knee extension seated -2* and flexion standing 85*    Time 8    Period Weeks    Status New    Target Date 10/16/20      PT LONG TERM GOAL #5   Title Patient ambulates >500' and negotiates ramps & curbs without device independently.    Time 8    Period Weeks    Status New    Target Date 10/16/20                  Plan - 08/25/20 1645    Clinical Impression Statement This 65yo female underwent a right Total Knee Replacement 15 days prior to PT evaluation.  She has pain & edema in right knee limiting mobility. She has decreased range & strength in right knee.  She ambulates without device but has gait deviations indicating fall risk.  Patient would benefit from skilled PT to improve  functional mobility.    Personal Factors and Comorbidities Comorbidity 3+    Comorbidities anxiety, arthritis, skin CA, fibromylagia, HTN    Examination-Activity Limitations Lift;Locomotion Level;Squat;Stairs;Stand;Transfers    Examination-Participation Restrictions Community Activity;Driving;Yard Work    Stability/Clinical Decision Making Stable/Uncomplicated    Designer, jewellery Low    Rehab Potential Good    PT Frequency 2x / week    PT Duration 8 weeks    PT Treatment/Interventions ADLs/Self Care Home Management;Electrical Stimulation;Cryotherapy;DME Instruction;Gait training;Stair training;Functional mobility training;Therapeutic activities;Therapeutic exercise;Balance training;Neuromuscular re-education;Patient/family education;Manual techniques;Scar mobilization;Passive range of motion;Dry needling;Taping;Vasopneumatic Device;Vestibular;Joint Manipulations    PT Next Visit Plan check & update HEP, manual therapy &  exercise to increase range, vaso for edema    PT Home Exercise Plan Access Code: [email or web address]    Consulted and Agree with Plan of Care Patient           Patient will benefit from skilled therapeutic intervention in order to improve the following deficits and impairments:  Abnormal gait,Decreased activity tolerance,Decreased balance,Decreased mobility,Decreased range of motion,Decreased scar mobility,Decreased skin integrity,Decreased strength,Increased edema,Impaired flexibility,Pain  Visit Diagnosis: Acute pain of right knee  Stiffness of right knee, not elsewhere classified  Other abnormalities of gait and mobility  Unsteadiness on feet  Muscle weakness (generalized)  Localized edema     Problem List Patient Active Problem List   Diagnosis Date Noted  . Status post total right knee replacement 08/10/2020  . Primary osteoarthritis of right knee 01/30/2020  . Chronic pain of left knee 09/11/2018  . Meniscus degeneration, left 09/11/2018  . Fibromyalgia  09/11/2018  . Chronic pain syndrome 09/11/2018  . Venous ulcer ([place]) 02/02/2017  . Varicose veins of bilateral lower extremities with pain 05/16/2016  . Chronic venous insufficiency 05/16/2016  . Pain in limb 05/16/2016  . Swelling of limb 05/16/2016  . DJD (degenerative joint disease) 05/16/2016    Jamey Reas PT, DPT 08/25/2020, 5:00 PM  [facility] Physical Therapy [address] [place], [place], [address] Phone: [phone]   Fax:  [phone]  Name: Anna Mccarthy MRN: [id number] Date of Birth: 07/10/1955

## 2020-08-25 NOTE — Progress Notes (Signed)
Office Visit Note   Patient: Anna Mccarthy           Date of Birth: 03/01/1956           MRN: [id number] Visit Date: 08/25/2020              Requested by: Leandrew Koyanagi, [place],  [place] [address] PCP: [place] Pink, MD   Assessment & Plan: Visit Diagnoses:  1. Hx of total knee replacement, right   2. Unilateral primary osteoarthritis, left knee     Plan: Impression is 2 weeks status post right total knee replacement and advanced degenerative joint disease left knee.  In regards to her right knee, Steri-Strips were applied today.  She will continue with outpatient physical therapy as well as her home exercise program.  Norco refilled.  Dental prophylaxis reinforced.  Follow-up with [place] in 4 weeks time for repeat evaluation and 2 view x-rays of the right knee.  Follow-Up Instructions: Return in about 4 weeks (around 09/22/2020).   Orders:  Orders Placed This Encounter  Procedures  . Large Joint Inj: L knee   Meds ordered this encounter  Medications  . HYDROcodone-acetaminophen (NORCO) 5-325 MG tablet    Sig: Take 1-2 tablets by mouth 3 (three) times daily as needed.    Dispense:  30 tablet    Refill:  0  . diclofenac Sodium (VOLTAREN) 1 % GEL    Sig: Apply 2 g topically 4 (four) times daily.    Dispense:  150 g    Refill:  0      Procedures: Large Joint Inj: L knee on 08/25/2020 10:56 AM Indications: pain Details: 22 G needle, anterolateral approach Medications: 2 mL lidocaine 1 %; 2 mL bupivacaine 0.25 %; 40 mg methylPREDNISolone acetate 40 MG/ML      Clinical Data: No additional findings.   Subjective: Chief Complaint  Patient presents with  . Right Knee - Pain, Routine Post Op    HPI patient is a pleasant 65 year old female who comes in today 2 weeks out right total knee replacement.  She has been doing well.  She has finished home health physical therapy and is scheduled to start outpatient therapy today.  She has been in mild to  moderate pain which is primarily relieved with Norco.  Other issue she brings up today is her left knee.  History of advanced degenerative joint disease there for which she has had cortisone injections in the past.  She has noticed increased pain over the past few weeks to the left knee.  She is worried this will give out on her.  Review of Systems as detailed in HPI.  All others reviewed and are negative.   Objective: Vital Signs: There were no vitals taken for this visit.  Physical Exam well-developed well-nourished female no acute distress.  Alert oriented x3.  Ortho Exam right knee exam shows a well-healing surgical incision without complication.  Calf is soft nontender.  She is neurovascular intact distally.  Left knee exam shows a trace effusion.  Range of motion 0 to 125 degrees.  Medial and lateral joint line tenderness.  Moderate patellofemoral crepitus.  Ligaments are stable.  She is neurovascular intact distally.  Specialty Comments:  No specialty comments available.  Imaging: No new imaging   PMFS History: Patient Active Problem List   Diagnosis Date Noted  . Status post total right knee replacement 08/10/2020  . Primary osteoarthritis of right knee 01/30/2020  . Chronic pain of  left knee 09/11/2018  . Meniscus degeneration, left 09/11/2018  . Fibromyalgia 09/11/2018  . Chronic pain syndrome 09/11/2018  . Venous ulcer ([place]) 02/02/2017  . Varicose veins of bilateral lower extremities with pain 05/16/2016  . Chronic venous insufficiency 05/16/2016  . Pain in limb 05/16/2016  . Swelling of limb 05/16/2016  . DJD (degenerative joint disease) 05/16/2016   Past Medical History:  Diagnosis Date  . Allergy   . Anxiety   . Arthritis   . Bronchitis   . Cancer ([place])    skin  . Fibromyalgia   . GERD (gastroesophageal reflux disease)   . Hypertension   . Osteoarthritis     Family History  Problem Relation Age of Onset  . Breast cancer Maternal Aunt   . Ovarian cancer  Sister   . Breast cancer Cousin     Past Surgical History:  Procedure Laterality Date  . ABDOMINAL HYSTERECTOMY  1996  . BREAST BIOPSY Left 2011   stereo done by dr. Bary Castilla -benign  . COLONOSCOPY    . COLONOSCOPY WITH PROPOFOL N/A 05/30/2016   Procedure: COLONOSCOPY WITH PROPOFOL;  Surgeon: Lollie Sails, MD;  Location: [facility] ENDOSCOPY;  Service: Endoscopy;  Laterality: N/A;  . DILATION AND CURETTAGE OF UTERUS     x3  . HERNIA REPAIR  2012  . KNEE ARTHROSCOPY WITH MEDIAL MENISECTOMY Left 02/18/2016   Procedure: KNEE ARTHROSCOPY WITH PARTIAL MEDIAL MENISECTOMY;  Surgeon: Hessie Knows, MD;  Location: [facility] ORS;  Service: Orthopedics;  Laterality: Left;  . TOTAL KNEE ARTHROPLASTY Right 08/10/2020   Procedure: RIGHT TOTAL KNEE ARTHROPLASTY;  Surgeon: Leandrew Koyanagi, MD;  Location: [place];  Service: Orthopedics;  Laterality: Right;  . TUBAL LIGATION  1984   Social History   Occupational History  . Not on file  Tobacco Use  . Smoking status: Never Smoker  . Smokeless tobacco: Never Used  Vaping Use  . Vaping Use: Never used  Substance and Sexual Activity  . Alcohol use: Yes    Alcohol/week: 2.0 standard drinks    Types: 2 Glasses of wine per week  . Drug use: No  . Sexual activity: Not on file

## 2020-08-25 NOTE — Patient Instructions (Signed)
Access Code: [email or web address] URL: [email or web address] Date: 08/25/2020 Prepared by: Jamey Reas  Exercises Quad Setting and Stretching - 2-4 x daily - 7 x weekly - 5-10 sets - 10 reps - prop 5-10 minutes & quad set5 seconds hold Supine Heel Slide with Strap - 2-3 x daily - 7 x weekly - 2-3 sets - 10 reps - 5 seconds hold Supine Heel Slide with Small Ball - 2-3 x daily - 7 x weekly - 2-3 sets - 10 reps - 5 seconds hold Supine Straight Leg Raises - 2-3 x daily - 7 x weekly - 2-3 sets - 10 reps - 5 seconds hold Seated Knee Flexion Extension AROM - 2-4 x daily - 7 x weekly - 2-3 sets - 10 reps - 5 seconds hold Seated straight leg lifts - 2-3 x daily - 7 x weekly - 2-3 sets - 10 reps - 5 seconds hold Seated Hamstring Stretch with Strap - 2-4 x daily - 7 x weekly - 1 sets - 3 reps - 20-30 seconds hold Standing Gastroc Stretch - 2-4 x daily - 7 x weekly - 1 sets - 3 reps - 20-30 seconds hold

## 2020-08-26 MED ORDER — METHYLPREDNISOLONE ACETATE 40 MG/ML IJ SUSP
40.0000 mg | INTRAMUSCULAR | Status: AC | PRN
  Administered 2020-08-25: 40 mg via INTRA_ARTICULAR

## 2020-08-26 MED ORDER — BUPIVACAINE HCL 0.25 % IJ SOLN
2.0000 mL | INTRAMUSCULAR | Status: AC | PRN
  Administered 2020-08-25: 2 mL via INTRA_ARTICULAR

## 2020-08-26 MED ORDER — LIDOCAINE HCL 1 % IJ SOLN
2.0000 mL | INTRAMUSCULAR | Status: AC | PRN
  Administered 2020-08-25: 2 mL

## 2020-08-27 ENCOUNTER — Encounter: Payer: Self-pay | Admitting: Physical Therapy

## 2020-08-27 ENCOUNTER — Ambulatory Visit (INDEPENDENT_AMBULATORY_CARE_PROVIDER_SITE_OTHER): Payer: Medicare Other | Admitting: Physical Therapy

## 2020-08-27 ENCOUNTER — Other Ambulatory Visit: Payer: Self-pay

## 2020-08-27 DIAGNOSIS — M25661 Stiffness of right knee, not elsewhere classified: Secondary | ICD-10-CM

## 2020-08-27 DIAGNOSIS — M6281 Muscle weakness (generalized): Secondary | ICD-10-CM

## 2020-08-27 DIAGNOSIS — R2681 Unsteadiness on feet: Secondary | ICD-10-CM

## 2020-08-27 DIAGNOSIS — R2689 Other abnormalities of gait and mobility: Secondary | ICD-10-CM

## 2020-08-27 DIAGNOSIS — R600 Localized edema: Secondary | ICD-10-CM

## 2020-08-27 DIAGNOSIS — M25561 Pain in right knee: Secondary | ICD-10-CM

## 2020-08-27 NOTE — Therapy (Signed)
[facility] Physical Therapy [address] [place], [place], [address] Phone: [phone]   Fax:  [phone]  Physical Therapy Treatment  Patient Details  Name: Anna Mccarthy MRN: [id number] Date of Birth: 05-10-1956 Referring Provider (PT): Frankey Shown, MD   Encounter Date: 08/27/2020   PT End of Session - 08/27/20 1224    Visit Number 2    Number of Visits 16    Date for PT Re-Evaluation 10/16/20    Authorization Type Medicare A&B and AARP    PT Start Time 1138    PT Stop Time 1228    PT Time Calculation (min) 50 min    Activity Tolerance Patient tolerated treatment well    Behavior During Therapy [facility] for tasks assessed/performed           Past Medical History:  Diagnosis Date  . Allergy   . Anxiety   . Arthritis   . Bronchitis   . Cancer ([place])    skin  . Fibromyalgia   . GERD (gastroesophageal reflux disease)   . Hypertension   . Osteoarthritis     Past Surgical History:  Procedure Laterality Date  . ABDOMINAL HYSTERECTOMY  1996  . BREAST BIOPSY Left 2011   stereo done by dr. Bary Castilla -benign  . COLONOSCOPY    . COLONOSCOPY WITH PROPOFOL N/A 05/30/2016   Procedure: COLONOSCOPY WITH PROPOFOL;  Surgeon: Lollie Sails, MD;  Location: [facility] ENDOSCOPY;  Service: Endoscopy;  Laterality: N/A;  . DILATION AND CURETTAGE OF UTERUS     x3  . HERNIA REPAIR  2012  . KNEE ARTHROSCOPY WITH MEDIAL MENISECTOMY Left 02/18/2016   Procedure: KNEE ARTHROSCOPY WITH PARTIAL MEDIAL MENISECTOMY;  Surgeon: Hessie Knows, MD;  Location: [facility] ORS;  Service: Orthopedics;  Laterality: Left;  . TOTAL KNEE ARTHROPLASTY Right 08/10/2020   Procedure: RIGHT TOTAL KNEE ARTHROPLASTY;  Surgeon: Leandrew Koyanagi, MD;  Location: [place];  Service: Orthopedics;  Laterality: Right;  . TUBAL LIGATION  1984    There were no vitals filed for this visit.   Subjective Assessment - 08/27/20 1138    Subjective knee is stiff today, took some advil this morning.  more active yesterday "It  felt like a normal day."    Pertinent History anxiety, arthritis, skin CA, fibromylagia, HTN,    Patient Stated Goals improve strength in both legs.  power walk with enough stamina.  improve balance.    Currently in Pain? Yes    Pain Score 0-No pain   just stiff   Pain Location Knee    Pain Orientation Right    Pain Descriptors / Indicators Tightness    Pain Type Acute pain;Surgical pain    Pain Onset 1 to 4 weeks ago    Pain Frequency Intermittent    Aggravating Factors  walking, bending    Pain Relieving Factors meds                             [facility] Adult PT Treatment/Exercise - 08/27/20 1142      Exercises   Exercises Knee/Hip      Knee/Hip Exercises: Stretches   Passive Hamstring Stretch Right;3 reps;30 seconds    Passive Hamstring Stretch Limitations supine with strap; overpressure at distal thigh    Knee: Self-Stretch Limitations 10 x 10 sec with overpressure from Lt      Knee/Hip Exercises: Aerobic   Recumbent Bike partial revolutions x 8 min; seat 4  Knee/Hip Exercises: Seated   Long Arc Quad Both;2 sets;10 reps    Long Arc Quad Weight 2 lbs.    Other Seated Knee/Hip Exercises seated SLR x10 reps on Rt    Sit to Sand 10 reps;without UE support      Knee/Hip Exercises: Supine   Quad Sets Right;10 reps    Heel Slides AAROM;Right    Straight Leg Raises Both;20 reps      Modalities   Modalities Vasopneumatic      Vasopneumatic   Number Minutes Vasopneumatic  10 minutes    Vasopnuematic Location  Knee    Vasopneumatic Pressure Medium    Vasopneumatic Temperature  34      Manual Therapy   Manual Therapy Passive ROM    Passive ROM seated knee flexion with LLE LAQ 3x10                    PT Short Term Goals - 08/25/20 1650      PT SHORT TERM GOAL #1   Title Patient demonstrates understanding of HEP for first 4 weeks.    Time 4    Period Weeks    Status New    Target Date 09/18/20      PT SHORT TERM GOAL #2   Title right  knee PROM 0* - 100*    Time 4    Period Weeks    Status New    Target Date 09/18/20      PT SHORT TERM GOAL #3   Title FOTO score improves to >50% functional by 10th visit.    Time 5    Period Weeks    Status New    Target Date --      PT SHORT TERM GOAL #4   Title --    Time --    Period --    Status --    Target Date --      PT SHORT TERM GOAL #5   Title --    Time --    Period --    Status --    Target Date --             PT Long Term Goals - 08/25/20 1653      PT LONG TERM GOAL #1   Title FOTO score 62% functional    Time 8    Period Weeks    Status New    Target Date 10/16/20      PT LONG TERM GOAL #2   Title right knee Pain </= 2/10 with activities.    Time 8    Period Weeks    Status New    Target Date 10/16/20      PT LONG TERM GOAL #3   Title right knee PROM 0* - 110*    Time 8    Period Weeks    Status New    Target Date 10/16/20      PT LONG TERM GOAL #4   Title AROM right knee extension seated -2* and flexion standing 85*    Time 8    Period Weeks    Status New    Target Date 10/16/20      PT LONG TERM GOAL #5   Title Patient ambulates >500' and negotiates ramps & curbs without device independently.    Time 8    Period Weeks    Status New    Target Date 10/16/20  Plan - 08/27/20 1224    Clinical Impression Statement Pt demonstrating visible improvement in PROM today, and will formally measure next visit.  Independent with initial HEP, and added additional hamstring stretch as option for pt.  Will continue to benefit from PT to maximize function.    Personal Factors and Comorbidities Comorbidity 3+    Comorbidities anxiety, arthritis, skin CA, fibromylagia, HTN    Examination-Activity Limitations Lift;Locomotion Level;Squat;Stairs;Stand;Transfers    Examination-Participation Restrictions Community Activity;Driving;Yard Work    Stability/Clinical Decision Making Stable/Uncomplicated    Rehab Potential Good     PT Frequency 2x / week    PT Duration 8 weeks    PT Treatment/Interventions ADLs/Self Care Home Management;Electrical Stimulation;Cryotherapy;DME Instruction;Gait training;Stair training;Functional mobility training;Therapeutic activities;Therapeutic exercise;Balance training;Neuromuscular re-education;Patient/family education;Manual techniques;Scar mobilization;Passive range of motion;Dry needling;Taping;Vasopneumatic Device;Vestibular;Joint Manipulations    PT Next Visit Plan check & update HEP PRN, manual therapy & exercise to increase range, vaso for edema    PT Home Exercise Plan Access Code: [email or web address]    Consulted and Agree with Plan of Care Patient           Patient will benefit from skilled therapeutic intervention in order to improve the following deficits and impairments:  Abnormal gait,Decreased activity tolerance,Decreased balance,Decreased mobility,Decreased range of motion,Decreased scar mobility,Decreased skin integrity,Decreased strength,Increased edema,Impaired flexibility,Pain  Visit Diagnosis: Acute pain of right knee  Stiffness of right knee, not elsewhere classified  Other abnormalities of gait and mobility  Unsteadiness on feet  Muscle weakness (generalized)  Localized edema     Problem List Patient Active Problem List   Diagnosis Date Noted  . Status post total right knee replacement 08/10/2020  . Primary osteoarthritis of right knee 01/30/2020  . Chronic pain of left knee 09/11/2018  . Meniscus degeneration, left 09/11/2018  . Fibromyalgia 09/11/2018  . Chronic pain syndrome 09/11/2018  . Venous ulcer ([place]) 02/02/2017  . Varicose veins of bilateral lower extremities with pain 05/16/2016  . Chronic venous insufficiency 05/16/2016  . Pain in limb 05/16/2016  . Swelling of limb 05/16/2016  . DJD (degenerative joint disease) 05/16/2016      Laureen Abrahams, PT, DPT 08/27/20 12:27 PM    [facility] Physical Therapy [address] [place], [place], [address] Phone: [phone]   Fax:  [phone]  Name: BRECKEN WALTH MRN: [id number] Date of Birth: Jun 11, 1956

## 2020-08-27 NOTE — Patient Instructions (Signed)
Access Code: [email or web address] URL: [email or web address] Date: 08/27/2020 Prepared by: Faustino Congress  Exercises Quad Setting and Stretching - 2-4 x daily - 7 x weekly - 5-10 sets - 10 reps - prop 5-10 minutes & quad set5 seconds hold Supine Heel Slide with Strap - 2-3 x daily - 7 x weekly - 2-3 sets - 10 reps - 5 seconds hold Supine Heel Slide with Small Ball - 2-3 x daily - 7 x weekly - 2-3 sets - 10 reps - 5 seconds hold Supine Straight Leg Raises - 2-3 x daily - 7 x weekly - 2-3 sets - 10 reps - 5 seconds hold Seated Knee Flexion Extension AROM - 2-4 x daily - 7 x weekly - 2-3 sets - 10 reps - 5 seconds hold Seated straight leg lifts - 2-3 x daily - 7 x weekly - 2-3 sets - 10 reps - 5 seconds hold Standing Gastroc Stretch - 2-4 x daily - 7 x weekly - 1 sets - 3 reps - 20-30 seconds hold Seated Hamstring Stretch with Strap - 2-4 x daily - 7 x weekly - 1 sets - 3 reps - 20-30 seconds hold Supine Hamstring Stretch with Strap - 2-4 x daily - 7 x weekly - 1 sets - 3 reps - 30 sec hold

## 2020-08-31 ENCOUNTER — Other Ambulatory Visit: Payer: Self-pay

## 2020-08-31 ENCOUNTER — Ambulatory Visit (INDEPENDENT_AMBULATORY_CARE_PROVIDER_SITE_OTHER): Payer: Medicare Other | Admitting: Physical Therapy

## 2020-08-31 ENCOUNTER — Encounter: Payer: Self-pay | Admitting: Physical Therapy

## 2020-08-31 DIAGNOSIS — R2689 Other abnormalities of gait and mobility: Secondary | ICD-10-CM | POA: Diagnosis not present

## 2020-08-31 DIAGNOSIS — M25661 Stiffness of right knee, not elsewhere classified: Secondary | ICD-10-CM

## 2020-08-31 DIAGNOSIS — M25561 Pain in right knee: Secondary | ICD-10-CM | POA: Diagnosis not present

## 2020-08-31 DIAGNOSIS — R2681 Unsteadiness on feet: Secondary | ICD-10-CM | POA: Diagnosis not present

## 2020-08-31 DIAGNOSIS — M6281 Muscle weakness (generalized): Secondary | ICD-10-CM

## 2020-08-31 DIAGNOSIS — R600 Localized edema: Secondary | ICD-10-CM

## 2020-08-31 NOTE — Patient Instructions (Signed)
Access Code: [email or web address] URL: [email or web address] Date: 08/31/2020 Prepared by: Jamey Reas  Exercises Quad Setting and Stretching - 2-4 x daily - 7 x weekly - 5-10 sets - 10 reps - prop 5-10 minutes & quad set5 seconds hold Supine Heel Slide with Strap - 2-3 x daily - 7 x weekly - 2-3 sets - 10 reps - 5 seconds hold Supine Single Leg Hip Flexion with Knee Bent - 1 x daily - 7 x weekly - 1 sets - 10 reps - 5 seconds hold Supine Heel Slide with Small Ball - 2-3 x daily - 7 x weekly - 2-3 sets - 10 reps - 5 seconds hold Supine Straight Leg Raises - 2-3 x daily - 7 x weekly - 2-3 sets - 10 reps - 5 seconds hold Supine Quadriceps Stretch with Strap on Table - 1 x daily - 5 x weekly - 1 sets - 10 reps - 5 seconds hold Seated Knee Flexion Extension AROM - 2-4 x daily - 7 x weekly - 2-3 sets - 10 reps - 5 seconds hold Seated straight leg lifts - 2-3 x daily - 7 x weekly - 2-3 sets - 10 reps - 5 seconds hold Standing Gastroc Stretch - 2-4 x daily - 7 x weekly - 1 sets - 3 reps - 20-30 seconds hold Seated Hamstring Stretch with Strap - 2-4 x daily - 7 x weekly - 1 sets - 3 reps - 20-30 seconds hold  Patient Education Scar Massage

## 2020-08-31 NOTE — Therapy (Signed)
[place], [place], [address] Phone: [phone]   Fax:  [phone]  Physical Therapy Treatment  Patient Details  Name: Anna Mccarthy MRN: [id number] Date of Birth: 07-31-1955 Referring Provider (PT): Frankey Shown, MD   Encounter Date: 08/31/2020   PT End of Session - 08/31/20 1427    Visit Number 3    Number of Visits 16    Date for PT Re-Evaluation 10/16/20    Authorization Type Medicare A&B and AARP    Progress Note Due on Visit 10    PT Start Time 1425    PT Stop Time 1525    PT Time Calculation (min) 60 min    Activity Tolerance Patient tolerated treatment well    Behavior During Therapy [facility] for tasks assessed/performed           Past Medical History:  Diagnosis Date  . Allergy   . Anxiety   . Arthritis   . Bronchitis   . Cancer ([place])    skin  . Fibromyalgia   . GERD (gastroesophageal reflux disease)   . Hypertension   . Osteoarthritis     Past Surgical History:  Procedure Laterality Date  . ABDOMINAL HYSTERECTOMY  1996  . BREAST BIOPSY Left 2011   stereo done by dr. Bary Castilla -benign  . COLONOSCOPY    . COLONOSCOPY WITH PROPOFOL N/A 05/30/2016   Procedure: COLONOSCOPY WITH PROPOFOL;  Surgeon: Lollie Sails, MD;  Location: [facility] ENDOSCOPY;  Service: Endoscopy;  Laterality: N/A;  . DILATION AND CURETTAGE OF UTERUS     x3  . HERNIA REPAIR  2012  . KNEE ARTHROSCOPY WITH MEDIAL MENISECTOMY Left 02/18/2016   Procedure: KNEE ARTHROSCOPY WITH PARTIAL MEDIAL MENISECTOMY;  Surgeon: Hessie Knows, MD;  Location: [facility] ORS;  Service: Orthopedics;  Laterality: Left;  . TOTAL KNEE ARTHROPLASTY Right 08/10/2020   Procedure: RIGHT TOTAL KNEE ARTHROPLASTY;  Surgeon: Leandrew Koyanagi, MD;  Location: [place];  Service: Orthopedics;  Laterality: Right;  . TUBAL LIGATION  1984    There were no vitals filed for this visit.   Subjective Assessment - 08/31/20 1425    Subjective She did more walking this weekend.  Her  exercises are going okay.  Night is still bad. with pain pill sleeps 4-5 hrs then up for hours.    Pertinent History anxiety, arthritis, skin CA, fibromylagia, HTN,    Patient Stated Goals improve strength in both legs.  power walk with enough stamina.  improve balance.    Currently in Pain? Yes    Pain Score 3    over weekend, best 2/10 & worst 7/10   Pain Location Knee    Pain Orientation Right    Pain Descriptors / Indicators Aching;Tightness    Pain Type Surgical pain;Acute pain    Pain Onset 1 to 4 weeks ago    Pain Frequency Intermittent    Aggravating Factors  being on leg & moving too much    Pain Relieving Factors ice, pain meds                             [facility] Adult PT Treatment/Exercise - 08/31/20 1425      Self-Care   Self-Care Scar Mobilizations    Scar Mobilizations PT instructed pt in scar  mobilizations with verbal, demo & handout. pt return demo & verbalized understanding.      Exercises   Exercises Knee/Hip  Knee/Hip Exercises: Stretches   Active Hamstring Stretch Both;2 reps;20 seconds    Active Hamstring Stretch Limitations supine leg extended on leg press machine strap DF    Quad Stretch Right;2 reps;20 seconds    Quad Stretch Limitations supine leg over edge with strap to flex knee      Knee/Hip Exercises: Aerobic   Recumbent Bike sci Fit seat 7 with BLEs & BUEs for 8 mins initial 90sec partial revolutions.      Knee/Hip Exercises: Machines for Strengthening   Cybex Leg Press shuttle leg press BLEs 100# 15 reps 2 sets      Knee/Hip Exercises: Standing   Heel Raises Both;1 set;15 reps;5 seconds    Heel Raises Limitations alternating with toe raises 5 sec hold.  light UE support for balance.    Terminal Knee Extension Strengthening;Right;1 set;15 reps;Theraband    Theraband Level (Terminal Knee Extension) Level 3 (Green)    Terminal Knee Extension Limitations tactile cues for max knee ext      Knee/Hip Exercises: Supine   Heel  Slides Right;AROM;2 sets;10 reps    Heel Slides Limitations heel slide then lift knee above hip, flex knee, lower foot to bed, then slide out straight.    Straight Leg Raises --      Modalities   Modalities Vasopneumatic      Vasopneumatic   Number Minutes Vasopneumatic  10 minutes    Vasopnuematic Location  Knee   initial 5 min in TKE   Vasopneumatic Pressure Medium    Vasopneumatic Temperature  34      Manual Therapy   Manual Therapy Passive ROM    Passive ROM seated knee flexion with LLE LAQ 3x10                    PT Short Term Goals - 08/25/20 1650      PT SHORT TERM GOAL #1   Title Patient demonstrates understanding of HEP for first 4 weeks.    Time 4    Period Weeks    Status New    Target Date 09/18/20      PT SHORT TERM GOAL #2   Title right knee PROM 0* - 100*    Time 4    Period Weeks    Status New    Target Date 09/18/20      PT SHORT TERM GOAL #3   Title FOTO score improves to >50% functional by 10th visit.    Time 5    Period Weeks    Status New    Target Date --      PT SHORT TERM GOAL #4   Title --    Time --    Period --    Status --    Target Date --      PT SHORT TERM GOAL #5   Title --    Time --    Period --    Status --    Target Date --             PT Long Term Goals - 08/25/20 1653      PT LONG TERM GOAL #1   Title FOTO score 62% functional    Time 8    Period Weeks    Status New    Target Date 10/16/20      PT LONG TERM GOAL #2   Title right knee Pain </= 2/10 with activities.    Time 8    Period Weeks  Status New    Target Date 10/16/20      PT LONG TERM GOAL #3   Title right knee PROM 0* - 110*    Time 8    Period Weeks    Status New    Target Date 10/16/20      PT LONG TERM GOAL #4   Title AROM right knee extension seated -2* and flexion standing 85*    Time 8    Period Weeks    Status New    Target Date 10/16/20      PT LONG TERM GOAL #5   Title Patient ambulates >500' and negotiates  ramps & curbs without device independently.    Time 8    Period Weeks    Status New    Target Date 10/16/20                 Plan - 08/31/20 1428    Clinical Impression Statement PT session worked on increasing range in flexion & extension and functional strength. Patient is improving her motion.    Personal Factors and Comorbidities Comorbidity 3+    Comorbidities anxiety, arthritis, skin CA, fibromylagia, HTN    Examination-Activity Limitations Lift;Locomotion Level;Squat;Stairs;Stand;Transfers    Examination-Participation Restrictions Community Activity;Driving;Yard Work    Stability/Clinical Decision Making Stable/Uncomplicated    Rehab Potential Good    PT Frequency 2x / week    PT Duration 8 weeks    PT Treatment/Interventions ADLs/Self Care Home Management;Electrical Stimulation;Cryotherapy;DME Instruction;Gait training;Stair training;Functional mobility training;Therapeutic activities;Therapeutic exercise;Balance training;Neuromuscular re-education;Patient/family education;Manual techniques;Scar mobilization;Passive range of motion;Dry needling;Taping;Vasopneumatic Device;Vestibular;Joint Manipulations    PT Next Visit Plan check & update HEP PRN, manual therapy & exercise to increase range, vaso for edema    PT Home Exercise Plan Access Code: [email or web address]    Consulted and Agree with Plan of Care Patient           Patient will benefit from skilled therapeutic intervention in order to improve the following deficits and impairments:  Abnormal gait,Decreased activity tolerance,Decreased balance,Decreased mobility,Decreased range of motion,Decreased scar mobility,Decreased skin integrity,Decreased strength,Increased edema,Impaired flexibility,Pain  Visit Diagnosis: Acute pain of right knee  Stiffness of right knee, not elsewhere classified  Other abnormalities of gait and mobility  Unsteadiness on feet  Muscle weakness (generalized)  Localized edema     Problem  List Patient Active Problem List   Diagnosis Date Noted  . Status post total right knee replacement 08/10/2020  . Primary osteoarthritis of right knee 01/30/2020  . Chronic pain of left knee 09/11/2018  . Meniscus degeneration, left 09/11/2018  . Fibromyalgia 09/11/2018  . Chronic pain syndrome 09/11/2018  . Venous ulcer ([place]) 02/02/2017  . Varicose veins of bilateral lower extremities with pain 05/16/2016  . Chronic venous insufficiency 05/16/2016  . Pain in limb 05/16/2016  . Swelling of limb 05/16/2016  . DJD (degenerative joint disease) 05/16/2016    Jamey Reas, PT, DPT 08/31/2020, 3:23 PM  [facility] Physical Therapy [address] [place], [place], [address] Phone: [phone]   Fax:  [phone]  Name: OPRAH CAMARENA MRN: [id number] Date of Birth: Jul 28, 1955

## 2020-09-02 ENCOUNTER — Other Ambulatory Visit: Payer: Self-pay

## 2020-09-02 ENCOUNTER — Ambulatory Visit (INDEPENDENT_AMBULATORY_CARE_PROVIDER_SITE_OTHER): Payer: Medicare Other | Admitting: Physical Therapy

## 2020-09-02 DIAGNOSIS — R2689 Other abnormalities of gait and mobility: Secondary | ICD-10-CM

## 2020-09-02 DIAGNOSIS — R600 Localized edema: Secondary | ICD-10-CM

## 2020-09-02 DIAGNOSIS — M25661 Stiffness of right knee, not elsewhere classified: Secondary | ICD-10-CM | POA: Diagnosis not present

## 2020-09-02 DIAGNOSIS — R2681 Unsteadiness on feet: Secondary | ICD-10-CM

## 2020-09-02 DIAGNOSIS — M25561 Pain in right knee: Secondary | ICD-10-CM | POA: Diagnosis not present

## 2020-09-02 DIAGNOSIS — M6281 Muscle weakness (generalized): Secondary | ICD-10-CM

## 2020-09-02 NOTE — Therapy (Signed)
[place], [place], [address] Phone: [phone]   Fax:  [phone]  Physical Therapy Treatment  Patient Details  Name: Anna Mccarthy MRN: [id number] Date of Birth: 10/29/1955 Referring Provider (PT): Frankey Shown, MD   Encounter Date: 09/02/2020   PT End of Session - 09/02/20 1425    Visit Number 4    Number of Visits 16    Date for PT Re-Evaluation 10/16/20    Authorization Type Medicare A&B and AARP    Progress Note Due on Visit 10    PT Start Time 1345    PT Stop Time 1430    PT Time Calculation (min) 45 min    Activity Tolerance Patient tolerated treatment well    Behavior During Therapy [facility] for tasks assessed/performed           Past Medical History:  Diagnosis Date  . Allergy   . Anxiety   . Arthritis   . Bronchitis   . Cancer ([place])    skin  . Fibromyalgia   . GERD (gastroesophageal reflux disease)   . Hypertension   . Osteoarthritis     Past Surgical History:  Procedure Laterality Date  . ABDOMINAL HYSTERECTOMY  1996  . BREAST BIOPSY Left 2011   stereo done by dr. Bary Castilla -benign  . COLONOSCOPY    . COLONOSCOPY WITH PROPOFOL N/A 05/30/2016   Procedure: COLONOSCOPY WITH PROPOFOL;  Surgeon: Lollie Sails, MD;  Location[facility] ENDOSCOPY;  Service: Endoscopy;  Laterality: N/A;  . DILATION AND CURETTAGE OF UTERUS     x3  . HERNIA REPAIR  2012  . KNEE ARTHROSCOPY WITH MEDIAL MENISECTOMY Left 02/18/2016   Procedure: KNEE ARTHROSCOPY WITH PARTIAL MEDIAL MENISECTOMY;  Surgeon: Hessie Knows, MD;  Location: [facility] ORS;  Service: Orthopedics;  Laterality: Left;  . TOTAL KNEE ARTHROPLASTY Right 08/10/2020   Procedure: RIGHT TOTAL KNEE ARTHROPLASTY;  Surgeon: Leandrew Koyanagi, MD;  Location: [place];  Service: Orthopedics;  Laterality: Right;  . TUBAL LIGATION  1984    There were no vitals filed for this visit.   Subjective Assessment - 09/02/20 1359    Subjective She relays overall her knee is doing well,  not too much pain overall, stiffness is improving some.    Pertinent History anxiety, arthritis, skin CA, fibromylagia, HTN,    Patient Stated Goals improve strength in both legs.  power walk with enough stamina.  improve balance.    Pain Onset 1 to 4 weeks ago              [facility] PT Assessment - 09/02/20 0001      Assessment   Medical Diagnosis Right Knee Arthroplasty    Referring Provider (PT) Frankey Shown, MD    Onset Date/Surgical Date 08/10/20      AROM   Right Knee Flexion 110      PROM   Right Knee Flexion 120             [facility] Adult PT Treatment/Exercise - 09/02/20 0001      Knee/Hip Exercises: Stretches   Active Hamstring Stretch Both;2 reps;20 seconds    Active Hamstring Stretch Limitations seated    Quad Stretch Right;20 seconds;3 reps    Quad Stretch Limitations supine leg over edge with strap to flex knee    Knee: Self-Stretch Limitations 10 x 10 sec standing with Rt foot in chair      Knee/Hip Exercises: Aerobic   Recumbent Bike sci Fit seat 7 with BLEs &  BUEs L2 for 8 mins initial 60sec partial revolutions.      Knee/Hip Exercises: Machines for Strengthening   Cybex Leg Press shuttle leg press BLEs 106# 10 reps 3 sets      Knee/Hip Exercises: Standing   Heel Raises Both;1 set;15 reps;5 seconds    Heel Raises Limitations alternating with toe raises 5 sec hold.  light UE support for balance.    Functional Squat Limitations UE supported squats X 10      Knee/Hip Exercises: Seated   Other Seated Knee/Hip Exercises seated SLR 2x10 reps on Rt    Hamstring Curl Both;15 reps    Hamstring Limitations green    Sit to Sand 10 reps;without UE support      Vasopneumatic   Number Minutes Vasopneumatic  10 minutes    Vasopnuematic Location  Knee    Vasopneumatic Pressure Medium    Vasopneumatic Temperature  34      Manual Therapy   Passive ROM Rt knee PROM flexion and extension, mobs for flexion and extension                    PT Short Term  Goals - 09/02/20 1427      PT SHORT TERM GOAL #1   Title Patient demonstrates understanding of HEP for first 4 weeks.    Time 4    Period Weeks    Status On-going    Target Date 09/18/20      PT SHORT TERM GOAL #2   Title right knee PROM 0* - 100*    Baseline now has 120 flexion, missing 3 deg of extnesion    Time 4    Period Weeks    Status On-going    Target Date 09/18/20      PT SHORT TERM GOAL #3   Title FOTO score improves to >50% functional by 10th visit.    Time 5    Period Weeks    Status On-going             PT Long Term Goals - 08/25/20 1653      PT LONG TERM GOAL #1   Title FOTO score 62% functional    Time 8    Period Weeks    Status New    Target Date 10/16/20      PT LONG TERM GOAL #2   Title right knee Pain </= 2/10 with activities.    Time 8    Period Weeks    Status New    Target Date 10/16/20      PT LONG TERM GOAL #3   Title right knee PROM 0* - 110*    Time 8    Period Weeks    Status New    Target Date 10/16/20      PT LONG TERM GOAL #4   Title AROM right knee extension seated -2* and flexion standing 85*    Time 8    Period Weeks    Status New    Target Date 10/16/20      PT LONG TERM GOAL #5   Title Patient ambulates >500' and negotiates ramps & curbs without device independently.    Time 8    Period Weeks    Status New    Target Date 10/16/20                 Plan - 09/02/20 1425    Clinical Impression Statement She has now met her ROM goal for knee  flexion. She had good tolerance to strength progression will continue to benefit from further strengthening.    Personal Factors and Comorbidities Comorbidity 3+    Comorbidities anxiety, arthritis, skin CA, fibromylagia, HTN    Examination-Activity Limitations Lift;Locomotion Level;Squat;Stairs;Stand;Transfers    Examination-Participation Restrictions Community Activity;Driving;Yard Work    Stability/Clinical Decision Making Stable/Uncomplicated    Rehab Potential  Good    PT Frequency 2x / week    PT Duration 8 weeks    PT Treatment/Interventions ADLs/Self Care Home Management;Electrical Stimulation;Cryotherapy;DME Instruction;Gait training;Stair training;Functional mobility training;Therapeutic activities;Therapeutic exercise;Balance training;Neuromuscular re-education;Patient/family education;Manual techniques;Scar mobilization;Passive range of motion;Dry needling;Taping;Vasopneumatic Device;Vestibular;Joint Manipulations    PT Next Visit Plan check & update HEP PRN, progress functional stregth, manual therapy & exercise to increase range, vaso for edema    PT Home Exercise Plan Access Code: [email or web address]    Consulted and Agree with Plan of Care Patient           Patient will benefit from skilled therapeutic intervention in order to improve the following deficits and impairments:  Abnormal gait,Decreased activity tolerance,Decreased balance,Decreased mobility,Decreased range of motion,Decreased scar mobility,Decreased skin integrity,Decreased strength,Increased edema,Impaired flexibility,Pain  Visit Diagnosis: Acute pain of right knee  Stiffness of right knee, not elsewhere classified  Other abnormalities of gait and mobility  Unsteadiness on feet  Muscle weakness (generalized)  Localized edema     Problem List Patient Active Problem List   Diagnosis Date Noted  . Status post total right knee replacement 08/10/2020  . Primary osteoarthritis of right knee 01/30/2020  . Chronic pain of left knee 09/11/2018  . Meniscus degeneration, left 09/11/2018  . Fibromyalgia 09/11/2018  . Chronic pain syndrome 09/11/2018  . Venous ulcer ([place]) 02/02/2017  . Varicose veins of bilateral lower extremities with pain 05/16/2016  . Chronic venous insufficiency 05/16/2016  . Pain in limb 05/16/2016  . Swelling of limb 05/16/2016  . DJD (degenerative joint disease) 05/16/2016    Silvestre Mesi 09/02/2020, 2:28 PM  [facility] Physical  Therapy [address] [place], [place], [address] Phone: [phone]   Fax:  [phone]  Name: Anna Mccarthy MRN: [id number] Date of Birth: 09/04/1955

## 2020-09-03 ENCOUNTER — Encounter: Payer: Self-pay | Admitting: Orthopaedic Surgery

## 2020-09-03 ENCOUNTER — Encounter: Payer: Medicare Other | Admitting: Rehabilitative and Restorative Service Providers"

## 2020-09-04 ENCOUNTER — Other Ambulatory Visit: Payer: Self-pay | Admitting: Physician Assistant

## 2020-09-04 MED ORDER — AMOXICILLIN 500 MG PO CAPS
ORAL_CAPSULE | ORAL | 2 refills | Status: DC
Start: 2020-09-04 — End: 2020-11-17

## 2020-09-04 NOTE — Telephone Encounter (Signed)
Sent in

## 2020-09-07 ENCOUNTER — Encounter: Payer: Self-pay | Admitting: Physical Therapy

## 2020-09-07 ENCOUNTER — Ambulatory Visit (INDEPENDENT_AMBULATORY_CARE_PROVIDER_SITE_OTHER): Payer: Medicare Other | Admitting: Physical Therapy

## 2020-09-07 ENCOUNTER — Other Ambulatory Visit: Payer: Self-pay

## 2020-09-07 DIAGNOSIS — M25661 Stiffness of right knee, not elsewhere classified: Secondary | ICD-10-CM

## 2020-09-07 DIAGNOSIS — R2689 Other abnormalities of gait and mobility: Secondary | ICD-10-CM | POA: Diagnosis not present

## 2020-09-07 DIAGNOSIS — R2681 Unsteadiness on feet: Secondary | ICD-10-CM | POA: Diagnosis not present

## 2020-09-07 DIAGNOSIS — R600 Localized edema: Secondary | ICD-10-CM

## 2020-09-07 DIAGNOSIS — M6281 Muscle weakness (generalized): Secondary | ICD-10-CM

## 2020-09-07 DIAGNOSIS — M25561 Pain in right knee: Secondary | ICD-10-CM

## 2020-09-07 NOTE — Therapy (Signed)
[place], [place], [address] Phone: [phone]   Fax:  [phone]  Physical Therapy Treatment  Patient Details  Name: Anna Mccarthy MRN: [id number] Date of Birth: 04-05-1956 Referring Provider (PT): Frankey Shown, MD   Encounter Date: 09/07/2020   PT End of Session - 09/07/20 1455    Visit Number 5    Number of Visits 16    Date for PT Re-Evaluation 10/16/20    Authorization Type Medicare A&B and AARP    Progress Note Due on Visit 10    PT Start Time 1430    PT Stop Time 1528    PT Time Calculation (min) 58 min    Activity Tolerance Patient tolerated treatment well    Behavior During Therapy [facility] for tasks assessed/performed           Past Medical History:  Diagnosis Date  . Allergy   . Anxiety   . Arthritis   . Bronchitis   . Cancer ([place])    skin  . Fibromyalgia   . GERD (gastroesophageal reflux disease)   . Hypertension   . Osteoarthritis     Past Surgical History:  Procedure Laterality Date  . ABDOMINAL HYSTERECTOMY  1996  . BREAST BIOPSY Left 2011   stereo done by dr. Bary Castilla -benign  . COLONOSCOPY    . COLONOSCOPY WITH PROPOFOL N/A 05/30/2016   Procedure: COLONOSCOPY WITH PROPOFOL;  Surgeon: Lollie Sails, MD;  Location: [facility] ENDOSCOPY;  Service: Endoscopy;  Laterality: N/A;  . DILATION AND CURETTAGE OF UTERUS     x3  . HERNIA REPAIR  2012  . KNEE ARTHROSCOPY WITH MEDIAL MENISECTOMY Left 02/18/2016   Procedure: KNEE ARTHROSCOPY WITH PARTIAL MEDIAL MENISECTOMY;  Surgeon: Hessie Knows, MD;  Location: [facility] ORS;  Service: Orthopedics;  Laterality: Left;  . TOTAL KNEE ARTHROPLASTY Right 08/10/2020   Procedure: RIGHT TOTAL KNEE ARTHROPLASTY;  Surgeon: Leandrew Koyanagi, MD;  Location: [place];  Service: Orthopedics;  Laterality: Right;  . TUBAL LIGATION  1984    There were no vitals filed for this visit.   Subjective Assessment - 09/07/20 1430    Subjective The weather change late last week flared up  her Fibromylagia.    Pertinent History anxiety, arthritis, skin CA, fibromylagia, HTN,    Patient Stated Goals improve strength in both legs.  power walk with enough stamina.  improve balance.    Currently in Pain? Yes    Pain Score 2     Pain Location Knee    Pain Orientation Right    Pain Descriptors / Indicators Aching    Pain Type Surgical pain;Acute pain    Pain Onset 1 to 4 weeks ago    Pain Frequency Intermittent    Aggravating Factors  not moving so stiff    Pain Relieving Factors ice, meds                             [facility] Adult PT Treatment/Exercise - 09/07/20 1430      Ambulation/Gait   Stairs Yes    Stairs Assistance 5: Supervision    Stairs Assistance Details (indicate cue type and reason) PT demo & verbal cues on technique ascending & descending so body weight directly over stance limb. Carryover from step ups & step downs.    Stair Management Technique Two rails;One rail Right;One rail Left;Alternating pattern;Forwards   progressed from 2 rails, to contralateral, to ipsilateral   Number  of Stairs 11   2 reps   Height of Stairs 6      Self-Care   Self-Care Heat/Ice Application    Heat/Ice Application PT demo & verbal cues while performing ice massage to knee. Pt verbalized understanding & helped her knee pain.    Other Self-Care Comments  Pt owns [employer].  PT instructed in numbering knots and using owners manual for various muscles.  PT verbalized better understanding.      Knee/Hip Exercises: Aerobic   Recumbent Bike sci Fit seat 7 with BLEs & BUEs L2 for 8 mins.  Initial lifting right hip off seat for full motion but able to sit through motion after 1-2 minutes.      Knee/Hip Exercises: Standing   Heel Raises Both;1 set;15 reps;5 seconds    Heel Raises Limitations alternating with toe raises 5 sec hold.  light UE support for balance.    Knee Flexion Strengthening;Right;2 sets;10 reps   3# with BUE support for balance on wall   Knee Flexion  Limitations verbal cues on slow controlled motion    Forward Step Up Right;1 set;10 reps;Hand Hold: 2;Step Height: 6"    Forward Step Up Limitations PT demo & verbal cues on technique    Step Down Right;1 set;10 reps;Hand Hold: 2;Step Height: 6"    Step Down Limitations PT demo & verbal cues on technique    Functional Squat Limitations UE supported squats X 10      Knee/Hip Exercises: Seated   Long Arc Quad Strengthening;Right;2 sets;10 reps    Long Arc Quad Weight 3 lbs.    Long [employer] Limitations verbal cues on slow controlled motion    Sit to [employer] 10 reps;without UE support      Vasopneumatic   Number Minutes Vasopneumatic  10 minutes    Vasopnuematic Location  Knee    Vasopneumatic Pressure Medium    Vasopneumatic Temperature  34      Manual Therapy   Passive ROM Rt knee PROM flexion and extension, mobs for flexion and extension                    PT Short Term Goals - 09/02/20 1427      PT SHORT TERM GOAL #1   Title Patient demonstrates understanding of HEP for first 4 weeks.    Time 4    Period Weeks    Status On-going    Target Date 09/18/20      PT SHORT TERM GOAL #2   Title right knee PROM 0* - 100*    Baseline now has 120 flexion, missing 3 deg of extnesion    Time 4    Period Weeks    Status On-going    Target Date 09/18/20      PT SHORT TERM GOAL #3   Title FOTO score improves to >50% functional by 10th visit.    Time 5    Period Weeks    Status On-going             PT Long Term Goals - 08/25/20 1653      PT LONG TERM GOAL #1   Title FOTO score 62% functional    Time 8    Period Weeks    Status New    Target Date 10/16/20      PT LONG TERM GOAL #2   Title right knee Pain </= 2/10 with activities.    Time 8    Period Weeks  Status New    Target Date 10/16/20      PT LONG TERM GOAL #3   Title right knee PROM 0* - 110*    Time 8    Period Weeks    Status New    Target Date 10/16/20      PT LONG TERM GOAL #4   Title  AROM right knee extension seated -2* and flexion standing 85*    Time 8    Period Weeks    Status New    Target Date 10/16/20      PT LONG TERM GOAL #5   Title Patient ambulates >500' and negotiates ramps & curbs without device independently.    Time 8    Period Weeks    Status New    Target Date 10/16/20                 Plan - 09/07/20 1456    Clinical Impression Statement PT used step ups & step downs to build strength with cues on technique and progressed to stairs alternating pattern.  PT instructed in ice massage which she reports felt good on her knee.  Pt is improving mobility and function of knee    Personal Factors and Comorbidities Comorbidity 3+    Comorbidities anxiety, arthritis, skin CA, fibromylagia, HTN    Examination-Activity Limitations Lift;Locomotion Level;Squat;Stairs;Stand;Transfers    Examination-Participation Restrictions Community Activity;Driving;Yard Work    Stability/Clinical Decision Making Stable/Uncomplicated    Rehab Potential Good    PT Frequency 2x / week    PT Duration 8 weeks    PT Treatment/Interventions ADLs/Self Care Home Management;Electrical Stimulation;Cryotherapy;DME Instruction;Gait training;Stair training;Functional mobility training;Therapeutic activities;Therapeutic exercise;Balance training;Neuromuscular re-education;Patient/family education;Manual techniques;Scar mobilization;Passive range of motion;Dry needling;Taping;Vasopneumatic Device;Vestibular;Joint Manipulations    PT Next Visit Plan check & update HEP PRN, progress functional stregth, manual therapy & exercise to increase range, vaso for edema    PT Home Exercise Plan Access Code: [email or web address]    Consulted and Agree with Plan of Care Patient           Patient will benefit from skilled therapeutic intervention in order to improve the following deficits and impairments:  Abnormal gait,Decreased activity tolerance,Decreased balance,Decreased mobility,Decreased range of  motion,Decreased scar mobility,Decreased skin integrity,Decreased strength,Increased edema,Impaired flexibility,Pain  Visit Diagnosis: Acute pain of right knee  Stiffness of right knee, not elsewhere classified  Other abnormalities of gait and mobility  Unsteadiness on feet  Muscle weakness (generalized)  Localized edema     Problem List Patient Active Problem List   Diagnosis Date Noted  . Status post total right knee replacement 08/10/2020  . Primary osteoarthritis of right knee 01/30/2020  . Chronic pain of left knee 09/11/2018  . Meniscus degeneration, left 09/11/2018  . Fibromyalgia 09/11/2018  . Chronic pain syndrome 09/11/2018  . Venous ulcer ([place]) 02/02/2017  . Varicose veins of bilateral lower extremities with pain 05/16/2016  . Chronic venous insufficiency 05/16/2016  . Pain in limb 05/16/2016  . Swelling of limb 05/16/2016  . DJD (degenerative joint disease) 05/16/2016    Jamey Reas, PT, DPT 09/07/2020, 3:24 PM  [facility] Physical Therapy [address] [place], [place], [address] Phone: [phone]   Fax:  [phone]  Name: PORTIA WISDOM MRN: [id number] Date of Birth: [birth year]

## 2020-09-10 ENCOUNTER — Other Ambulatory Visit: Payer: Self-pay

## 2020-09-10 ENCOUNTER — Ambulatory Visit (INDEPENDENT_AMBULATORY_CARE_PROVIDER_SITE_OTHER): Payer: Medicare Other | Admitting: Physical Therapy

## 2020-09-10 ENCOUNTER — Encounter: Payer: Self-pay | Admitting: Physical Therapy

## 2020-09-10 DIAGNOSIS — R2689 Other abnormalities of gait and mobility: Secondary | ICD-10-CM | POA: Diagnosis not present

## 2020-09-10 DIAGNOSIS — M25561 Pain in right knee: Secondary | ICD-10-CM

## 2020-09-10 DIAGNOSIS — M25661 Stiffness of right knee, not elsewhere classified: Secondary | ICD-10-CM

## 2020-09-10 DIAGNOSIS — R2681 Unsteadiness on feet: Secondary | ICD-10-CM

## 2020-09-10 DIAGNOSIS — R600 Localized edema: Secondary | ICD-10-CM

## 2020-09-10 DIAGNOSIS — M6281 Muscle weakness (generalized): Secondary | ICD-10-CM

## 2020-09-10 NOTE — Therapy (Signed)
[place], [place], [address] Phone: [phone]   Fax:  [phone]  Physical Therapy Treatment  Patient Details  Name: Anna Mccarthy MRN: [id number] Date of Birth: 02-01-1956 Referring Provider (PT): Frankey Shown, MD   Encounter Date: 09/10/2020   PT End of Session - 09/10/20 1509    Visit Number 6    Number of Visits 16    Date for PT Re-Evaluation 10/16/20    Authorization Type Medicare A&B and AARP    Progress Note Due on Visit 10    PT Start Time 1429    PT Stop Time 1518    PT Time Calculation (min) 49 min    Activity Tolerance Patient tolerated treatment well    Behavior During Therapy [facility] for tasks assessed/performed           Past Medical History:  Diagnosis Date  . Allergy   . Anxiety   . Arthritis   . Bronchitis   . Cancer ([place])    skin  . Fibromyalgia   . GERD (gastroesophageal reflux disease)   . Hypertension   . Osteoarthritis     Past Surgical History:  Procedure Laterality Date  . ABDOMINAL HYSTERECTOMY  1996  . BREAST BIOPSY Left 2011   stereo done by dr. Bary Castilla -benign  . COLONOSCOPY    . COLONOSCOPY WITH PROPOFOL N/A 05/30/2016   Procedure: COLONOSCOPY WITH PROPOFOL;  Surgeon: Lollie Sails, MD;  Location: [facility] ENDOSCOPY;  Service: Endoscopy;  Laterality: N/A;  . DILATION AND CURETTAGE OF UTERUS     x3  . HERNIA REPAIR  2012  . KNEE ARTHROSCOPY WITH MEDIAL MENISECTOMY Left 02/18/2016   Procedure: KNEE ARTHROSCOPY WITH PARTIAL MEDIAL MENISECTOMY;  Surgeon: Hessie Knows, MD;  Location: [facility] ORS;  Service: Orthopedics;  Laterality: Left;  . TOTAL KNEE ARTHROPLASTY Right 08/10/2020   Procedure: RIGHT TOTAL KNEE ARTHROPLASTY;  Surgeon: Leandrew Koyanagi, MD;  Location: [place];  Service: Orthopedics;  Laterality: Right;  . TUBAL LIGATION  1984    There were no vitals filed for this visit.   Subjective Assessment - 09/10/20 1433    Subjective doing well, knees were stiff this morning     Pertinent History anxiety, arthritis, skin CA, fibromylagia, HTN,    Patient Stated Goals improve strength in both legs.  power walk with enough stamina.  improve balance.    Currently in Pain? No/denies              [facility] PT Assessment - 09/10/20 1446      Assessment   Medical Diagnosis Right Knee Arthroplasty    Referring Provider (PT) Frankey Shown, MD      AROM   Right Knee Flexion 127                         [facility] Adult PT Treatment/Exercise - 09/10/20 1434      Knee/Hip Exercises: Stretches   Passive Hamstring Stretch Right;3 reps;30 seconds    Passive Hamstring Stretch Limitations supine with strap; overpressure at distal thigh      Knee/Hip Exercises: Aerobic   Recumbent Bike seat 4 x 8 min; full revolutions      Knee/Hip Exercises: Machines for Strengthening   Cybex Knee Extension 5# focus on RLE, min A from LLE 2x10 reps    Cybex Knee Flexion 15# focus on RLE, min A from LLE 2x10 reps    Cybex Leg Press shuttle leg press BLEs 106#  10 reps 3 sets      Knee/Hip Exercises: Standing   Other Standing Knee Exercises RDL 3x10 10# KB      Vasopneumatic   Number Minutes Vasopneumatic  10 minutes    Vasopnuematic Location  Knee    Vasopneumatic Pressure Medium    Vasopneumatic Temperature  34                    PT Short Term Goals - 09/02/20 1427      PT SHORT TERM GOAL #1   Title Patient demonstrates understanding of HEP for first 4 weeks.    Time 4    Period Weeks    Status On-going    Target Date 09/18/20      PT SHORT TERM GOAL #2   Title right knee PROM 0* - 100*    Baseline now has 120 flexion, missing 3 deg of extnesion    Time 4    Period Weeks    Status On-going    Target Date 09/18/20      PT SHORT TERM GOAL #3   Title FOTO score improves to >50% functional by 10th visit.    Time 5    Period Weeks    Status On-going             PT Long Term Goals - 08/25/20 1653      PT LONG TERM GOAL #1   Title FOTO score  62% functional    Time 8    Period Weeks    Status New    Target Date 10/16/20      PT LONG TERM GOAL #2   Title right knee Pain </= 2/10 with activities.    Time 8    Period Weeks    Status New    Target Date 10/16/20      PT LONG TERM GOAL #3   Title right knee PROM 0* - 110*    Time 8    Period Weeks    Status New    Target Date 10/16/20      PT LONG TERM GOAL #4   Title AROM right knee extension seated -2* and flexion standing 85*    Time 8    Period Weeks    Status New    Target Date 10/16/20      PT LONG TERM GOAL #5   Title Patient ambulates >500' and negotiates ramps & curbs without device independently.    Time 8    Period Weeks    Status New    Target Date 10/16/20                 Plan - 09/10/20 1509    Clinical Impression Statement Continues to demonstrate great improvements in AROM and overall progressing well with PT.  Will cotninue to benefit from PT to maximize function.    Personal Factors and Comorbidities Comorbidity 3+    Comorbidities anxiety, arthritis, skin CA, fibromylagia, HTN    Examination-Activity Limitations Lift;Locomotion Level;Squat;Stairs;Stand;Transfers    Examination-Participation Restrictions Community Activity;Driving;Yard Work    Stability/Clinical Decision Making Stable/Uncomplicated    Rehab Potential Good    PT Frequency 2x / week    PT Duration 8 weeks    PT Treatment/Interventions ADLs/Self Care Home Management;Electrical Stimulation;Cryotherapy;DME Instruction;Gait training;Stair training;Functional mobility training;Therapeutic activities;Therapeutic exercise;Balance training;Neuromuscular re-education;Patient/family education;Manual techniques;Scar mobilization;Passive range of motion;Dry needling;Taping;Vasopneumatic Device;Vestibular;Joint Manipulations    PT Next Visit Plan progress functional strength, manual therapy & exercise to increase range, vaso  for edema    PT Home Exercise Plan Access Code: [email or web address]     Consulted and Agree with Plan of Care Patient           Patient will benefit from skilled therapeutic intervention in order to improve the following deficits and impairments:  Abnormal gait,Decreased activity tolerance,Decreased balance,Decreased mobility,Decreased range of motion,Decreased scar mobility,Decreased skin integrity,Decreased strength,Increased edema,Impaired flexibility,Pain  Visit Diagnosis: Acute pain of right knee  Stiffness of right knee, not elsewhere classified  Other abnormalities of gait and mobility  Unsteadiness on feet  Muscle weakness (generalized)  Localized edema     Problem List Patient Active Problem List   Diagnosis Date Noted  . Status post total right knee replacement 08/10/2020  . Primary osteoarthritis of right knee 01/30/2020  . Chronic pain of left knee 09/11/2018  . Meniscus degeneration, left 09/11/2018  . Fibromyalgia 09/11/2018  . Chronic pain syndrome 09/11/2018  . Venous ulcer ([place]) 02/02/2017  . Varicose veins of bilateral lower extremities with pain 05/16/2016  . Chronic venous insufficiency 05/16/2016  . Pain in limb 05/16/2016  . Swelling of limb 05/16/2016  . DJD (degenerative joint disease) 05/16/2016      Laureen Abrahams, PT, DPT 09/10/20 3:12 PM    [facility] [address] [place], [place], [address] Phone: [phone]   Fax:  [phone]  Name: Anna Mccarthy MRN: [id number] Date of Birth: Oct 26, 1955

## 2020-09-14 ENCOUNTER — Other Ambulatory Visit: Payer: Self-pay

## 2020-09-14 ENCOUNTER — Encounter: Payer: Self-pay | Admitting: Physical Therapy

## 2020-09-14 ENCOUNTER — Ambulatory Visit (INDEPENDENT_AMBULATORY_CARE_PROVIDER_SITE_OTHER): Payer: Medicare Other | Admitting: Physical Therapy

## 2020-09-14 DIAGNOSIS — M25561 Pain in right knee: Secondary | ICD-10-CM | POA: Diagnosis not present

## 2020-09-14 DIAGNOSIS — R2681 Unsteadiness on feet: Secondary | ICD-10-CM | POA: Diagnosis not present

## 2020-09-14 DIAGNOSIS — M25661 Stiffness of right knee, not elsewhere classified: Secondary | ICD-10-CM

## 2020-09-14 DIAGNOSIS — M6281 Muscle weakness (generalized): Secondary | ICD-10-CM

## 2020-09-14 DIAGNOSIS — R2689 Other abnormalities of gait and mobility: Secondary | ICD-10-CM

## 2020-09-14 DIAGNOSIS — R600 Localized edema: Secondary | ICD-10-CM

## 2020-09-14 NOTE — Therapy (Signed)
[place], [place], [address] Phone: [phone]   Fax:  [phone]  Physical Therapy Treatment  Patient Details  Name: Anna Mccarthy MRN: [id number] Date of Birth: September 28, 1955 Referring Provider (PT): Frankey Shown, MD   Encounter Date: 09/14/2020   PT End of Session - 09/14/20 1504    Visit Number 7    Number of Visits 16    Date for PT Re-Evaluation 10/16/20    Authorization Type Medicare A&B and AARP    Progress Note Due on Visit 10    PT Start Time 1425    PT Stop Time 1513    PT Time Calculation (min) 48 min    Activity Tolerance Patient tolerated treatment well    Behavior During Therapy [facility] for tasks assessed/performed           Past Medical History:  Diagnosis Date  . Allergy   . Anxiety   . Arthritis   . Bronchitis   . Cancer ([place])    skin  . Fibromyalgia   . GERD (gastroesophageal reflux disease)   . Hypertension   . Osteoarthritis     Past Surgical History:  Procedure Laterality Date  . ABDOMINAL HYSTERECTOMY  1996  . BREAST BIOPSY Left 2011   stereo done by dr. Bary Castilla -benign  . COLONOSCOPY    . COLONOSCOPY WITH PROPOFOL N/A 05/30/2016   Procedure: COLONOSCOPY WITH PROPOFOL;  Surgeon: Lollie Sails, MD;  Location: [facility] ENDOSCOPY;  Service: Endoscopy;  Laterality: N/A;  . DILATION AND CURETTAGE OF UTERUS     x3  . HERNIA REPAIR  2012  . KNEE ARTHROSCOPY WITH MEDIAL MENISECTOMY Left 02/18/2016   Procedure: KNEE ARTHROSCOPY WITH PARTIAL MEDIAL MENISECTOMY;  Surgeon: Hessie Knows, MD;  Location: [facility] ORS;  Service: Orthopedics;  Laterality: Left;  . TOTAL KNEE ARTHROPLASTY Right 08/10/2020   Procedure: RIGHT TOTAL KNEE ARTHROPLASTY;  Surgeon: Leandrew Koyanagi, MD;  Location: [facility];  Service: Orthopedics;  Laterality: Right;  . TUBAL LIGATION  1984    There were no vitals filed for this visit.   Subjective Assessment - 09/14/20 1427    Subjective "I'm off today." ate a little more  unhealthy yesterday and feels "off."    Pertinent History anxiety, arthritis, skin CA, fibromylagia, HTN,    Patient Stated Goals improve strength in both legs.  power walk with enough stamina.  improve balance.    Currently in Pain? No/denies                             [facility] Adult PT Treatment/Exercise - 09/14/20 1428      Knee/Hip Exercises: Stretches   Passive Hamstring Stretch Right;30 seconds;5 reps    Passive Hamstring Stretch Limitations supine with strap; overpressure at distal thigh      Knee/Hip Exercises: Aerobic   Recumbent Bike L3 x 8 min; seat 4      Knee/Hip Exercises: Machines for Strengthening   Cybex Leg Press shuttle leg press BLEs 106# 10 reps 3 sets; RLE 50# 3x10      Knee/Hip Exercises: Standing   Terminal Knee Extension Strengthening;Right;20 reps;Theraband    Theraband Level (Terminal Knee Extension) Level 4 (Blue)    Functional Squat 3 sets;10 reps;3 seconds   TRX   Other Standing Knee Exercises RDL 3x10 15# KB      Vasopneumatic   Number Minutes Vasopneumatic  10 minutes    Vasopnuematic Location  Knee  Vasopneumatic Pressure High    Vasopneumatic Temperature  34      Manual Therapy   Passive ROM Rt knee into extension                    PT Short Term Goals - 09/02/20 1427      PT SHORT TERM GOAL #1   Title Patient demonstrates understanding of HEP for first 4 weeks.    Time 4    Period Weeks    Status On-going    Target Date 09/18/20      PT SHORT TERM GOAL #2   Title right knee PROM 0* - 100*    Baseline now has 120 flexion, missing 3 deg of extnesion    Time 4    Period Weeks    Status On-going    Target Date 09/18/20      PT SHORT TERM GOAL #3   Title FOTO score improves to >50% functional by 10th visit.    Time 5    Period Weeks    Status On-going             PT Long Term Goals - 08/25/20 1653      PT LONG TERM GOAL #1   Title FOTO score 62% functional    Time 8    Period Weeks     Status New    Target Date 10/16/20      PT LONG TERM GOAL #2   Title right knee Pain </= 2/10 with activities.    Time 8    Period Weeks    Status New    Target Date 10/16/20      PT LONG TERM GOAL #3   Title right knee PROM 0* - 110*    Time 8    Period Weeks    Status New    Target Date 10/16/20      PT LONG TERM GOAL #4   Title AROM right knee extension seated -2* and flexion standing 85*    Time 8    Period Weeks    Status New    Target Date 10/16/20      PT LONG TERM GOAL #5   Title Patient ambulates >500' and negotiates ramps & curbs without device independently.    Time 8    Period Weeks    Status New    Target Date 10/16/20                 Plan - 09/14/20 1504    Clinical Impression Statement Pt tolerated strengthening well today and improved extension noted today with stretching and manual therapy.  Overall progressing well with PT.    Personal Factors and Comorbidities Comorbidity 3+    Comorbidities anxiety, arthritis, skin CA, fibromylagia, HTN    Examination-Activity Limitations Lift;Locomotion Level;Squat;Stairs;Stand;Transfers    Examination-Participation Restrictions Community Activity;Driving;Yard Work    Stability/Clinical Decision Making Stable/Uncomplicated    Rehab Potential Good    PT Frequency 2x / week    PT Duration 8 weeks    PT Treatment/Interventions ADLs/Self Care Home Management;Electrical Stimulation;Cryotherapy;DME Instruction;Gait training;Stair training;Functional mobility training;Therapeutic activities;Therapeutic exercise;Balance training;Neuromuscular re-education;Patient/family education;Manual techniques;Scar mobilization;Passive range of motion;Dry needling;Taping;Vasopneumatic Device;Vestibular;Joint Manipulations    PT Next Visit Plan progress functional strength, manual therapy & exercise to increase range, vaso for edema; measure, check STGs    PT Home Exercise Plan Access Code: [email or web address]    Consulted and Agree with Plan  of Care Patient  Patient will benefit from skilled therapeutic intervention in order to improve the following deficits and impairments:  Abnormal gait,Decreased activity tolerance,Decreased balance,Decreased mobility,Decreased range of motion,Decreased scar mobility,Decreased skin integrity,Decreased strength,Increased edema,Impaired flexibility,Pain  Visit Diagnosis: Acute pain of right knee  Stiffness of right knee, not elsewhere classified  Other abnormalities of gait and mobility  Unsteadiness on feet  Muscle weakness (generalized)  Localized edema     Problem List Patient Active Problem List   Diagnosis Date Noted  . Status post total right knee replacement 08/10/2020  . Primary osteoarthritis of right knee 01/30/2020  . Chronic pain of left knee 09/11/2018  . Meniscus degeneration, left 09/11/2018  . Fibromyalgia 09/11/2018  . Chronic pain syndrome 09/11/2018  . Venous ulcer ([place]) 02/02/2017  . Varicose veins of bilateral lower extremities with pain 05/16/2016  . Chronic venous insufficiency 05/16/2016  . Pain in limb 05/16/2016  . Swelling of limb 05/16/2016  . DJD (degenerative joint disease) 05/16/2016      Laureen Abrahams, PT, DPT 09/14/20 3:06 PM    [facility] [address] [place], [place], [address] Phone: [phone]   Fax:  [phone]  Name: AFFIE GASNER MRN: [id number] Date of Birth: [birth year]

## 2020-09-17 ENCOUNTER — Other Ambulatory Visit: Payer: Self-pay

## 2020-09-17 ENCOUNTER — Encounter: Payer: Self-pay | Admitting: Physical Therapy

## 2020-09-17 ENCOUNTER — Ambulatory Visit (INDEPENDENT_AMBULATORY_CARE_PROVIDER_SITE_OTHER): Payer: Medicare Other | Admitting: Physical Therapy

## 2020-09-17 DIAGNOSIS — R600 Localized edema: Secondary | ICD-10-CM

## 2020-09-17 DIAGNOSIS — M6281 Muscle weakness (generalized): Secondary | ICD-10-CM

## 2020-09-17 DIAGNOSIS — M25661 Stiffness of right knee, not elsewhere classified: Secondary | ICD-10-CM

## 2020-09-17 DIAGNOSIS — R2689 Other abnormalities of gait and mobility: Secondary | ICD-10-CM

## 2020-09-17 DIAGNOSIS — R2681 Unsteadiness on feet: Secondary | ICD-10-CM

## 2020-09-17 DIAGNOSIS — M25561 Pain in right knee: Secondary | ICD-10-CM

## 2020-09-17 NOTE — Therapy (Signed)
[facility] [place], [place], [address] Phone: [phone]   Fax:  [phone]  Physical Therapy Treatment  Patient Details  Name: Anna Mccarthy MRN: [id number] Date of Birth: 1955/10/15 Referring Provider (PT): Frankey Shown, MD   Encounter Date: 09/17/2020   PT End of Session - 09/17/20 1507    Visit Number 8    Number of Visits 16    Date for PT Re-Evaluation 10/16/20    Authorization Type Medicare A&B and AARP    Progress Note Due on Visit 10    PT Start Time 1427    PT Stop Time 1517    PT Time Calculation (min) 50 min    Activity Tolerance Patient tolerated treatment well    Behavior During Therapy [facility] for tasks assessed/performed           Past Medical History:  Diagnosis Date  . Allergy   . Anxiety   . Arthritis   . Bronchitis   . Cancer ([place])    skin  . Fibromyalgia   . GERD (gastroesophageal reflux disease)   . Hypertension   . Osteoarthritis     Past Surgical History:  Procedure Laterality Date  . ABDOMINAL HYSTERECTOMY  1996  . BREAST BIOPSY Left 2011   stereo done by dr. Bary Castilla -benign  . COLONOSCOPY    . COLONOSCOPY WITH PROPOFOL N/A 05/30/2016   Procedure: COLONOSCOPY WITH PROPOFOL;  Surgeon: Lollie Sails, MD;  Location: [facility] ENDOSCOPY;  Service: Endoscopy;  Laterality: N/A;  . DILATION AND CURETTAGE OF UTERUS     x3  . HERNIA REPAIR  2012  . KNEE ARTHROSCOPY WITH MEDIAL MENISECTOMY Left 02/18/2016   Procedure: KNEE ARTHROSCOPY WITH PARTIAL MEDIAL MENISECTOMY;  Surgeon: Hessie Knows, MD;  Location: [facility] ORS;  Service: Orthopedics;  Laterality: Left;  . TOTAL KNEE ARTHROPLASTY Right 08/10/2020   Procedure: RIGHT TOTAL KNEE ARTHROPLASTY;  Surgeon: Leandrew Koyanagi, MD;  Location: [place];  Service: Orthopedics;  Laterality: Right;  . TUBAL LIGATION  1984    There were no vitals filed for this visit.   Subjective Assessment - 09/17/20 1429    Subjective doing well- working on extension and  stretching    Pertinent History anxiety, arthritis, skin CA, fibromylagia, HTN,    Patient Stated Goals improve strength in both legs.  power walk with enough stamina.  improve balance.    Currently in Pain? No/denies              [facility] PT Assessment - 09/17/20 1511      Assessment   Medical Diagnosis Right Knee Arthroplasty    Referring Provider (PT) Frankey Shown, MD      Observation/Other Assessments   Focus on Therapeutic Outcomes (FOTO)  80                         [facility] PT Treatment/Exercise - 09/17/20 1431      Knee/Hip Exercises: Aerobic   Recumbent Bike L3 x 8 min; seat 4      Knee/Hip Exercises: Machines for Strengthening   Cybex Leg Press shuttle leg press BLEs 112# 10 reps 3 sets; RLE 56# 3x10      Knee/Hip Exercises: Standing   Lateral Step Up Both;10 reps;Hand Hold: 0;Step Height: 6"    Forward Step Up Both;10 reps;Hand Hold: 0;Step Height: 6"    Functional Squat 3 sets;10 reps;3 seconds   TRX   Other Standing Knee Exercises SL deadlift 2x10  bil; 5# KB      Vasopneumatic   Number Minutes Vasopneumatic  10 minutes    Vasopnuematic Location  Knee    Vasopneumatic Pressure High    Vasopneumatic Temperature  34                    PT Short Term Goals - 09/17/20 1515      PT SHORT TERM GOAL #1   Title Patient demonstrates understanding of HEP for first 4 weeks.    Time 4    Period Weeks    Status Achieved    Target Date 09/18/20      PT SHORT TERM GOAL #2   Title right knee PROM 0* - 100*    Time 4    Period Weeks    Status On-going    Target Date 09/18/20      PT SHORT TERM GOAL #3   Title FOTO score improves to >50% functional by 10th visit.    Time 5    Period Weeks    Status Achieved             PT Long Term Goals - 09/17/20 1516      PT LONG TERM GOAL #1   Title FOTO score 62% functional    Time 8    Period Weeks    Status Achieved      PT LONG TERM GOAL #2   Title right knee Pain </= 2/10 with  activities.    Time 8    Period Weeks    Status On-going    Target Date 10/16/20      PT LONG TERM GOAL #3   Title right knee PROM 0* - 110*    Time 8    Period Weeks    Status On-going    Target Date 10/16/20      PT LONG TERM GOAL #4   Title AROM right knee extension seated -2* and flexion standing 85*    Time 8    Period Weeks    Status On-going    Target Date 10/16/20      PT LONG TERM GOAL #5   Title Patient ambulates >500' and negotiates ramps & curbs without device independently.    Time 8    Period Weeks    Status On-going    Target Date 10/16/20                 Plan - 09/17/20 1517    Clinical Impression Statement Pt has met FOTO goal today and has demonstrated great progress with PT.  She sees the MD next week so will plan for progress note at next visit and determine whether additional PT is needed.    Personal Factors and Comorbidities Comorbidity 3+    Comorbidities anxiety, arthritis, skin CA, fibromylagia, HTN    Examination-Activity Limitations Lift;Locomotion Level;Squat;Stairs;Stand;Transfers    Examination-Participation Restrictions Community Activity;Driving;Yard Work    Stability/Clinical Decision Making Stable/Uncomplicated    Rehab Potential Good    PT Frequency 2x / week    PT Duration 8 weeks    PT Treatment/Interventions ADLs/Self Care Home Management;Electrical Stimulation;Cryotherapy;DME Instruction;Gait training;Stair training;Functional mobility training;Therapeutic activities;Therapeutic exercise;Balance training;Neuromuscular re-education;Patient/family education;Manual techniques;Scar mobilization;Passive range of motion;Dry needling;Taping;Vasopneumatic Device;Vestibular;Joint Manipulations    PT Next Visit Plan look at goals, measure/MD note, ?ready for d/c    PT Home Exercise Plan Access Code: [email or web address]    Consulted and Agree with Plan of Care Patient  Patient will benefit from skilled therapeutic intervention in order  to improve the following deficits and impairments:  Abnormal gait,Decreased activity tolerance,Decreased balance,Decreased mobility,Decreased range of motion,Decreased scar mobility,Decreased skin integrity,Decreased strength,Increased edema,Impaired flexibility,Pain  Visit Diagnosis: Acute pain of right knee  Stiffness of right knee, not elsewhere classified  Other abnormalities of gait and mobility  Unsteadiness on feet  Muscle weakness (generalized)  Localized edema     Problem List Patient Active Problem List   Diagnosis Date Noted  . Status post total right knee replacement 08/10/2020  . Primary osteoarthritis of right knee 01/30/2020  . Chronic pain of left knee 09/11/2018  . Meniscus degeneration, left 09/11/2018  . Fibromyalgia 09/11/2018  . Chronic pain syndrome 09/11/2018  . Venous ulcer ([place]) 02/02/2017  . Varicose veins of bilateral lower extremities with pain 05/16/2016  . Chronic venous insufficiency 05/16/2016  . Pain in limb 05/16/2016  . Swelling of limb 05/16/2016  . DJD (degenerative joint disease) 05/16/2016      Laureen Abrahams, PT, DPT 09/17/20 3:22 PM    [facility] [address] [place], [place], [address] Phone: [phone]   Fax:  [phone]  Name: DELINDA MALAN MRN: [id number] Date of Birth: 04-03-1956

## 2020-09-21 ENCOUNTER — Encounter: Payer: Self-pay | Admitting: Physical Therapy

## 2020-09-21 ENCOUNTER — Other Ambulatory Visit: Payer: Self-pay

## 2020-09-21 ENCOUNTER — Ambulatory Visit (INDEPENDENT_AMBULATORY_CARE_PROVIDER_SITE_OTHER): Payer: Medicare Other | Admitting: Physical Therapy

## 2020-09-21 DIAGNOSIS — M25661 Stiffness of right knee, not elsewhere classified: Secondary | ICD-10-CM | POA: Diagnosis not present

## 2020-09-21 DIAGNOSIS — M25561 Pain in right knee: Secondary | ICD-10-CM

## 2020-09-21 DIAGNOSIS — R2689 Other abnormalities of gait and mobility: Secondary | ICD-10-CM | POA: Diagnosis not present

## 2020-09-21 DIAGNOSIS — M6281 Muscle weakness (generalized): Secondary | ICD-10-CM

## 2020-09-21 DIAGNOSIS — R2681 Unsteadiness on feet: Secondary | ICD-10-CM

## 2020-09-21 DIAGNOSIS — R600 Localized edema: Secondary | ICD-10-CM

## 2020-09-21 NOTE — Therapy (Addendum)
[facility] [address] [place], [place], [address] Phone: [phone]   Fax:  [phone]  Physical Therapy Treatment/Progress Note/Discharge Summary Progress Note Reporting Period 08/25/20 to 09/21/20  See note below for Objective Data and Assessment of Progress/Goals.       Patient Details  Name: Anna Mccarthy MRN: [id number] Date of Birth: 1955-07-29 Referring Provider (PT): Frankey Shown, MD   Encounter Date: 09/21/2020   PT End of Session - 09/21/20 1502    Visit Number 9    Number of Visits 16    Date for PT Re-Evaluation 10/16/20    Authorization Type Medicare A&B and AARP    Progress Note Due on Visit 19    PT Start Time 1430    PT Stop Time 1520    PT Time Calculation (min) 50 min    Activity Tolerance Patient tolerated treatment well    Behavior During Therapy WFL for tasks assessed/performed           Past Medical History:  Diagnosis Date  . Allergy   . Anxiety   . Arthritis   . Bronchitis   . Cancer ([place])    skin  . Fibromyalgia   . GERD (gastroesophageal reflux disease)   . Hypertension   . Osteoarthritis     Past Surgical History:  Procedure Laterality Date  . ABDOMINAL HYSTERECTOMY  1996  . BREAST BIOPSY Left 2011   stereo done by dr. Bary Castilla -benign  . COLONOSCOPY    . COLONOSCOPY WITH PROPOFOL N/A 05/30/2016   Procedure: COLONOSCOPY WITH PROPOFOL;  Surgeon: Lollie Sails, MD;  Location: [facility] ENDOSCOPY;  Service: Endoscopy;  Laterality: N/A;  . DILATION AND CURETTAGE OF UTERUS     x3  . HERNIA REPAIR  2012  . KNEE ARTHROSCOPY WITH MEDIAL MENISECTOMY Left 02/18/2016   Procedure: KNEE ARTHROSCOPY WITH PARTIAL MEDIAL MENISECTOMY;  Surgeon: Hessie Knows, MD;  Location: [facility] ORS;  Service: Orthopedics;  Laterality: Left;  . TOTAL KNEE ARTHROPLASTY Right 08/10/2020   Procedure: RIGHT TOTAL KNEE ARTHROPLASTY;  Surgeon: Leandrew Koyanagi, MD;  Location: [facility];  Service: Orthopedics;  Laterality: Right;  . TUBAL  LIGATION  1984    There were no vitals filed for this visit.   Subjective Assessment - 09/21/20 1427    Subjective knee is doing well, no complaints.  sees MD tomorrow    Pertinent History anxiety, arthritis, skin CA, fibromylagia, HTN,    Patient Stated Goals improve strength in both legs.  power walk with enough stamina.  improve balance.    Currently in Pain? No/denies              [facility] PT Assessment - 09/21/20 1439      Assessment   Medical Diagnosis Right Knee Arthroplasty    Referring Provider (PT) Frankey Shown, MD    Onset Date/Surgical Date 08/10/20      Observation/Other Assessments   Focus on Therapeutic Outcomes (FOTO)  69      AROM   Right Knee Extension 0   sitting   Right Knee Flexion 127   107 standing                        [facility] Adult PT Treatment/Exercise - 09/21/20 1432      Knee/Hip Exercises: Aerobic   Recumbent Bike L4 x 8 min; seat 4      Knee/Hip Exercises: Machines for Strengthening   Cybex Leg Press shuttle leg press BLEs 112#  10 reps 3 sets; single leg performed bil 56# 3x10      Knee/Hip Exercises: Standing   Lateral Step Up 2 sets;10 reps;Hand Hold: 1;Step Height: 6";Right    Forward Step Up Right;2 sets;10 reps;Hand Hold: 0;Step Height: 6"    SLS 3 way lunge on slider x 10 reps bil                    PT Short Term Goals - 09/21/20 1502      PT SHORT TERM GOAL #1   Title Patient demonstrates understanding of HEP for first 4 weeks.    Time 4    Period Weeks    Status Achieved    Target Date 09/18/20      PT SHORT TERM GOAL #2   Title right knee PROM 0* - 100*    Time 4    Period Weeks    Status Achieved    Target Date 09/18/20      PT SHORT TERM GOAL #3   Title FOTO score improves to >50% functional by 10th visit.    Time 5    Period Weeks    Status Achieved             PT Long Term Goals - 09/21/20 1502      PT LONG TERM GOAL #1   Title FOTO score 62% functional    Time 8    Period Weeks     Status Achieved      PT LONG TERM GOAL #2   Title right knee Pain </= 2/10 with activities.    Time 8    Period Weeks    Status Achieved      PT LONG TERM GOAL #3   Title right knee PROM 0* - 110*    Time 8    Period Weeks    Status Achieved      PT LONG TERM GOAL #4   Title AROM right knee extension seated -2* and flexion standing 85*    Time 8    Period Weeks    Status Achieved      PT LONG TERM GOAL #5   Title Patient ambulates >500' and negotiates ramps & curbs without device independently.    Time 8    Period Weeks    Status Achieved                 Plan - 09/21/20 1502    Clinical Impression Statement Pt has met all LTGs at this time and anticipate she will be ready for d/c from PT.  She's going to see the MD tomorrow so will await final recommendations from MD.    Personal Factors and Comorbidities Comorbidity 3+    Comorbidities anxiety, arthritis, skin CA, fibromylagia, HTN    Examination-Activity Limitations Lift;Locomotion Level;Squat;Stairs;Stand;Transfers    Examination-Participation Restrictions Community Activity;Driving;Yard Work    Stability/Clinical Decision Making Stable/Uncomplicated    Rehab Potential Good    PT Frequency 2x / week    PT Duration 8 weeks    PT Treatment/Interventions ADLs/Self Care Home Management;Electrical Stimulation;Cryotherapy;DME Instruction;Gait training;Stair training;Functional mobility training;Therapeutic activities;Therapeutic exercise;Balance training;Neuromuscular re-education;Patient/family education;Manual techniques;Scar mobilization;Passive range of motion;Dry needling;Taping;Vasopneumatic Device;Vestibular;Joint Manipulations    PT Next Visit Plan see what MD says, will need recert if she needs to return    PT Home Exercise Plan Access Code: [email or web address]    Consulted and Agree with Plan of Care Patient           Patient  will benefit from skilled therapeutic intervention in order to improve the following  deficits and impairments:  Abnormal gait,Decreased activity tolerance,Decreased balance,Decreased mobility,Decreased range of motion,Decreased scar mobility,Decreased skin integrity,Decreased strength,Increased edema,Impaired flexibility,Pain  Visit Diagnosis: Acute pain of right knee  Stiffness of right knee, not elsewhere classified  Other abnormalities of gait and mobility  Unsteadiness on feet  Muscle weakness (generalized)  Localized edema     Problem List Patient Active Problem List   Diagnosis Date Noted  . Status post total right knee replacement 08/10/2020  . Primary osteoarthritis of right knee 01/30/2020  . Chronic pain of left knee 09/11/2018  . Meniscus degeneration, left 09/11/2018  . Fibromyalgia 09/11/2018  . Chronic pain syndrome 09/11/2018  . Venous ulcer ([place]) 02/02/2017  . Varicose veins of bilateral lower extremities with pain 05/16/2016  . Chronic venous insufficiency 05/16/2016  . Pain in limb 05/16/2016  . Swelling of limb 05/16/2016  . DJD (degenerative joint disease) 05/16/2016      Laureen Abrahams, PT, DPT 09/21/20 3:12 PM   [facility] [address] [place], [place], [address] Phone: [phone]   Fax:  [phone]  Name: FLEETA KUNDE MRN: [id number] Date of Birth: February 15, 1956     PHYSICAL THERAPY DISCHARGE SUMMARY  Visits from Start of Care: 9  Current functional level related to goals / functional outcomes: See above   Remaining deficits: See above   Education / Equipment: HEP  Plan: Patient agrees to discharge.  Patient goals were met. Patient is being discharged due to meeting the stated rehab goals.  ?????     Laureen Abrahams, PT, DPT 09/30/20 2:14 PM  [facility] [address] [place], [place], [address] Phone: [phone]   Fax:  [phone]

## 2020-09-22 ENCOUNTER — Ambulatory Visit (INDEPENDENT_AMBULATORY_CARE_PROVIDER_SITE_OTHER): Payer: Medicare Other | Admitting: Orthopaedic Surgery

## 2020-09-22 ENCOUNTER — Ambulatory Visit (INDEPENDENT_AMBULATORY_CARE_PROVIDER_SITE_OTHER): Payer: Medicare Other

## 2020-09-22 ENCOUNTER — Encounter: Payer: Self-pay | Admitting: Orthopaedic Surgery

## 2020-09-22 ENCOUNTER — Telehealth: Payer: Self-pay | Admitting: Orthopaedic Surgery

## 2020-09-22 VITALS — Ht 63.0 in | Wt 172.0 lb

## 2020-09-22 DIAGNOSIS — M1712 Unilateral primary osteoarthritis, left knee: Secondary | ICD-10-CM

## 2020-09-22 DIAGNOSIS — Z96651 Presence of right artificial knee joint: Secondary | ICD-10-CM

## 2020-09-22 NOTE — Progress Notes (Signed)
Post-Op Visit Note   Patient: Anna Mccarthy           Date of Birth: 1956-03-24           MRN: [id number] Visit Date: 09/22/2020 PCP: [place] Pink, MD   Assessment & Plan:  Chief Complaint:  Chief Complaint  Patient presents with  . Right Knee - Follow-up    Right total knee arthroplasty 08/10/2020   Visit Diagnoses:  1. Hx of total knee replacement, right   2. Primary osteoarthritis of left knee   3. Status post total right knee replacement     Plan:   Anna Mccarthy is 6 weeks status post right total knee replacement on 08/10/2020.  She is progressing very well and not requiring any pain medications.  Takes Advil at times.  She is very happy with her progress so far.  She is eager to have the left knee replaced so that she can resume trips that she has had to postpone.  In terms of the right knee she has essentially been discharged from PT to home exercises.  She is able to get to more than 127 degrees of flexion with PT.  Physical exam shows a fully healed surgical scar.  Range of motion is 0 to 125 degrees.  Stable to varus valgus stress.  X-rays show a stable total knee replacement without any complications.  From my standpoint Anna Mccarthy is doing very well and recovering quickly and faster than expected.  I think it is fine to shoot for scheduling for the left total knee replacement in about 6 weeks.  Certainly we can change the timing if things change.  For the left knee she has also already undergone extensive conservative treatments in the form of cortisone injections, physical therapy, nerve blocks, oral and topical medications.  Follow-Up Instructions: Return for 2 week postop visit.   Orders:  Orders Placed This Encounter  Procedures  . XR Knee 1-2 Views Right   No orders of the defined types were placed in this encounter.   Imaging: XR Knee 1-2 Views Right  Result Date: 09/22/2020 Stable total knee replacement in good alignment    PMFS History: Patient Active Problem  List   Diagnosis Date Noted  . Status post total right knee replacement 08/10/2020  . Primary osteoarthritis of right knee 01/30/2020  . Chronic pain of left knee 09/11/2018  . Meniscus degeneration, left 09/11/2018  . Fibromyalgia 09/11/2018  . Chronic pain syndrome 09/11/2018  . Venous ulcer ([place]) 02/02/2017  . Varicose veins of bilateral lower extremities with pain 05/16/2016  . Chronic venous insufficiency 05/16/2016  . Pain in limb 05/16/2016  . Swelling of limb 05/16/2016  . DJD (degenerative joint disease) 05/16/2016   Past Medical History:  Diagnosis Date  . Allergy   . Anxiety   . Arthritis   . Bronchitis   . Cancer ([place])    skin  . Fibromyalgia   . GERD (gastroesophageal reflux disease)   . Hypertension   . Osteoarthritis     Family History  Problem Relation Age of Onset  . Breast cancer Maternal Aunt   . Ovarian cancer Sister   . Breast cancer Cousin     Past Surgical History:  Procedure Laterality Date  . ABDOMINAL HYSTERECTOMY  1996  . BREAST BIOPSY Left 2011   stereo done by dr. Bary Castilla -benign  . COLONOSCOPY    . COLONOSCOPY WITH PROPOFOL N/A 05/30/2016   Procedure: COLONOSCOPY WITH PROPOFOL;  Surgeon: Lollie Sails, MD;  Location: [facility] ENDOSCOPY;  Service: Endoscopy;  Laterality: N/A;  . DILATION AND CURETTAGE OF UTERUS     x3  . HERNIA REPAIR  2012  . KNEE ARTHROSCOPY WITH MEDIAL MENISECTOMY Left 02/18/2016   Procedure: KNEE ARTHROSCOPY WITH PARTIAL MEDIAL MENISECTOMY;  Surgeon: Hessie Knows, MD;  Location: [facility] ORS;  Service: Orthopedics;  Laterality: Left;  . TOTAL KNEE ARTHROPLASTY Right 08/10/2020   Procedure: RIGHT TOTAL KNEE ARTHROPLASTY;  Surgeon: Leandrew Koyanagi, MD;  Location: [place];  Service: Orthopedics;  Laterality: Right;  . TUBAL LIGATION  1984   Social History   Occupational History  . Not on file  Tobacco Use  . Smoking status: Never Smoker  . Smokeless tobacco: Never Used  Vaping Use  . Vaping Use: Never used  Substance and  Sexual Activity  . Alcohol use: Yes    Alcohol/week: 2.0 standard drinks    Types: 2 Glasses of wine per week  . Drug use: No  . Sexual activity: Not on file

## 2020-09-24 ENCOUNTER — Encounter: Payer: Medicare Other | Admitting: Physical Therapy

## 2020-09-28 ENCOUNTER — Encounter: Payer: Medicare Other | Admitting: Physical Therapy

## 2020-09-30 ENCOUNTER — Other Ambulatory Visit: Payer: Self-pay

## 2020-10-01 ENCOUNTER — Encounter: Payer: Medicare Other | Admitting: Rehabilitative and Restorative Service Providers"

## 2020-10-26 ENCOUNTER — Other Ambulatory Visit: Payer: Self-pay | Admitting: Physician Assistant

## 2020-10-26 MED ORDER — ASPIRIN EC 81 MG PO TBEC: 81.0000 mg | DELAYED_RELEASE_TABLET | Freq: Two times a day (BID) | ORAL | 0 refills | Status: DC

## 2020-10-26 MED ORDER — DOCUSATE SODIUM 100 MG PO CAPS: 100.0000 mg | ORAL_CAPSULE | Freq: Every day | ORAL | 2 refills | Status: DC | PRN

## 2020-10-26 MED ORDER — ONDANSETRON HCL 4 MG PO TABS: 4.0000 mg | ORAL_TABLET | Freq: Three times a day (TID) | ORAL | 0 refills | Status: DC | PRN

## 2020-10-26 MED ORDER — TIZANIDINE HCL 2 MG PO TABS: 2.0000 mg | ORAL_TABLET | Freq: Three times a day (TID) | ORAL | 0 refills | Status: DC | PRN

## 2020-10-26 MED ORDER — HYDROCODONE-ACETAMINOPHEN 7.5-325 MG PO TABS: 1.0000 | ORAL_TABLET | Freq: Four times a day (QID) | ORAL | 0 refills | Status: DC | PRN

## 2020-10-28 NOTE — Pre-Procedure Instructions (Signed)
Surgical Instructions:    Your procedure is scheduled on Monday 11/02/20 (07:45 AM- 10:00 AM).  Report to [facility] Main Entrance "A" at 05:45 A.M., then check in with the Admitting office.  Call this number if you have any questions prior to, or have any problems the morning of surgery:  [phone]    Remember:  Do not eat after midnight the night before your surgery.  You may drink clear liquids until 04:45 AM the morning of your surgery.   Clear liquids allowed are: Water, Non-Citrus Juices (without pulp), Carbonated Beverages, Clear Tea, Black Coffee Only, and Gatorade.   Enhanced Recovery after Surgery for Orthopedics Enhanced Recovery after Surgery is a protocol used to improve the stress on your body and your recovery after surgery.  Patient Instructions  . The night before surgery:  o No food after midnight. ONLY clear liquids after midnight   . The day of surgery (if you do NOT have diabetes):  o Drink ONE (1) Pre-Surgery Clear Ensure by 04:45 AM the morning of surgery.   o This drink was given to you during your hospital pre-op appointment visit. o Nothing else to drink after completing the Pre-Surgery Clear Ensure.         If you have questions, please contact your surgeon's office.      Take these medicines the morning of surgery with A SIP OF WATER: DULoxetine (CYMBALTA) gabapentin (NEURONTIN) omeprazole (PRILOSEC)   IF NEEDED: ALPRAZolam (XANAX)   >>STOP hydroxychloroquine (PLAQUENIL) 3 DAYS BEFORE SURGERY<<   As of today, STOP taking any Aspirin (unless otherwise instructed by your surgeon) Aleve, Naproxen, Ibuprofen, Motrin, Advil, Goody's, BC's, all herbal medications, fish oil, and all vitamins.              Special instructions:   [place]- Preparing For Surgery  Before surgery, you can play an important role. Because skin is not sterile, your skin needs to be as free of germs as possible. You can reduce the number of germs on your skin  by washing with CHG (chlorahexidine gluconate) Soap before surgery.  CHG is an antiseptic cleaner which kills germs and bonds with the skin to continue killing germs even after washing.    Oral Hygiene is also important to reduce your risk of infection.  Remember - BRUSH YOUR TEETH THE MORNING OF SURGERY WITH YOUR REGULAR TOOTHPASTE  Please do not use if you have an allergy to CHG or antibacterial soaps. If your skin becomes reddened/irritated stop using the CHG.  Do not shave (including legs and underarms) for at least 48 hours prior to first CHG shower. It is OK to shave your face.  Please follow these instructions carefully.   1. Shower the NIGHT BEFORE SURGERY and the MORNING OF SURGERY  2. If you chose to wash your hair, wash your hair first as usual with your normal shampoo.  3. After you shampoo, rinse your hair and body thoroughly to remove the shampoo.  4. Wash Face and genitals (private parts) with your normal soap.   5. Use CHG Soap as you would any other liquid soap. You can apply CHG directly to the skin and wash gently with a scrungie or a clean washcloth.   6. Apply the CHG Soap to your body ONLY FROM THE NECK DOWN.  Do not use on open wounds or open sores. Avoid contact with your eyes, ears, mouth and genitals (private parts). Wash Face and genitals (private parts)  with your normal soap.  7. Wash thoroughly, paying special attention to the area where your surgery will be performed.  8. Thoroughly rinse your body with warm water from the neck down.  9. DO NOT shower/wash with your normal soap after using and rinsing off the CHG Soap.  10. Pat yourself dry with a CLEAN TOWEL.  11. Wear CLEAN PAJAMAS to bed the night before surgery.  12. Place CLEAN SHEETS on your bed the night before your surgery.  13. DO NOT SLEEP WITH PETS.   Day of Surgery: SHOWER with CHG soap. Brush your teeth WITH YOUR REGULAR TOOTHPASTE. Wear Clean/Comfortable clothing the morning of  surgery. Do not apply any deodorants/lotions.   Do not wear jewelry, make up, or nail polish. Do not shave 48 hours prior to surgery.    Do NOT Smoke (Tobacco/Vaping) or drink Alcohol 24 hours prior to your procedure. Do not bring valuables to the hospital. [facility] is not responsible for any belongings or valuables.  If you use a CPAP at night, you may bring all equipment for your overnight stay.   Contacts, glasses, or dentures may not be worn into surgery, please bring cases for these belongings.   For patients admitted to the hospital, discharge time will be determined by your treatment team.   Patients discharged the day of surgery will not be allowed to drive home, and someone needs to stay with them for 24 hours.    Please read over the following fact sheets that you were given.

## 2020-10-29 ENCOUNTER — Encounter (HOSPITAL_COMMUNITY): Payer: Self-pay

## 2020-10-29 ENCOUNTER — Other Ambulatory Visit (HOSPITAL_COMMUNITY): Payer: Medicare Other

## 2020-10-29 ENCOUNTER — Ambulatory Visit (HOSPITAL_COMMUNITY)
Admission: RE | Admit: 2020-10-29 | Discharge: 2020-10-29 | Disposition: A | Discharge status: Home / Self Care | Payer: Medicare Other | Source: Ambulatory Visit | Attending: Physician Assistant | Admitting: Physician Assistant

## 2020-10-29 ENCOUNTER — Other Ambulatory Visit: Payer: Self-pay

## 2020-10-29 ENCOUNTER — Encounter (HOSPITAL_COMMUNITY)
Admission: RE | Admit: 2020-10-29 | Discharge: 2020-10-29 | Disposition: A | Discharge status: Home / Self Care | Payer: Medicare Other | Source: Ambulatory Visit | Attending: Orthopaedic Surgery | Admitting: Orthopaedic Surgery

## 2020-10-29 DIAGNOSIS — Z20822 Contact with and (suspected) exposure to covid-19: Secondary | ICD-10-CM | POA: Diagnosis not present

## 2020-10-29 DIAGNOSIS — M1712 Unilateral primary osteoarthritis, left knee: Secondary | ICD-10-CM | POA: Diagnosis not present

## 2020-10-29 DIAGNOSIS — Z01818 Encounter for other preprocedural examination: Secondary | ICD-10-CM | POA: Diagnosis present

## 2020-10-29 HISTORY — DX: Pneumonia, unspecified organism: J18.9

## 2020-10-29 LAB — CBC WITH DIFFERENTIAL/PLATELET
Abs Immature Granulocytes: 0.01 10*3/uL (ref 0.00–0.07)
Basophils Absolute: 0.1 10*3/uL (ref 0.0–0.1)
Basophils Relative: 1 %
Eosinophils Absolute: 0.2 10*3/uL (ref 0.0–0.5)
Eosinophils Relative: 4 %
HCT: 41.5 % (ref 36.0–46.0)
Hemoglobin: 13.2 g/dL (ref 12.0–15.0)
Immature Granulocytes: 0 %
Lymphocytes Relative: 38 %
Lymphs Abs: 1.8 10*3/uL (ref 0.7–4.0)
MCH: 28.1 pg (ref 26.0–34.0)
MCHC: 31.8 g/dL (ref 30.0–36.0)
MCV: 88.3 fL (ref 80.0–100.0)
Monocytes Absolute: 0.4 10*3/uL (ref 0.1–1.0)
Monocytes Relative: 7 %
Neutro Abs: 2.4 10*3/uL (ref 1.7–7.7)
Neutrophils Relative %: 50 %
Platelets: 333 10*3/uL (ref 150–400)
RBC: 4.7 MIL/uL (ref 3.87–5.11)
RDW: 13.8 % (ref 11.5–15.5)
WBC: 4.9 10*3/uL (ref 4.0–10.5)
nRBC: 0 % (ref 0.0–0.2)

## 2020-10-29 LAB — TYPE AND SCREEN
ABO/RH(D): A NEG
Antibody Screen: NEGATIVE

## 2020-10-29 LAB — PROTIME-INR
INR: 1 (ref 0.8–1.2)
Prothrombin Time: 13.1 seconds (ref 11.4–15.2)

## 2020-10-29 LAB — URINALYSIS, ROUTINE W REFLEX MICROSCOPIC
Bilirubin Urine: NEGATIVE
Glucose, UA: NEGATIVE mg/dL
Hgb urine dipstick: NEGATIVE
Ketones, ur: NEGATIVE mg/dL
Leukocytes,Ua: NEGATIVE
Nitrite: NEGATIVE
Protein, ur: NEGATIVE mg/dL
Specific Gravity, Urine: 1.009 (ref 1.005–1.030)
pH: 6 (ref 5.0–8.0)

## 2020-10-29 LAB — COMPREHENSIVE METABOLIC PANEL
ALT: 20 U/L (ref 0–44)
AST: 23 U/L (ref 15–41)
Albumin: 4 g/dL (ref 3.5–5.0)
Alkaline Phosphatase: 87 U/L (ref 38–126)
Anion gap: 9 (ref 5–15)
BUN: 14 mg/dL (ref 8–23)
CO2: 27 mmol/L (ref 22–32)
Calcium: 9.1 mg/dL (ref 8.9–10.3)
Chloride: 97 mmol/L — ABNORMAL LOW (ref 98–111)
Creatinine, Ser: 0.9 mg/dL (ref 0.44–1.00)
GFR, Estimated: >60 mL/min (ref >60)
Glucose, Bld: 105 mg/dL — ABNORMAL HIGH (ref 70–99)
Potassium: 3.4 mmol/L — ABNORMAL LOW (ref 3.5–5.1)
Sodium: 133 mmol/L — ABNORMAL LOW (ref 135–145)
Total Bilirubin: 0.7 mg/dL (ref 0.3–1.2)
Total Protein: 7.6 g/dL (ref 6.5–8.1)

## 2020-10-29 LAB — SURGICAL PCR SCREEN
MRSA, PCR: NEGATIVE
Staphylococcus aureus: POSITIVE — AB

## 2020-10-29 LAB — APTT: aPTT: 25 seconds (ref 24–36)

## 2020-10-29 LAB — SARS CORONAVIRUS 2 (TAT 6-24 HRS): SARS Coronavirus 2: NEGATIVE

## 2020-10-29 NOTE — Progress Notes (Signed)
PCP - [place] Pink, MD Cardiologist - Denies  PPM/ICD - Denies  Chest x-ray - 10/29/20 EKG - 10/29/20 Stress Test - 10/28/14; negative ECHO - Denies Cardiac Cath - Denies  Sleep Study - Denies   Patient denies being diabetic.   Blood Thinner Instructions: N/A Aspirin Instructions: Per pt, last dose ~ 09/07/20  ERAS Protcol - Yes PRE-SURGERY Ensure or G2- Ensure given  COVID TEST- 10/29/20; results pending   Anesthesia review: No  Patient denies shortness of breath, fever, cough and chest pain at PAT appointment   All instructions explained to the patient, with a verbal understanding of the material. Patient agrees to go over the instructions while at home for a better understanding. Patient also instructed to self quarantine after being tested for COVID-19. The opportunity to ask questions was provided.

## 2020-10-30 MED ORDER — TRANEXAMIC ACID 1000 MG/10ML IV SOLN
2000.0000 mg | INTRAVENOUS | Status: AC
  Administered 2020-11-02: 2000 mg via TOPICAL
  Filled 2020-10-30: qty 20

## 2020-11-01 ENCOUNTER — Encounter (HOSPITAL_COMMUNITY): Payer: Self-pay | Admitting: Orthopaedic Surgery

## 2020-11-01 NOTE — Anesthesia Preprocedure Evaluation (Addendum)
Anesthesia Evaluation  Patient identified by MRN, date of birth, ID band Patient awake    Reviewed: Allergy & Precautions, NPO status , Patient's Chart, lab work & pertinent test results  Airway Mallampati: II  TM Distance: >3 FB Neck ROM: Full    Dental no notable dental hx. (+) Teeth Intact, Dental Advisory Given   Pulmonary neg pulmonary ROS,    Pulmonary exam normal breath sounds clear to auscultation       Cardiovascular hypertension, Pt. on medications Normal cardiovascular exam Rhythm:Regular Rate:Normal  2-10 EKG SR R 68 w NSST changes   Neuro/Psych Anxiety negative neurological ROS     GI/Hepatic GERD  Medicated and Controlled,  Endo/Other  negative endocrine ROS  Renal/GU negative Renal ROSK+ 3.4 C r0.90     Musculoskeletal  (+) Arthritis , Rheumatoid disorders,  Fibromyalgia -, narcotic dependent  Abdominal (+) + obese,   Peds  Hematology negative hematology ROS (+) hgb 13.2 plt 333  T&S Available   Anesthesia Other Findings   Reproductive/Obstetrics                           Anesthesia Physical Anesthesia Plan  ASA: II  Anesthesia Plan: Spinal   Post-op Pain Management:  Regional for Post-op pain   Induction:   PONV Risk Score and Plan: Midazolam, Treatment may vary due to age or medical condition and Ondansetron  Airway Management Planned: Natural Airway and Nasal Cannula  Additional Equipment: None  Intra-op Plan:   Post-operative Plan:   Informed Consent: I have reviewed the patients History and Physical, chart, labs and discussed the procedure including the risks, benefits and alternatives for the proposed anesthesia with the patient or authorized representative who has indicated his/her understanding and acceptance.     Dental advisory given  Plan Discussed with:   Anesthesia Plan Comments: (Sp w L Adductor LMA Used kast time )       Anesthesia  Quick Evaluation

## 2020-11-02 ENCOUNTER — Ambulatory Visit (HOSPITAL_COMMUNITY): Payer: Medicare Other | Admitting: Certified Registered Nurse Anesthetist

## 2020-11-02 ENCOUNTER — Encounter (HOSPITAL_COMMUNITY)
Admission: RE | Disposition: A | Discharge status: Home / Self Care | Payer: Self-pay | Source: Ambulatory Visit | Attending: Orthopaedic Surgery

## 2020-11-02 ENCOUNTER — Other Ambulatory Visit: Payer: Self-pay

## 2020-11-02 ENCOUNTER — Observation Stay (HOSPITAL_COMMUNITY)
Admission: RE | Admit: 2020-11-02 | Discharge: 2020-11-03 | Disposition: A | Discharge status: Home / Self Care | Payer: Medicare Other | Source: Ambulatory Visit | Attending: Orthopaedic Surgery | Admitting: Orthopaedic Surgery

## 2020-11-02 ENCOUNTER — Observation Stay (HOSPITAL_COMMUNITY): Payer: Medicare Other

## 2020-11-02 ENCOUNTER — Encounter (HOSPITAL_COMMUNITY): Payer: Self-pay | Admitting: Orthopaedic Surgery

## 2020-11-02 DIAGNOSIS — I10 Essential (primary) hypertension: Secondary | ICD-10-CM | POA: Diagnosis not present

## 2020-11-02 DIAGNOSIS — Z79899 Other long term (current) drug therapy: Secondary | ICD-10-CM | POA: Insufficient documentation

## 2020-11-02 DIAGNOSIS — Z85828 Personal history of other malignant neoplasm of skin: Secondary | ICD-10-CM | POA: Insufficient documentation

## 2020-11-02 DIAGNOSIS — M1712 Unilateral primary osteoarthritis, left knee: Principal | ICD-10-CM | POA: Insufficient documentation

## 2020-11-02 DIAGNOSIS — Z7982 Long term (current) use of aspirin: Secondary | ICD-10-CM | POA: Insufficient documentation

## 2020-11-02 DIAGNOSIS — Z96651 Presence of right artificial knee joint: Secondary | ICD-10-CM | POA: Insufficient documentation

## 2020-11-02 DIAGNOSIS — R52 Pain, unspecified: Secondary | ICD-10-CM

## 2020-11-02 DIAGNOSIS — Z96652 Presence of left artificial knee joint: Secondary | ICD-10-CM

## 2020-11-02 HISTORY — PX: TOTAL KNEE ARTHROPLASTY: SHX125

## 2020-11-02 SURGERY — ARTHROPLASTY, KNEE, TOTAL
Anesthesia: Spinal | Site: Knee | Laterality: Left

## 2020-11-02 MED ORDER — MAGNESIUM CITRATE PO SOLN: 1.0000 | Freq: Once | ORAL | Status: DC | PRN

## 2020-11-02 MED ORDER — PHENOL 1.4 % MT LIQD: 1.0000 | OROMUCOSAL | Status: DC | PRN

## 2020-11-02 MED ORDER — DIPHENHYDRAMINE HCL 12.5 MG/5ML PO ELIX: 25.0000 mg | ORAL_SOLUTION | ORAL | Status: DC | PRN

## 2020-11-02 MED ORDER — VANCOMYCIN HCL 1000 MG IV SOLR
INTRAVENOUS | Status: DC | PRN
  Administered 2020-11-02: 1000 mg via TOPICAL

## 2020-11-02 MED ORDER — ONDANSETRON HCL 4 MG/2ML IJ SOLN: 4.0000 mg | Freq: Four times a day (QID) | INTRAMUSCULAR | Status: DC | PRN

## 2020-11-02 MED ORDER — ACETAMINOPHEN 325 MG PO TABS: 325.0000 mg | ORAL_TABLET | Freq: Four times a day (QID) | ORAL | Status: DC | PRN

## 2020-11-02 MED ORDER — MIDAZOLAM HCL 5 MG/5ML IJ SOLN
INTRAMUSCULAR | Status: DC | PRN
  Administered 2020-11-02 (×2): 1 mg via INTRAVENOUS

## 2020-11-02 MED ORDER — SORBITOL 70 % SOLN
30.0000 mL | Freq: Every day | Status: DC | PRN
  Filled 2020-11-02: qty 30

## 2020-11-02 MED ORDER — CEFAZOLIN SODIUM-DEXTROSE 2-4 GM/100ML-% IV SOLN
2.0000 g | INTRAVENOUS | Status: AC
  Administered 2020-11-02: 2 g via INTRAVENOUS
  Filled 2020-11-02: qty 100

## 2020-11-02 MED ORDER — LACTATED RINGERS IV SOLN: INTRAVENOUS | Status: DC

## 2020-11-02 MED ORDER — TRANEXAMIC ACID-NACL 1000-0.7 MG/100ML-% IV SOLN
INTRAVENOUS | Status: AC
  Filled 2020-11-02: qty 100

## 2020-11-02 MED ORDER — MIDAZOLAM HCL 2 MG/2ML IJ SOLN
INTRAMUSCULAR | Status: AC
  Filled 2020-11-02: qty 2

## 2020-11-02 MED ORDER — CEFAZOLIN SODIUM-DEXTROSE 2-4 GM/100ML-% IV SOLN
2.0000 g | Freq: Four times a day (QID) | INTRAVENOUS | Status: AC
  Administered 2020-11-02 (×2): 2 g via INTRAVENOUS
  Filled 2020-11-02 (×2): qty 100

## 2020-11-02 MED ORDER — BUPIVACAINE-MELOXICAM ER 400-12 MG/14ML IJ SOLN
INTRAMUSCULAR | Status: DC | PRN
  Administered 2020-11-02: 400 mg

## 2020-11-02 MED ORDER — TRANEXAMIC ACID-NACL 1000-0.7 MG/100ML-% IV SOLN
1000.0000 mg | Freq: Once | INTRAVENOUS | Status: AC
  Administered 2020-11-02: 1000 mg via INTRAVENOUS
  Filled 2020-11-02: qty 100

## 2020-11-02 MED ORDER — BUPIVACAINE IN DEXTROSE 0.75-8.25 % IT SOLN
INTRATHECAL | Status: DC | PRN
  Administered 2020-11-02: 13.5 mg via INTRATHECAL

## 2020-11-02 MED ORDER — ALPRAZOLAM 0.25 MG PO TABS: 0.2500 mg | ORAL_TABLET | Freq: Two times a day (BID) | ORAL | Status: DC | PRN

## 2020-11-02 MED ORDER — PANTOPRAZOLE SODIUM 40 MG PO TBEC
40.0000 mg | DELAYED_RELEASE_TABLET | Freq: Every day | ORAL | Status: DC
  Administered 2020-11-02 – 2020-11-03 (×2): 40 mg via ORAL
  Filled 2020-11-02 (×2): qty 1

## 2020-11-02 MED ORDER — CHLORHEXIDINE GLUCONATE 0.12 % MT SOLN
15.0000 mL | Freq: Once | OROMUCOSAL | Status: AC
  Administered 2020-11-02: 15 mL via OROMUCOSAL
  Filled 2020-11-02: qty 15

## 2020-11-02 MED ORDER — METHOCARBAMOL 1000 MG/10ML IJ SOLN
500.0000 mg | Freq: Four times a day (QID) | INTRAMUSCULAR | Status: DC | PRN
  Filled 2020-11-02: qty 5

## 2020-11-02 MED ORDER — METHOCARBAMOL 500 MG PO TABS
500.0000 mg | ORAL_TABLET | Freq: Four times a day (QID) | ORAL | Status: DC | PRN
  Administered 2020-11-02: 500 mg via ORAL
  Filled 2020-11-02: qty 1

## 2020-11-02 MED ORDER — ONDANSETRON HCL 4 MG/2ML IJ SOLN: 4.0000 mg | Freq: Once | INTRAMUSCULAR | Status: DC | PRN

## 2020-11-02 MED ORDER — MENTHOL 3 MG MT LOZG: 1.0000 | LOZENGE | OROMUCOSAL | Status: DC | PRN

## 2020-11-02 MED ORDER — ASPIRIN 81 MG PO CHEW
81.0000 mg | CHEWABLE_TABLET | Freq: Two times a day (BID) | ORAL | Status: DC
  Administered 2020-11-02 – 2020-11-03 (×2): 81 mg via ORAL
  Filled 2020-11-02 (×2): qty 1

## 2020-11-02 MED ORDER — VANCOMYCIN HCL 1000 MG IV SOLR
INTRAVENOUS | Status: AC
  Filled 2020-11-02: qty 1000

## 2020-11-02 MED ORDER — ROPIVACAINE HCL 7.5 MG/ML IJ SOLN
INTRAMUSCULAR | Status: DC | PRN
  Administered 2020-11-02: 20 mL via PERINEURAL

## 2020-11-02 MED ORDER — BUPIVACAINE-MELOXICAM ER 400-12 MG/14ML IJ SOLN
INTRAMUSCULAR | Status: AC
  Filled 2020-11-02: qty 1

## 2020-11-02 MED ORDER — METOCLOPRAMIDE HCL 5 MG PO TABS
5.0000 mg | ORAL_TABLET | Freq: Three times a day (TID) | ORAL | Status: DC | PRN
Start: 2020-11-02 — End: 2020-11-03

## 2020-11-02 MED ORDER — PROPOFOL 500 MG/50ML IV EMUL
INTRAVENOUS | Status: DC | PRN
  Administered 2020-11-02: 75 ug/kg/min via INTRAVENOUS

## 2020-11-02 MED ORDER — POLYETHYLENE GLYCOL 3350 17 G PO PACK
17.0000 g | PACK | Freq: Every day | ORAL | Status: DC
  Administered 2020-11-02: 17 g via ORAL
  Filled 2020-11-02 (×2): qty 1

## 2020-11-02 MED ORDER — ONDANSETRON HCL 4 MG PO TABS: 4.0000 mg | ORAL_TABLET | Freq: Four times a day (QID) | ORAL | Status: DC | PRN

## 2020-11-02 MED ORDER — FENTANYL CITRATE (PF) 250 MCG/5ML IJ SOLN
INTRAMUSCULAR | Status: AC
  Filled 2020-11-02: qty 5

## 2020-11-02 MED ORDER — POVIDONE-IODINE 10 % EX SWAB
2.0000 "application " | Freq: Once | CUTANEOUS | Status: AC
  Administered 2020-11-02: 2 via TOPICAL

## 2020-11-02 MED ORDER — TRANEXAMIC ACID-NACL 1000-0.7 MG/100ML-% IV SOLN
1000.0000 mg | INTRAVENOUS | Status: AC
  Administered 2020-11-02: 1000 mg via INTRAVENOUS
  Filled 2020-11-02: qty 100

## 2020-11-02 MED ORDER — SODIUM CHLORIDE 0.9 % IV SOLN: INTRAVENOUS | Status: DC

## 2020-11-02 MED ORDER — 0.9 % SODIUM CHLORIDE (POUR BTL) OPTIME
TOPICAL | Status: DC | PRN
  Administered 2020-11-02: 1000 mL

## 2020-11-02 MED ORDER — PHENYLEPHRINE HCL-NACL 10-0.9 MG/250ML-% IV SOLN
INTRAVENOUS | Status: DC | PRN
  Administered 2020-11-02: 25 ug/min via INTRAVENOUS
  Administered 2020-11-02: 15 ug/min via INTRAVENOUS

## 2020-11-02 MED ORDER — FENTANYL CITRATE (PF) 100 MCG/2ML IJ SOLN: 25.0000 ug | INTRAMUSCULAR | Status: DC | PRN

## 2020-11-02 MED ORDER — HYDROXYCHLOROQUINE SULFATE 200 MG PO TABS
200.0000 mg | ORAL_TABLET | Freq: Two times a day (BID) | ORAL | Status: DC
  Administered 2020-11-02 – 2020-11-03 (×3): 200 mg via ORAL
  Filled 2020-11-02 (×3): qty 1

## 2020-11-02 MED ORDER — ORAL CARE MOUTH RINSE: 15.0000 mL | Freq: Once | OROMUCOSAL | Status: AC

## 2020-11-02 MED ORDER — SODIUM CHLORIDE 0.9 % IR SOLN
Status: DC | PRN
  Administered 2020-11-02: 1000 mL

## 2020-11-02 MED ORDER — MORPHINE SULFATE (PF) 2 MG/ML IV SOLN
0.5000 mg | INTRAVENOUS | Status: DC | PRN
  Administered 2020-11-02: 1 mg via INTRAVENOUS
  Filled 2020-11-02: qty 1

## 2020-11-02 MED ORDER — HYDROCODONE-ACETAMINOPHEN 7.5-325 MG PO TABS
1.0000 | ORAL_TABLET | ORAL | Status: DC | PRN
Start: 2020-11-02 — End: 2020-11-03

## 2020-11-02 MED ORDER — DULOXETINE HCL 20 MG PO CPEP
20.0000 mg | ORAL_CAPSULE | Freq: Two times a day (BID) | ORAL | Status: DC
  Administered 2020-11-02 – 2020-11-03 (×2): 20 mg via ORAL
  Filled 2020-11-02 (×2): qty 1

## 2020-11-02 MED ORDER — ESCITALOPRAM OXALATE 10 MG PO TABS
20.0000 mg | ORAL_TABLET | Freq: Every day | ORAL | Status: DC
  Administered 2020-11-02: 20 mg via ORAL
  Filled 2020-11-02: qty 2

## 2020-11-02 MED ORDER — ALUM & MAG HYDROXIDE-SIMETH 200-200-20 MG/5ML PO SUSP: 30.0000 mL | ORAL | Status: DC | PRN

## 2020-11-02 MED ORDER — CLONIDINE HCL (ANALGESIA) 100 MCG/ML EP SOLN
EPIDURAL | Status: DC | PRN
  Administered 2020-11-02: 100 ug

## 2020-11-02 MED ORDER — IRRISEPT - 450ML BOTTLE WITH 0.05% CHG IN STERILE WATER, USP 99.95% OPTIME
TOPICAL | Status: DC | PRN
  Administered 2020-11-02: 450 mL via TOPICAL

## 2020-11-02 MED ORDER — DOCUSATE SODIUM 100 MG PO CAPS
100.0000 mg | ORAL_CAPSULE | Freq: Two times a day (BID) | ORAL | Status: DC
  Administered 2020-11-02 – 2020-11-03 (×3): 100 mg via ORAL
  Filled 2020-11-02 (×3): qty 1

## 2020-11-02 MED ORDER — METOCLOPRAMIDE HCL 5 MG/ML IJ SOLN: 5.0000 mg | Freq: Three times a day (TID) | INTRAMUSCULAR | Status: DC | PRN

## 2020-11-02 MED ORDER — ACETAMINOPHEN 10 MG/ML IV SOLN: 1000.0000 mg | Freq: Once | INTRAVENOUS | Status: DC | PRN

## 2020-11-02 MED ORDER — PROPOFOL 10 MG/ML IV BOLUS
INTRAVENOUS | Status: AC
  Filled 2020-11-02: qty 20

## 2020-11-02 MED ORDER — HYDROCODONE-ACETAMINOPHEN 5-325 MG PO TABS
1.0000 | ORAL_TABLET | ORAL | Status: DC | PRN
  Administered 2020-11-02: 1 via ORAL
  Administered 2020-11-02 – 2020-11-03 (×3): 2 via ORAL
  Filled 2020-11-02 (×2): qty 2
  Filled 2020-11-02: qty 1
  Filled 2020-11-02: qty 2

## 2020-11-02 MED ORDER — FENTANYL CITRATE (PF) 100 MCG/2ML IJ SOLN
INTRAMUSCULAR | Status: DC | PRN
  Administered 2020-11-02: 50 ug via INTRAVENOUS

## 2020-11-02 MED ORDER — KETOROLAC TROMETHAMINE 15 MG/ML IJ SOLN
15.0000 mg | Freq: Four times a day (QID) | INTRAMUSCULAR | Status: AC
  Administered 2020-11-02 – 2020-11-03 (×4): 15 mg via INTRAVENOUS
  Filled 2020-11-02 (×4): qty 1

## 2020-11-02 MED ORDER — BUPIVACAINE-MELOXICAM ER 200-6 MG/7ML IJ SOLN
INTRAMUSCULAR | Status: AC
  Filled 2020-11-02: qty 1

## 2020-11-02 SURGICAL SUPPLY — 77 items
ALCOHOL 70% 16 OZ (MISCELLANEOUS) ×2 IMPLANT
BAG DECANTER FOR FLEXI CONT (MISCELLANEOUS) ×2 IMPLANT
BANDAGE ESMARK 6X9 LF (GAUZE/BANDAGES/DRESSINGS) IMPLANT
BLADE SAG 18X100X1.27 (BLADE) ×2 IMPLANT
BNDG ESMARK 6X9 LF (GAUZE/BANDAGES/DRESSINGS)
BOWL SMART MIX CTS (DISPOSABLE) ×2 IMPLANT
CEMENT BONE REFOBACIN R1X40 US (Cement) ×4 IMPLANT
CLSR STERI-STRIP ANTIMIC 1/2X4 (GAUZE/BANDAGES/DRESSINGS) ×2 IMPLANT
COMP FEM CEMT PERS SZ 7 LT (Joint) ×2 IMPLANT
COMPONENT FEM CMT PERS SZ7 LT (Joint) ×1 IMPLANT
COOLER ICEMAN CLASSIC (MISCELLANEOUS) ×2 IMPLANT
COVER SURGICAL LIGHT HANDLE (MISCELLANEOUS) ×2 IMPLANT
COVER WAND RF STERILE (DRAPES) IMPLANT
CUFF TOURN SGL QUICK 34 (TOURNIQUET CUFF) ×1
CUFF TOURN SGL QUICK 42 (TOURNIQUET CUFF) IMPLANT
CUFF TRNQT CYL 34X4.125X (TOURNIQUET CUFF) ×1 IMPLANT
DERMABOND ADVANCED (GAUZE/BANDAGES/DRESSINGS) ×1
DERMABOND ADVANCED .7 DNX12 (GAUZE/BANDAGES/DRESSINGS) ×1 IMPLANT
DRAPE EXTREMITY T 121X128X90 (DISPOSABLE) ×2 IMPLANT
DRAPE HALF SHEET 40X57 (DRAPES) ×2 IMPLANT
DRAPE INCISE IOBAN 66X45 STRL (DRAPES) IMPLANT
DRAPE ORTHO SPLIT 77X108 STRL (DRAPES) ×2
DRAPE POUCH INSTRU U-SHP 10X18 (DRAPES) ×2 IMPLANT
DRAPE SURG ORHT 6 SPLT 77X108 (DRAPES) ×2 IMPLANT
DRAPE U-SHAPE 47X51 STRL (DRAPES) ×4 IMPLANT
DRSG AQUACEL AG ADV 3.5X10 (GAUZE/BANDAGES/DRESSINGS) ×2 IMPLANT
DURAPREP 26ML APPLICATOR (WOUND CARE) ×8 IMPLANT
ELECT CAUTERY BLADE 6.4 (BLADE) ×2 IMPLANT
ELECT REM PT RETURN 9FT ADLT (ELECTROSURGICAL) ×2
ELECTRODE REM PT RTRN 9FT ADLT (ELECTROSURGICAL) ×1 IMPLANT
GLOVE ECLIPSE 7.0 STRL STRAW (GLOVE) ×6 IMPLANT
GLOVE SKINSENSE NS SZ7.5 (GLOVE) ×3
GLOVE SKINSENSE STRL SZ7.5 (GLOVE) ×3 IMPLANT
GLOVE SURG SYN 7.5  E (GLOVE) ×4
GLOVE SURG SYN 7.5 E (GLOVE) ×4 IMPLANT
GLOVE SURG UNDER POLY LF SZ7 (GLOVE) ×2 IMPLANT
GOWN STRL REIN XL XLG (GOWN DISPOSABLE) ×2 IMPLANT
GOWN STRL REUS W/ TWL LRG LVL3 (GOWN DISPOSABLE) ×1 IMPLANT
GOWN STRL REUS W/TWL LRG LVL3 (GOWN DISPOSABLE) ×1
HANDPIECE INTERPULSE COAX TIP (DISPOSABLE) ×1
HDLS TROCR DRIL PIN KNEE 75 (PIN) ×1
HOOD PEEL AWAY FLYTE STAYCOOL (MISCELLANEOUS) ×4 IMPLANT
JET LAVAGE IRRISEPT WOUND (IRRIGATION / IRRIGATOR) ×2
KIT BASIN OR (CUSTOM PROCEDURE TRAY) ×2 IMPLANT
KIT TURNOVER KIT B (KITS) ×2 IMPLANT
LAVAGE JET IRRISEPT WOUND (IRRIGATION / IRRIGATOR) ×1 IMPLANT
MANIFOLD NEPTUNE II (INSTRUMENTS) ×2 IMPLANT
MARKER SKIN DUAL TIP RULER LAB (MISCELLANEOUS) ×2 IMPLANT
NEEDLE SPNL 18GX3.5 QUINCKE PK (NEEDLE) ×4 IMPLANT
NS IRRIG 1000ML POUR BTL (IV SOLUTION) ×2 IMPLANT
PACK TOTAL JOINT (CUSTOM PROCEDURE TRAY) ×2 IMPLANT
PAD ARMBOARD 7.5X6 YLW CONV (MISCELLANEOUS) ×4 IMPLANT
PAD COLD SHLDR WRAP-ON (PAD) ×2 IMPLANT
PIN DRILL HDLS TROCAR 75 4PK (PIN) ×1 IMPLANT
SAW OSC TIP CART 19.5X105X1.3 (SAW) ×2 IMPLANT
SCREW FEMALE HEX FIX 25X2.5 (ORTHOPEDIC DISPOSABLE SUPPLIES) ×2 IMPLANT
SET HNDPC FAN SPRY TIP SCT (DISPOSABLE) ×1 IMPLANT
STAPLER VISISTAT 35W (STAPLE) IMPLANT
STEM ASF PERS 18 6-7/CD LT (Stem) ×2 IMPLANT
STEM POLY PAT PLY 32M KNEE (Knees) ×2 IMPLANT
STEM TIBIA 5 DEG SZ D L KNEE (Knees) ×1 IMPLANT
SUCTION FRAZIER HANDLE 10FR (MISCELLANEOUS) ×1
SUCTION TUBE FRAZIER 10FR DISP (MISCELLANEOUS) ×1 IMPLANT
SUT ETHILON 2 0 FS 18 (SUTURE) IMPLANT
SUT MNCRL AB 4-0 PS2 18 (SUTURE) IMPLANT
SUT VIC AB 0 CT1 27 (SUTURE) ×2
SUT VIC AB 0 CT1 27XBRD ANBCTR (SUTURE) ×2 IMPLANT
SUT VIC AB 1 CTX 27 (SUTURE) ×6 IMPLANT
SUT VIC AB 2-0 CT1 27 (SUTURE) ×4
SUT VIC AB 2-0 CT1 TAPERPNT 27 (SUTURE) ×4 IMPLANT
SYR 50ML LL SCALE MARK (SYRINGE) ×4 IMPLANT
TIBIA STEM 5 DEG SZ D L KNEE (Knees) ×2 IMPLANT
TOWEL GREEN STERILE (TOWEL DISPOSABLE) ×2 IMPLANT
TOWEL GREEN STERILE FF (TOWEL DISPOSABLE) ×2 IMPLANT
TRAY CATH 16FR W/PLASTIC CATH (SET/KITS/TRAYS/PACK) IMPLANT
UNDERPAD 30X36 HEAVY ABSORB (UNDERPADS AND DIAPERS) ×2 IMPLANT
WRAP KNEE MAXI GEL POST OP (GAUZE/BANDAGES/DRESSINGS) IMPLANT

## 2020-11-02 NOTE — H&P (Signed)
PREOPERATIVE H&P  Chief Complaint: left knee degenerative joint diesease  HPI: Anna Mccarthy is a 65 y.o. female who presents for surgical treatment of left knee degenerative joint diesease.  She denies any changes in medical history.  Past Medical History:  Diagnosis Date  . Allergy   . Anxiety   . Arthritis   . Bronchitis   . Cancer ([place])    skin  . Fibromyalgia   . GERD (gastroesophageal reflux disease)   . Hypertension   . Osteoarthritis   . Pneumonia    Past Surgical History:  Procedure Laterality Date  . ABDOMINAL HYSTERECTOMY  1996  . BREAST BIOPSY Left 2011   stereo done by dr. Bary Castilla -benign  . COLONOSCOPY    . COLONOSCOPY WITH PROPOFOL N/A 05/30/2016   Procedure: COLONOSCOPY WITH PROPOFOL;  Surgeon: Lollie Sails, MD;  Location[facility] ENDOSCOPY;  Service: Endoscopy;  Laterality: N/A;  . DILATION AND CURETTAGE OF UTERUS     x3  . HERNIA REPAIR  2012  . JOINT REPLACEMENT    . KNEE ARTHROSCOPY WITH MEDIAL MENISECTOMY Left 02/18/2016   Procedure: KNEE ARTHROSCOPY WITH PARTIAL MEDIAL MENISECTOMY;  Surgeon: Hessie Knows, MD;  Location: [facility] ORS;  Service: Orthopedics;  Laterality: Left;  . TOTAL KNEE ARTHROPLASTY Right 08/10/2020   Procedure: RIGHT TOTAL KNEE ARTHROPLASTY;  Surgeon: Leandrew Koyanagi, MD;  Location: [place];  Service: Orthopedics;  Laterality: Right;  . TUBAL LIGATION  1984  . VARICOSE VEIN SURGERY Bilateral    Social History   Socioeconomic History  . Marital status: Married    Spouse name: Not on file  . Number of children: Not on file  . Years of education: Not on file  . Highest education level: Not on file  Occupational History  . Not on file  Tobacco Use  . Smoking status: Never Smoker  . Smokeless tobacco: Never Used  Vaping Use  . Vaping Use: Never used  Substance and Sexual Activity  . Alcohol use: Yes    Comment: once per month  . Drug use: No  . Sexual activity: Not on file  Other Topics Concern  . Not on file  Social  History Narrative  . Not on file   Social Determinants of Health   Financial Resource Strain: Not on file  Food Insecurity: Not on file  Transportation Needs: Not on file  Physical Activity: Not on file  Stress: Not on file  Social Connections: Not on file   Family History  Problem Relation Age of Onset  . Breast cancer Maternal Aunt   . Ovarian cancer Sister   . Breast cancer Cousin    Allergies  Allergen Reactions  . Percocet [Oxycodone-Acetaminophen] Itching  . Sulfa Antibiotics     Gi problems    Prior to Admission medications   Medication Sig Start Date End Date Taking? Authorizing Provider  ALPRAZolam (XANAX) 0.25 MG tablet Take 0.25 mg by mouth 2 (two) times daily as needed for anxiety. 11/17/16  Yes [provider]  aspirin EC 81 MG tablet Take 1 tablet (81 mg total) by mouth 2 (two) times daily. To be taken after surgery for dvt porphylaxis 10/26/20   Aundra Dubin, PA-C  docusate sodium (COLACE) 100 MG capsule Take 1 capsule (100 mg total) by mouth daily as needed. 10/26/20 10/26/21  Aundra Dubin, PA-C  DULoxetine (CYMBALTA) 20 MG capsule Take 20 mg by mouth 2 (two) times daily. 03/22/17  Yes [provider]  escitalopram (  LEXAPRO) 20 MG tablet Take 20 mg by mouth at bedtime.    Yes [provider]  fluocinonide (LIDEX) 0.05 % external solution Apply 1 application topically 2 (two) times daily as needed (acne). 09/06/18  Yes [provider]  gabapentin (NEURONTIN) 100 MG capsule Take 2 capsules (200 mg total) by mouth in the morning, at noon, and at bedtime. 04/20/20 07/19/20 Yes Gillis Santa, MD  hydrochlorothiazide (HYDRODIURIL) 25 MG tablet Take 25 mg by mouth daily.   Yes [provider]  HYDROcodone-acetaminophen (NORCO) 7.5-325 MG tablet Take 1-2 tablets by mouth every 6 (six) hours as needed for moderate pain. To be taken after surgery 10/26/20   Aundra Dubin, PA-C  hydroxychloroquine (PLAQUENIL) 200 MG tablet Take 200  mg by mouth 2 (two) times daily. 06/25/18  Yes [provider]  omeprazole (PRILOSEC) 40 MG capsule Take 40 mg by mouth daily. 08/10/15  Yes [provider]  ondansetron (ZOFRAN) 4 MG tablet Take 1 tablet (4 mg total) by mouth every 8 (eight) hours as needed for nausea or vomiting. To be taken after surgery 10/26/20   Aundra Dubin, PA-C  tiZANidine (ZANAFLEX) 2 MG tablet Take 1 tablet (2 mg total) by mouth every 8 (eight) hours as needed for muscle spasms. To be taken after surgery 10/26/20   Aundra Dubin, PA-C  Vitamin D, Ergocalciferol, (DRISDOL) 1.25 MG (50000 UNIT) CAPS capsule Take 50,000 Units by mouth every Sunday. 05/11/20  Yes [provider]  amoxicillin (AMOXIL) 500 MG capsule Take 4 pills one hour prior to dental cleaning 09/04/20   Aundra Dubin, PA-C  diclofenac Sodium (VOLTAREN) 1 % GEL Apply 2 g topically 4 (four) times daily. Patient not taking: Reported on 10/21/2020 08/25/20   Aundra Dubin, PA-C     Positive ROS: All other systems have been reviewed and were otherwise negative with the exception of those mentioned in the HPI and as above.  Physical Exam: General: Alert, no acute distress Cardiovascular: No pedal edema Respiratory: No cyanosis, no use of accessory musculature GI: abdomen soft Skin: No lesions in the area of chief complaint Neurologic: Sensation intact distally Psychiatric: Patient is competent for consent with normal mood and affect Lymphatic: no lymphedema  MUSCULOSKELETAL: exam stable  Assessment: left knee degenerative joint diesease  Plan: Plan for Procedure(s): LEFT TOTAL KNEE ARTHROPLASTY  The risks benefits and alternatives were discussed with the patient including but not limited to the risks of nonoperative treatment, versus surgical intervention including infection, bleeding, nerve injury,  blood clots, cardiopulmonary complications, morbidity, mortality, among others, and they were willing to proceed.    Preoperative templating of the joint replacement has been completed, documented, and submitted to the Operating Room personnel in order to optimize intra-operative equipment management.   Eduard Roux, MD 11/02/2020 6:07 AM

## 2020-11-02 NOTE — Anesthesia Procedure Notes (Signed)
Procedure Name: MAC Date/Time: 11/02/2020 7:53 AM Performed by: Candis Shine, CRNA Pre-anesthesia Checklist: Patient identified, Emergency Drugs available, Suction available and Patient being monitored Patient Re-evaluated:Patient Re-evaluated prior to induction Oxygen Delivery Method: Simple face mask Dental Injury: Teeth and Oropharynx as per pre-operative assessment

## 2020-11-02 NOTE — Evaluation (Signed)
Physical Therapy Evaluation Patient Details Name: Anna Mccarthy MRN: [id number] DOB: Nov 13, 1955 Today's Date: 11/02/2020   History of Present Illness  Pt is a 65 y/o female s/p L TKA on 5/9. PMH includes R TKA, fibromyalgia, HTN, GERD, and skin CA.  Clinical Impression  Pt is s/p surgery above with deficits below. Pt tolerated mobility very well this session. Requiring min guard to supervision for mobility tasks using RW. Reviewed knee precautions with pt; pt already familiar as she had other knee replaced in 07/2020. Will continue to follow acutely to maximize functional mobility independence and safety.     Follow Up Recommendations Follow surgeon's recommendation for DC plan and follow-up therapies    Equipment Recommendations  None recommended by PT    Recommendations for Other Services       Precautions / Restrictions Precautions Precautions: Knee Precaution Booklet Issued: No Precaution Comments: Verbally reviewed knee precautions. Restrictions Weight Bearing Restrictions: Yes LLE Weight Bearing: Weight bearing as tolerated      Mobility  Bed Mobility Overal bed mobility: Needs Assistance Bed Mobility: Sit to Supine       Sit to supine: Supervision   General bed mobility comments: Pt sitting up in chair upon entry. Supervision for safety for return to supine.    Transfers Overall transfer level: Needs assistance Equipment used: Rolling walker (2 wheeled) Transfers: Sit to/from Stand Sit to Stand: Supervision         General transfer comment: Supervision for safety.  Ambulation/Gait Ambulation/Gait assistance: Supervision;Min guard Gait Distance (Feet): 100 Feet Assistive device: Rolling walker (2 wheeled) Gait Pattern/deviations: Step-through pattern;Decreased weight shift to left;Antalgic Gait velocity: Decreased   General Gait Details: Mildly antalgic gait, but able to progress to step through pattern. Cues for sequencing using RW.  Stairs             Wheelchair Mobility    Modified Rankin (Stroke Patients Only)       Balance Overall balance assessment: Needs assistance Sitting-balance support: No upper extremity supported;Feet supported Sitting balance-Leahy Scale: Good     Standing balance support: No upper extremity supported;Bilateral upper extremity supported Standing balance-Leahy Scale: Fair Standing balance comment: Able to maintain static standing without UE support                             Pertinent Vitals/Pain Pain Assessment: 0-10 Pain Score: 3  Pain Location: L knee Pain Descriptors / Indicators: Aching;Operative site guarding Pain Intervention(s): Monitored during session;Limited activity within patient's tolerance;Repositioned    Home Living Family/patient expects to be discharged to:: Private residence Living Arrangements: Spouse/significant other Available Help at Discharge: Family;Available 24 hours/day Type of Home: House Home Access: Stairs to enter Entrance Stairs-Rails: Left Entrance Stairs-Number of Steps: 4-5 Home Layout: One level Home Equipment: Shower seat - built in;Walker - 2 wheels      Prior Function Level of Independence: Independent               Hand Dominance        Extremity/Trunk Assessment   Upper Extremity Assessment Upper Extremity Assessment: Overall WFL for tasks assessed    Lower Extremity Assessment Lower Extremity Assessment: LLE deficits/detail LLE Deficits / Details: Deficits consistent with post op pain and weakness.    Cervical / Trunk Assessment Cervical / Trunk Assessment: Normal  Communication   Communication: No difficulties  Cognition Arousal/Alertness: Awake/alert Behavior During Therapy: WFL for tasks assessed/performed Overall Cognitive Status: Within Functional Limits for  tasks assessed                                        General Comments General comments (skin integrity, edema, etc.): Pt's  husband present during session    Exercises Total Joint Exercises Ankle Circles/Pumps: AROM;Both;10 reps;Supine   Assessment/Plan    PT Assessment Patient needs continued PT services  PT Problem List Decreased strength;Decreased range of motion;Decreased balance;Decreased mobility;Pain       PT Treatment Interventions Gait training;DME instruction;Stair training;Functional mobility training;Therapeutic activities;Therapeutic exercise;Balance training;Patient/family education    PT Goals (Current goals can be found in the Care Plan section)  Acute Rehab PT Goals Patient Stated Goal: to go home PT Goal Formulation: With patient Time For Goal Achievement: 11/16/20 Potential to Achieve Goals: Good    Frequency 7X/week   Barriers to discharge        Co-evaluation               AM-PAC PT "6 Clicks" Mobility  Outcome Measure Help needed turning from your back to your side while in a flat bed without using bedrails?: None Help needed moving from lying on your back to sitting on the side of a flat bed without using bedrails?: None Help needed moving to and from a bed to a chair (including a wheelchair)?: A Little Help needed standing up from a chair using your arms (e.g., wheelchair or bedside chair)?: A Little Help needed to walk in hospital room?: A Little Help needed climbing 3-5 steps with a railing? : A Little 6 Click Score: 20    End of Session   Activity Tolerance: Patient tolerated treatment well Patient left: in bed;with call bell/phone within reach Nurse Communication: Mobility status PT Visit Diagnosis: Other abnormalities of gait and mobility (R26.89);Pain Pain - Right/Left: Left Pain - part of body: Knee    Time: 8366-2947 PT Time Calculation (min) (ACUTE ONLY): 16 min   Charges:   PT Evaluation $PT Eval Low Complexity: 1 Low          Lou Miner, DPT  Acute Rehabilitation Services  Pager: [phone] Office: [phone]   Rudean Hitt 11/02/2020, 2:01 PM

## 2020-11-02 NOTE — Transfer of Care (Signed)
Immediate Anesthesia Transfer of Care Note  Patient: Anna Mccarthy  Procedure(s) Performed: LEFT TOTAL KNEE ARTHROPLASTY (Left Knee)  Patient Location: PACU  Anesthesia Type:Spinal and MAC combined with regional for post-op pain  Level of Consciousness: awake, alert  and oriented  Airway & Oxygen Therapy: Patient Spontanous Breathing  Post-op Assessment: Report given to RN and Post -op Vital signs reviewed and stable  Post vital signs: Reviewed and stable  Last Vitals:  Vitals Value Taken Time  BP 99/71 11/02/20 0957  Temp    Pulse 73 11/02/20 0957  Resp 11 11/02/20 0957  SpO2 77 % 11/02/20 0957  Vitals shown include unvalidated device data.  Last Pain:  Vitals:   11/02/20 0642  TempSrc: Oral  PainSc:          Complications: No complications documented.

## 2020-11-02 NOTE — Anesthesia Procedure Notes (Signed)
Spinal  Patient location during procedure: OR Start time: 11/02/2020 7:46 AM End time: 11/02/2020 7:51 AM Reason for block: surgical anesthesia Staffing Performed: anesthesiologist  Anesthesiologist: Barnet Glasgow, MD Preanesthetic Checklist Completed: patient identified, IV checked, site marked, risks and benefits discussed, surgical consent, monitors and equipment checked, pre-op evaluation and timeout performed Spinal Block Patient position: sitting Prep: DuraPrep and site prepped and draped Patient monitoring: heart rate, cardiac monitor, continuous pulse ox and blood pressure Approach: midline Location: L3-4 Injection technique: single-shot Needle Needle type: Sprotte  Needle gauge: 24 G Needle length: 9 cm Needle insertion depth: 6 cm Assessment Sensory level: T4 Events: CSF return Additional Notes 1 Attempt (s). Pt tolerated procedure well.

## 2020-11-02 NOTE — Op Note (Signed)
Total Knee Arthroplasty Procedure Note  Preoperative diagnosis: Left knee osteoarthritis  Postoperative diagnosis:same  Operative procedure: Left total knee arthroplasty. CPT [phone]  Surgeon: N. Eduard Roux, MD  Assist: Madalyn Rob, PA-C; necessary for the timely completion of procedure and due to complexity of procedure.  Anesthesia: Spinal, regional, local  Tourniquet time: see anesthesia record  Implants used: Zimmer persona Femur: CR 7 narrow Tibia: D Patella: 32 mm Polyethylene: 18 mm, MC  Indication: Anna Mccarthy is a 65 y.o. year old female with a history of knee pain. Having failed conservative management, the patient elected to proceed with a total knee arthroplasty.  We have reviewed the risk and benefits of the surgery and they elected to proceed after voicing understanding.  Procedure:  After informed consent was obtained and understanding of the risk were voiced including but not limited to bleeding, infection, damage to surrounding structures including nerves and vessels, blood clots, leg length inequality and the failure to achieve desired results, the operative extremity was marked with verbal confirmation of the patient in the holding area.   The patient was then brought to the operating room and transported to the operating room table in the supine position.  A tourniquet was applied to the operative extremity around the upper thigh. The operative limb was then prepped and draped in the usual sterile fashion and preoperative antibiotics were administered.  A time out was performed prior to the start of surgery confirming the correct extremity, preoperative antibiotic administration, as well as team members, implants and instruments available for the case. Correct surgical site was also confirmed with preoperative radiographs. The limb was then elevated for exsanguination and the tourniquet was inflated. A midline incision was made and a standard medial  parapatellar approach was performed.  The infrapatellar fat pad was removed.  Suprapatellar synovium was removed to reveal the anterior distal femoral cortex.  A medial peel was performed to release the capsule of the medial tibial plateau.  The patella was then everted that showed grade 4 changes and was prepared and sized to a 32 mm.  A cover was placed on the patella for protection from retractors.  The knee was then brought into full flexion which showed severe degenerative wear and we then turned our attention to the femur.  The cruciates were sacrificed.  Start site was drilled in the femur and the intramedullary distal femoral cutting guide was placed, set at 5 degrees valgus, taking 10 mm of distal resection. The distal cut was made. Osteophytes were then removed.  Next, the proximal tibial cutting guide was placed with appropriate slope, varus/valgus alignment and depth of resection. The proximal tibial cut was made. Gap blocks were then used to assess the extension gap and alignment, and appropriate soft tissue releases were performed. Attention was turned back to the femur, which was sized using the sizing guide to a size 7. Appropriate rotation of the femoral component was determined using epicondylar axis, Whiteside's line, and assessing the flexion gap under ligament tension. The appropriate size 4-in-1 cutting block was placed and checked with an angel wing and cuts were made. Posterior femoral osteophytes and uncapped bone were then removed with the curved osteotome.  Trial components were placed, and stability was checked in full extension, mid-flexion, and deep flexion. Proper tibial rotation was determined and marked.  The patella tracked well without a lateral release.  The femoral lugs were then drilled. Trial components were then removed and tibial preparation performed.  The tibia  was sized for a size D component. The bony surfaces were irrigated with a pulse lavage and then dried. Bone  cement was vacuum mixed on the back table, and the final components sized above were cemented into place.  Antibiotic irrigation was placed in the knee joint and soft tissues while the cement cured.  After cement had finished curing, excess cement was removed. The stability of the construct was re-evaluated throughout a range of motion and found to be acceptable. The trial liner was removed, the knee was copiously irrigated, and the knee was re-evaluated for any excess bone debris. The real polyethylene liner, 18 mm thick, was inserted and checked to ensure the locking mechanism had engaged appropriately. The tourniquet was deflated and hemostasis was achieved. The wound was irrigated with normal saline.  One gram of vancomycin powder was placed in the surgical bed.  Capsular closure was performed with a #1 vicryl, subcutaneous fat closed with a 0 vicryl suture, then subcutaneous tissue closed with interrupted 2.0 vicryl suture. The skin was then closed with a 4.0 monocryl and steri strips. A sterile dressing was applied.  The patient was awakened in the operating room and taken to recovery in stable condition. All sponge, needle, and instrument counts were correct at the end of the case.  Tawanna Cooler was necessary for opening, closing, retracting, limb positioning and overall facilitation and completion of the surgery.  Position: supine  Complications: none.  Time Out: performed   Drains/Packing: none  Estimated blood loss: minimal  Returned to Recovery Room: in good condition.   Antibiotics: yes   Mechanical VTE (DVT) Prophylaxis: sequential compression devices, TED thigh-high  Chemical VTE (DVT) Prophylaxis: aspirin  Fluid Replacement  Crystalloid: see anesthesia record Blood: none  FFP: none   Specimens Removed: 1 to pathology   Sponge and Instrument Count Correct? yes   PACU: portable radiograph - knee AP and Lateral   Plan/RTC: Return in 2 weeks for wound check.   Weight  Bearing/Load Lower Extremity: full   Implant Name Type Inv. Item Serial No. Manufacturer Lot No. LRB No. Used Action  CEMENT BONE REFOBACIN [id number] [place] - [id number] Cement CEMENT BONE REFOBACIN [id number] [place]  ZIMMER RECON(ORTH,TRAU,BIO,SG) [id number] Left 2 Implanted  COMP FEM CEMT PERS SZ 7 LT - [id number] Joint COMP FEM CEMT PERS SZ 7 LT  ZIMMER RECON(ORTH,TRAU,BIO,SG) [id number] Left 1 Implanted  TIBIA STEM 5 DEG SZ D L KNEE - [id number] Knees TIBIA STEM 5 DEG SZ D L KNEE  ZIMMER RECON(ORTH,TRAU,BIO,SG) [id number] Left 1 Implanted  STEM POLY PAT PLY 10M KNEE - [id number] Knees STEM POLY PAT PLY 10M KNEE  ZIMMER RECON(ORTH,TRAU,BIO,SG) [id number] Left 1 Implanted  articular surface medial congruent left     ZIMMER KNEE [id number] Left 1 Implanted    N. Eduard Roux, MD [facility] 9:16 AM

## 2020-11-02 NOTE — Plan of Care (Signed)

## 2020-11-02 NOTE — Plan of Care (Signed)
  Problem: Activity: Goal: Risk for activity intolerance will decrease Outcome: Progressing   Problem: Pain Managment: Goal: General experience of comfort will improve Outcome: Progressing   Problem: Safety: Goal: Ability to remain free from injury will improve Outcome: Progressing   

## 2020-11-02 NOTE — Anesthesia Procedure Notes (Signed)
Anesthesia Regional Block: Adductor canal block   Pre-Anesthetic Checklist: ,, timeout performed, Correct Patient, Correct Site, Correct Laterality, Correct Procedure, Correct Position, site marked, Risks and benefits discussed,  Surgical consent,  Pre-op evaluation,  At surgeon's request and post-op pain management  Laterality: Lower and Left  Prep: chloraprep       Needles:  Injection technique: Single-shot  Needle Type: Echogenic Needle     Needle Length: 9cm  Needle Gauge: 22     Additional Needles:   Procedures:,,,, ultrasound used (permanent image in chart),,,,  Narrative:  Start time: 11/02/2020 7:22 AM End time: 11/02/2020 7:28 AM Injection made incrementally with aspirations every 5 mL.  Performed by: Personally  Anesthesiologist: Barnet Glasgow, MD  Additional Notes: Block assessed prior to surgery. Pt tolerated procedure well.

## 2020-11-02 NOTE — Anesthesia Postprocedure Evaluation (Signed)
Anesthesia Post Note  Patient: Anna Mccarthy  Procedure(s) Performed: LEFT TOTAL KNEE ARTHROPLASTY (Left Knee)     Patient location during evaluation: PACU Anesthesia Type: Spinal Level of consciousness: oriented and awake and alert Pain management: pain level controlled Vital Signs Assessment: post-procedure vital signs reviewed and stable Respiratory status: spontaneous breathing and respiratory function stable Cardiovascular status: blood pressure returned to baseline and stable Postop Assessment: no headache, no backache and no apparent nausea or vomiting Anesthetic complications: no   No complications documented.  Last Vitals:  Vitals:   11/02/20 1030 11/02/20 1045  BP: 121/84 135/78  Pulse: (!) 57 (!) 54  Resp: 13 12  Temp:    SpO2: 100% 100%    Last Pain:  Vitals:   11/02/20 1030  TempSrc:   PainSc: 0-No pain                 Barnet Glasgow

## 2020-11-03 ENCOUNTER — Encounter (HOSPITAL_COMMUNITY): Payer: Self-pay | Admitting: Orthopaedic Surgery

## 2020-11-03 DIAGNOSIS — M1712 Unilateral primary osteoarthritis, left knee: Secondary | ICD-10-CM | POA: Diagnosis not present

## 2020-11-03 LAB — CBC
HCT: 32.5 % — ABNORMAL LOW (ref 36.0–46.0)
Hemoglobin: 10.4 g/dL — ABNORMAL LOW (ref 12.0–15.0)
MCH: 28.4 pg (ref 26.0–34.0)
MCHC: 32 g/dL (ref 30.0–36.0)
MCV: 88.8 fL (ref 80.0–100.0)
Platelets: 312 10*3/uL (ref 150–400)
RBC: 3.66 MIL/uL — ABNORMAL LOW (ref 3.87–5.11)
RDW: 14.1 % (ref 11.5–15.5)
WBC: 5.3 10*3/uL (ref 4.0–10.5)
nRBC: 0 % (ref 0.0–0.2)

## 2020-11-03 LAB — BASIC METABOLIC PANEL
Anion gap: 8 (ref 5–15)
BUN: 16 mg/dL (ref 8–23)
CO2: 28 mmol/L (ref 22–32)
Calcium: 8.3 mg/dL — ABNORMAL LOW (ref 8.9–10.3)
Chloride: 101 mmol/L (ref 98–111)
Creatinine, Ser: 0.98 mg/dL (ref 0.44–1.00)
GFR, Estimated: >60 mL/min (ref >60)
Glucose, Bld: 98 mg/dL (ref 70–99)
Potassium: 4.1 mmol/L (ref 3.5–5.1)
Sodium: 137 mmol/L (ref 135–145)

## 2020-11-03 NOTE — Progress Notes (Signed)
Subjective: 1 Day Post-Op Procedure(s) (LRB): LEFT TOTAL KNEE ARTHROPLASTY (Left) Patient reports pain as mild.    Objective: Vital signs in last 24 hours: Temp:  [97.3 F (36.3 C)-98.7 F (37.1 C)] 98.2 F (36.8 C) (05/10 0750) Pulse Rate:  [51-78] 78 (05/10 0750) Resp:  [11-18] 14 (05/10 0750) BP: (99-159)/(65-85) 112/70 (05/10 0750) SpO2:  [96 %-100 %] 96 % (05/10 0750)  Intake/Output from previous day: 05/09 0701 - 05/10 0700 In: 2990.1 [I.V.:2790.1; IV Piggyback:200] Out: 450 [Urine:400; Blood:50] Intake/Output this shift: No intake/output data recorded.  Recent Labs    11/03/20 0120  HGB 10.4*   Recent Labs    11/03/20 0120  WBC 5.3  RBC 3.66*  HCT 32.5*  PLT 312   Recent Labs    11/03/20 0120  NA 137  K 4.1  CL 101  CO2 28  BUN 16  CREATININE 0.98  GLUCOSE 98  CALCIUM 8.3*   No results for input(s): LABPT, INR in the last 72 hours.  Neurologically intact Neurovascular intact Sensation intact distally Intact pulses distally Dorsiflexion/Plantar flexion intact Incision: dressing C/D/I No cellulitis present Compartment soft   Assessment/Plan: 1 Day Post-Op Procedure(s) (LRB): LEFT TOTAL KNEE ARTHROPLASTY (Left) Advance diet Up with therapy Discharge home with home health after second PT session WBAT LLE ABLA- mild and stable   Anticipated LOS equal to or greater than 2 midnights due to - Age 67 and older with one or more of the following:  - Obesity  - Expected need for hospital services (PT, OT, Nursing) required for safe  discharge  - Anticipated need for postoperative skilled nursing care or inpatient rehab  - Active co-morbidities: None OR   - Unanticipated findings during/Post Surgery: None  - Patient is a high risk of re-admission due to: None    Aundra Dubin 11/03/2020, 8:19 AM

## 2020-11-03 NOTE — Discharge Summary (Signed)
Patient ID: Anna Mccarthy MRN: [id number] DOB/AGE: 1955-12-01 65 y.o.  Admit date: 11/02/2020 Discharge date: 11/03/2020  Admission Diagnoses:  Principal Problem:   Primary osteoarthritis of left knee Active Problems:   Status post total left knee replacement   Discharge Diagnoses:  Same  Past Medical History:  Diagnosis Date  . Allergy   . Anxiety   . Arthritis   . Bronchitis   . Cancer ([place])    skin  . Fibromyalgia   . GERD (gastroesophageal reflux disease)   . Hypertension   . Osteoarthritis   . Pneumonia     Surgeries: Procedure(s): LEFT TOTAL KNEE ARTHROPLASTY on 11/02/2020   Consultants:   Discharged Condition: Improved  Hospital Course: Anna Mccarthy is an 65 y.o. female who was admitted 11/02/2020 for operative treatment ofPrimary osteoarthritis of left knee. Patient has severe unremitting pain that affects sleep, daily activities, and work/hobbies. After pre-op clearance the patient was taken to the operating room on 11/02/2020 and underwent  Procedure(s): LEFT TOTAL KNEE ARTHROPLASTY.    Patient was given perioperative antibiotics:  Anti-infectives (From admission, onward)   Start     Dose/Rate Route Frequency Ordered Stop   11/02/20 1400  ceFAZolin (ANCEF) IVPB 2g/100 mL premix        2 g 200 mL/hr over 30 Minutes Intravenous Every 6 hours 11/02/20 1156 11/02/20 2159   11/02/20 1245  hydroxychloroquine (PLAQUENIL) tablet 200 mg        200 mg Oral 2 times daily 11/02/20 1156     11/02/20 0806  vancomycin (VANCOCIN) powder  Status:  Discontinued          As needed 11/02/20 0806 11/02/20 0953   11/02/20 0600  ceFAZolin (ANCEF) IVPB 2g/100 mL premix        2 g 200 mL/hr over 30 Minutes Intravenous On call to O.R. 11/02/20 1025 11/02/20 0755       Patient was given sequential compression devices, early ambulation, and chemoprophylaxis to prevent DVT.  Patient benefited maximally from hospital stay and there were no complications.    Recent vital signs:   Patient Vitals for the past 24 hrs:  BP Temp Temp src Pulse Resp SpO2  11/03/20 0750 112/70 98.2 F (36.8 C) Oral 78 14 96 %  11/03/20 0537 122/65 98.7 F (37.1 C) Oral 71 18 98 %  11/02/20 2337 128/84 98 F (36.7 C) Oral 66 -- 97 %  11/02/20 2122 125/77 98 F (36.7 C) Oral 70 16 99 %  11/02/20 1532 (!) 156/83 97.7 F (36.5 C) Oral 63 16 100 %  11/02/20 1158 (!) 147/83 (!) 97.3 F (36.3 C) Oral (!) 53 17 100 %  11/02/20 1130 (!) 159/85 -- -- (!) 53 11 100 %  11/02/20 1100 (!) 147/84 (!) 97.5 F (36.4 C) -- (!) 51 11 100 %  11/02/20 1045 135/78 -- -- (!) 54 12 100 %  11/02/20 1030 121/84 -- -- (!) 57 13 100 %  11/02/20 1015 105/71 -- -- 64 12 100 %  11/02/20 1000 99/71 97.6 F (36.4 C) -- 71 14 97 %     Recent laboratory studies:  Recent Labs    11/03/20 0120  WBC 5.3  HGB 10.4*  HCT 32.5*  PLT 312  NA 137  K 4.1  CL 101  CO2 28  BUN 16  CREATININE 0.98  GLUCOSE 98  CALCIUM 8.3*     Discharge Medications:   Allergies as of 11/03/2020      Reactions  Percocet [oxycodone-acetaminophen] Itching   Sulfa Antibiotics    Gi problems      Medication List    TAKE these medications   ALPRAZolam 0.25 MG tablet Commonly known as: XANAX Take 0.25 mg by mouth 2 (two) times daily as needed for anxiety.   amoxicillin 500 MG capsule Commonly known as: AMOXIL Take 4 pills one hour prior to dental cleaning   aspirin EC 81 MG tablet Take 1 tablet (81 mg total) by mouth 2 (two) times daily. To be taken after surgery for dvt porphylaxis   diclofenac Sodium 1 % Gel Commonly known as: Voltaren Apply 2 g topically 4 (four) times daily.   docusate sodium 100 MG capsule Commonly known as: Colace Take 1 capsule (100 mg total) by mouth daily as needed.   DULoxetine 20 MG capsule Commonly known as: CYMBALTA Take 20 mg by mouth 2 (two) times daily.   escitalopram 20 MG tablet Commonly known as: LEXAPRO Take 20 mg by mouth at bedtime.   fluocinonide 0.05 % external  solution Commonly known as: LIDEX Apply 1 application topically 2 (two) times daily as needed (acne).   gabapentin 100 MG capsule Commonly known as: NEURONTIN Take 2 capsules (200 mg total) by mouth in the morning, at noon, and at bedtime.   hydrochlorothiazide 25 MG tablet Commonly known as: HYDRODIURIL Take 25 mg by mouth daily.   HYDROcodone-acetaminophen 7.5-325 MG tablet Commonly known as: Norco Take 1-2 tablets by mouth every 6 (six) hours as needed for moderate pain. To be taken after surgery   hydroxychloroquine 200 MG tablet Commonly known as: PLAQUENIL Take 200 mg by mouth 2 (two) times daily.   omeprazole 40 MG capsule Commonly known as: PRILOSEC Take 40 mg by mouth daily.   ondansetron 4 MG tablet Commonly known as: Zofran Take 1 tablet (4 mg total) by mouth every 8 (eight) hours as needed for nausea or vomiting. To be taken after surgery   tiZANidine 2 MG tablet Commonly known as: ZANAFLEX Take 1 tablet (2 mg total) by mouth every 8 (eight) hours as needed for muscle spasms. To be taken after surgery   Vitamin D (Ergocalciferol) 1.25 MG (50000 UNIT) Caps capsule Commonly known as: DRISDOL Take 50,000 Units by mouth every Sunday.            Durable Medical Equipment  (From admission, onward)         Start     Ordered   11/02/20 1157  DME Walker rolling  Once       Question:  Patient needs a walker to treat with the following condition  Answer:  Total knee replacement status   11/02/20 1156   11/02/20 1157  DME 3 n 1  Once        05 /09/22 1156   11/02/20 1157  DME Bedside commode  Once       Question:  Patient needs a bedside commode to treat with the following condition  Answer:  Total knee replacement status   11/02/20 1156          Diagnostic Studies: DG Chest 2 View  Result Date: 10/30/2020 CLINICAL DATA:  Preop total knee replacement EXAM: CHEST - 2 VIEW COMPARISON:  None. FINDINGS: The heart size and mediastinal contours are within normal  limits. Both lungs are clear. The visualized skeletal structures are unremarkable. IMPRESSION: No active cardiopulmonary disease. Electronically Signed   By: Kathreen Devoid   On: 10/30/2020 14:54   DG Knee Left Port  Result  Date: 11/02/2020 CLINICAL DATA:  65 year old female status post left total knee arthroplasty. EXAM: PORTABLE LEFT KNEE - 1-2 VIEW COMPARISON:  MRI knee 09/21/2017. FINDINGS: Portable AP and cross-table lateral views at 1004 hours. Left total knee arthroplasty hardware in place and aligned. Regional postoperative soft tissue changes. No unexpected osseous changes. No adverse features. IMPRESSION: Left total knee arthroplasty with no adverse features. Electronically Signed   By: Genevie Ann M.D.   On: 11/02/2020 11:03    Disposition: Discharge disposition: 01-Home or Self Care            Signed: Aundra Dubin 11/03/2020, 8:21 AM

## 2020-11-03 NOTE — Progress Notes (Signed)
Physical Therapy Treatment Patient Details Name: Anna Mccarthy MRN: [id number] DOB: 06/17/1956 Today's Date: 11/03/2020    History of Present Illness Pt is a 65 y/o female s/p L TKA on 5/9. PMH includes R TKA, fibromyalgia, HTN, GERD, and skin CA.    PT Comments    Pt progressing very well towards all goals. Pt safely completed stair negotiation. Pt also with increased L active knee flexion in sitting and increased ambulation tolerance. Acute PT to cont to follow to progress independence.  Follow Up Recommendations  Follow surgeon's recommendation for DC plan and follow-up therapies     Equipment Recommendations  None recommended by PT    Recommendations for Other Services       Precautions / Restrictions Precautions Precautions: Knee Precaution Booklet Issued: Yes (comment) Precaution Comments: went over precautions and HEP Restrictions Weight Bearing Restrictions: Yes LLE Weight Bearing: Weight bearing as tolerated    Mobility  Bed Mobility Overal bed mobility: Needs Assistance Bed Mobility: Supine to Sit     Supine to sit: Supervision     General bed mobility comments: pt able to get up into long sit with verbal cues and bring LEs off EOB without physical assist    Transfers Overall transfer level: Needs assistance Equipment used: Rolling walker (2 wheeled) Transfers: Sit to/from Stand Sit to Stand: Min guard         General transfer comment: pt with significant trunk flexion today requiring min guard to bring her trunk up, when asked why she bent forward to much she said "I have no idea" when getting up off toliet pt with minimal trunk flexion and better transfer and safety  Ambulation/Gait Ambulation/Gait assistance: Supervision Gait Distance (Feet): 200 Feet Assistive device: Rolling walker (2 wheeled) Gait Pattern/deviations: Step-through pattern;Decreased weight shift to left;Antalgic Gait velocity: dec Gait velocity interpretation: 1.31 - 2.62  ft/sec, indicative of limited community ambulator General Gait Details: mildly antaligic, verbal cues to push L knee into extension when steping through with R LE   Stairs Stairs: Yes Stairs assistance: Min guard Stair Management: One rail Left;Sideways;Step to pattern Number of Stairs: 4 General stair comments: pt with good return demonstration, no physical assist needed   Wheelchair Mobility    Modified Rankin (Stroke Patients Only)       Balance Overall balance assessment: Needs assistance Sitting-balance support: No upper extremity supported;Feet supported Sitting balance-Leahy Scale: Good     Standing balance support: No upper extremity supported;Bilateral upper extremity supported Standing balance-Leahy Scale: Fair Standing balance comment: able to wash hands at sink without UE support without instability                            Cognition Arousal/Alertness: Awake/alert Behavior During Therapy: WFL for tasks assessed/performed Overall Cognitive Status: Within Functional Limits for tasks assessed                                 General Comments: pt appears to be delayed in response time however A&O x4      Exercises Total Joint Exercises Ankle Circles/Pumps: AROM;Both;10 reps;Supine Quad Sets: AROM;Right;10 reps;Supine (with 5 sec hold) Heel Slides: AROM;Left;10 reps;Seated (using towel to slide on floor and R heel to assist L knee into further flexion) Straight Leg Raises: AROM;Left;10 reps;Supine Long Arc Quad: AROM;Left;10 reps;Seated Goniometric ROM: -6-89    General Comments General comments (skin integrity, edema, etc.): incision  covered by dressing      Pertinent Vitals/Pain Pain Assessment: 0-10 Pain Score: 4  Pain Location: L knee Pain Descriptors / Indicators: Aching;Operative site guarding Pain Intervention(s): Monitored during session    Home Living                      Prior Function            PT  Goals (current goals can now be found in the care plan section) Acute Rehab PT Goals Patient Stated Goal: go home Progress towards PT goals: Progressing toward goals    Frequency    7X/week      PT Plan Current plan remains appropriate    Co-evaluation              AM-PAC PT "6 Clicks" Mobility   Outcome Measure  Help needed turning from your back to your side while in a flat bed without using bedrails?: None Help needed moving from lying on your back to sitting on the side of a flat bed without using bedrails?: None Help needed moving to and from a bed to a chair (including a wheelchair)?: None Help needed standing up from a chair using your arms (e.g., wheelchair or bedside chair)?: None Help needed to walk in hospital room?: None Help needed climbing 3-5 steps with a railing? : A Little 6 Click Score: 23    End of Session Equipment Utilized During Treatment: Gait belt Activity Tolerance: Patient tolerated treatment well Patient left: in chair;with call bell/phone within reach Nurse Communication: Mobility status PT Visit Diagnosis: Other abnormalities of gait and mobility (R26.89);Pain Pain - Right/Left: Left Pain - part of body: Knee     Time: 9147-8295 PT Time Calculation (min) (ACUTE ONLY): 27 min  Charges:  $Gait Training: 8-22 mins $Therapeutic Exercise: 8-22 mins                     Kittie Plater, PT, DPT Acute Rehabilitation Services Pager #: [phone] Office #: [phone]    Berline Lopes 11/03/2020, 10:57 AM

## 2020-11-03 NOTE — Progress Notes (Signed)
Patient set to discharge home with transportation provided by spouse.  Patient received AVS discharge paperwork including education regarding any changes to medications, side effects, and follow up appointments with no questions/concerns voiced by the patient at this time.  PIV removed prior to discharge with patient voicing she had all necessary DME equipment at home and did not need those orders fulfilled.  Patient also stated she has an iceman machine at home and would not require an additional one to go home with.  Patient transported off the unit via wheelchair by volunteer.

## 2020-11-03 NOTE — Discharge Instructions (Signed)

## 2020-11-10 ENCOUNTER — Telehealth: Payer: Self-pay | Admitting: Orthopaedic Surgery

## 2020-11-10 NOTE — Telephone Encounter (Signed)
Patient called. She would like a referral for PT to start on 5/24 here at [facility].

## 2020-11-11 ENCOUNTER — Other Ambulatory Visit: Payer: Self-pay

## 2020-11-11 DIAGNOSIS — Z96652 Presence of left artificial knee joint: Secondary | ICD-10-CM

## 2020-11-11 NOTE — Telephone Encounter (Signed)
Referral sent 

## 2020-11-17 ENCOUNTER — Encounter: Payer: Self-pay | Admitting: Physical Therapy

## 2020-11-17 ENCOUNTER — Ambulatory Visit (INDEPENDENT_AMBULATORY_CARE_PROVIDER_SITE_OTHER): Payer: Medicare Other | Admitting: Orthopaedic Surgery

## 2020-11-17 ENCOUNTER — Ambulatory Visit (INDEPENDENT_AMBULATORY_CARE_PROVIDER_SITE_OTHER): Payer: Medicare Other | Admitting: Physical Therapy

## 2020-11-17 ENCOUNTER — Other Ambulatory Visit: Payer: Self-pay

## 2020-11-17 VITALS — Ht 63.0 in | Wt 175.0 lb

## 2020-11-17 DIAGNOSIS — R600 Localized edema: Secondary | ICD-10-CM

## 2020-11-17 DIAGNOSIS — M25661 Stiffness of right knee, not elsewhere classified: Secondary | ICD-10-CM

## 2020-11-17 DIAGNOSIS — M6281 Muscle weakness (generalized): Secondary | ICD-10-CM | POA: Diagnosis not present

## 2020-11-17 DIAGNOSIS — Z96652 Presence of left artificial knee joint: Secondary | ICD-10-CM

## 2020-11-17 DIAGNOSIS — M25562 Pain in left knee: Secondary | ICD-10-CM

## 2020-11-17 DIAGNOSIS — R2681 Unsteadiness on feet: Secondary | ICD-10-CM

## 2020-11-17 DIAGNOSIS — M25561 Pain in right knee: Secondary | ICD-10-CM

## 2020-11-17 DIAGNOSIS — R262 Difficulty in walking, not elsewhere classified: Secondary | ICD-10-CM

## 2020-11-17 MED ORDER — HYDROCODONE-ACETAMINOPHEN 7.5-325 MG PO TABS: 1.0000 | ORAL_TABLET | Freq: Two times a day (BID) | ORAL | 0 refills | Status: DC | PRN

## 2020-11-17 NOTE — Therapy (Signed)
[facility] Physical Therapy [address] [place], [place], [address] Phone: [phone]   Fax:  [phone]  Physical Therapy Evaluation  Patient Details  Name: Anna Mccarthy MRN: [id number] Date of Birth: 1956-04-23 Referring Provider (PT): Frankey Shown, MD   Encounter Date: 11/17/2020   PT End of Session - 11/17/20 1345    Visit Number 1    Number of Visits 16    Date for PT Re-Evaluation 02/12/21    Authorization Type Medicare A&B and AARP    Progress Note Due on Visit 10    PT Start Time 1345    PT Stop Time 1430    PT Time Calculation (min) 45 min    Activity Tolerance Patient tolerated treatment well    Behavior During Therapy [facility] for tasks assessed/performed           Past Medical History:  Diagnosis Date  . Allergy   . Anxiety   . Arthritis   . Bronchitis   . Cancer ([place])    skin  . Fibromyalgia   . GERD (gastroesophageal reflux disease)   . Hypertension   . Osteoarthritis   . Pneumonia     Past Surgical History:  Procedure Laterality Date  . ABDOMINAL HYSTERECTOMY  1996  . BREAST BIOPSY Left 2011   stereo done by dr. Bary Castilla -benign  . COLONOSCOPY    . COLONOSCOPY WITH PROPOFOL N/A 05/30/2016   Procedure: COLONOSCOPY WITH PROPOFOL;  Surgeon: Lollie Sails, MD;  Location: [facility] ENDOSCOPY;  Service: Endoscopy;  Laterality: N/A;  . DILATION AND CURETTAGE OF UTERUS     x3  . HERNIA REPAIR  2012  . JOINT REPLACEMENT    . KNEE ARTHROSCOPY WITH MEDIAL MENISECTOMY Left 02/18/2016   Procedure: KNEE ARTHROSCOPY WITH PARTIAL MEDIAL MENISECTOMY;  Surgeon: Hessie Knows, MD;  Location: [facility] ORS;  Service: Orthopedics;  Laterality: Left;  . TOTAL KNEE ARTHROPLASTY Right 08/10/2020   Procedure: RIGHT TOTAL KNEE ARTHROPLASTY;  Surgeon: Leandrew Koyanagi, MD;  Location: [place];  Service: Orthopedics;  Laterality: Right;  . TOTAL KNEE ARTHROPLASTY Left 11/02/2020   Procedure: LEFT TOTAL KNEE ARTHROPLASTY;  Surgeon: Leandrew Koyanagi, MD;  Location: [place];   Service: Orthopedics;  Laterality: Left;  . TUBAL LIGATION  1984  . VARICOSE VEIN SURGERY Bilateral     There were no vitals filed for this visit.    Subjective Assessment - 11/17/20 1342    Subjective Pt arriving to theapy s/p left TKA on 11/02/2020. Pt reporting 6/10 pain today after walking in deparment store. Pt amb with no assistive device. Pt reporting 6 HHPT visits.    Pertinent History anxiety, arthritis, skin CA, fibromylagia, HTN,    Diagnostic tests X-ray    Patient Stated Goals improve strength and walking, stop hurting    Currently in Pain? Yes    Pain Score 6     Pain Location Knee    Pain Orientation Left    Pain Descriptors / Indicators Aching;Sore    Pain Type Surgical pain;Acute pain    Pain Onset 1 to 4 weeks ago    Pain Frequency Constant    Aggravating Factors  bending, walking    Pain Relieving Factors ice, over the counter pain meds    Effect of Pain on Daily Activities difficluty walking longer distances, difficulty sleeping, unable to bend down              [facility] PT Assessment - 11/17/20 0001      Assessment  Medical Diagnosis left knee arthroplasty    Referring Provider (PT) Frankey Shown, MD    Onset Date/Surgical Date 11/02/20    Hand Dominance Right    Prior Therapy yes for right TKA in 08/10/2020 and HHPT 6 visits prior this visit      Precautions   Precautions None      Restrictions   Weight Bearing Restrictions No      Balance Screen   Has the patient fallen in the past 6 months No    Is the patient reluctant to leave their home because of a fear of falling?  No      Home Environment   Living Environment Private residence    Living Arrangements Spouse/significant other    Type of [place] to enter    Entrance Stairs-Number of Steps 3      Prior Function   Level of [place] Retired    Leisure travel, walking      Cognition   Overall Cognitive Status Within Functional Limits for  tasks assessed      Observation/Other Assessments   Focus on Therapeutic Outcomes (FOTO)  47 (predicted 60)      Circumferential Edema   Circumferential - Right 41 centimeters    Circumferential - Left  44 centimeter      Posture/Postural Control   Posture/Postural Control Postural limitations    Postural Limitations Rounded Shoulders;Forward head;Decreased lumbar lordosis      ROM / Strength   AROM / PROM / Strength AROM;PROM;Strength      AROM   Right Knee Extension 0    Right Knee Flexion 125    Left Knee Extension 8    Left Knee Flexion 80      PROM   Right/Left Knee Left    Left Knee Extension 5    Left Knee Flexion 90      Strength   Strength Assessment Site Knee    Right/Left Knee Left    Right Knee Flexion 5/5    Right Knee Extension 5/5    Left Knee Flexion 4-/5    Left Knee Extension 4-/5      Palpation   Palpation comment TTP, anterior joint line      Transfers   Five time sit to stand comments  13 seconds with bilateral UE support      Ambulation/Gait   Gait Pattern Step-through pattern;Decreased stance time - left;Decreased hip/knee flexion - left                      Objective measurements completed on examination: See above findings.               PT Education - 11/17/20 1343    Education Details PT POC, HEP    Person(s) Educated Patient    Methods Explanation;Demonstration;Handout;Verbal cues;Tactile cues    Comprehension Returned demonstration;Verbalized understanding            PT Short Term Goals - 11/17/20 1352      PT SHORT TERM GOAL #1   Title Patient demonstrates understanding of HEP for first 4 weeks.    Time 4    Period Weeks    Status New    Target Date 12/18/20      PT SHORT TERM GOAL #2   Title left knee PROM 0-100 degrees    Baseline 5-90 degrees    Time 4    Period Weeks  Status New    Target Date 12/18/20      PT SHORT TERM GOAL #3   Title -             PT Long Term Goals -  11/17/20 1355      PT LONG TERM GOAL #1   Title Pt will increase FOTO score to 60.    Baseline 47 on 11/17/2020    Time 8    Period Weeks    Status New    Target Date 01/15/21      PT LONG TERM GOAL #2   Title Pt will report pain </= 2/10 in left knee with ADL's.    Baseline Pt reporting 6/10 pain at times    Time 8    Period Weeks    Status New    Target Date 01/15/21      PT LONG TERM GOAL #3   Title Pt will increased left knee AROM to >/= 0-110 degrees    Time 8    Period Weeks    Status New    Target Date 01/15/21      PT LONG TERM GOAL #4   Title Pt will incrased her left knee strength to 5/5.    Baseline -    Time 8    Period Weeks    Status New    Target Date 01/15/21      PT LONG TERM GOAL #5   Title Patient ambulates >500' and negotiates ramps & curbs independently.    Baseline -    Time 8    Period Weeks    Status New    Target Date 01/15/21                  Plan - 11/17/20 1411    Clinical Impression Statement Pt s/p left TKA on 11/02/2020. Pt reports having a CPM at home, but feels like her past PT exercises work better. Pt has been continuing her HEP from her right TKA. Pt reports getting her CPM up to 75 degrees. Pt's AROM today 8- 80 degrees and passive ROM 5-90 degrees. Pt with weaknes noted in left LE knee flexion/extension of 4-/5. Skilled PT needed to progress pt toward her PLOF and maximize function.    Personal Factors and Comorbidities Comorbidity 3+    Comorbidities anxiety, arthritis, skin CA, fibromylagia, HTN    Examination-Activity Limitations Lift;Locomotion Level;Squat;Stairs;Stand;Transfers    Examination-Participation Restrictions Community Activity;Driving;Yard Work;Other;Shop    Stability/Clinical Decision Making Stable/Uncomplicated    Clinical Decision Making Low    Rehab Potential Good    PT Frequency 2x / week    PT Duration 8 weeks    PT Treatment/Interventions ADLs/Self Care Home Management;Electrical  Stimulation;Cryotherapy;DME Instruction;Gait training;Stair training;Functional mobility training;Therapeutic activities;Therapeutic exercise;Balance training;Neuromuscular re-education;Patient/family education;Manual techniques;Scar mobilization;Passive range of motion;Dry needling;Taping;Vasopneumatic Device;Vestibular;Joint Manipulations    PT Next Visit Plan knee ROM, strengthening, balance, vasopneumatic    PT Home Exercise Plan Access Code: [email or web address] (continue previous HEP)    Consulted and Agree with Plan of Care Patient           Patient will benefit from skilled therapeutic intervention in order to improve the following deficits and impairments:  Abnormal gait,Decreased activity tolerance,Decreased balance,Decreased mobility,Decreased range of motion,Decreased scar mobility,Decreased skin integrity,Decreased strength,Increased edema,Impaired flexibility,Pain  Visit Diagnosis: Acute pain of right knee  Stiffness of right knee, not elsewhere classified  Muscle weakness (generalized)  Localized edema  Difficulty in walking, not elsewhere classified  Unsteadiness on feet  Problem List Patient Active Problem List   Diagnosis Date Noted  . Primary osteoarthritis of left knee 11/02/2020  . Status post left knee replacement 11/02/2020  . Status post total right knee replacement 08/10/2020  . Primary osteoarthritis of right knee 01/30/2020  . Chronic pain of left knee 09/11/2018  . Meniscus degeneration, left 09/11/2018  . Fibromyalgia 09/11/2018  . Chronic pain syndrome 09/11/2018  . Venous ulcer ([place]) 02/02/2017  . Varicose veins of bilateral lower extremities with pain 05/16/2016  . Chronic venous insufficiency 05/16/2016  . Pain in limb 05/16/2016  . Swelling of limb 05/16/2016  . DJD (degenerative joint disease) 05/16/2016    Oretha Caprice, PT, MPT 11/17/2020, 2:33 PM  [facility] Physical Therapy [address] [place], [place],  [address] Phone: [phone]   Fax:  [phone]  Name: Anna Mccarthy MRN: [id number] Date of Birth: 1956-06-19

## 2020-11-17 NOTE — Progress Notes (Signed)
Post-Op Visit Note   Patient: Anna Mccarthy           Date of Birth: [birth year]           MRN: [id number] Visit Date: 11/17/2020 PCP: [place] Pink, MD   Assessment & Plan:  Chief Complaint:  Chief Complaint  Patient presents with  . Left Knee - Follow-up    Left total knee arthroplasty 11/02/2020   Visit Diagnoses:  1. Status post left knee replacement     Plan:   Windie is 2 weeks status post left total knee replacement.  She has finished home health PT.  She feels like the CPM is not that helpful and the bands are more helpful.  She has her first outpatient PT session scheduled for our office today.  No real complaints otherwise.  She is ambulating without any devices.  Surgical incision is healed without any signs of infection.  Mild swelling.  No calf tenderness.  Range of motion is 1-90.  Stable to varus valgus.  Anny is progressing and recovering well.  Instructions and precautions were again reinforced.  We will see her back in 4 weeks with two-view x-rays of the left knee.  Follow-Up Instructions: Return in about 4 weeks (around 12/15/2020).   Orders:  No orders of the defined types were placed in this encounter.  Meds ordered this encounter  Medications  . HYDROcodone-acetaminophen (NORCO) 7.5-325 MG tablet    Sig: Take 1-2 tablets by mouth 2 (two) times daily as needed for moderate pain. To be taken after surgery    Dispense:  30 tablet    Refill:  0    Imaging: No results found.  PMFS History: Patient Active Problem List   Diagnosis Date Noted  . Primary osteoarthritis of left knee 11/02/2020  . Status post left knee replacement 11/02/2020  . Status post total right knee replacement 08/10/2020  . Primary osteoarthritis of right knee 01/30/2020  . Chronic pain of left knee 09/11/2018  . Meniscus degeneration, left 09/11/2018  . Fibromyalgia 09/11/2018  . Chronic pain syndrome 09/11/2018  . Venous ulcer ([place]) 02/02/2017  . Varicose veins of  bilateral lower extremities with pain 05/16/2016  . Chronic venous insufficiency 05/16/2016  . Pain in limb 05/16/2016  . Swelling of limb 05/16/2016  . DJD (degenerative joint disease) 05/16/2016   Past Medical History:  Diagnosis Date  . Allergy   . Anxiety   . Arthritis   . Bronchitis   . Cancer ([place])    skin  . Fibromyalgia   . GERD (gastroesophageal reflux disease)   . Hypertension   . Osteoarthritis   . Pneumonia     Family History  Problem Relation Age of Onset  . Breast cancer Maternal Aunt   . Ovarian cancer Sister   . Breast cancer Cousin     Past Surgical History:  Procedure Laterality Date  . ABDOMINAL HYSTERECTOMY  1996  . BREAST BIOPSY Left 2011   stereo done by dr. Bary Castilla -benign  . COLONOSCOPY    . COLONOSCOPY WITH PROPOFOL N/A 05/30/2016   Procedure: COLONOSCOPY WITH PROPOFOL;  Surgeon: Lollie Sails, MD;  Location: [facility] ENDOSCOPY;  Service: Endoscopy;  Laterality: N/A;  . DILATION AND CURETTAGE OF UTERUS     x3  . HERNIA REPAIR  2012  . JOINT REPLACEMENT    . KNEE ARTHROSCOPY WITH MEDIAL MENISECTOMY Left 02/18/2016   Procedure: KNEE ARTHROSCOPY WITH PARTIAL MEDIAL MENISECTOMY;  Surgeon: Hessie Knows, MD;  Location: [facility] ORS;  Service: Orthopedics;  Laterality: Left;  . TOTAL KNEE ARTHROPLASTY Right 08/10/2020   Procedure: RIGHT TOTAL KNEE ARTHROPLASTY;  Surgeon: Leandrew Koyanagi, MD;  Location: [place];  Service: Orthopedics;  Laterality: Right;  . TOTAL KNEE ARTHROPLASTY Left 11/02/2020   Procedure: LEFT TOTAL KNEE ARTHROPLASTY;  Surgeon: Leandrew Koyanagi, MD;  Location: [place];  Service: Orthopedics;  Laterality: Left;  . TUBAL LIGATION  1984  . VARICOSE VEIN SURGERY Bilateral    Social History   Occupational History  . Not on file  Tobacco Use  . Smoking status: Never Smoker  . Smokeless tobacco: Never Used  Vaping Use  . Vaping Use: Never used  Substance and Sexual Activity  . Alcohol use: Yes    Comment: once per month  . Drug use: No  .  Sexual activity: Not on file

## 2020-11-18 ENCOUNTER — Ambulatory Visit: Payer: Medicare Other | Admitting: Physical Therapy

## 2020-11-19 ENCOUNTER — Ambulatory Visit (INDEPENDENT_AMBULATORY_CARE_PROVIDER_SITE_OTHER): Payer: Medicare Other | Admitting: Physical Therapy

## 2020-11-19 ENCOUNTER — Other Ambulatory Visit: Payer: Self-pay

## 2020-11-19 DIAGNOSIS — R600 Localized edema: Secondary | ICD-10-CM | POA: Diagnosis not present

## 2020-11-19 DIAGNOSIS — M25562 Pain in left knee: Secondary | ICD-10-CM

## 2020-11-19 DIAGNOSIS — M25661 Stiffness of right knee, not elsewhere classified: Secondary | ICD-10-CM

## 2020-11-19 DIAGNOSIS — M6281 Muscle weakness (generalized): Secondary | ICD-10-CM | POA: Diagnosis not present

## 2020-11-19 DIAGNOSIS — R2681 Unsteadiness on feet: Secondary | ICD-10-CM

## 2020-11-19 DIAGNOSIS — M25561 Pain in right knee: Secondary | ICD-10-CM

## 2020-11-19 DIAGNOSIS — R262 Difficulty in walking, not elsewhere classified: Secondary | ICD-10-CM

## 2020-11-19 NOTE — Therapy (Signed)
[facility] Physical Therapy [address] [place], [place], [address] Phone: [phone]   Fax:  [phone]  Physical Therapy Treatment  Patient Details  Name: Anna Mccarthy MRN: [id number] Date of Birth: 01-22-1956 Referring Provider (PT): Frankey Shown, MD   Encounter Date: 11/19/2020   PT End of Session - 11/19/20 1439    Visit Number 2    Number of Visits 16    Date for PT Re-Evaluation 02/12/21    Authorization Type Medicare A&B and AARP    Progress Note Due on Visit 10    PT Start Time 1345    PT Stop Time 1430    PT Time Calculation (min) 45 min    Activity Tolerance Patient tolerated treatment well    Behavior During Therapy [facility] for tasks assessed/performed           Past Medical History:  Diagnosis Date  . Allergy   . Anxiety   . Arthritis   . Bronchitis   . Cancer ([place])    skin  . Fibromyalgia   . GERD (gastroesophageal reflux disease)   . Hypertension   . Osteoarthritis   . Pneumonia     Past Surgical History:  Procedure Laterality Date  . ABDOMINAL HYSTERECTOMY  1996  . BREAST BIOPSY Left 2011   stereo done by dr. Bary Castilla -benign  . COLONOSCOPY    . COLONOSCOPY WITH PROPOFOL N/A 05/30/2016   Procedure: COLONOSCOPY WITH PROPOFOL;  Surgeon: Lollie Sails, MD;  Location: [facility] ENDOSCOPY;  Service: Endoscopy;  Laterality: N/A;  . DILATION AND CURETTAGE OF UTERUS     x3  . HERNIA REPAIR  2012  . JOINT REPLACEMENT    . KNEE ARTHROSCOPY WITH MEDIAL MENISECTOMY Left 02/18/2016   Procedure: KNEE ARTHROSCOPY WITH PARTIAL MEDIAL MENISECTOMY;  Surgeon: Hessie Knows, MD;  Location: [facility] ORS;  Service: Orthopedics;  Laterality: Left;  . TOTAL KNEE ARTHROPLASTY Right 08/10/2020   Procedure: RIGHT TOTAL KNEE ARTHROPLASTY;  Surgeon: Leandrew Koyanagi, MD;  Location: [place];  Service: Orthopedics;  Laterality: Right;  . TOTAL KNEE ARTHROPLASTY Left 11/02/2020   Procedure: LEFT TOTAL KNEE ARTHROPLASTY;  Surgeon: Leandrew Koyanagi, MD;  Location: [place];   Service: Orthopedics;  Laterality: Left;  . TUBAL LIGATION  1984  . VARICOSE VEIN SURGERY Bilateral     There were no vitals filed for this visit.   Subjective Assessment - 11/19/20 1407    Subjective 5/10 overall Lt knee pain, she admits she may have overdone it with shopping and yardwork so her knee is painful, stiff, and swollen today.    Pertinent History anxiety, arthritis, skin CA, fibromylagia, HTN,    Diagnostic tests X-ray    Patient Stated Goals improve strength and walking, stop hurting    Pain Onset 1 to 4 weeks ago             [facility] Adult PT Treatment/Exercise - 11/19/20 0001      Exercises   Exercises Knee/Hip      Knee/Hip Exercises: Stretches   Active Hamstring Stretch Left;3 reps;30 seconds    Knee: Self-Stretch Limitations standing lunge stretch 10 sec X10, seated tailgate stretch 10 sec X10    Gastroc Stretch Both;3 reps;30 seconds    Gastroc Stretch Limitations slantboard      Knee/Hip Exercises: Aerobic   Nustep L5X10 min UE/LE      Knee/Hip Exercises: Machines for Strengthening   Cybex Leg Press shuttle leg press  Lt leg 25# 4X10  Vasopneumatic   Number Minutes Vasopneumatic  10 minutes    Vasopnuematic Location  Knee    Vasopneumatic Pressure Medium    Vasopneumatic Temperature  34      Manual Therapy   Manual therapy comments Lt knee PROM to tolerance and flexion mobs                    PT Short Term Goals - 11/17/20 1352      PT SHORT TERM GOAL #1   Title Patient demonstrates understanding of HEP for first 4 weeks.    Time 4    Period Weeks    Status New    Target Date 12/18/20      PT SHORT TERM GOAL #2   Title left knee PROM 0-100 degrees    Baseline 5-90 degrees    Time 4    Period Weeks    Status New    Target Date 12/18/20      PT SHORT TERM GOAL #3   Title -             PT Long Term Goals - 11/17/20 1355      PT LONG TERM GOAL #1   Title Pt will increase FOTO score to 60.    Baseline 47 on  11/17/2020    Time 8    Period Weeks    Status New    Target Date 01/15/21      PT LONG TERM GOAL #2   Title Pt will report pain </= 2/10 in left knee with ADL's.    Baseline Pt reporting 6/10 pain at times    Time 8    Period Weeks    Status New    Target Date 01/15/21      PT LONG TERM GOAL #3   Title Pt will increased left knee AROM to >/= 0-110 degrees    Time 8    Period Weeks    Status New    Target Date 01/15/21      PT LONG TERM GOAL #4   Title Pt will incrased her left knee strength to 5/5.    Baseline -    Time 8    Period Weeks    Status New    Target Date 01/15/21      PT LONG TERM GOAL #5   Title Patient ambulates >500' and negotiates ramps & curbs independently.    Baseline -    Time 8    Period Weeks    Status New    Target Date 01/15/21                 Plan - 11/19/20 1440    Clinical Impression Statement Focused on Lt knee ROM today and light strenghtening to her tolerance. We will attempt to progress this as able to maximize her function. She did have more edema noted in her Lt knee so used vasopnuematic to reduce this.    Personal Factors and Comorbidities Comorbidity 3+    Comorbidities anxiety, arthritis, skin CA, fibromylagia, HTN    Examination-Activity Limitations Lift;Locomotion Level;Squat;Stairs;Stand;Transfers    Examination-Participation Restrictions Community Activity;Driving;Yard Work;Other;Shop    Stability/Clinical Decision Making Stable/Uncomplicated    Rehab Potential Good    PT Frequency 2x / week    PT Duration 8 weeks    PT Treatment/Interventions ADLs/Self Care Home Management;Electrical Stimulation;Cryotherapy;DME Instruction;Gait training;Stair training;Functional mobility training;Therapeutic activities;Therapeutic exercise;Balance training;Neuromuscular re-education;Patient/family education;Manual techniques;Scar mobilization;Passive range of motion;Dry needling;Taping;Vasopneumatic Device;Vestibular;Joint Manipulations  PT Next Visit Plan knee ROM, strengthening, balance, vasopneumatic    PT Home Exercise Plan Access Code: [email or web address] (continue previous HEP)    Consulted and Agree with Plan of Care Patient           Patient will benefit from skilled therapeutic intervention in order to improve the following deficits and impairments:  Abnormal gait,Decreased activity tolerance,Decreased balance,Decreased mobility,Decreased range of motion,Decreased scar mobility,Decreased skin integrity,Decreased strength,Increased edema,Impaired flexibility,Pain  Visit Diagnosis: Acute pain of right knee  Stiffness of right knee, not elsewhere classified  Muscle weakness (generalized)  Localized edema     Problem List Patient Active Problem List   Diagnosis Date Noted  . Primary osteoarthritis of left knee 11/02/2020  . Status post left knee replacement 11/02/2020  . Status post total right knee replacement 08/10/2020  . Primary osteoarthritis of right knee 01/30/2020  . Chronic pain of left knee 09/11/2018  . Meniscus degeneration, left 09/11/2018  . Fibromyalgia 09/11/2018  . Chronic pain syndrome 09/11/2018  . Venous ulcer ([place]) 02/02/2017  . Varicose veins of bilateral lower extremities with pain 05/16/2016  . Chronic venous insufficiency 05/16/2016  . Pain in limb 05/16/2016  . Swelling of limb 05/16/2016  . DJD (degenerative joint disease) 05/16/2016    Silvestre Mesi 11/19/2020, 2:42 PM  [facility] Physical Therapy [address] [place], [place], [address] Phone: [phone]   Fax:  [phone]  Name: MAELYN BERREY MRN: [id number] Date of Birth: 06/03/1956

## 2020-11-24 ENCOUNTER — Other Ambulatory Visit: Payer: Self-pay

## 2020-11-24 ENCOUNTER — Ambulatory Visit (INDEPENDENT_AMBULATORY_CARE_PROVIDER_SITE_OTHER): Payer: Medicare Other | Admitting: Physical Therapy

## 2020-11-24 DIAGNOSIS — R262 Difficulty in walking, not elsewhere classified: Secondary | ICD-10-CM

## 2020-11-24 DIAGNOSIS — R2681 Unsteadiness on feet: Secondary | ICD-10-CM

## 2020-11-24 DIAGNOSIS — R600 Localized edema: Secondary | ICD-10-CM | POA: Diagnosis not present

## 2020-11-24 DIAGNOSIS — M6281 Muscle weakness (generalized): Secondary | ICD-10-CM | POA: Diagnosis not present

## 2020-11-24 DIAGNOSIS — M25561 Pain in right knee: Secondary | ICD-10-CM

## 2020-11-24 DIAGNOSIS — M25661 Stiffness of right knee, not elsewhere classified: Secondary | ICD-10-CM | POA: Diagnosis not present

## 2020-11-24 DIAGNOSIS — M25562 Pain in left knee: Secondary | ICD-10-CM

## 2020-11-24 NOTE — Therapy (Signed)
[facility] Physical Therapy [address] [place], [place], [address] Phone: [phone]   Fax:  [phone]  Physical Therapy Treatment  Patient Details  Name: Anna Mccarthy MRN: [id number] Date of Birth: 07-Apr-1956 Referring Provider (PT): Frankey Shown, MD   Encounter Date: 11/24/2020   PT End of Session - 11/24/20 1226    Visit Number 3    Number of Visits 16    Date for PT Re-Evaluation 02/12/21    Authorization Type Medicare A&B and AARP    Progress Note Due on Visit 10    PT Start Time 2025    PT Stop Time 1234    PT Time Calculation (min) 49 min    Activity Tolerance Patient tolerated treatment well    Behavior During Therapy [facility] for tasks assessed/performed           Past Medical History:  Diagnosis Date  . Allergy   . Anxiety   . Arthritis   . Bronchitis   . Cancer ([place])    skin  . Fibromyalgia   . GERD (gastroesophageal reflux disease)   . Hypertension   . Osteoarthritis   . Pneumonia     Past Surgical History:  Procedure Laterality Date  . ABDOMINAL HYSTERECTOMY  1996  . BREAST BIOPSY Left 2011   stereo done by dr. Bary Castilla -benign  . COLONOSCOPY    . COLONOSCOPY WITH PROPOFOL N/A 05/30/2016   Procedure: COLONOSCOPY WITH PROPOFOL;  Surgeon: Lollie Sails, MD;  Location: [facility] ENDOSCOPY;  Service: Endoscopy;  Laterality: N/A;  . DILATION AND CURETTAGE OF UTERUS     x3  . HERNIA REPAIR  2012  . JOINT REPLACEMENT    . KNEE ARTHROSCOPY WITH MEDIAL MENISECTOMY Left 02/18/2016   Procedure: KNEE ARTHROSCOPY WITH PARTIAL MEDIAL MENISECTOMY;  Surgeon: Hessie Knows, MD;  Location: [facility] ORS;  Service: Orthopedics;  Laterality: Left;  . TOTAL KNEE ARTHROPLASTY Right 08/10/2020   Procedure: RIGHT TOTAL KNEE ARTHROPLASTY;  Surgeon: Leandrew Koyanagi, MD;  Location: [place];  Service: Orthopedics;  Laterality: Right;  . TOTAL KNEE ARTHROPLASTY Left 11/02/2020   Procedure: LEFT TOTAL KNEE ARTHROPLASTY;  Surgeon: Leandrew Koyanagi, MD;  Location: [place];   Service: Orthopedics;  Laterality: Left;  . TUBAL LIGATION  1984  . VARICOSE VEIN SURGERY Bilateral     There were no vitals filed for this visit.   Subjective Assessment - 11/24/20 1201    Subjective relays she was able to get to 95 deg in the CPM machine this weekend, not too much knee pain upon arrival but she took meds before arriving.    Pertinent History anxiety, arthritis, skin CA, fibromylagia, HTN,    Diagnostic tests X-ray    Patient Stated Goals improve strength and walking, stop hurting    Pain Onset 1 to 4 weeks ago              [facility] PT Assessment - 11/24/20 0001      Assessment   Medical Diagnosis left knee arthroplasty    Referring Provider (PT) Frankey Shown, MD    Onset Date/Surgical Date 11/02/20      AROM   Left Knee Extension 5    Left Knee Flexion 90      PROM   Left Knee Extension 3    Left Knee Flexion 100            [facility] Adult PT Treatment/Exercise - 11/24/20 0001      Knee/Hip Exercises: Stretches   Active Hamstring  Stretch Left;3 reps;30 seconds    Quad Stretch Left;3 reps;30 seconds    Quad Stretch Limitations supine with strap, leg EOB    Knee: Self-Stretch Limitations standing lunge stretch 10 sec X10, seated tailgate stretch 10 sec X10    Gastroc Stretch Both;3 reps;30 seconds    Gastroc Stretch Limitations slantboard      Knee/Hip Exercises: Aerobic   Nustep L6X10 min UE/LE      Knee/Hip Exercises: Machines for Strengthening   Cybex Leg Press shuttle leg press 56# bilat 2X15 then Lt leg only 37# 3X10      Knee/Hip Exercises: Standing   Forward Step Up Left;10 reps;Hand Hold: 1;Step Height: 6"      Knee/Hip Exercises: Seated   Other Seated Knee/Hip Exercises seated SLR 2X10 on Lt      Vasopneumatic   Number Minutes Vasopneumatic  10 minutes    Vasopnuematic Location  Knee    Vasopneumatic Pressure Medium    Vasopneumatic Temperature  34      Manual Therapy   Manual therapy comments Lt knee PROM to tolerance and flexion  mobs                    PT Short Term Goals - 11/17/20 1352      PT SHORT TERM GOAL #1   Title Patient demonstrates understanding of HEP for first 4 weeks.    Time 4    Period Weeks    Status New    Target Date 12/18/20      PT SHORT TERM GOAL #2   Title left knee PROM 0-100 degrees    Baseline 5-90 degrees    Time 4    Period Weeks    Status New    Target Date 12/18/20      PT SHORT TERM GOAL #3   Title -             PT Long Term Goals - 11/17/20 1355      PT LONG TERM GOAL #1   Title Pt will increase FOTO score to 60.    Baseline 47 on 11/17/2020    Time 8    Period Weeks    Status New    Target Date 01/15/21      PT LONG TERM GOAL #2   Title Pt will report pain </= 2/10 in left knee with ADL's.    Baseline Pt reporting 6/10 pain at times    Time 8    Period Weeks    Status New    Target Date 01/15/21      PT LONG TERM GOAL #3   Title Pt will increased left knee AROM to >/= 0-110 degrees    Time 8    Period Weeks    Status New    Target Date 01/15/21      PT LONG TERM GOAL #4   Title Pt will incrased her left knee strength to 5/5.    Baseline -    Time 8    Period Weeks    Status New    Target Date 01/15/21      PT LONG TERM GOAL #5   Title Patient ambulates >500' and negotiates ramps & curbs independently.    Baseline -    Time 8    Period Weeks    Status New    Target Date 01/15/21                 Plan - 11/24/20 1227  Clinical Impression Statement Continued to focus on knee ROM with additional strength training today. She did have some complaints of tighntess in her anterior knee likely quads so I encouraged her to add quad stretch into HEP. Overall she did show improved knee ROM since last week and is progressing as expected.    Personal Factors and Comorbidities Comorbidity 3+    Comorbidities anxiety, arthritis, skin CA, fibromylagia, HTN    Examination-Activity Limitations Lift;Locomotion  Level;Squat;Stairs;Stand;Transfers    Examination-Participation Restrictions Community Activity;Driving;Yard Work;Other;Shop    Stability/Clinical Decision Making Stable/Uncomplicated    Rehab Potential Good    PT Frequency 2x / week    PT Duration 8 weeks    PT Treatment/Interventions ADLs/Self Care Home Management;Electrical Stimulation;Cryotherapy;DME Instruction;Gait training;Stair training;Functional mobility training;Therapeutic activities;Therapeutic exercise;Balance training;Neuromuscular re-education;Patient/family education;Manual techniques;Scar mobilization;Passive range of motion;Dry needling;Taping;Vasopneumatic Device;Vestibular;Joint Manipulations    PT Next Visit Plan knee ROM, strengthening, balance, vasopneumatic    PT Home Exercise Plan Access Code: [email or web address] (continue previous HEP)    Consulted and Agree with Plan of Care Patient           Patient will benefit from skilled therapeutic intervention in order to improve the following deficits and impairments:  Abnormal gait,Decreased activity tolerance,Decreased balance,Decreased mobility,Decreased range of motion,Decreased scar mobility,Decreased skin integrity,Decreased strength,Increased edema,Impaired flexibility,Pain  Visit Diagnosis: Acute pain of right knee  Stiffness of right knee, not elsewhere classified  Muscle weakness (generalized)  Localized edema  Difficulty in walking, not elsewhere classified     Problem List Patient Active Problem List   Diagnosis Date Noted  . Primary osteoarthritis of left knee 11/02/2020  . Status post left knee replacement 11/02/2020  . Status post total right knee replacement 08/10/2020  . Primary osteoarthritis of right knee 01/30/2020  . Chronic pain of left knee 09/11/2018  . Meniscus degeneration, left 09/11/2018  . Fibromyalgia 09/11/2018  . Chronic pain syndrome 09/11/2018  . Venous ulcer ([place]) 02/02/2017  . Varicose veins of bilateral lower extremities with  pain 05/16/2016  . Chronic venous insufficiency 05/16/2016  . Pain in limb 05/16/2016  . Swelling of limb 05/16/2016  . DJD (degenerative joint disease) 05/16/2016    Debbe Odea ,PT,DPT 11/24/2020, 12:29 PM  [facility] Physical Therapy [address] [place], [place], [address] Phone: [phone]   Fax:  [phone]  Name: Anna Mccarthy MRN: [id number] Date of Birth: [birth year]

## 2020-11-25 ENCOUNTER — Encounter: Payer: Medicare Other | Admitting: Physical Therapy

## 2020-11-30 ENCOUNTER — Ambulatory Visit (INDEPENDENT_AMBULATORY_CARE_PROVIDER_SITE_OTHER): Payer: Medicare Other | Admitting: Physical Therapy

## 2020-11-30 ENCOUNTER — Other Ambulatory Visit: Payer: Self-pay

## 2020-11-30 ENCOUNTER — Encounter: Payer: Self-pay | Admitting: Physical Therapy

## 2020-11-30 DIAGNOSIS — M6281 Muscle weakness (generalized): Secondary | ICD-10-CM | POA: Diagnosis not present

## 2020-11-30 DIAGNOSIS — M25661 Stiffness of right knee, not elsewhere classified: Secondary | ICD-10-CM

## 2020-11-30 DIAGNOSIS — M25561 Pain in right knee: Secondary | ICD-10-CM

## 2020-11-30 DIAGNOSIS — R600 Localized edema: Secondary | ICD-10-CM

## 2020-11-30 DIAGNOSIS — R2681 Unsteadiness on feet: Secondary | ICD-10-CM

## 2020-11-30 DIAGNOSIS — R2689 Other abnormalities of gait and mobility: Secondary | ICD-10-CM

## 2020-11-30 DIAGNOSIS — M25562 Pain in left knee: Secondary | ICD-10-CM

## 2020-11-30 DIAGNOSIS — R262 Difficulty in walking, not elsewhere classified: Secondary | ICD-10-CM

## 2020-11-30 NOTE — Therapy (Signed)
[facility] Physical Therapy [address] [place], [place], [address] Phone: [phone]   Fax:  [phone]  Physical Therapy Treatment  Patient Details  Name: Anna Mccarthy MRN: [id number] Date of Birth: January 16, 1956 Referring Provider (PT): Frankey Shown, MD   Encounter Date: 11/30/2020   PT End of Session - 11/30/20 0934    Visit Number 4    Number of Visits 16    Date for PT Re-Evaluation 02/12/21    Authorization Type Medicare A&B and AARP    Progress Note Due on Visit 10    PT Start Time 0930    PT Stop Time 1010    PT Time Calculation (min) 40 min    Activity Tolerance Patient tolerated treatment well    Behavior During Therapy [facility] for tasks assessed/performed           Past Medical History:  Diagnosis Date  . Allergy   . Anxiety   . Arthritis   . Bronchitis   . Cancer ([place])    skin  . Fibromyalgia   . GERD (gastroesophageal reflux disease)   . Hypertension   . Osteoarthritis   . Pneumonia     Past Surgical History:  Procedure Laterality Date  . ABDOMINAL HYSTERECTOMY  1996  . BREAST BIOPSY Left 2011   stereo done by dr. Bary Castilla -benign  . COLONOSCOPY    . COLONOSCOPY WITH PROPOFOL N/A 05/30/2016   Procedure: COLONOSCOPY WITH PROPOFOL;  Surgeon: Lollie Sails, MD;  Location: [facility] ENDOSCOPY;  Service: Endoscopy;  Laterality: N/A;  . DILATION AND CURETTAGE OF UTERUS     x3  . HERNIA REPAIR  2012  . JOINT REPLACEMENT    . KNEE ARTHROSCOPY WITH MEDIAL MENISECTOMY Left 02/18/2016   Procedure: KNEE ARTHROSCOPY WITH PARTIAL MEDIAL MENISECTOMY;  Surgeon: Hessie Knows, MD;  Location: [facility] ORS;  Service: Orthopedics;  Laterality: Left;  . TOTAL KNEE ARTHROPLASTY Right 08/10/2020   Procedure: RIGHT TOTAL KNEE ARTHROPLASTY;  Surgeon: Leandrew Koyanagi, MD;  Location: [place];  Service: Orthopedics;  Laterality: Right;  . TOTAL KNEE ARTHROPLASTY Left 11/02/2020   Procedure: LEFT TOTAL KNEE ARTHROPLASTY;  Surgeon: Leandrew Koyanagi, MD;  Location: [place];   Service: Orthopedics;  Laterality: Left;  . TUBAL LIGATION  1984  . VARICOSE VEIN SURGERY Bilateral     There were no vitals filed for this visit.   Subjective Assessment - 11/30/20 0933    Subjective Pt arriving today reporting 2/10 pain in left knee. Pt reported going outside this weekend and rocking in her chair.    Pertinent History anxiety, arthritis, skin CA, fibromylagia, HTN,    Diagnostic tests X-ray    Patient Stated Goals improve strength and walking, stop hurting    Currently in Pain? Yes    Pain Score 2     Pain Location Knee    Pain Orientation Left    Pain Descriptors / Indicators Aching    Pain Type Surgical pain;Acute pain    Pain Onset 1 to 4 weeks ago              [facility] PT Assessment - 11/30/20 0001      Assessment   Medical Diagnosis left knee arthroplasty    Referring Provider (PT) Frankey Shown, MD    Onset Date/Surgical Date 11/02/20      AROM   Left Knee Extension 5    Left Knee Flexion 96      PROM   Left Knee Extension 2  Left Knee Flexion 105                         [facility] Adult PT Treatment/Exercise - 11/30/20 0001      Knee/Hip Exercises: Stretches   Active Hamstring Stretch Left;3 reps;30 seconds    Quad Stretch Left;3 reps;30 seconds    Quad Stretch Limitations supine with strap, leg EOB    Knee: Self-Stretch Limitations standing lunge stretch 10 sec X10, seated tailgate stretch 10 sec X10    Gastroc Stretch Both;3 reps;30 seconds    Gastroc Stretch Limitations slantboard      Knee/Hip Exercises: Aerobic   Recumbent Bike seat 6: L2 x 8 minutes began rocking and progressed to full revolustions      Knee/Hip Exercises: Machines for Strengthening   Cybex Leg Press Lt LE: 37# 3x10, bilateral LE's: 75# 3x10      Knee/Hip Exercises: Standing   Forward Step Up Left;10 reps;Hand Hold: 1;Step Height: 6"      Knee/Hip Exercises: Seated   Other Seated Knee/Hip Exercises seated SLR 2X10 on Lt      Vasopneumatic   Number  Minutes Vasopneumatic  10 minutes    Vasopnuematic Location  Knee    Vasopneumatic Pressure Medium    Vasopneumatic Temperature  34                    PT Short Term Goals - 11/30/20 1093      PT SHORT TERM GOAL #1   Title Patient demonstrates understanding of HEP for first 4 weeks.    Status On-going      PT SHORT TERM GOAL #2   Title left knee PROM 0-100 degrees    Status On-going             PT Long Term Goals - 11/17/20 1355      PT LONG TERM GOAL #1   Title Pt will increase FOTO score to 60.    Baseline 47 on 11/17/2020    Time 8    Period Weeks    Status New    Target Date 01/15/21      PT LONG TERM GOAL #2   Title Pt will report pain </= 2/10 in left knee with ADL's.    Baseline Pt reporting 6/10 pain at times    Time 8    Period Weeks    Status New    Target Date 01/15/21      PT LONG TERM GOAL #3   Title Pt will increased left knee AROM to >/= 0-110 degrees    Time 8    Period Weeks    Status New    Target Date 01/15/21      PT LONG TERM GOAL #4   Title Pt will incrased her left knee strength to 5/5.    Baseline -    Time 8    Period Weeks    Status New    Target Date 01/15/21      PT LONG TERM GOAL #5   Title Patient ambulates >500' and negotiates ramps & curbs independently.    Baseline -    Time 8    Period Weeks    Status New    Target Date 01/15/21                 Plan - 11/30/20 0935    Clinical Impression Statement Treatment focused on quad strengthening and range of motion. Pt started on  bike today with full revlouation with seat at 6. Continue skilled PT to maximize pt's function.    Personal Factors and Comorbidities Comorbidity 3+    Comorbidities anxiety, arthritis, skin CA, fibromylagia, HTN    Examination-Activity Limitations Lift;Locomotion Level;Squat;Stairs;Stand;Transfers    Examination-Participation Restrictions Community Activity;Driving;Yard Work;Other;Shop    Stability/Clinical Decision Making  Stable/Uncomplicated    Rehab Potential Good    PT Frequency 2x / week    PT Duration 8 weeks    PT Treatment/Interventions ADLs/Self Care Home Management;Electrical Stimulation;Cryotherapy;DME Instruction;Gait training;Stair training;Functional mobility training;Therapeutic activities;Therapeutic exercise;Balance training;Neuromuscular re-education;Patient/family education;Manual techniques;Scar mobilization;Passive range of motion;Dry needling;Taping;Vasopneumatic Device;Vestibular;Joint Manipulations    PT Next Visit Plan knee ROM, strengthening, balance, vasopneumatic    PT Home Exercise Plan Access Code: [email or web address] (continue previous HEP)    Consulted and Agree with Plan of Care Patient           Patient will benefit from skilled therapeutic intervention in order to improve the following deficits and impairments:  Abnormal gait,Decreased activity tolerance,Decreased balance,Decreased mobility,Decreased range of motion,Decreased scar mobility,Decreased skin integrity,Decreased strength,Increased edema,Impaired flexibility,Pain  Visit Diagnosis: Acute pain of right knee  Stiffness of right knee, not elsewhere classified  Muscle weakness (generalized)  Localized edema  Difficulty in walking, not elsewhere classified  Unsteadiness on feet  Other abnormalities of gait and mobility     Problem List Patient Active Problem List   Diagnosis Date Noted  . Primary osteoarthritis of left knee 11/02/2020  . Status post left knee replacement 11/02/2020  . Status post total right knee replacement 08/10/2020  . Primary osteoarthritis of right knee 01/30/2020  . Chronic pain of left knee 09/11/2018  . Meniscus degeneration, left 09/11/2018  . Fibromyalgia 09/11/2018  . Chronic pain syndrome 09/11/2018  . Venous ulcer ([place]) 02/02/2017  . Varicose veins of bilateral lower extremities with pain 05/16/2016  . Chronic venous insufficiency 05/16/2016  . Pain in limb 05/16/2016  . Swelling  of limb 05/16/2016  . DJD (degenerative joint disease) 05/16/2016    Oretha Caprice, PT, MPT 11/30/2020, 10:06 AM  [facility] Physical Therapy [address] [place], [place], [address] Phone: [phone]   Fax:  [phone]  Name: Anna Mccarthy MRN: [id number] Date of Birth: 07-25-1955

## 2020-12-02 ENCOUNTER — Encounter: Payer: Medicare Other | Admitting: Physical Therapy

## 2020-12-02 ENCOUNTER — Other Ambulatory Visit: Payer: Self-pay

## 2020-12-02 ENCOUNTER — Ambulatory Visit (INDEPENDENT_AMBULATORY_CARE_PROVIDER_SITE_OTHER): Payer: Medicare Other | Admitting: Physical Therapy

## 2020-12-02 DIAGNOSIS — R2681 Unsteadiness on feet: Secondary | ICD-10-CM

## 2020-12-02 DIAGNOSIS — M6281 Muscle weakness (generalized): Secondary | ICD-10-CM | POA: Diagnosis not present

## 2020-12-02 DIAGNOSIS — R600 Localized edema: Secondary | ICD-10-CM

## 2020-12-02 DIAGNOSIS — M25561 Pain in right knee: Secondary | ICD-10-CM | POA: Diagnosis not present

## 2020-12-02 DIAGNOSIS — M25661 Stiffness of right knee, not elsewhere classified: Secondary | ICD-10-CM | POA: Diagnosis not present

## 2020-12-02 DIAGNOSIS — R262 Difficulty in walking, not elsewhere classified: Secondary | ICD-10-CM

## 2020-12-02 DIAGNOSIS — M25562 Pain in left knee: Secondary | ICD-10-CM

## 2020-12-02 NOTE — Therapy (Signed)
[facility] Physical Therapy [address] [place], [place], [address] Phone: [phone]   Fax:  [phone]  Physical Therapy Treatment  Patient Details  Name: Anna Mccarthy MRN: [id number] Date of Birth: February 21, 1956 Referring Provider (PT): Frankey Shown, MD   Encounter Date: 12/02/2020   PT End of Session - 12/02/20 1707    Visit Number 5    Number of Visits 16    Date for PT Re-Evaluation 02/12/21    Authorization Type Medicare A&B and AARP    Progress Note Due on Visit 10    PT Start Time 1600    PT Stop Time 1646    PT Time Calculation (min) 46 min    Activity Tolerance Patient tolerated treatment well    Behavior During Therapy [facility] for tasks assessed/performed           Past Medical History:  Diagnosis Date  . Allergy   . Anxiety   . Arthritis   . Bronchitis   . Cancer ([place])    skin  . Fibromyalgia   . GERD (gastroesophageal reflux disease)   . Hypertension   . Osteoarthritis   . Pneumonia     Past Surgical History:  Procedure Laterality Date  . ABDOMINAL HYSTERECTOMY  1996  . BREAST BIOPSY Left 2011   stereo done by dr. Bary Castilla -benign  . COLONOSCOPY    . COLONOSCOPY WITH PROPOFOL N/A 05/30/2016   Procedure: COLONOSCOPY WITH PROPOFOL;  Surgeon: Lollie Sails, MD;  Location: [facility] ENDOSCOPY;  Service: Endoscopy;  Laterality: N/A;  . DILATION AND CURETTAGE OF UTERUS     x3  . HERNIA REPAIR  2012  . JOINT REPLACEMENT    . KNEE ARTHROSCOPY WITH MEDIAL MENISECTOMY Left 02/18/2016   Procedure: KNEE ARTHROSCOPY WITH PARTIAL MEDIAL MENISECTOMY;  Surgeon: Hessie Knows, MD;  Location: [facility] ORS;  Service: Orthopedics;  Laterality: Left;  . TOTAL KNEE ARTHROPLASTY Right 08/10/2020   Procedure: RIGHT TOTAL KNEE ARTHROPLASTY;  Surgeon: Leandrew Koyanagi, MD;  Location: [place];  Service: Orthopedics;  Laterality: Right;  . TOTAL KNEE ARTHROPLASTY Left 11/02/2020   Procedure: LEFT TOTAL KNEE ARTHROPLASTY;  Surgeon: Leandrew Koyanagi, MD;  Location: [place];   Service: Orthopedics;  Laterality: Left;  . TUBAL LIGATION  1984  . VARICOSE VEIN SURGERY Bilateral     There were no vitals filed for this visit.   Subjective Assessment - 12/02/20 1708    Subjective Pt arriving relays more overall Lt knee pain about 4/10 and that she can tell it will rain.    Pertinent History anxiety, arthritis, skin CA, fibromylagia, HTN,    Diagnostic tests X-ray    Patient Stated Goals improve strength and walking, stop hurting    Pain Onset 1 to 4 weeks ago            [facility] Adult PT Treatment/Exercise - 12/02/20 0001      Knee/Hip Exercises: Stretches   Active Hamstring Stretch Left;3 reps;30 seconds    Knee: Self-Stretch Limitations standing lunge stretch 10 sec X10    Gastroc Stretch Both;3 reps;30 seconds    Gastroc Stretch Limitations slantboard      Knee/Hip Exercises: Aerobic   Recumbent Bike L2X10  min      Knee/Hip Exercises: Machines for Strengthening   Cybex Leg Press Lt LE: 43# X25, bilateral LE's: 75# 3x10      Knee/Hip Exercises: Standing   Forward Step Up Right;Left;15 reps;Hand Hold: 1;Step Height: 6"      Knee/Hip Exercises: Seated  Other Seated Knee/Hip Exercises seated SLR 2X10 on Lt      Vasopneumatic   Number Minutes Vasopneumatic  10 minutes    Vasopnuematic Location  Knee    Vasopneumatic Pressure Medium    Vasopneumatic Temperature  34      Manual Therapy   Manual therapy comments Lt knee PROM to tolerance and extension mobs                    PT Short Term Goals - 11/30/20 2297      PT SHORT TERM GOAL #1   Title Patient demonstrates understanding of HEP for first 4 weeks.    Status On-going      PT SHORT TERM GOAL #2   Title left knee PROM 0-100 degrees    Status On-going             PT Long Term Goals - 11/17/20 1355      PT LONG TERM GOAL #1   Title Pt will increase FOTO score to 60.    Baseline 47 on 11/17/2020    Time 8    Period Weeks    Status New    Target Date 01/15/21      PT  LONG TERM GOAL #2   Title Pt will report pain </= 2/10 in left knee with ADL's.    Baseline Pt reporting 6/10 pain at times    Time 8    Period Weeks    Status New    Target Date 01/15/21      PT LONG TERM GOAL #3   Title Pt will increased left knee AROM to >/= 0-110 degrees    Time 8    Period Weeks    Status New    Target Date 01/15/21      PT LONG TERM GOAL #4   Title Pt will incrased her left knee strength to 5/5.    Baseline -    Time 8    Period Weeks    Status New    Target Date 01/15/21      PT LONG TERM GOAL #5   Title Patient ambulates >500' and negotiates ramps & curbs independently.    Baseline -    Time 8    Period Weeks    Status New    Target Date 01/15/21                 Plan - 12/02/20 1709    Clinical Impression Statement Continued to work to improve overall knee ROM and quad strength today with good overall tolerance. She is progressing as expected.    Personal Factors and Comorbidities Comorbidity 3+    Comorbidities anxiety, arthritis, skin CA, fibromylagia, HTN    Examination-Activity Limitations Lift;Locomotion Level;Squat;Stairs;Stand;Transfers    Examination-Participation Restrictions Community Activity;Driving;Yard Work;Other;Shop    Stability/Clinical Decision Making Stable/Uncomplicated    Rehab Potential Good    PT Frequency 2x / week    PT Duration 8 weeks    PT Treatment/Interventions ADLs/Self Care Home Management;Electrical Stimulation;Cryotherapy;DME Instruction;Gait training;Stair training;Functional mobility training;Therapeutic activities;Therapeutic exercise;Balance training;Neuromuscular re-education;Patient/family education;Manual techniques;Scar mobilization;Passive range of motion;Dry needling;Taping;Vasopneumatic Device;Vestibular;Joint Manipulations    PT Next Visit Plan knee ROM, strengthening, balance, vasopneumatic    PT Home Exercise Plan Access Code: [email or web address] (continue previous HEP)    Consulted and Agree with Plan  of Care Patient           Patient will benefit from skilled therapeutic intervention in order to improve the following deficits  and impairments:  Abnormal gait,Decreased activity tolerance,Decreased balance,Decreased mobility,Decreased range of motion,Decreased scar mobility,Decreased skin integrity,Decreased strength,Increased edema,Impaired flexibility,Pain  Visit Diagnosis: Acute pain of right knee  Stiffness of right knee, not elsewhere classified  Muscle weakness (generalized)  Localized edema  Difficulty in walking, not elsewhere classified     Problem List Patient Active Problem List   Diagnosis Date Noted  . Primary osteoarthritis of left knee 11/02/2020  . Status post left knee replacement 11/02/2020  . Status post total right knee replacement 08/10/2020  . Primary osteoarthritis of right knee 01/30/2020  . Chronic pain of left knee 09/11/2018  . Meniscus degeneration, left 09/11/2018  . Fibromyalgia 09/11/2018  . Chronic pain syndrome 09/11/2018  . Venous ulcer ([place]) 02/02/2017  . Varicose veins of bilateral lower extremities with pain 05/16/2016  . Chronic venous insufficiency 05/16/2016  . Pain in limb 05/16/2016  . Swelling of limb 05/16/2016  . DJD (degenerative joint disease) 05/16/2016    Silvestre Mesi 12/02/2020, 5:10 PM  [facility] Physical Therapy [address] [place], [place], [address] Phone: [phone]   Fax:  [phone]  Name: Anna Mccarthy MRN: [id number] Date of Birth: 1956-05-20

## 2020-12-04 ENCOUNTER — Other Ambulatory Visit: Payer: Self-pay

## 2020-12-04 ENCOUNTER — Ambulatory Visit: Payer: Medicare Other | Attending: Internal Medicine

## 2020-12-04 DIAGNOSIS — Z23 Encounter for immunization: Secondary | ICD-10-CM

## 2020-12-04 MED ORDER — PFIZER-BIONT COVID-19 VAC-TRIS 30 MCG/0.3ML IM SUSP
INTRAMUSCULAR | 0 refills | Status: DC
  Filled 2020-12-04: qty 0.3, 1d supply, fill #0

## 2020-12-04 NOTE — Progress Notes (Signed)
   Covid-19 Vaccination Clinic  Name:  Anna Mccarthy    MRN: [id number] DOB: [birth year]  12/04/2020  Anna Mccarthy was observed post Covid-19 immunization for 15 minutes without incident. She was provided with Vaccine Information Sheet and instruction to access the V-Safe system.   Anna Mccarthy was instructed to call 911 with any severe reactions post vaccine: Difficulty breathing  Swelling of face and throat  A fast heartbeat  A bad rash all over body  Dizziness and weakness   Immunizations Administered     Name Date Dose VIS Date Route   PFIZER Comrnaty(Gray TOP) Covid-19 Vaccine 12/04/2020 10:03 AM 0.3 mL 06/04/2020 Intramuscular   Manufacturer: [place]   Lot: [id number]   NDC: Elizabeth, PharmD, MBA Clinical Acute Care Pharmacist

## 2020-12-07 ENCOUNTER — Other Ambulatory Visit: Payer: Self-pay

## 2020-12-07 ENCOUNTER — Encounter: Payer: Self-pay | Admitting: Physical Therapy

## 2020-12-07 ENCOUNTER — Ambulatory Visit (INDEPENDENT_AMBULATORY_CARE_PROVIDER_SITE_OTHER): Payer: Medicare Other | Admitting: Physical Therapy

## 2020-12-07 DIAGNOSIS — R2689 Other abnormalities of gait and mobility: Secondary | ICD-10-CM

## 2020-12-07 DIAGNOSIS — R600 Localized edema: Secondary | ICD-10-CM | POA: Diagnosis not present

## 2020-12-07 DIAGNOSIS — R262 Difficulty in walking, not elsewhere classified: Secondary | ICD-10-CM

## 2020-12-07 DIAGNOSIS — R2681 Unsteadiness on feet: Secondary | ICD-10-CM

## 2020-12-07 DIAGNOSIS — M25562 Pain in left knee: Secondary | ICD-10-CM

## 2020-12-07 DIAGNOSIS — M6281 Muscle weakness (generalized): Secondary | ICD-10-CM

## 2020-12-07 NOTE — Therapy (Signed)
[facility] [address] [place], [place], [address] Phone: [phone]   Fax:  [phone]  Physical Therapy Treatment  Patient Details  Name: Anna Mccarthy MRN: [id number] Date of Birth: [birth year] Referring Provider (PT): Frankey Shown, MD   Encounter Date: 12/07/2020   PT End of Session - 12/07/20 1359     Visit Number 6    Number of Visits 16    Date for PT Re-Evaluation 02/12/21    Authorization Type Medicare A&B and AARP    Progress Note Due on Visit 10    PT Start Time 1345    PT Stop Time 1425    PT Time Calculation (min) 40 min    Activity Tolerance Patient tolerated treatment well    Behavior During Therapy [facility] for tasks assessed/performed             Past Medical History:  Diagnosis Date   Allergy    Anxiety    Arthritis    Bronchitis    Cancer ([place])    skin   Fibromyalgia    GERD (gastroesophageal reflux disease)    Hypertension    Osteoarthritis    Pneumonia     Past Surgical History:  Procedure Laterality Date   ABDOMINAL HYSTERECTOMY  1996   BREAST BIOPSY Left 2011   stereo done by dr. Bary Castilla -benign   COLONOSCOPY     COLONOSCOPY WITH PROPOFOL N/A 05/30/2016   Procedure: COLONOSCOPY WITH PROPOFOL;  Surgeon: Lollie Sails, MD;  Location: [facility] ENDOSCOPY;  Service: Endoscopy;  Laterality: N/A;   DILATION AND CURETTAGE OF UTERUS     x3   HERNIA REPAIR  2012   JOINT REPLACEMENT     KNEE ARTHROSCOPY WITH MEDIAL MENISECTOMY Left 02/18/2016   Procedure: KNEE ARTHROSCOPY WITH PARTIAL MEDIAL MENISECTOMY;  Surgeon: Hessie Knows, MD;  Location: [facility] ORS;  Service: Orthopedics;  Laterality: Left;   TOTAL KNEE ARTHROPLASTY Right 08/10/2020   Procedure: RIGHT TOTAL KNEE ARTHROPLASTY;  Surgeon: Leandrew Koyanagi, MD;  Location: [place];  Service: Orthopedics;  Laterality: Right;   TOTAL KNEE ARTHROPLASTY Left 11/02/2020   Procedure: LEFT TOTAL KNEE ARTHROPLASTY;  Surgeon: Leandrew Koyanagi, MD;  Location: [place];  Service:  Orthopedics;  Laterality: Left;   TUBAL LIGATION  1984   VARICOSE VEIN SURGERY Bilateral     There were no vitals filed for this visit.   Subjective Assessment - 12/07/20 1357     Subjective Pt arriving to therapy today reporting 1/10 pain in left knee across joint line.    Pertinent History anxiety, arthritis, skin CA, fibromylagia, HTN,    Patient Stated Goals improve strength and walking, stop hurting    Currently in Pain? Yes    Pain Score 1     Pain Location Knee    Pain Orientation Left    Pain Descriptors / Indicators Aching;Sore    Pain Type Surgical pain    Pain Onset 1 to 4 weeks ago    Pain Frequency Intermittent                [facility] PT Assessment - 12/07/20 0001       Assessment   Medical Diagnosis left knee arthroplasty    Referring Provider (PT) Frankey Shown, MD    Onset Date/Surgical Date 11/02/20      AROM   Left Knee Extension 4    Left Knee Flexion 120      PROM   Left Knee Extension 2  Left Knee Flexion 125                           [facility] Adult PT Treatment/Exercise - 12/07/20 0001       Knee/Hip Exercises: Stretches   Knee: Self-Stretch Limitations standing lunge stretch 10 sec X10    Gastroc Stretch Both;30 seconds;2 reps    Gastroc Stretch Limitations slant board      Knee/Hip Exercises: Aerobic   Recumbent Bike L4  8 minutes      Knee/Hip Exercises: Machines for Strengthening   Cybex Leg Press bilateral LE"s 100# 2x15, L LE only: 50# 2x15      Knee/Hip Exercises: Standing   Lateral Step Up Left;15 reps;Hand Hold: 0    Forward Step Up Both;15 reps;Hand Hold: 0    Other Standing Knee Exercises sit to stand x 10 No UE support    Other Standing Knee Exercises TRX squats 2x10      Knee/Hip Exercises: Supine   Straight Leg Raise with External Rotation Strengthening;Left;2 sets;10 reps      Manual Therapy   Manual therapy comments Lt knee PROM to tolerance and extension mobs                      PT  Short Term Goals - 12/07/20 1407       PT SHORT TERM GOAL #1   Title Patient demonstrates understanding of HEP for first 4 weeks.    Status On-going      PT SHORT TERM GOAL #2   Title left knee PROM 0-100 degrees    Baseline 125 on 12/07/2020    Status Achieved               PT Long Term Goals - 12/07/20 1407       PT LONG TERM GOAL #1   Title Pt will increase FOTO score to 60.    Status On-going      PT LONG TERM GOAL #2   Title Pt will report pain </= 2/10 in left knee with ADL's.    Baseline Pt reporting 6/10 pain at times    Status On-going      PT LONG TERM GOAL #3   Title Pt will increased left knee AROM to >/= 0-110 degrees    Baseline 120 degrees, on 12/07/2020.    Status On-going      PT LONG TERM GOAL #4   Title Pt will incrased her left knee strength to 5/5.    Status On-going      PT LONG TERM GOAL #5   Title Patient ambulates >500' and negotiates ramps & curbs independently.    Status On-going                   Plan - 12/07/20 1359     Clinical Impression Statement Pt tolerating exercises well progressing with strength and ROM. Pt reporting only 1/10 pain in left knee. Continue with skilled PT to maximize pt's function.    Personal Factors and Comorbidities Comorbidity 3+    Comorbidities anxiety, arthritis, skin CA, fibromylagia, HTN    Examination-Activity Limitations Lift;Locomotion Level;Squat;Stairs;Stand;Transfers    Examination-Participation Restrictions Community Activity;Driving;Yard Work;Other;Shop    Stability/Clinical Decision Making Stable/Uncomplicated    Rehab Potential Good    PT Frequency 2x / week    PT Duration 8 weeks    PT Treatment/Interventions ADLs/Self Care Home Management;Electrical Stimulation;Cryotherapy;DME Instruction;Gait training;Stair training;Functional mobility training;Therapeutic activities;Therapeutic exercise;Balance  training;Neuromuscular re-education;Patient/family education;Manual techniques;Scar  mobilization;Passive range of motion;Dry needling;Taping;Vasopneumatic Device;Vestibular;Joint Manipulations    PT Next Visit Plan knee ROM, strengthening, balance, vasopneumatic    PT Home Exercise Plan Access Code: [email or web address] (continue previous HEP)    Consulted and Agree with Plan of Care Patient             Patient will benefit from skilled therapeutic intervention in order to improve the following deficits and impairments:  Abnormal gait, Decreased activity tolerance, Decreased balance, Decreased mobility, Decreased range of motion, Decreased scar mobility, Decreased skin integrity, Decreased strength, Increased edema, Impaired flexibility, Pain  Visit Diagnosis: Muscle weakness (generalized)  Acute pain of left knee  Localized edema  Difficulty in walking, not elsewhere classified  Unsteadiness on feet  Other abnormalities of gait and mobility     Problem List Patient Active Problem List   Diagnosis Date Noted   Primary osteoarthritis of left knee 11/02/2020   Status post left knee replacement 11/02/2020   Status post total right knee replacement 08/10/2020   Primary osteoarthritis of right knee 01/30/2020   Chronic pain of left knee 09/11/2018   Meniscus degeneration, left 09/11/2018   Fibromyalgia 09/11/2018   Chronic pain syndrome 09/11/2018   Venous ulcer ([place]) 02/02/2017   Varicose veins of bilateral lower extremities with pain 05/16/2016   Chronic venous insufficiency 05/16/2016   Pain in limb 05/16/2016   Swelling of limb 05/16/2016   DJD (degenerative joint disease) 05/16/2016    Oretha Caprice, PT, MPT 12/07/2020, 2:29 PM  [facility] [address] [place], [place], [address] Phone: [phone]   Fax:  [phone]  Name: Anna Mccarthy MRN: [id number] Date of Birth: 08-23-1955

## 2020-12-10 ENCOUNTER — Ambulatory Visit (INDEPENDENT_AMBULATORY_CARE_PROVIDER_SITE_OTHER): Payer: Medicare Other | Admitting: Physical Therapy

## 2020-12-10 ENCOUNTER — Other Ambulatory Visit: Payer: Self-pay

## 2020-12-10 DIAGNOSIS — M6281 Muscle weakness (generalized): Secondary | ICD-10-CM | POA: Diagnosis not present

## 2020-12-10 DIAGNOSIS — R600 Localized edema: Secondary | ICD-10-CM | POA: Diagnosis not present

## 2020-12-10 DIAGNOSIS — R262 Difficulty in walking, not elsewhere classified: Secondary | ICD-10-CM | POA: Diagnosis not present

## 2020-12-10 DIAGNOSIS — M25562 Pain in left knee: Secondary | ICD-10-CM

## 2020-12-10 DIAGNOSIS — R2681 Unsteadiness on feet: Secondary | ICD-10-CM

## 2020-12-10 NOTE — Therapy (Signed)
[facility] [address] [place], [place], [address] Phone: [phone]   Fax:  [phone]  Physical Therapy Treatment  Patient Details  Name: Anna Mccarthy MRN: [id number] Date of Birth: [birth year] Referring Provider (PT): Frankey Shown, MD   Encounter Date: 12/10/2020   PT End of Session - 12/10/20 1027     Visit Number 7    Number of Visits 16    Date for PT Re-Evaluation 02/12/21    Authorization Type Medicare A&B and AARP    Progress Note Due on Visit 10    PT Start Time 0930    PT Stop Time 1017    PT Time Calculation (min) 47 min    Activity Tolerance Patient tolerated treatment well    Behavior During Therapy [facility] for tasks assessed/performed             Past Medical History:  Diagnosis Date   Allergy    Anxiety    Arthritis    Bronchitis    Cancer ([place])    skin   Fibromyalgia    GERD (gastroesophageal reflux disease)    Hypertension    Osteoarthritis    Pneumonia     Past Surgical History:  Procedure Laterality Date   ABDOMINAL HYSTERECTOMY  1996   BREAST BIOPSY Left 2011   stereo done by dr. Bary Castilla -benign   COLONOSCOPY     COLONOSCOPY WITH PROPOFOL N/A 05/30/2016   Procedure: COLONOSCOPY WITH PROPOFOL;  Surgeon: Lollie Sails, MD;  Location: [facility] ENDOSCOPY;  Service: Endoscopy;  Laterality: N/A;   DILATION AND CURETTAGE OF UTERUS     x3   HERNIA REPAIR  2012   JOINT REPLACEMENT     KNEE ARTHROSCOPY WITH MEDIAL MENISECTOMY Left 02/18/2016   Procedure: KNEE ARTHROSCOPY WITH PARTIAL MEDIAL MENISECTOMY;  Surgeon: Hessie Knows, MD;  Location: [facility] ORS;  Service: Orthopedics;  Laterality: Left;   TOTAL KNEE ARTHROPLASTY Right 08/10/2020   Procedure: RIGHT TOTAL KNEE ARTHROPLASTY;  Surgeon: Leandrew Koyanagi, MD;  Location: [place];  Service: Orthopedics;  Laterality: Right;   TOTAL KNEE ARTHROPLASTY Left 11/02/2020   Procedure: LEFT TOTAL KNEE ARTHROPLASTY;  Surgeon: Leandrew Koyanagi, MD;  Location: [place];  Service:  Orthopedics;  Laterality: Left;   TUBAL LIGATION  1984   VARICOSE VEIN SURGERY Bilateral     There were no vitals filed for this visit.   Subjective Assessment - 12/10/20 0951     Subjective Pt arriving today with reports of a little more swelling and pain in her left knee    Pertinent History anxiety, arthritis, skin CA, fibromylagia, HTN,    Diagnostic tests X-ray    Patient Stated Goals improve strength and walking, stop hurting    Pain Onset 1 to 4 weeks ago               [facility] Adult PT Treatment/Exercise - 12/10/20 0001       Knee/Hip Exercises: Stretches   Knee: Self-Stretch Limitations seated tailgate stretch 10 sec X10    Gastroc Stretch Both;30 seconds;2 reps    Gastroc Stretch Limitations slant board      Knee/Hip Exercises: Aerobic   Recumbent Bike L4  8 minutes      Knee/Hip Exercises: Machines for Strengthening   Cybex Leg Press bilateral LE"s 100# 2x15, L LE only: 50# 2x15      Knee/Hip Exercises: Standing   Lateral Step Up Left;Hand Hold: 0;10 reps    Forward Step Up Left;10 reps;Hand Hold: 0;Step  Height: 6"      Knee/Hip Exercises: Seated   Long Arc Quad Left;2 sets;15 reps    Long Arc Quad Weight 5 lbs.    Hamstring Curl Left;3 sets;15 reps    Hamstring Limitations green      Vasopneumatic   Number Minutes Vasopneumatic  10 minutes    Vasopnuematic Location  Knee    Vasopneumatic Pressure Medium    Vasopneumatic Temperature  34      Manual Therapy   Manual therapy comments Lt knee PROM to tolerance and manual hamstring stretching                      PT Short Term Goals - 12/07/20 1407       PT SHORT TERM GOAL #1   Title Patient demonstrates understanding of HEP for first 4 weeks.    Status On-going      PT SHORT TERM GOAL #2   Title left knee PROM 0-100 degrees    Baseline 125 on 12/07/2020    Status Achieved               PT Long Term Goals - 12/07/20 1407       PT LONG TERM GOAL #1   Title Pt will increase  FOTO score to 60.    Status On-going      PT LONG TERM GOAL #2   Title Pt will report pain </= 2/10 in left knee with ADL's.    Baseline Pt reporting 6/10 pain at times    Status On-going      PT LONG TERM GOAL #3   Title Pt will increased left knee AROM to >/= 0-110 degrees    Baseline 120 degrees, on 12/07/2020.    Status On-going      PT LONG TERM GOAL #4   Title Pt will incrased her left knee strength to 5/5.    Status On-going      PT LONG TERM GOAL #5   Title Patient ambulates >500' and negotiates ramps & curbs independently.    Status On-going                   Plan - 12/10/20 9935     Clinical Impression Statement despite a little more pain, stiffness, and swelling in her knee she had good overall tolerance to sesison. She will need MD progress note next visit before her follow up.    Personal Factors and Comorbidities Comorbidity 3+    Comorbidities anxiety, arthritis, skin CA, fibromylagia, HTN    Examination-Activity Limitations Lift;Locomotion Level;Squat;Stairs;Stand;Transfers    Examination-Participation Restrictions Community Activity;Driving;Yard Work;Other;Shop    Stability/Clinical Decision Making Stable/Uncomplicated    Rehab Potential Good    PT Frequency 2x / week    PT Duration 8 weeks    PT Treatment/Interventions ADLs/Self Care Home Management;Electrical Stimulation;Cryotherapy;DME Instruction;Gait training;Stair training;Functional mobility training;Therapeutic activities;Therapeutic exercise;Balance training;Neuromuscular re-education;Patient/family education;Manual techniques;Scar mobilization;Passive range of motion;Dry needling;Taping;Vasopneumatic Device;Vestibular;Joint Manipulations    PT Next Visit Plan knee ROM, strengthening, balance, vasopneumatic    PT Home Exercise Plan Access Code: [email or web address] (continue previous HEP)    Consulted and Agree with Plan of Care Patient             Patient will benefit from skilled therapeutic  intervention in order to improve the following deficits and impairments:  Abnormal gait, Decreased activity tolerance, Decreased balance, Decreased mobility, Decreased range of motion, Decreased scar mobility, Decreased skin integrity, Decreased strength, Increased edema, Impaired flexibility, Pain  Visit Diagnosis: Acute pain of left knee  Muscle weakness (generalized)  Localized edema  Difficulty in walking, not elsewhere classified  Unsteadiness on feet     Problem List Patient Active Problem List   Diagnosis Date Noted   Primary osteoarthritis of left knee 11/02/2020   Status post left knee replacement 11/02/2020   Status post total right knee replacement 08/10/2020   Primary osteoarthritis of right knee 01/30/2020   Chronic pain of left knee 09/11/2018   Meniscus degeneration, left 09/11/2018   Fibromyalgia 09/11/2018   Chronic pain syndrome 09/11/2018   Venous ulcer ([place]) 02/02/2017   Varicose veins of bilateral lower extremities with pain 05/16/2016   Chronic venous insufficiency 05/16/2016   Pain in limb 05/16/2016   Swelling of limb 05/16/2016   DJD (degenerative joint disease) 05/16/2016    Silvestre Mesi 12/10/2020, 10:28 AM  [facility] Therapy [address] [place], [place], [address] Phone: [phone]   Fax:  [phone]  Name: KIRSTINE JACQUIN MRN: [id number] Date of Birth: Jan 21, 1956

## 2020-12-14 ENCOUNTER — Encounter: Payer: Self-pay | Admitting: Physical Therapy

## 2020-12-14 ENCOUNTER — Ambulatory Visit (INDEPENDENT_AMBULATORY_CARE_PROVIDER_SITE_OTHER): Payer: Medicare Other | Admitting: Physical Therapy

## 2020-12-14 ENCOUNTER — Other Ambulatory Visit: Payer: Self-pay

## 2020-12-14 DIAGNOSIS — R2681 Unsteadiness on feet: Secondary | ICD-10-CM

## 2020-12-14 DIAGNOSIS — M25661 Stiffness of right knee, not elsewhere classified: Secondary | ICD-10-CM

## 2020-12-14 DIAGNOSIS — R2689 Other abnormalities of gait and mobility: Secondary | ICD-10-CM

## 2020-12-14 DIAGNOSIS — M6281 Muscle weakness (generalized): Secondary | ICD-10-CM | POA: Diagnosis not present

## 2020-12-14 DIAGNOSIS — M25562 Pain in left knee: Secondary | ICD-10-CM

## 2020-12-14 DIAGNOSIS — R262 Difficulty in walking, not elsewhere classified: Secondary | ICD-10-CM | POA: Diagnosis not present

## 2020-12-14 DIAGNOSIS — R600 Localized edema: Secondary | ICD-10-CM

## 2020-12-14 DIAGNOSIS — M25561 Pain in right knee: Secondary | ICD-10-CM

## 2020-12-14 NOTE — Therapy (Signed)
[facility] [address] [place], [place], [address] Phone: [phone]   Fax:  [phone]  Physical Therapy Treatment Progress Note  Patient Details  Name: Anna Mccarthy MRN: [id number] Date of Birth: 01-12-1956 Referring Provider (PT): Frankey Shown, MD   Encounter Date: 12/14/2020   PT End of Session - 12/14/20 1030     Visit Number 8    Number of Visits 16    Date for PT Re-Evaluation 02/12/21    Authorization Type Medicare A&B and AARP    Authorization Time Period progress note sent at 8th visit on 6/202/2022.    Progress Note Due on Visit 16    PT Start Time 1015    PT Stop Time 1053    PT Time Calculation (min) 38 min    Activity Tolerance Patient tolerated treatment well    Behavior During Therapy [facility] for tasks assessed/performed             Past Medical History:  Diagnosis Date   Allergy    Anxiety    Arthritis    Bronchitis    Cancer ([place])    skin   Fibromyalgia    GERD (gastroesophageal reflux disease)    Hypertension    Osteoarthritis    Pneumonia     Past Surgical History:  Procedure Laterality Date   ABDOMINAL HYSTERECTOMY  1996   BREAST BIOPSY Left 2011   stereo done by dr. Bary Castilla -benign   COLONOSCOPY     COLONOSCOPY WITH PROPOFOL N/A 05/30/2016   Procedure: COLONOSCOPY WITH PROPOFOL;  Surgeon: Lollie Sails, MD;  Location: [facility] ENDOSCOPY;  Service: Endoscopy;  Laterality: N/A;   DILATION AND CURETTAGE OF UTERUS     x3   HERNIA REPAIR  2012   JOINT REPLACEMENT     KNEE ARTHROSCOPY WITH MEDIAL MENISECTOMY Left 02/18/2016   Procedure: KNEE ARTHROSCOPY WITH PARTIAL MEDIAL MENISECTOMY;  Surgeon: Hessie Knows, MD;  Location: [facility] ORS;  Service: Orthopedics;  Laterality: Left;   TOTAL KNEE ARTHROPLASTY Right 08/10/2020   Procedure: RIGHT TOTAL KNEE ARTHROPLASTY;  Surgeon: Leandrew Koyanagi, MD;  Location: [place];  Service: Orthopedics;  Laterality: Right;   TOTAL KNEE ARTHROPLASTY Left 11/02/2020   Procedure:  LEFT TOTAL KNEE ARTHROPLASTY;  Surgeon: Leandrew Koyanagi, MD;  Location: [place];  Service: Orthopedics;  Laterality: Left;   TUBAL LIGATION  1984   VARICOSE VEIN SURGERY Bilateral     There were no vitals filed for this visit.   Subjective Assessment - 12/14/20 1027     Subjective Pt arriving today reporting more stiffness 2/10.    Pertinent History anxiety, arthritis, skin CA, fibromylagia, HTN,    Diagnostic tests X-ray    Patient Stated Goals improve strength and walking, stop hurting    Currently in Pain? Yes    Pain Score 2     Pain Location Knee    Pain Orientation Left    Pain Descriptors / Indicators Tightness    Pain Type Surgical pain    Pain Onset 1 to 4 weeks ago    Pain Frequency Intermittent                               [facility] Adult PT Treatment/Exercise - 12/14/20 0001       Neuro Re-ed    Neuro Re-ed Details  SLS: R LE 30 seconds, L LE: 10 secconds following multiple tries, vectors with base L LE x  15      Knee/Hip Exercises: Stretches   Knee: Self-Stretch Limitations seated tailgate stretch 10 sec X10    Gastroc Stretch Both;30 seconds;2 reps    Gastroc Stretch Limitations slant board      Knee/Hip Exercises: Aerobic   Recumbent Bike L4 x 8 minutes      Knee/Hip Exercises: Machines for Strengthening   Cybex Leg Press bilateral LE"s 106# 2x15, L LE only: 56 # 2x15      Knee/Hip Exercises: Standing   Lateral Step Up Left;Hand Hold: 0;15 reps    Forward Step Up Left;Hand Hold: 0;Step Height: 6";15 reps      Knee/Hip Exercises: Seated   Long Arc Quad Left;2 sets;15 reps    Long Arc Quad Weight 5 lbs.    Hamstring Curl Left;3 sets;15 reps    Hamstring Limitations green      Vasopneumatic   Number Minutes Vasopneumatic  --    Vasopnuematic Location  --    Vasopneumatic Pressure --    Vasopneumatic Temperature  --      Manual Therapy   Manual therapy comments Lt knee PROM to tolerance and manual hamstring stretching                       PT Short Term Goals - 12/14/20 1034       PT SHORT TERM GOAL #1   Title Patient demonstrates understanding of HEP for first 4 weeks.    Status Achieved      PT SHORT TERM GOAL #2   Title left knee PROM 0-100 degrees    Status Achieved               PT Long Term Goals - 12/14/20 1034       PT LONG TERM GOAL #1   Title Pt will increase FOTO score to 60.    Status On-going      PT LONG TERM GOAL #2   Title Pt will report pain </= 2/10 in left knee with ADL's.    Baseline 2/10 stiffness reported today, pt reporting her pain can increase to 5-6/10 at times depending on her activity levels.    Status On-going      PT LONG TERM GOAL #3   Title Pt will increased left knee AROM to >/= 0-110 degrees    Baseline 120 degrees, on 12/07/2020.    Status Achieved      PT LONG TERM GOAL #4   Title Pt will incrased her left knee strength to 5/5.    Baseline 4+/5 in left knee    Time 8    Period Weeks    Status On-going      PT LONG TERM GOAL #5   Title Patient ambulates >500' and negotiates ramps & curbs independently.    Baseline pt walking 3 blocks around neighborhood with increased soreness at night and some the next day.    Status Achieved      Additional Long Term Goals   Additional Long Term Goals Yes      PT LONG TERM GOAL #7   Title Pt will be able to desend 2 flights of stairs with no handrail with pain </= 3/10.    Baseline -    Time 3    Period Weeks    Status New    Target Date 01/15/21                   Plan - 12/14/20 1032  Clinical Impression Statement Pt progresing nicely with strengthening exercises today. Pt still needs to work on stair training more  on descending and dynamic balance. Pt's AROM is 2-122 degrees, PROM 0-125 degrees. Continue skilled PT to progress with SLS activites, stair navigation and overall muscle endurance with functional activities.    Personal Factors and Comorbidities Comorbidity 3+     Comorbidities anxiety, arthritis, skin CA, fibromylagia, HTN    Examination-Activity Limitations Lift;Locomotion Level;Squat;Stairs;Stand;Transfers    Examination-Participation Restrictions Community Activity;Driving;Yard Work;Other;Shop    Stability/Clinical Decision Making Stable/Uncomplicated    Rehab Potential Good    PT Frequency 2x / week    PT Duration 8 weeks    PT Treatment/Interventions ADLs/Self Care Home Management;Electrical Stimulation;Cryotherapy;DME Instruction;Gait training;Stair training;Functional mobility training;Therapeutic activities;Therapeutic exercise;Balance training;Neuromuscular re-education;Patient/family education;Manual techniques;Scar mobilization;Passive range of motion;Dry needling;Taping;Vasopneumatic Device;Vestibular;Joint Manipulations    PT Next Visit Plan knee ROM, strengthening, balance    PT Home Exercise Plan Access Code: [email or web address] (continue previous HEP)    Consulted and Agree with Plan of Care Patient             Patient will benefit from skilled therapeutic intervention in order to improve the following deficits and impairments:  Abnormal gait, Decreased activity tolerance, Decreased balance, Decreased mobility, Decreased range of motion, Decreased scar mobility, Decreased skin integrity, Decreased strength, Increased edema, Impaired flexibility, Pain  Visit Diagnosis: Acute pain of left knee  Muscle weakness (generalized)  Localized edema  Difficulty in walking, not elsewhere classified  Unsteadiness on feet  Other abnormalities of gait and mobility  Acute pain of right knee  Stiffness of right knee, not elsewhere classified     Problem List Patient Active Problem List   Diagnosis Date Noted   Primary osteoarthritis of left knee 11/02/2020   Status post left knee replacement 11/02/2020   Status post total right knee replacement 08/10/2020   Primary osteoarthritis of right knee 01/30/2020   Chronic pain of left knee  09/11/2018   Meniscus degeneration, left 09/11/2018   Fibromyalgia 09/11/2018   Chronic pain syndrome 09/11/2018   Venous ulcer ([place]) 02/02/2017   Varicose veins of bilateral lower extremities with pain 05/16/2016   Chronic venous insufficiency 05/16/2016   Pain in limb 05/16/2016   Swelling of limb 05/16/2016   DJD (degenerative joint disease) 05/16/2016    Oretha Caprice, PT, MPT 12/14/2020, 10:53 AM  [facility] [address] [place], [place], [address] Phone: [phone]   Fax:  [phone]  Name: TIFFANEE MCNEE MRN: [id number] Date of Birth: [birth year]

## 2020-12-15 ENCOUNTER — Ambulatory Visit: Payer: Self-pay

## 2020-12-15 ENCOUNTER — Ambulatory Visit (INDEPENDENT_AMBULATORY_CARE_PROVIDER_SITE_OTHER): Payer: Medicare Other

## 2020-12-15 ENCOUNTER — Ambulatory Visit (INDEPENDENT_AMBULATORY_CARE_PROVIDER_SITE_OTHER): Payer: Medicare Other | Admitting: Orthopaedic Surgery

## 2020-12-15 ENCOUNTER — Encounter: Payer: Self-pay | Admitting: Orthopaedic Surgery

## 2020-12-15 VITALS — Ht 63.0 in | Wt 175.0 lb

## 2020-12-15 DIAGNOSIS — Z96652 Presence of left artificial knee joint: Secondary | ICD-10-CM

## 2020-12-15 DIAGNOSIS — Z96651 Presence of right artificial knee joint: Secondary | ICD-10-CM | POA: Diagnosis not present

## 2020-12-15 NOTE — Progress Notes (Signed)
Post-Op Visit Note   Patient: Anna Mccarthy           Date of Birth: 05-05-1956           MRN: [id number] Visit Date: 12/15/2020 PCP: [place] Pink, MD   Assessment & Plan:  Chief Complaint:  Chief Complaint  Patient presents with   Left Knee - Follow-up    Left total knee arthroplasty 11/02/2020   Visit Diagnoses:  1. Status post left knee replacement     Plan: Patient is a pleasant 65 year old female who comes in today 6 weeks out left total knee replacement, date of surgery 11/02/2020 and 4 months status post right total knee replacement, date of surgery 08/10/2020.  She has been doing very well in regards to both knees.  Minimal pain.  She has been in physical therapy making great progress.  Examination of both knees reveals range of motion from 0 to 120 degrees.  Stable valgus varus stress.  She is neurovascular intact distally.  At this point, she will continue to advance with activity as tolerated.  She would like to continue with physical therapy for the left knee to work more on strengthening exercises which is okay with me.  Dental prophylaxis reinforced.  Follow-up with [place] in 6 weeks time for repeat evaluation.  Call with concerns or questions.  Follow-Up Instructions: Return in about 6 weeks (around 01/26/2021).   Orders:  Orders Placed This Encounter  Procedures   XR Knee 1-2 Views Left   XR Knee 1-2 Views Right   No orders of the defined types were placed in this encounter.   Imaging: No results found.  PMFS History: Patient Active Problem List   Diagnosis Date Noted   Primary osteoarthritis of left knee 11/02/2020   Status post left knee replacement 11/02/2020   Status post total right knee replacement 08/10/2020   Primary osteoarthritis of right knee 01/30/2020   Chronic pain of left knee 09/11/2018   Meniscus degeneration, left 09/11/2018   Fibromyalgia 09/11/2018   Chronic pain syndrome 09/11/2018   Venous ulcer ([place]) 02/02/2017   Varicose veins of  bilateral lower extremities with pain 05/16/2016   Chronic venous insufficiency 05/16/2016   Pain in limb 05/16/2016   Swelling of limb 05/16/2016   DJD (degenerative joint disease) 05/16/2016   Past Medical History:  Diagnosis Date   Allergy    Anxiety    Arthritis    Bronchitis    Cancer ([place])    skin   Fibromyalgia    GERD (gastroesophageal reflux disease)    Hypertension    Osteoarthritis    Pneumonia     Family History  Problem Relation Age of Onset   Breast cancer Maternal Aunt    Ovarian cancer Sister    Breast cancer Cousin     Past Surgical History:  Procedure Laterality Date   ABDOMINAL HYSTERECTOMY  1996   BREAST BIOPSY Left 2011   stereo done by dr. Bary Castilla -benign   COLONOSCOPY     COLONOSCOPY WITH PROPOFOL N/A 05/30/2016   Procedure: COLONOSCOPY WITH PROPOFOL;  Surgeon: Lollie Sails, MD;  Location: [facility] ENDOSCOPY;  Service: Endoscopy;  Laterality: N/A;   DILATION AND CURETTAGE OF UTERUS     x3   HERNIA REPAIR  2012   JOINT REPLACEMENT     KNEE ARTHROSCOPY WITH MEDIAL MENISECTOMY Left 02/18/2016   Procedure: KNEE ARTHROSCOPY WITH PARTIAL MEDIAL MENISECTOMY;  Surgeon: Hessie Knows, MD;  Location: [facility] ORS;  Service: Orthopedics;  Laterality:  Left;   TOTAL KNEE ARTHROPLASTY Right 08/10/2020   Procedure: RIGHT TOTAL KNEE ARTHROPLASTY;  Surgeon: Leandrew Koyanagi, MD;  Location: [place];  Service: Orthopedics;  Laterality: Right;   TOTAL KNEE ARTHROPLASTY Left 11/02/2020   Procedure: LEFT TOTAL KNEE ARTHROPLASTY;  Surgeon: Leandrew Koyanagi, MD;  Location: [place];  Service: Orthopedics;  Laterality: Left;   TUBAL LIGATION  1984   VARICOSE VEIN SURGERY Bilateral    Social History   Occupational History   Not on file  Tobacco Use   Smoking status: Never   Smokeless tobacco: Never  Vaping Use   Vaping Use: Never used  Substance and Sexual Activity   Alcohol use: Yes    Comment: once per month   Drug use: No   Sexual activity: Not on file

## 2020-12-17 ENCOUNTER — Other Ambulatory Visit: Payer: Self-pay

## 2020-12-17 ENCOUNTER — Encounter: Payer: Self-pay | Admitting: Rehabilitative and Restorative Service Providers"

## 2020-12-17 ENCOUNTER — Ambulatory Visit (INDEPENDENT_AMBULATORY_CARE_PROVIDER_SITE_OTHER): Payer: Medicare Other | Admitting: Rehabilitative and Restorative Service Providers"

## 2020-12-17 DIAGNOSIS — R262 Difficulty in walking, not elsewhere classified: Secondary | ICD-10-CM | POA: Diagnosis not present

## 2020-12-17 DIAGNOSIS — R600 Localized edema: Secondary | ICD-10-CM

## 2020-12-17 DIAGNOSIS — R2681 Unsteadiness on feet: Secondary | ICD-10-CM

## 2020-12-17 DIAGNOSIS — M6281 Muscle weakness (generalized): Secondary | ICD-10-CM

## 2020-12-17 DIAGNOSIS — M25562 Pain in left knee: Secondary | ICD-10-CM

## 2020-12-17 NOTE — Therapy (Signed)
[facility] Physical Therapy [address] [place], [place], [address] Phone: [phone]   Fax:  [phone]  Physical Therapy Treatment  Patient Details  Name: Anna Mccarthy MRN: [id number] Date of Birth: 1955/08/24 Referring Provider (PT): Frankey Shown, MD   Encounter Date: 12/17/2020   PT End of Session - 12/17/20 1042     Visit Number 9    Number of Visits 16    Date for PT Re-Evaluation 02/12/21    Authorization Type Medicare A&B and AARP    Authorization Time Period progress note sent at 8th visit on 6/202/2022.    Progress Note Due on Visit 16    PT Start Time 1011    PT Stop Time 1051    PT Time Calculation (min) 40 min    Activity Tolerance Patient tolerated treatment well    Behavior During Therapy WFL for tasks assessed/performed             Past Medical History:  Diagnosis Date   Allergy    Anxiety    Arthritis    Bronchitis    Cancer ([place])    skin   Fibromyalgia    GERD (gastroesophageal reflux disease)    Hypertension    Osteoarthritis    Pneumonia     Past Surgical History:  Procedure Laterality Date   ABDOMINAL HYSTERECTOMY  1996   BREAST BIOPSY Left 2011   stereo done by dr. Bary Castilla -benign   COLONOSCOPY     COLONOSCOPY WITH PROPOFOL N/A 05/30/2016   Procedure: COLONOSCOPY WITH PROPOFOL;  Surgeon: Lollie Sails, MD;  Location: [facility] ENDOSCOPY;  Service: Endoscopy;  Laterality: N/A;   DILATION AND CURETTAGE OF UTERUS     x3   HERNIA REPAIR  2012   JOINT REPLACEMENT     KNEE ARTHROSCOPY WITH MEDIAL MENISECTOMY Left 02/18/2016   Procedure: KNEE ARTHROSCOPY WITH PARTIAL MEDIAL MENISECTOMY;  Surgeon: Hessie Knows, MD;  Location: [facility] ORS;  Service: Orthopedics;  Laterality: Left;   TOTAL KNEE ARTHROPLASTY Right 08/10/2020   Procedure: RIGHT TOTAL KNEE ARTHROPLASTY;  Surgeon: Leandrew Koyanagi, MD;  Location: [place];  Service: Orthopedics;  Laterality: Right;   TOTAL KNEE ARTHROPLASTY Left 11/02/2020   Procedure: LEFT TOTAL KNEE  ARTHROPLASTY;  Surgeon: Leandrew Koyanagi, MD;  Location: [place];  Service: Orthopedics;  Laterality: Left;   TUBAL LIGATION  1984   VARICOSE VEIN SURGERY Bilateral     There were no vitals filed for this visit.   Subjective Assessment - 12/17/20 1023     Subjective Pt. indicated a little stiff today upon arrival but nothing specifically painful.  Reported driving today for first time to appointment.  MD visit went well.    Pertinent History anxiety, arthritis, skin CA, fibromylagia, HTN,    Diagnostic tests X-ray    Patient Stated Goals improve strength and walking, stop hurting    Currently in Pain? No/denies    Pain Score 0-No pain    Pain Location Knee    Pain Orientation Left    Pain Descriptors / Indicators Tightness    Pain Type Surgical pain    Pain Onset 1 to 4 weeks ago    Pain Frequency Intermittent    Aggravating Factors  stairs, stiffness c sitting    Pain Relieving Factors ice, moving around                [facility] PT Assessment - 12/17/20 0001       Assessment   Medical Diagnosis left knee  arthroplasty    Referring Provider (PT) Frankey Shown, MD    Onset Date/Surgical Date 11/02/20      Observation/Other Assessments   Focus on Therapeutic Outcomes (FOTO)  update 67%                           [facility] Adult PT Treatment/Exercise - 12/17/20 0001       Neuro Re-ed    Neuro Re-ed Details  tandem ambulation fwd on foam in bars 8 ft x 10, SLS 20 seconds c min to mod hand assist corrections x 3 bilateral      Knee/Hip Exercises: Aerobic   Recumbent Bike Lvl 4 10 mins      Knee/Hip Exercises: Machines for Strengthening   Cybex Knee Extension eccentric lowering 3 x 10 10 lbs, performed bilateral    Cybex Knee Flexion single leg 15 lbs 3 x 10 bilateral      Knee/Hip Exercises: Seated   Other Seated Knee/Hip Exercises seated SLR 2 x 15 bilateral                      PT Short Term Goals - 12/14/20 1034       PT SHORT TERM GOAL #1    Title Patient demonstrates understanding of HEP for first 4 weeks.    Status Achieved      PT SHORT TERM GOAL #2   Title left knee PROM 0-100 degrees    Status Achieved               PT Long Term Goals - 12/14/20 1034       PT LONG TERM GOAL #1   Title Pt will increase FOTO score to 60.    Status On-going      PT LONG TERM GOAL #2   Title Pt will report pain </= 2/10 in left knee with ADL's.    Baseline 2/10 stiffness reported today, pt reporting her pain can increase to 5-6/10 at times depending on her activity levels.    Status On-going      PT LONG TERM GOAL #3   Title Pt will increased left knee AROM to >/= 0-110 degrees    Baseline 120 degrees, on 12/07/2020.    Status Achieved      PT LONG TERM GOAL #4   Title Pt will incrased her left knee strength to 5/5.    Baseline 4+/5 in left knee    Time 8    Period Weeks    Status On-going      PT LONG TERM GOAL #5   Title Patient ambulates >500' and negotiates ramps & curbs independently.    Baseline pt walking 3 blocks around neighborhood with increased soreness at night and some the next day.    Status Achieved      Additional Long Term Goals   Additional Long Term Goals Yes      PT LONG TERM GOAL #7   Title Pt will be able to desend 2 flights of stairs with no handrail with pain </= 3/10.    Baseline -    Time 3    Period Weeks    Status New    Target Date 01/15/21                   Plan - 12/17/20 1037     Clinical Impression Statement Due to complaints of difficulty c stairs, squatting in functional activity at  home, focus on inclusion of eccentric control in open and closed chain activity for quad strength bilaterally.  FOTO reassessment showed good improvement as documented.  Continued skilled PT services to help improve patient function.    Personal Factors and Comorbidities Comorbidity 3+    Comorbidities anxiety, arthritis, skin CA, fibromylagia, HTN    Examination-Activity Limitations  Lift;Locomotion Level;Squat;Stairs;Stand;Transfers    Examination-Participation Restrictions Community Activity;Driving;Yard Work;Other;Shop    Stability/Clinical Decision Making Stable/Uncomplicated    Rehab Potential Good    PT Frequency 2x / week    PT Duration 8 weeks    PT Treatment/Interventions ADLs/Self Care Home Management;Electrical Stimulation;Cryotherapy;DME Instruction;Gait training;Stair training;Functional mobility training;Therapeutic activities;Therapeutic exercise;Balance training;Neuromuscular re-education;Patient/family education;Manual techniques;Scar mobilization;Passive range of motion;Dry needling;Taping;Vasopneumatic Device;Vestibular;Joint Manipulations    PT Next Visit Plan Eccentric loading continued, dynamic and compliant surface balance to improve movement coordination.    PT Home Exercise Plan Access Code: [email or web address] (continue previous HEP)    Consulted and Agree with Plan of Care Patient             Patient will benefit from skilled therapeutic intervention in order to improve the following deficits and impairments:  Abnormal gait, Decreased activity tolerance, Decreased balance, Decreased mobility, Decreased range of motion, Decreased scar mobility, Decreased skin integrity, Decreased strength, Increased edema, Impaired flexibility, Pain  Visit Diagnosis: Muscle weakness (generalized)  Acute pain of left knee  Localized edema  Difficulty in walking, not elsewhere classified  Unsteadiness on feet     Problem List Patient Active Problem List   Diagnosis Date Noted   Primary osteoarthritis of left knee 11/02/2020   Status post left knee replacement 11/02/2020   Status post total right knee replacement 08/10/2020   Primary osteoarthritis of right knee 01/30/2020   Chronic pain of left knee 09/11/2018   Meniscus degeneration, left 09/11/2018   Fibromyalgia 09/11/2018   Chronic pain syndrome 09/11/2018   Venous ulcer ([place]) 02/02/2017   Varicose  veins of bilateral lower extremities with pain 05/16/2016   Chronic venous insufficiency 05/16/2016   Pain in limb 05/16/2016   Swelling of limb 05/16/2016   DJD (degenerative joint disease) 05/16/2016    Scot Jun, PT, DPT, OCS, ATC 12/17/20  10:51 AM    [facility] Physical Therapy [address] [place], [place], [address] Phone: [phone]   Fax:  [phone]  Name: ATIYA YERA MRN: [id number] Date of Birth: [birth year]

## 2020-12-17 NOTE — Addendum Note (Signed)
Addended by: Scot Jun B on: 12/17/2020 11:19 AM   Modules accepted: Orders

## 2020-12-21 ENCOUNTER — Other Ambulatory Visit: Payer: Self-pay

## 2020-12-21 ENCOUNTER — Encounter: Payer: Self-pay | Admitting: Physical Therapy

## 2020-12-21 ENCOUNTER — Ambulatory Visit (INDEPENDENT_AMBULATORY_CARE_PROVIDER_SITE_OTHER): Payer: Medicare Other | Admitting: Physical Therapy

## 2020-12-21 DIAGNOSIS — M25562 Pain in left knee: Secondary | ICD-10-CM | POA: Diagnosis not present

## 2020-12-21 DIAGNOSIS — M6281 Muscle weakness (generalized): Secondary | ICD-10-CM | POA: Diagnosis not present

## 2020-12-21 DIAGNOSIS — R2681 Unsteadiness on feet: Secondary | ICD-10-CM

## 2020-12-21 DIAGNOSIS — R262 Difficulty in walking, not elsewhere classified: Secondary | ICD-10-CM | POA: Diagnosis not present

## 2020-12-21 DIAGNOSIS — R600 Localized edema: Secondary | ICD-10-CM

## 2020-12-21 NOTE — Therapy (Signed)
[facility] Physical Therapy [address] [place], [place], [address] Phone: [phone]   Fax:  [phone]  Physical Therapy Treatment  Patient Details  Name: Anna Mccarthy MRN: [id number] Date of Birth: [birth year] Referring Provider (PT): Frankey Shown, MD   Encounter Date: 12/21/2020   PT End of Session - 12/21/20 1115     Visit Number 10    Number of Visits 16    Date for PT Re-Evaluation 02/12/21    Authorization Type Medicare A&B and AARP    Authorization Time Period progress note sent at 8th visit on 12/14/2020.    Progress Note Due on Visit 18    PT Start Time 1010    PT Stop Time 1048    PT Time Calculation (min) 38 min    Activity Tolerance Patient tolerated treatment well    Behavior During Therapy WFL for tasks assessed/performed             Past Medical History:  Diagnosis Date   Allergy    Anxiety    Arthritis    Bronchitis    Cancer ([place])    skin   Fibromyalgia    GERD (gastroesophageal reflux disease)    Hypertension    Osteoarthritis    Pneumonia     Past Surgical History:  Procedure Laterality Date   ABDOMINAL HYSTERECTOMY  1996   BREAST BIOPSY Left 2011   stereo done by dr. Bary Castilla -benign   COLONOSCOPY     COLONOSCOPY WITH PROPOFOL N/A 05/30/2016   Procedure: COLONOSCOPY WITH PROPOFOL;  Surgeon: Lollie Sails, MD;  Location: [facility] ENDOSCOPY;  Service: Endoscopy;  Laterality: N/A;   DILATION AND CURETTAGE OF UTERUS     x3   HERNIA REPAIR  2012   JOINT REPLACEMENT     KNEE ARTHROSCOPY WITH MEDIAL MENISECTOMY Left 02/18/2016   Procedure: KNEE ARTHROSCOPY WITH PARTIAL MEDIAL MENISECTOMY;  Surgeon: Hessie Knows, MD;  Location: [facility] ORS;  Service: Orthopedics;  Laterality: Left;   TOTAL KNEE ARTHROPLASTY Right 08/10/2020   Procedure: RIGHT TOTAL KNEE ARTHROPLASTY;  Surgeon: Leandrew Koyanagi, MD;  Location: [place];  Service: Orthopedics;  Laterality: Right;   TOTAL KNEE ARTHROPLASTY Left 11/02/2020   Procedure: LEFT TOTAL KNEE  ARTHROPLASTY;  Surgeon: Leandrew Koyanagi, MD;  Location: [place];  Service: Orthopedics;  Laterality: Left;   TUBAL LIGATION  1984   VARICOSE VEIN SURGERY Bilateral     There were no vitals filed for this visit.   Subjective Assessment - 12/21/20 1007     Subjective knees are a little sore but otherwise no complaints    Pertinent History anxiety, arthritis, skin CA, fibromylagia, HTN,    Diagnostic tests X-ray    Patient Stated Goals improve strength and walking, stop hurting    Currently in Pain? No/denies                               [facility] Adult PT Treatment/Exercise - 12/21/20 1010       Knee/Hip Exercises: Aerobic   Recumbent Bike L8 x 8 min      Knee/Hip Exercises: Machines for Strengthening   Cybex Knee Extension eccentric lowering 3 x 10 10 lbs, performed bilateral    Cybex Knee Flexion single leg 15 lbs 3 x 10 bilateral      Knee/Hip Exercises: Standing   Step Down Both;3 sets;10 reps;Hand Hold: 1;Step Height: 4"    Step Down Limitations lateral heel tap  down    Functional Squat 3 sets;10 reps;5 seconds    Functional Squat Limitations TRX                      PT Short Term Goals - 12/14/20 1034       PT SHORT TERM GOAL #1   Title Patient demonstrates understanding of HEP for first 4 weeks.    Status Achieved      PT SHORT TERM GOAL #2   Title left knee PROM 0-100 degrees    Status Achieved               PT Long Term Goals - 12/14/20 1034       PT LONG TERM GOAL #1   Title Pt will increase FOTO score to 60.    Status On-going      PT LONG TERM GOAL #2   Title Pt will report pain </= 2/10 in left knee with ADL's.    Baseline 2/10 stiffness reported today, pt reporting her pain can increase to 5-6/10 at times depending on her activity levels.    Status On-going      PT LONG TERM GOAL #3   Title Pt will increased left knee AROM to >/= 0-110 degrees    Baseline 120 degrees, on 12/07/2020.    Status Achieved      PT  LONG TERM GOAL #4   Title Pt will incrased her left knee strength to 5/5.    Baseline 4+/5 in left knee    Time 8    Period Weeks    Status On-going      PT LONG TERM GOAL #5   Title Patient ambulates >500' and negotiates ramps & curbs independently.    Baseline pt walking 3 blocks around neighborhood with increased soreness at night and some the next day.    Status Achieved      Additional Long Term Goals   Additional Long Term Goals Yes      PT LONG TERM GOAL #7   Title Pt will be able to desend 2 flights of stairs with no handrail with pain </= 3/10.    Baseline -    Time 3    Period Weeks    Status New    Target Date 01/15/21                   Plan - 12/21/20 1116     Clinical Impression Statement Continued focus on eccentric quad strengthening today as well as endurance and balance activities.  Continues to progress well with PT at this time.  Will continue to benefit from PT to maximize function.    Personal Factors and Comorbidities Comorbidity 3+    Comorbidities anxiety, arthritis, skin CA, fibromylagia, HTN    Examination-Activity Limitations Lift;Locomotion Level;Squat;Stairs;Stand;Transfers    Examination-Participation Restrictions Community Activity;Driving;Yard Work;Other;Shop    Stability/Clinical Decision Making Stable/Uncomplicated    Rehab Potential Good    PT Frequency 2x / week    PT Duration 8 weeks    PT Treatment/Interventions ADLs/Self Care Home Management;Electrical Stimulation;Cryotherapy;DME Instruction;Gait training;Stair training;Functional mobility training;Therapeutic activities;Therapeutic exercise;Balance training;Neuromuscular re-education;Patient/family education;Manual techniques;Scar mobilization;Passive range of motion;Dry needling;Taping;Vasopneumatic Device;Vestibular;Joint Manipulations    PT Next Visit Plan Eccentric loading continued, dynamic and compliant surface balance to improve movement coordination.    PT Home Exercise  Plan Access Code: [email or web address] (continue previous HEP)    Consulted and Agree with Plan of Care Patient  Patient will benefit from skilled therapeutic intervention in order to improve the following deficits and impairments:  Abnormal gait, Decreased activity tolerance, Decreased balance, Decreased mobility, Decreased range of motion, Decreased scar mobility, Decreased skin integrity, Decreased strength, Increased edema, Impaired flexibility, Pain  Visit Diagnosis: Acute pain of left knee  Muscle weakness (generalized)  Localized edema  Difficulty in walking, not elsewhere classified  Unsteadiness on feet     Problem List Patient Active Problem List   Diagnosis Date Noted   Primary osteoarthritis of left knee 11/02/2020   Status post left knee replacement 11/02/2020   Status post total right knee replacement 08/10/2020   Primary osteoarthritis of right knee 01/30/2020   Chronic pain of left knee 09/11/2018   Meniscus degeneration, left 09/11/2018   Fibromyalgia 09/11/2018   Chronic pain syndrome 09/11/2018   Venous ulcer ([place]) 02/02/2017   Varicose veins of bilateral lower extremities with pain 05/16/2016   Chronic venous insufficiency 05/16/2016   Pain in limb 05/16/2016   Swelling of limb 05/16/2016   DJD (degenerative joint disease) 05/16/2016      Laureen Abrahams, PT, DPT 12/21/20 11:18 AM      [facility] Physical Therapy [address] [place], [place], [address] Phone: [phone]   Fax:  [phone]  Name: Anna Mccarthy MRN: [id number] Date of Birth: 07-02-1955

## 2020-12-24 ENCOUNTER — Other Ambulatory Visit: Payer: Self-pay

## 2020-12-24 ENCOUNTER — Ambulatory Visit (INDEPENDENT_AMBULATORY_CARE_PROVIDER_SITE_OTHER): Payer: Medicare Other | Admitting: Physical Therapy

## 2020-12-24 DIAGNOSIS — M6281 Muscle weakness (generalized): Secondary | ICD-10-CM

## 2020-12-24 DIAGNOSIS — R600 Localized edema: Secondary | ICD-10-CM | POA: Diagnosis not present

## 2020-12-24 DIAGNOSIS — R262 Difficulty in walking, not elsewhere classified: Secondary | ICD-10-CM | POA: Diagnosis not present

## 2020-12-24 DIAGNOSIS — R2681 Unsteadiness on feet: Secondary | ICD-10-CM

## 2020-12-24 DIAGNOSIS — M25562 Pain in left knee: Secondary | ICD-10-CM

## 2020-12-24 NOTE — Therapy (Signed)
[facility] Physical Therapy [address] [place], [place], [address] Phone: [phone]   Fax:  [phone]  Physical Therapy Treatment  Patient Details  Name: Anna Mccarthy MRN: [id number] Date of Birth: 1956-03-05 Referring Provider (PT): Frankey Shown, MD   Encounter Date: 12/24/2020   PT End of Session - 12/24/20 1214     Visit Number 11    Number of Visits 16    Date for PT Re-Evaluation 02/12/21    Authorization Type Medicare A&B and [employer]    Authorization Time Period progress note sent at 8th visit on 12/14/2020.    Progress Note Due on Visit 18    PT Start Time 1138    PT Stop Time 1216    PT Time Calculation (min) 38 min    Activity Tolerance Patient tolerated treatment well    Behavior During Therapy WFL for tasks assessed/performed             Past Medical History:  Diagnosis Date   Allergy    Anxiety    Arthritis    Bronchitis    Cancer ([place])    skin   Fibromyalgia    GERD (gastroesophageal reflux disease)    Hypertension    Osteoarthritis    Pneumonia     Past Surgical History:  Procedure Laterality Date   ABDOMINAL HYSTERECTOMY  1996   BREAST BIOPSY Left 2011   stereo done by dr. Bary Castilla -benign   COLONOSCOPY     COLONOSCOPY WITH PROPOFOL N/A 05/30/2016   Procedure: COLONOSCOPY WITH PROPOFOL;  Surgeon: Lollie Sails, MD;  Location: [facility] ENDOSCOPY;  Service: Endoscopy;  Laterality: N/A;   DILATION AND CURETTAGE OF UTERUS     x3   HERNIA REPAIR  2012   JOINT REPLACEMENT     KNEE ARTHROSCOPY WITH MEDIAL MENISECTOMY Left 02/18/2016   Procedure: KNEE ARTHROSCOPY WITH PARTIAL MEDIAL MENISECTOMY;  Surgeon: Hessie Knows, MD;  Location: [facility] ORS;  Service: Orthopedics;  Laterality: Left;   TOTAL KNEE ARTHROPLASTY Right 08/10/2020   Procedure: RIGHT TOTAL KNEE ARTHROPLASTY;  Surgeon: Leandrew Koyanagi, MD;  Location: [place];  Service: Orthopedics;  Laterality: Right;   TOTAL KNEE ARTHROPLASTY Left 11/02/2020   Procedure: LEFT TOTAL KNEE  ARTHROPLASTY;  Surgeon: Leandrew Koyanagi, MD;  Location: [place];  Service: Orthopedics;  Laterality: Left;   TUBAL LIGATION  1984   VARICOSE VEIN SURGERY Bilateral     There were no vitals filed for this visit.      [facility] Adult PT Treatment/Exercise - 12/24/20 0001       Neuro Re-ed    Neuro Re-ed Details  tandem walk, march walking      Knee/Hip Exercises: Stretches   Press photographer Both;30 seconds;2 reps    Press photographer Limitations slant board    Other Knee/Hip Stretches quadriped knee flexion stretch 10 sec X5 on low mat table      Knee/Hip Exercises: Aerobic   Recumbent Bike L8 x 8 min      Knee/Hip Exercises: Machines for Strengthening   Cybex Knee Extension eccentric lowering 3 x 10 10 lbs, performed bilateral    Cybex Leg Press bilateral LE"s 112# 3X10,  then SL only: 56 # 2x15 on both      Knee/Hip Exercises: Standing   Other Standing Knee Exercises TRX squats 2x10 with 3 sec holds, TRX lunges X10 bilat 2 sec hold  PT Short Term Goals - 12/14/20 1034       PT SHORT TERM GOAL #1   Title Patient demonstrates understanding of HEP for first 4 weeks.    Status Achieved      PT SHORT TERM GOAL #2   Title left knee PROM 0-100 degrees    Status Achieved               PT Long Term Goals - 12/14/20 1034       PT LONG TERM GOAL #1   Title Pt will increase FOTO score to 60.    Status On-going      PT LONG TERM GOAL #2   Title Pt will report pain </= 2/10 in left knee with ADL's.    Baseline 2/10 stiffness reported today, pt reporting her pain can increase to 5-6/10 at times depending on her activity levels.    Status On-going      PT LONG TERM GOAL #3   Title Pt will increased left knee AROM to >/= 0-110 degrees    Baseline 120 degrees, on 12/07/2020.    Status Achieved      PT LONG TERM GOAL #4   Title Pt will incrased her left knee strength to 5/5.    Baseline 4+/5 in left knee    Time 8    Period Weeks    Status  On-going      PT LONG TERM GOAL #5   Title Patient ambulates >500' and negotiates ramps & curbs independently.    Baseline pt walking 3 blocks around neighborhood with increased soreness at night and some the next day.    Status Achieved      Additional Long Term Goals   Additional Long Term Goals Yes      PT LONG TERM GOAL #7   Title Pt will be able to desend 2 flights of stairs with no handrail with pain </= 3/10.    Baseline -    Time 3    Period Weeks    Status New    Target Date 01/15/21                   Plan - 12/24/20 1215     Clinical Impression Statement She continues to make good progress with PT but does still lack overall quad strength and dynamic balance. Continued to work to improve these today with session. PT recommending to continue POC.    Personal Factors and Comorbidities Comorbidity 3+    Comorbidities anxiety, arthritis, skin CA, fibromylagia, HTN    Examination-Activity Limitations Lift;Locomotion Level;Squat;Stairs;Stand;Transfers    Examination-Participation Restrictions Community Activity;Driving;Yard Work;Other;Shop    Stability/Clinical Decision Making Stable/Uncomplicated    Rehab Potential Good    PT Frequency 2x / week    PT Duration 8 weeks    PT Treatment/Interventions ADLs/Self Care Home Management;Electrical Stimulation;Cryotherapy;DME Instruction;Gait training;Stair training;Functional mobility training;Therapeutic activities;Therapeutic exercise;Balance training;Neuromuscular re-education;Patient/family education;Manual techniques;Scar mobilization;Passive range of motion;Dry needling;Taping;Vasopneumatic Device;Vestibular;Joint Manipulations    PT Next Visit Plan Eccentric loading continued, dynamic and compliant surface balance to improve movement coordination.    PT Home Exercise Plan Access Code: [email or web address] (continue previous HEP)    Consulted and Agree with Plan of Care Patient             Patient will benefit from skilled  therapeutic intervention in order to improve the following deficits and impairments:  Abnormal gait, Decreased activity tolerance, Decreased balance, Decreased mobility, Decreased range of motion, Decreased scar mobility, Decreased skin  integrity, Decreased strength, Increased edema, Impaired flexibility, Pain  Visit Diagnosis: Acute pain of left knee  Muscle weakness (generalized)  Localized edema  Difficulty in walking, not elsewhere classified  Unsteadiness on feet     Problem List Patient Active Problem List   Diagnosis Date Noted   Primary osteoarthritis of left knee 11/02/2020   Status post left knee replacement 11/02/2020   Status post total right knee replacement 08/10/2020   Primary osteoarthritis of right knee 01/30/2020   Chronic pain of left knee 09/11/2018   Meniscus degeneration, left 09/11/2018   Fibromyalgia 09/11/2018   Chronic pain syndrome 09/11/2018   Venous ulcer ([place]) 02/02/2017   Varicose veins of bilateral lower extremities with pain 05/16/2016   Chronic venous insufficiency 05/16/2016   Pain in limb 05/16/2016   Swelling of limb 05/16/2016   DJD (degenerative joint disease) 05/16/2016    Silvestre Mesi 12/24/2020, 12:16 PM  [facility] Physical Therapy [address] [place], [place], [address] Phone: [phone]   Fax:  [phone]  Name: MARIANGEL RINGLEY MRN: [id number] Date of Birth: Nov 01, 1955

## 2020-12-29 ENCOUNTER — Encounter: Payer: Medicare Other | Admitting: Physical Therapy

## 2020-12-31 ENCOUNTER — Encounter: Payer: Medicare Other | Admitting: Rehabilitative and Restorative Service Providers"

## 2021-01-04 ENCOUNTER — Encounter: Payer: Self-pay | Admitting: Physical Therapy

## 2021-01-04 ENCOUNTER — Ambulatory Visit (INDEPENDENT_AMBULATORY_CARE_PROVIDER_SITE_OTHER): Payer: Medicare Other | Admitting: Physical Therapy

## 2021-01-04 ENCOUNTER — Other Ambulatory Visit: Payer: Self-pay

## 2021-01-04 DIAGNOSIS — R600 Localized edema: Secondary | ICD-10-CM | POA: Diagnosis not present

## 2021-01-04 DIAGNOSIS — R262 Difficulty in walking, not elsewhere classified: Secondary | ICD-10-CM | POA: Diagnosis not present

## 2021-01-04 DIAGNOSIS — R2689 Other abnormalities of gait and mobility: Secondary | ICD-10-CM

## 2021-01-04 DIAGNOSIS — M6281 Muscle weakness (generalized): Secondary | ICD-10-CM

## 2021-01-04 DIAGNOSIS — R2681 Unsteadiness on feet: Secondary | ICD-10-CM

## 2021-01-04 DIAGNOSIS — M25561 Pain in right knee: Secondary | ICD-10-CM

## 2021-01-04 DIAGNOSIS — M25562 Pain in left knee: Secondary | ICD-10-CM

## 2021-01-04 DIAGNOSIS — M25661 Stiffness of right knee, not elsewhere classified: Secondary | ICD-10-CM

## 2021-01-04 NOTE — Therapy (Signed)
[facility] Physical Therapy [address] [place], [place], [address] Phone: [phone]   Fax:  [phone]  Physical Therapy Treatment  Patient Details  Name: Anna Mccarthy MRN: [id number] Date of Birth: 12/21/1955 Referring Provider (PT): Frankey Shown, MD   Encounter Date: 01/04/2021   PT End of Session - 01/04/21 1028     Visit Number 12    Number of Visits 16    Date for PT Re-Evaluation 02/12/21    Authorization Type Medicare A&B and [employer]    Authorization Time Period progress note sent at 8th visit on 12/14/2020.    Progress Note Due on Visit 18    PT Start Time 1015    PT Stop Time 1055    PT Time Calculation (min) 40 min    Activity Tolerance Patient tolerated treatment well    Behavior During Therapy WFL for tasks assessed/performed             Past Medical History:  Diagnosis Date   Allergy    Anxiety    Arthritis    Bronchitis    Cancer ([place])    skin   Fibromyalgia    GERD (gastroesophageal reflux disease)    Hypertension    Osteoarthritis    Pneumonia     Past Surgical History:  Procedure Laterality Date   ABDOMINAL HYSTERECTOMY  1996   BREAST BIOPSY Left 2011   stereo done by dr. Bary Castilla -benign   COLONOSCOPY     COLONOSCOPY WITH PROPOFOL N/A 05/30/2016   Procedure: COLONOSCOPY WITH PROPOFOL;  Surgeon: Lollie Sails, MD;  Location: [facility] ENDOSCOPY;  Service: Endoscopy;  Laterality: N/A;   DILATION AND CURETTAGE OF UTERUS     x3   HERNIA REPAIR  2012   JOINT REPLACEMENT     KNEE ARTHROSCOPY WITH MEDIAL MENISECTOMY Left 02/18/2016   Procedure: KNEE ARTHROSCOPY WITH PARTIAL MEDIAL MENISECTOMY;  Surgeon: Hessie Knows, MD;  Location: [facility] ORS;  Service: Orthopedics;  Laterality: Left;   TOTAL KNEE ARTHROPLASTY Right 08/10/2020   Procedure: RIGHT TOTAL KNEE ARTHROPLASTY;  Surgeon: Leandrew Koyanagi, MD;  Location: [place];  Service: Orthopedics;  Laterality: Right;   TOTAL KNEE ARTHROPLASTY Left 11/02/2020   Procedure: LEFT TOTAL KNEE  ARTHROPLASTY;  Surgeon: Leandrew Koyanagi, MD;  Location: [place];  Service: Orthopedics;  Laterality: Left;   TUBAL LIGATION  1984   VARICOSE VEIN SURGERY Bilateral     There were no vitals filed for this visit.   Subjective Assessment - 01/04/21 1026     Subjective Pt reporting soreness 1/10 pain today. Pt reporting lateral pain    Pertinent History anxiety, arthritis, skin CA, fibromylagia, HTN,    Diagnostic tests X-ray    Patient Stated Goals improve strength and walking, stop hurting    Currently in Pain? Yes    Pain Score 1     Pain Location Knee    Pain Orientation Left    Pain Descriptors / Indicators Tightness;Sore    Pain Type Surgical pain    Pain Onset More than a month ago    Pain Frequency Intermittent                               [facility] Adult PT Treatment/Exercise - 01/04/21 0001       Neuro Re-ed    Neuro Re-ed Details  tandum walking, forward and then backward walking, side stepping, vectors "y" x 10 each, SLS 3x 20  seconds on each LE with intermittent UE support      Knee/Hip Exercises: Stretches   Press photographer Both;30 seconds;2 reps    Gastroc Stretch Limitations slant board    Other Knee/Hip Stretches quad and hip flexor stretch in lunge position in standing      Knee/Hip Exercises: Aerobic   Recumbent Bike L8 x 9 minutes      Knee/Hip Exercises: Machines for Strengthening   Cybex Knee Extension eccentric lowering 3 x 10 10 lbs, performed bilateral    Cybex Leg Press bilateral LE"s 112# 3X10,  then SL only: 62 # 2x15 on both      Knee/Hip Exercises: Standing   Other Standing Knee Exercises TRX squats 2x10 with 3 sec holds, TRX lunges X10 bilat 2 sec hold                      PT Short Term Goals - 01/04/21 1035       PT SHORT TERM GOAL #1   Title Patient demonstrates understanding of HEP for first 4 weeks.    Status Achieved      PT SHORT TERM GOAL #2   Title left knee PROM 0-100 degrees    Status Achieved                PT Long Term Goals - 01/04/21 1035       PT LONG TERM GOAL #1   Title Pt will increase FOTO score to 60.    Status On-going      PT LONG TERM GOAL #2   Title Pt will report pain </= 2/10 in left knee with ADL's.    Baseline 1/10 pain reported today after reporting a busy weekend and then resting on Sunday.    Status Partially Met      PT LONG TERM GOAL #3   Title Pt will increased left knee AROM to >/= 0-110 degrees    Status Achieved      PT LONG TERM GOAL #4   Title Pt will incrased her left knee strength to 5/5.    Status On-going      PT LONG TERM GOAL #5   Title Patient ambulates >500' and negotiates ramps & curbs independently.    Baseline pt walking 3 blocks around neighborhood with increased soreness at night and some the next day.    Status Achieved      PT LONG TERM GOAL #7   Title Pt will be able to desend 2 flights of stairs with no handrail with pain </= 3/10.    Status On-going                   Plan - 01/04/21 1029     Clinical Impression Statement Pt is making progress toward LTG"s set at initial evaluation. Pt focusing on quad strength and dynamic balance activities. Continue skilled PT to maximize function.    Personal Factors and Comorbidities Comorbidity 3+    Comorbidities anxiety, arthritis, skin CA, fibromylagia, HTN    Examination-Activity Limitations Lift;Locomotion Level;Squat;Stairs;Stand;Transfers    Examination-Participation Restrictions Community Activity;Driving;Yard Work;Other;Shop    Stability/Clinical Decision Making Stable/Uncomplicated    Rehab Potential Good    PT Duration 8 weeks    PT Treatment/Interventions ADLs/Self Care Home Management;Electrical Stimulation;Cryotherapy;DME Instruction;Gait training;Stair training;Functional mobility training;Therapeutic activities;Therapeutic exercise;Balance training;Neuromuscular re-education;Patient/family education;Manual techniques;Scar mobilization;Passive range of  motion;Dry needling;Taping;Vasopneumatic Device;Vestibular;Joint Manipulations    PT Next Visit Plan Eccentric loading continued, dynamic balance    PT Home  Exercise Plan Access Code: [email or web address] (continue previous HEP)    Consulted and Agree with Plan of Care Patient             Patient will benefit from skilled therapeutic intervention in order to improve the following deficits and impairments:  Abnormal gait, Decreased activity tolerance, Decreased balance, Decreased mobility, Decreased range of motion, Decreased scar mobility, Decreased skin integrity, Decreased strength, Increased edema, Impaired flexibility, Pain  Visit Diagnosis: Acute pain of left knee  Muscle weakness (generalized)  Localized edema  Difficulty in walking, not elsewhere classified  Unsteadiness on feet  Acute pain of right knee  Stiffness of right knee, not elsewhere classified  Other abnormalities of gait and mobility     Problem List Patient Active Problem List   Diagnosis Date Noted   Primary osteoarthritis of left knee 11/02/2020   Status post left knee replacement 11/02/2020   Status post total right knee replacement 08/10/2020   Primary osteoarthritis of right knee 01/30/2020   Chronic pain of left knee 09/11/2018   Meniscus degeneration, left 09/11/2018   Fibromyalgia 09/11/2018   Chronic pain syndrome 09/11/2018   Venous ulcer ([place]) 02/02/2017   Varicose veins of bilateral lower extremities with pain 05/16/2016   Chronic venous insufficiency 05/16/2016   Pain in limb 05/16/2016   Swelling of limb 05/16/2016   DJD (degenerative joint disease) 05/16/2016    Oretha Caprice, PT, MPT 01/04/2021, 10:44 AM  [facility] Physical Therapy [address] [place], [place], [address] Phone: [phone]   Fax:  [phone]  Name: Anna Mccarthy MRN: [id number] Date of Birth: 27-Feb-1956

## 2021-01-07 ENCOUNTER — Encounter: Payer: Self-pay | Admitting: Physical Therapy

## 2021-01-07 ENCOUNTER — Other Ambulatory Visit: Payer: Self-pay

## 2021-01-07 ENCOUNTER — Ambulatory Visit (INDEPENDENT_AMBULATORY_CARE_PROVIDER_SITE_OTHER): Payer: Medicare Other | Admitting: Physical Therapy

## 2021-01-07 DIAGNOSIS — M25562 Pain in left knee: Secondary | ICD-10-CM

## 2021-01-07 DIAGNOSIS — R262 Difficulty in walking, not elsewhere classified: Secondary | ICD-10-CM | POA: Diagnosis not present

## 2021-01-07 DIAGNOSIS — R600 Localized edema: Secondary | ICD-10-CM

## 2021-01-07 DIAGNOSIS — M6281 Muscle weakness (generalized): Secondary | ICD-10-CM | POA: Diagnosis not present

## 2021-01-07 DIAGNOSIS — R2681 Unsteadiness on feet: Secondary | ICD-10-CM

## 2021-01-07 NOTE — Therapy (Signed)
[facility] Physical Therapy [address] [place], [place], [address] Phone: [phone]   Fax:  [phone]  Physical Therapy Treatment  Patient Details  Name: Anna Mccarthy MRN: [id number] Date of Birth: 1956/01/06 Referring Provider (PT): Frankey Shown, MD   Encounter Date: 01/07/2021   PT End of Session - 01/07/21 1124     Visit Number 13    Number of Visits 16    Date for PT Re-Evaluation 02/12/21    Authorization Type Medicare A&B and [employer]ARP    Authorization Time Period progress note sent at 8th visit on 12/14/2020.    Progress Note Due on Visit 18    PT Start Time 1015    PT Stop Time 1100    PT Time Calculation (min) 45 min    Activity Tolerance Patient tolerated treatment well    Behavior During Therapy WFL for tasks assessed/performed             Past Medical History:  Diagnosis Date   Allergy    Anxiety    Arthritis    Bronchitis    Cancer ([place])    skin   Fibromyalgia    GERD (gastroesophageal reflux disease)    Hypertension    Osteoarthritis    Pneumonia     Past Surgical History:  Procedure Laterality Date   ABDOMINAL HYSTERECTOMY  1996   BREAST BIOPSY Left 2011   stereo done by dr. Bary Castilla -benign   COLONOSCOPY     COLONOSCOPY WITH PROPOFOL N/A 05/30/2016   Procedure: COLONOSCOPY WITH PROPOFOL;  Surgeon: Lollie Sails, MD;  Location: [facility] ENDOSCOPY;  Service: Endoscopy;  Laterality: N/A;   DILATION AND CURETTAGE OF UTERUS     x3   HERNIA REPAIR  2012   JOINT REPLACEMENT     KNEE ARTHROSCOPY WITH MEDIAL MENISECTOMY Left 02/18/2016   Procedure: KNEE ARTHROSCOPY WITH PARTIAL MEDIAL MENISECTOMY;  Surgeon: Hessie Knows, MD;  Location: [facility] ORS;  Service: Orthopedics;  Laterality: Left;   TOTAL KNEE ARTHROPLASTY Right 08/10/2020   Procedure: RIGHT TOTAL KNEE ARTHROPLASTY;  Surgeon: Leandrew Koyanagi, MD;  Location: [place];  Service: Orthopedics;  Laterality: Right;   TOTAL KNEE ARTHROPLASTY Left 11/02/2020   Procedure: LEFT TOTAL KNEE  ARTHROPLASTY;  Surgeon: Leandrew Koyanagi, MD;  Location: [place];  Service: Orthopedics;  Laterality: Left;   TUBAL LIGATION  1984   VARICOSE VEIN SURGERY Bilateral     There were no vitals filed for this visit.   Subjective Assessment - 01/07/21 1019     Subjective Pt reporting soreness 3/10 overall in both knees from increased activity shopping and also all the rain has been making both knees throb.    Pertinent History anxiety, arthritis, skin CA, fibromylagia, HTN,    Diagnostic tests X-ray    Patient Stated Goals improve strength and walking, stop hurting    Pain Onset More than a month ago                [facility] PT Assessment - 01/07/21 0001       AROM   Left Knee Extension 2    Left Knee Flexion 120      Strength   Right Knee Flexion 5/5    Right Knee Extension 5/5    Left Knee Flexion 4+/5    Left Knee Extension 4+/5                           [facility] Adult PT  Treatment/Exercise - 01/07/21 0001       Neuro Re-ed    Neuro Re-ed Details  fwd walking and lateral walkin on foam beam 4 round trips, static balance on foam beam with perturbations/nudges from PT, walking with eyes closed 3 round trips      Knee/Hip Exercises: Stretches   Active Hamstring Stretch Right;Left;3 reps;30 seconds    Active Hamstring Stretch Limitations 2 reps on Rt, 3 on left standing    Gastroc Stretch Both;3 reps;30 seconds    Gastroc Stretch Limitations slant board      Knee/Hip Exercises: Aerobic   Recumbent Bike L8 x 10 minutes      Knee/Hip Exercises: Machines for Strengthening   Cybex Knee Extension DL 3 x 10 15# slow lower    Cybex Knee Flexion 25# 3X10 DL      Knee/Hip Exercises: Standing   Forward Step Up Limitations stepping up with left and down with right anteriorly 6 inch step X 10 no UE support. Then step ups with alt leg march no UE support 6 inch, X 10 bilat    Other Standing Knee Exercises TRX squats 2x10 with 5 sec holds, TRX lunges X10 bilat 2-3 sec hold                       PT Short Term Goals - 01/04/21 1035       PT SHORT TERM GOAL #1   Title Patient demonstrates understanding of HEP for first 4 weeks.    Status Achieved      PT SHORT TERM GOAL #2   Title left knee PROM 0-100 degrees    Status Achieved               PT Long Term Goals - 01/04/21 1035       PT LONG TERM GOAL #1   Title Pt will increase FOTO score to 60.    Status On-going      PT LONG TERM GOAL #2   Title Pt will report pain </= 2/10 in left knee with ADL's.    Baseline 1/10 pain reported today after reporting a busy weekend and then resting on Sunday.    Status Partially Met      PT LONG TERM GOAL #3   Title Pt will increased left knee AROM to >/= 0-110 degrees    Status Achieved      PT LONG TERM GOAL #4   Title Pt will incrased her left knee strength to 5/5.    Status On-going      PT LONG TERM GOAL #5   Title Patient ambulates >500' and negotiates ramps & curbs independently.    Baseline pt walking 3 blocks around neighborhood with increased soreness at night and some the next day.    Status Achieved      PT LONG TERM GOAL #7   Title Pt will be able to desend 2 flights of stairs with no handrail with pain </= 3/10.    Status On-going                   Plan - 01/07/21 1124     Clinical Impression Statement She is overall progressing well but does still lack mild knee extension ROM and overall quad strength and balance but these are only mild deficits now. We will continue to work to improve these as able.    Personal Factors and Comorbidities Comorbidity 3+    Comorbidities anxiety, arthritis, skin  CA, fibromylagia, HTN    Examination-Activity Limitations Lift;Locomotion Level;Squat;Stairs;Stand;Transfers    Examination-Participation Restrictions Community Activity;Driving;Yard Work;Other;Shop    Stability/Clinical Decision Making Stable/Uncomplicated    Rehab Potential Good    PT Duration 8 weeks    PT  Treatment/Interventions ADLs/Self Care Home Management;Electrical Stimulation;Cryotherapy;DME Instruction;Gait training;Stair training;Functional mobility training;Therapeutic activities;Therapeutic exercise;Balance training;Neuromuscular re-education;Patient/family education;Manual techniques;Scar mobilization;Passive range of motion;Dry needling;Taping;Vasopneumatic Device;Vestibular;Joint Manipulations    PT Next Visit Plan Eccentric loading continued, dynamic balance    PT Home Exercise Plan Access Code: [email or web address] (continue previous HEP)    Consulted and Agree with Plan of Care Patient             Patient will benefit from skilled therapeutic intervention in order to improve the following deficits and impairments:  Abnormal gait, Decreased activity tolerance, Decreased balance, Decreased mobility, Decreased range of motion, Decreased scar mobility, Decreased skin integrity, Decreased strength, Increased edema, Impaired flexibility, Pain  Visit Diagnosis: Acute pain of left knee  Muscle weakness (generalized)  Localized edema  Difficulty in walking, not elsewhere classified  Unsteadiness on feet     Problem List Patient Active Problem List   Diagnosis Date Noted   Primary osteoarthritis of left knee 11/02/2020   Status post left knee replacement 11/02/2020   Status post total right knee replacement 08/10/2020   Primary osteoarthritis of right knee 01/30/2020   Chronic pain of left knee 09/11/2018   Meniscus degeneration, left 09/11/2018   Fibromyalgia 09/11/2018   Chronic pain syndrome 09/11/2018   Venous ulcer ([place]) 02/02/2017   Varicose veins of bilateral lower extremities with pain 05/16/2016   Chronic venous insufficiency 05/16/2016   Pain in limb 05/16/2016   Swelling of limb 05/16/2016   DJD (degenerative joint disease) 05/16/2016    Debbe Odea, PT,DPT 01/07/2021, 11:26 AM  [facility] Physical Therapy [address] [place], [place],  [address] Phone: [phone]   Fax:  [phone]  Name: Anna Mccarthy MRN: [id number] Date of Birth: 10-09-1955

## 2021-01-11 ENCOUNTER — Encounter: Payer: Self-pay | Admitting: Physical Therapy

## 2021-01-11 ENCOUNTER — Other Ambulatory Visit: Payer: Self-pay

## 2021-01-11 ENCOUNTER — Ambulatory Visit (INDEPENDENT_AMBULATORY_CARE_PROVIDER_SITE_OTHER): Payer: Medicare Other | Admitting: Physical Therapy

## 2021-01-11 DIAGNOSIS — M25561 Pain in right knee: Secondary | ICD-10-CM

## 2021-01-11 DIAGNOSIS — M6281 Muscle weakness (generalized): Secondary | ICD-10-CM

## 2021-01-11 DIAGNOSIS — R2681 Unsteadiness on feet: Secondary | ICD-10-CM

## 2021-01-11 DIAGNOSIS — M25562 Pain in left knee: Secondary | ICD-10-CM

## 2021-01-11 DIAGNOSIS — R600 Localized edema: Secondary | ICD-10-CM

## 2021-01-11 DIAGNOSIS — R262 Difficulty in walking, not elsewhere classified: Secondary | ICD-10-CM

## 2021-01-11 NOTE — Therapy (Signed)
[facility] Physical Therapy [address] [place], [place], [address] Phone: [phone]   Fax:  [phone]  Physical Therapy Treatment  Patient Details  Name: Anna Mccarthy MRN: [id number] Date of Birth: 05/03/1956 Referring Provider (PT): Frankey Shown, MD   Encounter Date: 01/11/2021   PT End of Session - 01/11/21 1025     Visit Number 14    Number of Visits 16    Date for PT Re-Evaluation 02/12/21    Authorization Type Medicare A&B and AARP    Authorization Time Period progress note sent at 8th visit on 12/14/2020.    Progress Note Due on Visit 18    PT Start Time 1019    PT Stop Time 1100    PT Time Calculation (min) 41 min    Activity Tolerance Patient tolerated treatment well    Behavior During Therapy WFL for tasks assessed/performed             Past Medical History:  Diagnosis Date   Allergy    Anxiety    Arthritis    Bronchitis    Cancer ([place])    skin   Fibromyalgia    GERD (gastroesophageal reflux disease)    Hypertension    Osteoarthritis    Pneumonia     Past Surgical History:  Procedure Laterality Date   ABDOMINAL HYSTERECTOMY  1996   BREAST BIOPSY Left 2011   stereo done by dr. Bary Castilla -benign   COLONOSCOPY     COLONOSCOPY WITH PROPOFOL N/A 05/30/2016   Procedure: COLONOSCOPY WITH PROPOFOL;  Surgeon: Lollie Sails, MD;  Location: [facility] ENDOSCOPY;  Service: Endoscopy;  Laterality: N/A;   DILATION AND CURETTAGE OF UTERUS     x3   HERNIA REPAIR  2012   JOINT REPLACEMENT     KNEE ARTHROSCOPY WITH MEDIAL MENISECTOMY Left 02/18/2016   Procedure: KNEE ARTHROSCOPY WITH PARTIAL MEDIAL MENISECTOMY;  Surgeon: Hessie Knows, MD;  Location: [facility]S;  Service: Orthopedics;  Laterality: Left;   TOTAL KNEE ARTHROPLASTY Right 08/10/2020   Procedure: RIGHT TOTAL KNEE ARTHROPLASTY;  Surgeon: Leandrew Koyanagi, MD;  Location: [place];  Service: Orthopedics;  Laterality: Right;   TOTAL KNEE ARTHROPLASTY Left 11/02/2020   Procedure: LEFT TOTAL KNEE  ARTHROPLASTY;  Surgeon: Leandrew Koyanagi, MD;  Location: [place];  Service: Orthopedics;  Laterality: Left;   TUBAL LIGATION  1984   VARICOSE VEIN SURGERY Bilateral     There were no vitals filed for this visit.   Subjective Assessment - 01/11/21 1024     Subjective Pt reporting pain with prolonged sitting of 2/10 and at night when trying to sleep, 1/10 pain at present.    Pertinent History anxiety, arthritis, skin CA, fibromylagia, HTN,    Diagnostic tests X-ray    Patient Stated Goals improve strength and walking, stop hurting    Currently in Pain? Yes    Pain Score 1     Pain Location Knee    Pain Orientation Left    Pain Descriptors / Indicators Sore;Tightness    Pain Onset More than a month ago                [facility] PT Assessment - 01/11/21 0001       Assessment   Medical Diagnosis left knee arthroplasty,    Referring Provider (PT) Frankey Shown, MD    Onset Date/Surgical Date 11/02/20      AROM   Left Knee Extension 2    Left Knee Flexion 120  [place] Adult PT Treatment/Exercise - 01/11/21 0001       Neuro Re-ed    Neuro Re-ed Details  tandum walking, backward walking, braiding alternating front and back, step navigation x 2 flights      Knee/Hip Exercises: Stretches   Press photographer Both;3 reps;30 seconds    Gastroc Stretch Limitations slant board      Knee/Hip Exercises: Aerobic   Recumbent Bike L8 x 10 minutes      Knee/Hip Exercises: Machines for Strengthening   Cybex Knee Extension bilateral LE:  3 x 10 20# slow lower    Cybex Knee Flexion 20# L LE only 2x10    Cybex Leg Press bilateral legs: 100# 3x10, L LE only: 68# 3x10      Knee/Hip Exercises: Standing   Other Standing Knee Exercises TRX squats 2x10 with 5 sec holds, TRX lunges X10 bilat 2-3 sec hold      Manual Therapy   Manual therapy comments overpressure with extension on L knee x 5 holding 5 seconds                      PT Short Term  Goals - 01/04/21 1035       PT SHORT TERM GOAL #1   Title Patient demonstrates understanding of HEP for first 4 weeks.    Status Achieved      PT SHORT TERM GOAL #2   Title left knee PROM 0-100 degrees    Status Achieved               PT Long Term Goals - 01/11/21 1030       PT LONG TERM GOAL #1   Title Pt will increase FOTO score to 60.    Baseline 71 on 01/11/2021    Status Achieved      PT LONG TERM GOAL #2   Title Pt will report pain </= 2/10 in left knee with ADL's.    Baseline 1/10 pain reported today    Status Partially Met      PT LONG TERM GOAL #3   Title Pt will increased left knee AROM to >/= 0-110 degrees    Baseline 2-120 degrees 01/11/2021    Status Partially Met      PT LONG TERM GOAL #4   Title Pt will incrased her left knee strength to 5/5.    Baseline 4+/5 in left knee    Status On-going      PT LONG TERM GOAL #5   Title Patient ambulates >500' and negotiates ramps & curbs independently.    Baseline pt walking 3 blocks around neighborhood with increased soreness at night and some the next day.    Status Achieved      PT LONG TERM GOAL #7   Title Pt will be able to desend 2 flights of stairs with no handrail with pain </= 3/10.    Status On-going                   Plan - 01/11/21 1028     Clinical Impression Statement Pt is making great progress with overall functional mobility. Pt still reporting mild pain and stiffness after prolonged sitting and standing. Pt still progressing with quad strength. Continue skilled PT and reasses in the next 2 visits.    Personal Factors and Comorbidities Comorbidity 3+    Comorbidities anxiety, arthritis, skin CA, fibromylagia, HTN    Examination-Activity Limitations Lift;Locomotion Level;Squat;Stairs;Stand;Transfers    Examination-Participation Restrictions Community Activity;Driving;[employer]  Work;Other;Shop    Stability/Clinical Decision Making Stable/Uncomplicated    Rehab Potential Good    PT  Frequency 2x / week    PT Duration 8 weeks    PT Treatment/Interventions ADLs/Self Care Home Management;Electrical Stimulation;Cryotherapy;DME Instruction;Gait training;Stair training;Functional mobility training;Therapeutic activities;Therapeutic exercise;Balance training;Neuromuscular re-education;Patient/family education;Manual techniques;Scar mobilization;Passive range of motion;Dry needling;Taping;Vasopneumatic Device;Vestibular;Joint Manipulations    PT Next Visit Plan Eccentric loading continued, dynamic balance    PT Home Exercise Plan Access Code: [email or web address] (continue previous HEP)    Consulted and Agree with Plan of Care Patient             Patient will benefit from skilled therapeutic intervention in order to improve the following deficits and impairments:  Abnormal gait, Decreased activity tolerance, Decreased balance, Decreased mobility, Decreased range of motion, Decreased scar mobility, Decreased skin integrity, Decreased strength, Increased edema, Impaired flexibility, Pain  Visit Diagnosis: Acute pain of left knee  Muscle weakness (generalized)  Difficulty in walking, not elsewhere classified  Localized edema  Unsteadiness on feet  Acute pain of right knee     Problem List Patient Active Problem List   Diagnosis Date Noted   Primary osteoarthritis of left knee 11/02/2020   Status post left knee replacement 11/02/2020   Status post total right knee replacement 08/10/2020   Primary osteoarthritis of right knee 01/30/2020   Chronic pain of left knee 09/11/2018   Meniscus degeneration, left 09/11/2018   Fibromyalgia 09/11/2018   Chronic pain syndrome 09/11/2018   Venous ulcer ([place]) 02/02/2017   Varicose veins of bilateral lower extremities with pain 05/16/2016   Chronic venous insufficiency 05/16/2016   Pain in limb 05/16/2016   Swelling of limb 05/16/2016   DJD (degenerative joint disease) 05/16/2016    Oretha Caprice, PT, MPT 01/11/2021, 11:04  AM  [facility] Physical Therapy [address] [place], [place], [address] Phone: [phone]   Fax:  [phone]  Name: BRYAHNA LESKO MRN: [id number] Date of Birth: Dec 25, 1955

## 2021-01-14 ENCOUNTER — Encounter: Payer: Medicare Other | Admitting: Physical Therapy

## 2021-01-18 ENCOUNTER — Other Ambulatory Visit: Payer: Self-pay

## 2021-01-18 ENCOUNTER — Encounter: Payer: Self-pay | Admitting: Physical Therapy

## 2021-01-18 ENCOUNTER — Ambulatory Visit (INDEPENDENT_AMBULATORY_CARE_PROVIDER_SITE_OTHER): Payer: Medicare Other | Admitting: Physical Therapy

## 2021-01-18 DIAGNOSIS — R262 Difficulty in walking, not elsewhere classified: Secondary | ICD-10-CM | POA: Diagnosis not present

## 2021-01-18 DIAGNOSIS — M6281 Muscle weakness (generalized): Secondary | ICD-10-CM | POA: Diagnosis not present

## 2021-01-18 DIAGNOSIS — M25562 Pain in left knee: Secondary | ICD-10-CM | POA: Diagnosis not present

## 2021-01-18 DIAGNOSIS — M25561 Pain in right knee: Secondary | ICD-10-CM

## 2021-01-18 DIAGNOSIS — R600 Localized edema: Secondary | ICD-10-CM | POA: Diagnosis not present

## 2021-01-18 DIAGNOSIS — M25661 Stiffness of right knee, not elsewhere classified: Secondary | ICD-10-CM

## 2021-01-18 DIAGNOSIS — R2681 Unsteadiness on feet: Secondary | ICD-10-CM

## 2021-01-18 NOTE — Therapy (Signed)
[facility] Physical Therapy [address] [place], [place], [address] Phone: [phone]   Fax:  [phone]  Physical Therapy Treatment  Patient Details  Name: Anna Mccarthy MRN: [id number] Date of Birth: [birth year] Referring Provider (PT): Frankey Shown, MD   Encounter Date: 01/18/2021   PT End of Session - 01/18/21 1020     Visit Number 15    Number of Visits 16    Date for PT Re-Evaluation 02/12/21    Authorization Type Medicare A&B and AARP    Authorization Time Period progress note sent at 8th visit on 12/14/2020.    Progress Note Due on Visit 18    PT Start Time 1015    PT Stop Time 1100    PT Time Calculation (min) 45 min    Activity Tolerance Patient tolerated treatment well    Behavior During Therapy WFL for tasks assessed/performed             Past Medical History:  Diagnosis Date   Allergy    Anxiety    Arthritis    Bronchitis    Cancer ([place])    skin   Fibromyalgia    GERD (gastroesophageal reflux disease)    Hypertension    Osteoarthritis    Pneumonia     Past Surgical History:  Procedure Laterality Date   ABDOMINAL HYSTERECTOMY  1996   BREAST BIOPSY Left 2011   stereo done by dr. Bary Castilla -benign   COLONOSCOPY     COLONOSCOPY WITH PROPOFOL N/A 05/30/2016   Procedure: COLONOSCOPY WITH PROPOFOL;  Surgeon: Lollie Sails, MD;  Location: [facility] ENDOSCOPY;  Service: Endoscopy;  Laterality: N/A;   DILATION AND CURETTAGE OF UTERUS     x3   HERNIA REPAIR  2012   JOINT REPLACEMENT     KNEE ARTHROSCOPY WITH MEDIAL MENISECTOMY Left 02/18/2016   Procedure: KNEE ARTHROSCOPY WITH PARTIAL MEDIAL MENISECTOMY;  Surgeon: Hessie Knows, MD;  Location: [facility];  Service: Orthopedics;  Laterality: Left;   TOTAL KNEE ARTHROPLASTY Right 08/10/2020   Procedure: RIGHT TOTAL KNEE ARTHROPLASTY;  Surgeon: Leandrew Koyanagi, MD;  Location: [place];  Service: Orthopedics;  Laterality: Right;   TOTAL KNEE ARTHROPLASTY Left 11/02/2020   Procedure: LEFT TOTAL KNEE  ARTHROPLASTY;  Surgeon: Leandrew Koyanagi, MD;  Location: [place];  Service: Orthopedics;  Laterality: Left;   TUBAL LIGATION  1984   VARICOSE VEIN SURGERY Bilateral     There were no vitals filed for this visit.   Subjective Assessment - 01/18/21 1017     Subjective Pt arriving reporting she feels like she over did it on Tuesday and having company it took a toll on her left knee. Pt reproting by Thursday she had difficulty putting full weight on it. Pt reporting 3/10 pain in left knee today.    Pertinent History anxiety, arthritis, skin CA, fibromylagia, HTN,    Diagnostic tests X-ray    Patient Stated Goals improve strength and walking, stop hurting    Pain Score 3     Pain Location Knee    Pain Orientation Left    Pain Descriptors / Indicators Sore    Pain Onset More than a month ago    Pain Frequency Intermittent                [facility] PT Assessment - 01/18/21 0001       Assessment   Medical Diagnosis left knee arthroplasty,    Referring Provider (PT) Frankey Shown, MD    Onset Date/Surgical Date  11/02/20      AROM   Left Knee Extension 2    Left Knee Flexion 120                           [facility] Adult PT Treatment/Exercise - 01/18/21 0001       Neuro Re-ed    Neuro Re-ed Details  lateral stepping 20 feet x 4 both directions wiht green theraband around thighs, vectors with 2 cones      Knee/Hip Exercises: Stretches   Gastroc Stretch Both;3 reps;30 seconds    Gastroc Stretch Limitations slant board      Knee/Hip Exercises: Aerobic   Nustep L6 x 8 minutes      Knee/Hip Exercises: Machines for Strengthening   Cybex Knee Extension Left LE only: 10# 2x10    Cybex Knee Flexion 20# L LE only 2x10    Cybex Leg Press bilateral legs: 100# 3x10, L LE only: 56# 3x10      Knee/Hip Exercises: Standing   Forward Step Up Left;10 reps;Hand Hold: 0;Step Height: 8"    Other Standing Knee Exercises TRX squats 2x10 with 5 sec holds, TRX lunges X10 bilat 2-3 sec hold                       PT Short Term Goals - 01/04/21 1035       PT SHORT TERM GOAL #1   Title Patient demonstrates understanding of HEP for first 4 weeks.    Status Achieved      PT SHORT TERM GOAL #2   Title left knee PROM 0-100 degrees    Status Achieved               PT Long Term Goals - 01/18/21 1025       PT LONG TERM GOAL #1   Title Pt will increase FOTO score to 60.    Status Achieved      PT LONG TERM GOAL #2   Title Pt will report pain </= 2/10 in left knee with ADL's.    Status Partially Met      PT LONG TERM GOAL #3   Title Pt will increased left knee AROM to >/= 0-110 degrees    Status Achieved      PT LONG TERM GOAL #4   Title Pt will incrased her left knee strength to 5/5.    Status On-going      PT LONG TERM GOAL #5   Title Patient ambulates >500' and negotiates ramps & curbs independently.    Status Achieved      PT LONG TERM GOAL #7   Title Pt will be able to desend 2 flights of stairs with no handrail with pain </= 3/10.    Status On-going                   Plan - 01/18/21 1021     Clinical Impression Statement Pt arriving today reporting increased knee pain which started last Tuesday. Pt stating that her increased activity she feels that she over did it. Mild pain noted with weighted flexion. We aldo discussed going to [employer] for new shoe evaluation.  Pt tolerating exercises well. Consider reassessment with extension if her pain continues vs discharge at next visit. We discussed extension of 1x/ week for an additional 4-6 weeks pending reassessment next visit.    Personal Factors and Comorbidities Comorbidity 3+    Comorbidities anxiety,  arthritis, skin CA, fibromylagia, HTN    Examination-Activity Limitations Lift;Locomotion Level;Squat;Stairs;Stand;Transfers    Examination-Participation Restrictions Community Activity;Driving;Yard Work;Other;Shop    Stability/Clinical Decision Making Stable/Uncomplicated    Rehab  Potential Good    PT Frequency 2x / week    PT Duration 8 weeks    PT Treatment/Interventions ADLs/Self Care Home Management;Electrical Stimulation;Cryotherapy;DME Instruction;Gait training;Stair training;Functional mobility training;Therapeutic activities;Therapeutic exercise;Balance training;Neuromuscular re-education;Patient/family education;Manual techniques;Scar mobilization;Passive range of motion;Dry needling;Taping;Vasopneumatic Device;Vestibular;Joint Manipulations    PT Next Visit Plan Eccentric loading continued, dynamic balance    PT Home Exercise Plan Access Code: [email or web address] (continue previous HEP)    Consulted and Agree with Plan of Care Patient             Patient will benefit from skilled therapeutic intervention in order to improve the following deficits and impairments:  Abnormal gait, Decreased activity tolerance, Decreased balance, Decreased mobility, Decreased range of motion, Decreased scar mobility, Decreased skin integrity, Decreased strength, Increased edema, Impaired flexibility, Pain  Visit Diagnosis: Acute pain of left knee  Muscle weakness (generalized)  Difficulty in walking, not elsewhere classified  Localized edema  Acute pain of right knee  Unsteadiness on feet  Stiffness of right knee, not elsewhere classified     Problem List Patient Active Problem List   Diagnosis Date Noted   Primary osteoarthritis of left knee 11/02/2020   Status post left knee replacement 11/02/2020   Status post total right knee replacement 08/10/2020   Primary osteoarthritis of right knee 01/30/2020   Chronic pain of left knee 09/11/2018   Meniscus degeneration, left 09/11/2018   Fibromyalgia 09/11/2018   Chronic pain syndrome 09/11/2018   Venous ulcer ([place]) 02/02/2017   Varicose veins of bilateral lower extremities with pain 05/16/2016   Chronic venous insufficiency 05/16/2016   Pain in limb 05/16/2016   Swelling of limb 05/16/2016   DJD (degenerative joint  disease) 05/16/2016    Oretha Caprice, PT, MPT 01/18/2021, 10:55 AM  [facility] Physical Therapy [address] [place], [place], [address] Phone: [phone]   Fax:  [phone]  Name: LILI HARTS MRN: [id number] Date of Birth: 09/17/1955

## 2021-01-21 ENCOUNTER — Encounter: Payer: Self-pay | Admitting: Physical Therapy

## 2021-01-21 ENCOUNTER — Ambulatory Visit (INDEPENDENT_AMBULATORY_CARE_PROVIDER_SITE_OTHER): Payer: Medicare Other | Admitting: Physical Therapy

## 2021-01-21 ENCOUNTER — Other Ambulatory Visit: Payer: Self-pay

## 2021-01-21 DIAGNOSIS — R262 Difficulty in walking, not elsewhere classified: Secondary | ICD-10-CM

## 2021-01-21 DIAGNOSIS — M6281 Muscle weakness (generalized): Secondary | ICD-10-CM

## 2021-01-21 DIAGNOSIS — R600 Localized edema: Secondary | ICD-10-CM | POA: Diagnosis not present

## 2021-01-21 DIAGNOSIS — M25562 Pain in left knee: Secondary | ICD-10-CM | POA: Diagnosis not present

## 2021-01-21 DIAGNOSIS — M25561 Pain in right knee: Secondary | ICD-10-CM

## 2021-01-21 DIAGNOSIS — R2681 Unsteadiness on feet: Secondary | ICD-10-CM

## 2021-01-21 NOTE — Therapy (Signed)
[facility] Physical Therapy [address] [place], [place], [address] Phone: [phone]   Fax:  [phone]  Physical Therapy Treatment/REcert  Patient Details  Name: Anna Mccarthy MRN: [id number] Date of Birth: [birth year] Referring Provider (PT): Frankey Shown, MD   Encounter Date: 01/21/2021   PT End of Session - 01/21/21 1046     Visit Number 16    Number of Visits 22    Date for PT Re-Evaluation 03/04/21    Authorization Type Medicare A&B and AARP    Authorization Time Period progress note sent at 8th visit on 12/14/2020.    Progress Note Due on Visit 18    PT Start Time 1019    PT Stop Time 1100    PT Time Calculation (min) 41 min    Activity Tolerance Patient tolerated treatment well    Behavior During Therapy WFL for tasks assessed/performed             Past Medical History:  Diagnosis Date   Allergy    Anxiety    Arthritis    Bronchitis    Cancer ([place])    skin   Fibromyalgia    GERD (gastroesophageal reflux disease)    Hypertension    Osteoarthritis    Pneumonia     Past Surgical History:  Procedure Laterality Date   ABDOMINAL HYSTERECTOMY  1996   BREAST BIOPSY Left 2011   stereo done by dr. Bary Castilla -benign   COLONOSCOPY     COLONOSCOPY WITH PROPOFOL N/A 05/30/2016   Procedure: COLONOSCOPY WITH PROPOFOL;  Surgeon: Lollie Sails, MD;  Location: [facility] ENDOSCOPY;  Service: Endoscopy;  Laterality: N/A;   DILATION AND CURETTAGE OF UTERUS     x3   HERNIA REPAIR  2012   JOINT REPLACEMENT     KNEE ARTHROSCOPY WITH MEDIAL MENISECTOMY Left 02/18/2016   Procedure: KNEE ARTHROSCOPY WITH PARTIAL MEDIAL MENISECTOMY;  Surgeon: Hessie Knows, MD;  Location: [facility];  Service: Orthopedics;  Laterality: Left;   TOTAL KNEE ARTHROPLASTY Right 08/10/2020   Procedure: RIGHT TOTAL KNEE ARTHROPLASTY;  Surgeon: Leandrew Koyanagi, MD;  Location: [place];  Service: Orthopedics;  Laterality: Right;   TOTAL KNEE ARTHROPLASTY Left 11/02/2020   Procedure: LEFT  TOTAL KNEE ARTHROPLASTY;  Surgeon: Leandrew Koyanagi, MD;  Location: [place];  Service: Orthopedics;  Laterality: Left;   TUBAL LIGATION  1984   VARICOSE VEIN SURGERY Bilateral     There were no vitals filed for this visit.   Subjective Assessment - 01/21/21 1045     Subjective Pt arriving reporting her knee pain is a lot better only 1-2 out of 10 overall today. She would like to extend her PT to continue to work on balance, strength, and extension ROM.    Pertinent History anxiety, arthritis, skin CA, fibromylagia, HTN,    Diagnostic tests X-ray    Patient Stated Goals improve strength and walking, stop hurting    Pain Onset More than a month ago                [facility] PT Assessment - 01/21/21 0001       Assessment   Medical Diagnosis left knee arthroplasty,    Referring Provider (PT) Frankey Shown, MD    Onset Date/Surgical Date 11/02/20      AROM   Left Knee Extension 2    Left Knee Flexion 120      Strength   Overall Strength Comments Lt hip 5/5 flexion and abd in siting  Left Knee Flexion 4+/5    Left Knee Extension 5/5                           [facility] Adult PT Treatment/Exercise - 01/21/21 0001       Neuro Re-ed    Neuro Re-ed Details  rocker board 2 min lateral, 2 min A-P, tandem walk on foam beam and sidesteping on foam beam X 5 round trips ea      Knee/Hip Exercises: Aerobic   Recumbent Bike L8 x 10 minutes      Knee/Hip Exercises: Machines for Strengthening   Cybex Knee Extension Left LE only: 10# 3x10    Cybex Knee Flexion 20# L LE only 3x10, 35# DL 2X10      Knee/Hip Exercises: Standing   Other Standing Knee Exercises TRX squats 2x10 with 5 sec holds, TRX lunges X10 bilat 2-3 sec hold                      PT Short Term Goals - 01/21/21 1058       PT SHORT TERM GOAL #1   Title Patient demonstrates understanding of HEP for first 4 weeks.    Status Achieved      PT SHORT TERM GOAL #2   Title left knee PROM 0-100 degrees     Status Achieved               PT Long Term Goals - 01/21/21 1058       PT LONG TERM GOAL #1   Title Pt will increase FOTO score to 60.    Baseline 71 on 01/11/2021    Status Achieved      PT LONG TERM GOAL #2   Title Pt will report pain </= 2/10 in left knee with ADL's.    Baseline was 3/10 this week    Status Partially Met      PT LONG TERM GOAL #3   Title Pt will increased left knee AROM to >/= 0-110 degrees    Status Partially Met      PT LONG TERM GOAL #4   Title Pt will incrased her left knee strength to 5/5.    Baseline 4+/5 in left knee    Status Partially Met      PT LONG TERM GOAL #5   Title Patient ambulates >500' and negotiates ramps & curbs independently.    Status Achieved      PT LONG TERM GOAL #7   Title Pt will be able to desend 2 flights of stairs with no handrail with pain </= 3/10.    Status On-going                   Plan - 01/21/21 1055     Clinical Impression Statement Recert today as she was at the end of her plan of care. She has overall made good progress but would benefit from up to 6 more weeks of PT 1 time per week to maximize her function and work to improve her knee extension ROM, balance, and overall Lt knee strength.    Personal Factors and Comorbidities Comorbidity 3+    Comorbidities anxiety, arthritis, skin CA, fibromylagia, HTN    Examination-Activity Limitations Lift;Locomotion Level;Squat;Stairs;Stand;Transfers    Examination-Participation Restrictions Community Activity;Driving;Yard Work;Other;Shop    Stability/Clinical Decision Making Stable/Uncomplicated    Rehab Potential Good    PT Frequency 2x / week    PT Duration  8 weeks    PT Treatment/Interventions ADLs/Self Care Home Management;Electrical Stimulation;Cryotherapy;DME Instruction;Gait training;Stair training;Functional mobility training;Therapeutic activities;Therapeutic exercise;Balance training;Neuromuscular re-education;Patient/family education;Manual  techniques;Scar mobilization;Passive range of motion;Dry needling;Taping;Vasopneumatic Device;Vestibular;Joint Manipulations    PT Next Visit Plan Eccentric loading continued, knee extnsion ROM, dynamic balance    PT Home Exercise Plan Access Code: [email or web address] (continue previous HEP)    Consulted and Agree with Plan of Care Patient             Patient will benefit from skilled therapeutic intervention in order to improve the following deficits and impairments:  Abnormal gait, Decreased activity tolerance, Decreased balance, Decreased mobility, Decreased range of motion, Decreased scar mobility, Decreased skin integrity, Decreased strength, Increased edema, Impaired flexibility, Pain  Visit Diagnosis: Acute pain of left knee  Muscle weakness (generalized)  Difficulty in walking, not elsewhere classified  Localized edema  Acute pain of right knee  Unsteadiness on feet     Problem List Patient Active Problem List   Diagnosis Date Noted   Primary osteoarthritis of left knee 11/02/2020   Status post left knee replacement 11/02/2020   Status post total right knee replacement 08/10/2020   Primary osteoarthritis of right knee 01/30/2020   Chronic pain of left knee 09/11/2018   Meniscus degeneration, left 09/11/2018   Fibromyalgia 09/11/2018   Chronic pain syndrome 09/11/2018   Venous ulcer ([place]) 02/02/2017   Varicose veins of bilateral lower extremities with pain 05/16/2016   Chronic venous insufficiency 05/16/2016   Pain in limb 05/16/2016   Swelling of limb 05/16/2016   DJD (degenerative joint disease) 05/16/2016    Debbe Odea 01/21/2021, 10:59 AM  [facility] Physical Therapy [address] [place], [place], [address] Phone: [phone]   Fax:  [phone]  Name: AVLEEN BORDWELL MRN: [id number] Date of Birth: 1955/09/16

## 2021-01-26 ENCOUNTER — Ambulatory Visit (INDEPENDENT_AMBULATORY_CARE_PROVIDER_SITE_OTHER): Payer: Medicare Other | Admitting: Orthopaedic Surgery

## 2021-01-26 ENCOUNTER — Encounter: Payer: Self-pay | Admitting: Orthopaedic Surgery

## 2021-01-26 ENCOUNTER — Other Ambulatory Visit: Payer: Self-pay

## 2021-01-26 DIAGNOSIS — Z96652 Presence of left artificial knee joint: Secondary | ICD-10-CM

## 2021-01-26 NOTE — Progress Notes (Signed)
Post-Op Visit Note   Patient: Anna Mccarthy           Date of Birth: 01/01/1956           MRN: [id number] Visit Date: 01/26/2021 PCP: Maryland Pink, MD   Assessment & Plan:  Chief Complaint:  Chief Complaint  Patient presents with   Left Knee - Pain   Visit Diagnoses:  1. Status post left knee replacement     Plan: Anna Mccarthy is 3 months status post left total knee replacement.  Overall doing well.  She has some residual difficulty with balance and going up and down stairs.  Overall doing well has no real complaints.  Left knee shows a fully healed surgical scar.  Range of motion is 2 to 120 degrees.  Stable to varus valgus.  No signs of infection.  I am very happy with Anna Mccarthy's progress so far.  I do agree that she would benefit from 6 more weeks of physical therapy to work on strength and balance.  Dental prophylaxis reinforced.  Recheck in 3 months with two-view x-rays of both knees.  Follow-Up Instructions: Return in about 3 months (around 04/28/2021).   Orders:  No orders of the defined types were placed in this encounter.  No orders of the defined types were placed in this encounter.   Imaging: No results found.  PMFS History: Patient Active Problem List   Diagnosis Date Noted   Primary osteoarthritis of left knee 11/02/2020   Status post left knee replacement 11/02/2020   Status post total right knee replacement 08/10/2020   Primary osteoarthritis of right knee 01/30/2020   Chronic pain of left knee 09/11/2018   Meniscus degeneration, left 09/11/2018   Fibromyalgia 09/11/2018   Chronic pain syndrome 09/11/2018   Venous ulcer ([place]) 02/02/2017   Varicose veins of bilateral lower extremities with pain 05/16/2016   Chronic venous insufficiency 05/16/2016   Pain in limb 05/16/2016   Swelling of limb 05/16/2016   DJD (degenerative joint disease) 05/16/2016   Past Medical History:  Diagnosis Date   Allergy    Anxiety    Arthritis    Bronchitis    Cancer ([place])     skin   Fibromyalgia    GERD (gastroesophageal reflux disease)    Hypertension    Osteoarthritis    Pneumonia     Family History  Problem Relation Age of Onset   Breast cancer Maternal Aunt    Ovarian cancer Sister    Breast cancer Cousin     Past Surgical History:  Procedure Laterality Date   ABDOMINAL HYSTERECTOMY  1996   BREAST BIOPSY Left 2011   stereo done by dr. Bary Castilla -benign   COLONOSCOPY     COLONOSCOPY WITH PROPOFOL N/A 05/30/2016   Procedure: COLONOSCOPY WITH PROPOFOL;  Surgeon: Lollie Sails, MD;  Location: [facility] ENDOSCOPY;  Service: Endoscopy;  Laterality: N/A;   DILATION AND CURETTAGE OF UTERUS     x3   HERNIA REPAIR  2012   JOINT REPLACEMENT     KNEE ARTHROSCOPY WITH MEDIAL MENISECTOMY Left 02/18/2016   Procedure: KNEE ARTHROSCOPY WITH PARTIAL MEDIAL MENISECTOMY;  Surgeon: Hessie Knows, MD;  Location: [facility] ORS;  Service: Orthopedics;  Laterality: Left;   TOTAL KNEE ARTHROPLASTY Right 08/10/2020   Procedure: RIGHT TOTAL KNEE ARTHROPLASTY;  Surgeon: Leandrew Koyanagi, MD;  Location: [place];  Service: Orthopedics;  Laterality: Right;   TOTAL KNEE ARTHROPLASTY Left 11/02/2020   Procedure: LEFT TOTAL KNEE ARTHROPLASTY;  Surgeon: Leandrew Koyanagi,  MD;  Location: [place];  Service: Orthopedics;  Laterality: Left;   TUBAL LIGATION  1984   VARICOSE VEIN SURGERY Bilateral    Social History   Occupational History   Not on file  Tobacco Use   Smoking status: Never   Smokeless tobacco: Never  Vaping Use   Vaping Use: Never used  Substance and Sexual Activity   Alcohol use: Yes    Comment: once per month   Drug use: No   Sexual activity: Not on file

## 2021-02-02 ENCOUNTER — Encounter: Payer: Self-pay | Admitting: Physical Therapy

## 2021-02-02 ENCOUNTER — Ambulatory Visit (INDEPENDENT_AMBULATORY_CARE_PROVIDER_SITE_OTHER): Payer: Medicare Other | Admitting: Physical Therapy

## 2021-02-02 ENCOUNTER — Encounter: Payer: Medicare Other | Admitting: Rehabilitative and Restorative Service Providers"

## 2021-02-02 ENCOUNTER — Encounter: Payer: Medicare Other | Admitting: Physical Therapy

## 2021-02-02 ENCOUNTER — Other Ambulatory Visit: Payer: Self-pay

## 2021-02-02 DIAGNOSIS — M6281 Muscle weakness (generalized): Secondary | ICD-10-CM

## 2021-02-02 DIAGNOSIS — R262 Difficulty in walking, not elsewhere classified: Secondary | ICD-10-CM

## 2021-02-02 DIAGNOSIS — M25562 Pain in left knee: Secondary | ICD-10-CM | POA: Diagnosis not present

## 2021-02-02 DIAGNOSIS — R600 Localized edema: Secondary | ICD-10-CM

## 2021-02-02 DIAGNOSIS — M25561 Pain in right knee: Secondary | ICD-10-CM

## 2021-02-02 DIAGNOSIS — R2681 Unsteadiness on feet: Secondary | ICD-10-CM

## 2021-02-02 NOTE — Therapy (Signed)
[facility] Physical Therapy [address] [place], [place], [address] Phone: [phone]   Fax:  [phone]  Physical Therapy Treatment  Patient Details  Name: Anna Mccarthy MRN: [id number] Date of Birth: 04-04-1956 Referring Provider (PT): Frankey Shown, MD   Encounter Date: 02/02/2021   PT End of Session - 02/02/21 1021     Visit Number 17    Number of Visits 22    Date for PT Re-Evaluation 03/04/21    Authorization Type Medicare A&B and AARP    Authorization Time Period recert sent on [id number]    Progress Note Due on Visit 18    PT Start Time 1015    PT Stop Time 1055    PT Time Calculation (min) 40 min    Activity Tolerance Patient tolerated treatment well    Behavior During Therapy [facility] for tasks assessed/performed             Past Medical History:  Diagnosis Date   Allergy    Anxiety    Arthritis    Bronchitis    Cancer ([place])    skin   Fibromyalgia    GERD (gastroesophageal reflux disease)    Hypertension    Osteoarthritis    Pneumonia     Past Surgical History:  Procedure Laterality Date   ABDOMINAL HYSTERECTOMY  1996   BREAST BIOPSY Left 2011   stereo done by dr. Bary Castilla -benign   COLONOSCOPY     COLONOSCOPY WITH PROPOFOL N/A 05/30/2016   Procedure: COLONOSCOPY WITH PROPOFOL;  Surgeon: Lollie Sails, MD;  Location: [facility] ENDOSCOPY;  Service: Endoscopy;  Laterality: N/A;   DILATION AND CURETTAGE OF UTERUS     x3   HERNIA REPAIR  2012   JOINT REPLACEMENT     KNEE ARTHROSCOPY WITH MEDIAL MENISECTOMY Left 02/18/2016   Procedure: KNEE ARTHROSCOPY WITH PARTIAL MEDIAL MENISECTOMY;  Surgeon: Hessie Knows, MD;  Location: [facility];  Service: Orthopedics;  Laterality: Left;   TOTAL KNEE ARTHROPLASTY Right 08/10/2020   Procedure: RIGHT TOTAL KNEE ARTHROPLASTY;  Surgeon: Leandrew Koyanagi, MD;  Location: [place];  Service: Orthopedics;  Laterality: Right;   TOTAL KNEE ARTHROPLASTY Left 11/02/2020   Procedure: LEFT TOTAL KNEE ARTHROPLASTY;   Surgeon: Leandrew Koyanagi, MD;  Location: [place];  Service: Orthopedics;  Laterality: Left;   TUBAL LIGATION  1984   VARICOSE VEIN SURGERY Bilateral     There were no vitals filed for this visit.   Subjective Assessment - 02/02/21 1018     Subjective Pt arriving stating she just got back from 5 days at the beach and she did great. Pt reporting she was able to walk short distances in the sand and was able to sleep better.    Pertinent History anxiety, arthritis, skin CA, fibromylagia, HTN,    Diagnostic tests X-ray    Patient Stated Goals improve strength and walking, stop hurting    Currently in Pain? Yes    Pain Score 1     Pain Location Knee    Pain Orientation Left    Pain Descriptors / Indicators Sore    Pain Onset More than a month ago                [facility] PT Assessment - 02/02/21 0001       Assessment   Medical Diagnosis left knee arthroplasty,    Referring Provider (PT) Frankey Shown, MD    Onset Date/Surgical Date 11/02/20      AROM   Left Knee  Extension 2    Left Knee Flexion 120                           [facility] Adult PT Treatment/Exercise - 02/02/21 0001       Neuro Re-ed    Neuro Re-ed Details  toe walking, backward walking tandum, beam walking x 4 times, BOSU ball single leg 30 seconds x 4 with intermittent UE support, side stepping on beam x 6 down and back, lunges x 30 feet x 2      Knee/Hip Exercises: Aerobic   Recumbent Bike L8 x 8 minutes      Knee/Hip Exercises: Machines for Strengthening   Cybex Leg Press bilateral LE's 100# 3x10, Left LE only: 75# x20      Knee/Hip Exercises: Standing   Forward Step Up 20 reps;Hand Hold: 0;Step Height: 8"                      PT Short Term Goals - 01/21/21 1058       PT SHORT TERM GOAL #1   Title Patient demonstrates understanding of HEP for first 4 weeks.    Status Achieved      PT SHORT TERM GOAL #2   Title left knee PROM 0-100 degrees    Status Achieved                PT Long Term Goals - 02/02/21 1030       PT LONG TERM GOAL #1   Title Pt will increase FOTO score to 60.    Status Achieved      PT LONG TERM GOAL #2   Title Pt will report pain </= 2/10 in left knee with ADL's.    Status On-going      PT LONG TERM GOAL #3   Title Pt will increased left knee AROM to >/= 0-110 degrees    Status On-going      PT LONG TERM GOAL #4   Title Pt will incrased her left knee strength to 5/5.    Status On-going      PT LONG TERM GOAL #5   Title Patient ambulates >500' and negotiates ramps & curbs independently.    Status Achieved      PT LONG TERM GOAL #7   Title Pt will be able to desend 2 flights of stairs with no handrail with pain </= 3/10.    Status On-going                   Plan - 02/02/21 1024     Clinical Impression Statement Pt tolerating treatment with focusing on strengthening, functional mobility and dynamic balance. Conitnue skilled PT progressing toward LTG's.    Personal Factors and Comorbidities Comorbidity 3+    Examination-Activity Limitations Lift;Locomotion Level;Squat;Stairs;Stand;Transfers    Examination-Participation Restrictions Community Activity;Driving;Yard Work;Other;Shop    Stability/Clinical Decision Making Stable/Uncomplicated    Rehab Potential Good    PT Frequency 2x / week    PT Treatment/Interventions ADLs/Self Care Home Management;Electrical Stimulation;Cryotherapy;DME Instruction;Gait training;Stair training;Functional mobility training;Therapeutic activities;Therapeutic exercise;Balance training;Neuromuscular re-education;Patient/family education;Manual techniques;Scar mobilization;Passive range of motion;Dry needling;Taping;Vasopneumatic Device;Vestibular;Joint Manipulations    PT Next Visit Plan Eccentric loading quad strengthening, knee extension ROM, dynamic balance    PT Home Exercise Plan Access Code: [email or web address] (continue previous HEP)    Consulted and Agree with Plan of Care Patient              Patient  will benefit from skilled therapeutic intervention in order to improve the following deficits and impairments:  Abnormal gait, Decreased activity tolerance, Decreased balance, Decreased mobility, Decreased range of motion, Decreased scar mobility, Decreased skin integrity, Decreased strength, Increased edema, Impaired flexibility, Pain  Visit Diagnosis: Acute pain of left knee  Muscle weakness (generalized)  Difficulty in walking, not elsewhere classified  Localized edema  Acute pain of right knee  Unsteadiness on feet     Problem List Patient Active Problem List   Diagnosis Date Noted   Primary osteoarthritis of left knee 11/02/2020   Status post left knee replacement 11/02/2020   Status post total right knee replacement 08/10/2020   Primary osteoarthritis of right knee 01/30/2020   Chronic pain of left knee 09/11/2018   Meniscus degeneration, left 09/11/2018   Fibromyalgia 09/11/2018   Chronic pain syndrome 09/11/2018   Venous ulcer ([place]) 02/02/2017   Varicose veins of bilateral lower extremities with pain 05/16/2016   Chronic venous insufficiency 05/16/2016   Pain in limb 05/16/2016   Swelling of limb 05/16/2016   DJD (degenerative joint disease) 05/16/2016    Oretha Caprice, PT, MPT 02/02/2021, 10:50 AM  [facility] Physical Therapy [address] [place], [place], [address] Phone: [phone]   Fax:  [phone]  Name: LASHEL JENTZEN MRN: [id number] Date of Birth: 08/05/1955

## 2021-02-09 ENCOUNTER — Encounter: Payer: Medicare Other | Admitting: Physical Therapy

## 2021-02-16 ENCOUNTER — Ambulatory Visit (INDEPENDENT_AMBULATORY_CARE_PROVIDER_SITE_OTHER): Payer: Medicare Other | Admitting: Physical Therapy

## 2021-02-16 ENCOUNTER — Other Ambulatory Visit: Payer: Self-pay

## 2021-02-16 ENCOUNTER — Encounter: Payer: Self-pay | Admitting: Physical Therapy

## 2021-02-16 DIAGNOSIS — M25562 Pain in left knee: Secondary | ICD-10-CM

## 2021-02-16 DIAGNOSIS — R600 Localized edema: Secondary | ICD-10-CM

## 2021-02-16 DIAGNOSIS — R262 Difficulty in walking, not elsewhere classified: Secondary | ICD-10-CM | POA: Diagnosis not present

## 2021-02-16 DIAGNOSIS — M25561 Pain in right knee: Secondary | ICD-10-CM

## 2021-02-16 DIAGNOSIS — M6281 Muscle weakness (generalized): Secondary | ICD-10-CM

## 2021-02-16 DIAGNOSIS — R2681 Unsteadiness on feet: Secondary | ICD-10-CM

## 2021-02-16 NOTE — Therapy (Signed)
[facility] Physical Therapy [address] [place], [place], [address] Phone: [phone]   Fax:  [phone]  Physical Therapy Treatment Discharge Summary  Patient Details  Name: Anna Mccarthy MRN: [id number] Date of Birth: 01-Sep-1955 Referring Provider (PT): Frankey Shown, MD   Encounter Date: 02/16/2021   PT End of Session - 02/16/21 1013     Visit Number 18    Number of Visits 22    Date for PT Re-Evaluation 03/04/21    Authorization Type Medicare A&B and AARP    Authorization Time Period recert sent on 5/73/2202    Progress Note Due on Visit 28    PT Start Time 1010    PT Stop Time 1050    PT Time Calculation (min) 40 min    Activity Tolerance Patient tolerated treatment well    Behavior During Therapy [facility] for tasks assessed/performed             Past Medical History:  Diagnosis Date   Allergy    Anxiety    Arthritis    Bronchitis    Cancer ([place])    skin   Fibromyalgia    GERD (gastroesophageal reflux disease)    Hypertension    Osteoarthritis    Pneumonia     Past Surgical History:  Procedure Laterality Date   ABDOMINAL HYSTERECTOMY  1996   BREAST BIOPSY Left 2011   stereo done by dr. Bary Castilla -benign   COLONOSCOPY     COLONOSCOPY WITH PROPOFOL N/A 05/30/2016   Procedure: COLONOSCOPY WITH PROPOFOL;  Surgeon: Lollie Sails, MD;  Location: [facility] ENDOSCOPY;  Service: Endoscopy;  Laterality: N/A;   DILATION AND CURETTAGE OF UTERUS     x3   HERNIA REPAIR  2012   JOINT REPLACEMENT     KNEE ARTHROSCOPY WITH MEDIAL MENISECTOMY Left 02/18/2016   Procedure: KNEE ARTHROSCOPY WITH PARTIAL MEDIAL MENISECTOMY;  Surgeon: Hessie Knows, MD;  Location: [facility] ORS;  Service: Orthopedics;  Laterality: Left;   TOTAL KNEE ARTHROPLASTY Right 08/10/2020   Procedure: RIGHT TOTAL KNEE ARTHROPLASTY;  Surgeon: Leandrew Koyanagi, MD;  Location: [place];  Service: Orthopedics;  Laterality: Right;   TOTAL KNEE ARTHROPLASTY Left 11/02/2020   Procedure: LEFT TOTAL KNEE  ARTHROPLASTY;  Surgeon: Leandrew Koyanagi, MD;  Location: [place];  Service: Orthopedics;  Laterality: Left;   TUBAL LIGATION  1984   VARICOSE VEIN SURGERY Bilateral     There were no vitals filed for this visit.   Subjective Assessment - 02/16/21 1012     Subjective Pt arriving reporting no pain. Pt still reporting some difficulty with going down the stairs and with endurance.    Pertinent History anxiety, arthritis, skin CA, fibromylagia, HTN,    Diagnostic tests X-ray    Patient Stated Goals improve strength and walking, stop hurting    Currently in Pain? No/denies                [facility] PT Assessment - 02/16/21 0001       Assessment   Medical Diagnosis left knee arthroplasty,    Referring Provider (PT) Frankey Shown, MD    Onset Date/Surgical Date 11/02/20      AROM   Left Knee Extension 2    Left Knee Flexion 124      Strength   Left Knee Flexion 5/5    Left Knee Extension 5/5  [place] Adult PT Treatment/Exercise - 02/16/21 0001       Neuro Re-ed    Neuro Re-ed Details  walking on airex c no hand support, walking with sharp turns with no difficulty 10 feet x 8 both directions      Knee/Hip Exercises: Aerobic   Recumbent Bike L8 x 8 minutes      Knee/Hip Exercises: Standing   Stairs up /down 2 flights no hand rail, able to skip every other step using single hand rail going up aternating stepping.    SLS SLS on level surfaces: 30 seconds bilateral LE's, Airex: Lt 20 seconds, Rt 20 seconds    Other Standing Knee Exercises standing with eyes closed on level surfaces with feet together 30 seconds      Manual Therapy   Manual therapy comments knee extension overpressure                    PT Education - 02/16/21 1031     Education Details udated HEP    Person(s) Educated Patient    Methods Explanation;Demonstration    Comprehension Verbalized understanding;Returned demonstration              PT Short Term  Goals - 02/16/21 1015       PT SHORT TERM GOAL #1   Title Patient demonstrates understanding of HEP for first 4 weeks.    Status Achieved      PT SHORT TERM GOAL #2   Title left knee PROM 0-100 degrees    Status Achieved               PT Long Term Goals - 02/16/21 1015       PT LONG TERM GOAL #1   Title Pt will increase FOTO score to 60.    Baseline 82% on 02/16/2021    Status Achieved      PT LONG TERM GOAL #2   Title Pt will report pain </= 2/10 in left knee with ADL's.    Baseline no pain reported over the past week.    Status Achieved      PT LONG TERM GOAL #3   Title Pt will increased left knee AROM to >/= 0-110 degrees    Baseline 2-124 degrees 02/16/2021    Status Partially Met      PT LONG TERM GOAL #4   Title Pt will incrased her left knee strength to 5/5.    Baseline 5/5 on 02/16/2021    Status Achieved      PT LONG TERM GOAL #5   Title Patient ambulates >500' and negotiates ramps & curbs independently.    Status Achieved      PT LONG TERM GOAL #7   Title Pt will be able to desend 2 flights of stairs with no handrail with pain </= 3/10.    Baseline up and down 2 flights no hand rail with no pain reported    Status Achieved                   Plan - 02/16/21 1051     Clinical Impression Statement Pt has met all of her LTG's except full knee extension. Pt is currently at -2 degrees left knee extension and can reach full extension passively.PT's FOTO score has improved from 47% to 82%. Pt is able to navigate the stairs with no hand rail.  Pt feels comfortable with her current functional status and is being discharged from physical therapy at this time.  Personal Factors and Comorbidities Comorbidity 3+    Comorbidities anxiety, arthritis, skin CA, fibromylagia, HTN    Examination-Activity Limitations Lift;Locomotion Level;Squat;Stairs;Stand;Transfers    Examination-Participation Restrictions Community Activity;Driving;Yard Work;Other;Shop     Stability/Clinical Decision Making Stable/Uncomplicated    Rehab Potential Good    PT Frequency 2x / week    PT Duration 8 weeks    PT Treatment/Interventions ADLs/Self Care Home Management;Electrical Stimulation;Cryotherapy;DME Instruction;Gait training;Stair training;Functional mobility training;Therapeutic activities;Therapeutic exercise;Balance training;Neuromuscular re-education;Patient/family education;Manual techniques;Scar mobilization;Passive range of motion;Dry needling;Taping;Vasopneumatic Device;Vestibular;Joint Manipulations    PT Next Visit Plan discharged on 02/16/2021    PT Home Exercise Plan Access Code: [email or web address]    Consulted and Agree with Plan of Care Patient             Patient will benefit from skilled therapeutic intervention in order to improve the following deficits and impairments:  Abnormal gait, Decreased activity tolerance, Decreased balance, Decreased mobility, Decreased range of motion, Decreased scar mobility, Decreased skin integrity, Decreased strength, Increased edema, Impaired flexibility, Pain  Visit Diagnosis: Acute pain of left knee  Muscle weakness (generalized)  Difficulty in walking, not elsewhere classified  Localized edema  Acute pain of right knee  Unsteadiness on feet   PHYSICAL THERAPY DISCHARGE SUMMARY  Visits from Start of Care: 18  Current functional level related to goals / functional outcomes: See above   Remaining deficits: See above   Education / Equipment: HEP   Patient agrees to discharge. Patient goals were partially met. Patient is being discharged due to being pleased with the current functional level.   Problem List Patient Active Problem List   Diagnosis Date Noted   Primary osteoarthritis of left knee 11/02/2020   Status post left knee replacement 11/02/2020   Status post total right knee replacement 08/10/2020   Primary osteoarthritis of right knee 01/30/2020   Chronic pain of left knee 09/11/2018    Meniscus degeneration, left 09/11/2018   Fibromyalgia 09/11/2018   Chronic pain syndrome 09/11/2018   Venous ulcer ([place]) 02/02/2017   Varicose veins of bilateral lower extremities with pain 05/16/2016   Chronic venous insufficiency 05/16/2016   Pain in limb 05/16/2016   Swelling of limb 05/16/2016   DJD (degenerative joint disease) 05/16/2016    Oretha Caprice, PT, MPT 02/16/2021, 11:01 AM  [facility] Physical Therapy [address] [place], [place], [address] Phone: [phone]   Fax:  [phone]  Name: BRITNIE COLVILLE MRN: [id number] Date of Birth: 1956/05/07

## 2021-02-23 ENCOUNTER — Encounter: Payer: Medicare Other | Admitting: Physical Therapy

## 2021-03-02 ENCOUNTER — Encounter: Payer: Medicare Other | Admitting: Physical Therapy

## 2021-03-16 ENCOUNTER — Ambulatory Visit: Payer: Medicare Other | Attending: Internal Medicine

## 2021-03-16 ENCOUNTER — Other Ambulatory Visit: Payer: Self-pay

## 2021-03-16 DIAGNOSIS — Z23 Encounter for immunization: Secondary | ICD-10-CM

## 2021-03-16 MED ORDER — PFIZER COVID-19 VAC BIVALENT 30 MCG/0.3ML IM SUSP
INTRAMUSCULAR | 0 refills | Status: DC
  Filled 2021-03-16: qty 0.3, 1d supply, fill #0

## 2021-03-16 NOTE — Progress Notes (Signed)
   Covid-19 Vaccination Clinic  Name:  KAYDYN CHISM    MRN: [id number] DOB: January 14, 1956  03/16/2021  Ms. Buckle was observed post Covid-19 immunization for 15 minutes without incident. She was provided with Vaccine Information Sheet and instruction to access the V-Safe system.   Ms. Leis was instructed to call 911 with any severe reactions post vaccine: Difficulty breathing  Swelling of face and throat  A fast heartbeat  A bad rash all over body  Dizziness and weakness   Lu Duffel, PharmD, MBA Clinical Acute Care Pharmacist

## 2021-04-07 ENCOUNTER — Other Ambulatory Visit: Payer: Self-pay | Admitting: Family Medicine

## 2021-04-07 DIAGNOSIS — Z1231 Encounter for screening mammogram for malignant neoplasm of breast: Secondary | ICD-10-CM

## 2021-04-28 ENCOUNTER — Ambulatory Visit: Payer: Self-pay

## 2021-04-28 ENCOUNTER — Ambulatory Visit (INDEPENDENT_AMBULATORY_CARE_PROVIDER_SITE_OTHER): Payer: Medicare Other | Admitting: Orthopaedic Surgery

## 2021-04-28 ENCOUNTER — Other Ambulatory Visit: Payer: Self-pay

## 2021-04-28 ENCOUNTER — Encounter: Payer: Self-pay | Admitting: Orthopaedic Surgery

## 2021-04-28 DIAGNOSIS — Z96652 Presence of left artificial knee joint: Secondary | ICD-10-CM

## 2021-04-28 DIAGNOSIS — Z96651 Presence of right artificial knee joint: Secondary | ICD-10-CM

## 2021-04-28 NOTE — Progress Notes (Signed)
Post-Op Visit Note   Patient: Anna Mccarthy           Date of Birth: 16-Jan-1956           MRN: [id number] Visit Date: 04/28/2021 PCP: Maryland Pink, MD   Assessment & Plan:  Chief Complaint:  Chief Complaint  Patient presents with   Right Knee - Follow-up   Left Knee - Follow-up   Visit Diagnoses:  1. Hx of total knee replacement, right   2. Status post left knee replacement     Plan: [place] is 9 months status post right total knee replacement and 6 months status post left total knee replacement.  Overall she is doing very well and has no complaints in regards to the knees.  She is having some metatarsalgia that she feels is due to alterations in her gait since she has had a knee replacements.  She got inserts from [employer] about 5 months ago.  Bilateral knees show fully healed surgical scars with excellent range of motion.  Stable to varus valgus.  No swelling or joint effusion.  The x-rays demonstrate stable implants without any complications.  She can engage in activity as tolerated.  I recommended that she see Dr. Sharol Given she continues to have foot problems.  From my standpoint we will see her back in 6 months with two-view x-rays of bilateral knees.  Dental prophylaxis reinforced.  Follow-Up Instructions: Return in about 6 months (around 10/26/2021).   Orders:  Orders Placed This Encounter  Procedures   XR Knee 1-2 Views Right   XR Knee 1-2 Views Left   No orders of the defined types were placed in this encounter.   Imaging: XR Knee 1-2 Views Left  Result Date: 04/28/2021 Stable total knee replacement in good alignment.   XR Knee 1-2 Views Right  Result Date: 04/28/2021 Stable total knee replacement in good alignment    PMFS History: Patient Active Problem List   Diagnosis Date Noted   Primary osteoarthritis of left knee 11/02/2020   Status post left knee replacement 11/02/2020   Status post total right knee replacement 08/10/2020   Primary osteoarthritis of  right knee 01/30/2020   Chronic pain of left knee 09/11/2018   Meniscus degeneration, left 09/11/2018   Fibromyalgia 09/11/2018   Chronic pain syndrome 09/11/2018   Venous ulcer ([place]) 02/02/2017   Varicose veins of bilateral lower extremities with pain 05/16/2016   Chronic venous insufficiency 05/16/2016   Pain in limb 05/16/2016   Swelling of limb 05/16/2016   DJD (degenerative joint disease) 05/16/2016   Past Medical History:  Diagnosis Date   Allergy    Anxiety    Arthritis    Bronchitis    Cancer ([place])    skin   Fibromyalgia    GERD (gastroesophageal reflux disease)    Hypertension    Osteoarthritis    Pneumonia     Family History  Problem Relation Age of Onset   Breast cancer Maternal Aunt    Ovarian cancer Sister    Breast cancer Cousin     Past Surgical History:  Procedure Laterality Date   ABDOMINAL HYSTERECTOMY  1996   BREAST BIOPSY Left 2011   stereo done by dr. Bary Castilla -benign   COLONOSCOPY     COLONOSCOPY WITH PROPOFOL N/A 05/30/2016   Procedure: COLONOSCOPY WITH PROPOFOL;  Surgeon: Lollie Sails, MD;  Location: [facility] ENDOSCOPY;  Service: Endoscopy;  Laterality: N/A;   DILATION AND CURETTAGE OF UTERUS     x3  HERNIA REPAIR  2012   JOINT REPLACEMENT     KNEE ARTHROSCOPY WITH MEDIAL MENISECTOMY Left 02/18/2016   Procedure: KNEE ARTHROSCOPY WITH PARTIAL MEDIAL MENISECTOMY;  Surgeon: Hessie Knows, MD;  Location: [facility];  Service: Orthopedics;  Laterality: Left;   TOTAL KNEE ARTHROPLASTY Right 08/10/2020   Procedure: RIGHT TOTAL KNEE ARTHROPLASTY;  Surgeon: Leandrew Koyanagi, MD;  Location: [place];  Service: Orthopedics;  Laterality: Right;   TOTAL KNEE ARTHROPLASTY Left 11/02/2020   Procedure: LEFT TOTAL KNEE ARTHROPLASTY;  Surgeon: Leandrew Koyanagi, MD;  Location: [place];  Service: Orthopedics;  Laterality: Left;   TUBAL LIGATION  1984   VARICOSE VEIN SURGERY Bilateral    Social History   Occupational History   Not on file  Tobacco Use   Smoking status:  Never   Smokeless tobacco: Never  Vaping Use   Vaping Use: Never used  Substance and Sexual Activity   Alcohol use: Yes    Comment: once per month   Drug use: No   Sexual activity: Not on file

## 2021-04-30 ENCOUNTER — Other Ambulatory Visit: Payer: Self-pay

## 2021-04-30 DIAGNOSIS — Z1211 Encounter for screening for malignant neoplasm of colon: Secondary | ICD-10-CM

## 2021-05-06 ENCOUNTER — Other Ambulatory Visit: Payer: Self-pay

## 2021-05-06 ENCOUNTER — Ambulatory Visit
Admission: RE | Admit: 2021-05-06 | Discharge: 2021-05-06 | Disposition: A | Discharge status: Home / Self Care | Payer: Medicare Other | Source: Ambulatory Visit | Attending: Family Medicine | Admitting: Family Medicine

## 2021-05-06 DIAGNOSIS — Z1231 Encounter for screening mammogram for malignant neoplasm of breast: Secondary | ICD-10-CM | POA: Diagnosis not present

## 2021-06-04 ENCOUNTER — Telehealth: Payer: Medicare Other | Admitting: Emergency Medicine

## 2021-06-04 DIAGNOSIS — J208 Acute bronchitis due to other specified organisms: Secondary | ICD-10-CM | POA: Diagnosis not present

## 2021-06-04 DIAGNOSIS — B9689 Other specified bacterial agents as the cause of diseases classified elsewhere: Secondary | ICD-10-CM

## 2021-06-04 MED ORDER — ALBUTEROL SULFATE HFA 108 (90 BASE) MCG/ACT IN AERS: 1.0000 | INHALATION_SPRAY | Freq: Four times a day (QID) | RESPIRATORY_TRACT | 0 refills | Status: DC | PRN

## 2021-06-04 MED ORDER — PREDNISONE 20 MG PO TABS: 20.0000 mg | ORAL_TABLET | Freq: Two times a day (BID) | ORAL | 0 refills | Status: AC

## 2021-06-04 MED ORDER — AZITHROMYCIN 250 MG PO TABS: ORAL_TABLET | ORAL | 0 refills | Status: AC

## 2021-06-04 NOTE — Patient Instructions (Signed)
Anna Mccarthy, thank you for joining [name], PA-C for today's virtual visit.  While this provider is not your primary care provider (PCP), if your PCP is located in our provider database this encounter information will be shared with them immediately following your visit.  Consent: (Patient) Anna Mccarthy provided verbal consent for this virtual visit at the beginning of the encounter.  Current Medications:  Current Outpatient Medications:    albuterol (VENTOLIN HFA) 108 (90 Base) MCG/ACT inhaler, Inhale 1-2 puffs into the lungs every 6 (six) hours as needed for wheezing or shortness of breath., Disp: 18 g, Rfl: 0   azithromycin (ZITHROMAX) 250 MG tablet, Take 2 tablets on day 1, then 1 tablet daily on days 2 through 5, Disp: 6 tablet, Rfl: 0   predniSONE (DELTASONE) 20 MG tablet, Take 1 tablet (20 mg total) by mouth 2 (two) times daily with a meal for 5 days., Disp: 10 tablet, Rfl: 0   ALPRAZolam (XANAX) 0.25 MG tablet, Take 0.25 mg by mouth 2 (two) times daily as needed for anxiety., Disp: , Rfl: 0   aspirin EC 81 MG tablet, Take 1 tablet (81 mg total) by mouth 2 (two) times daily. To be taken after surgery for dvt porphylaxis, Disp: 84 tablet, Rfl: 0   COVID-19 mRNA bivalent vaccine, Pfizer, (PFIZER COVID-19 VAC BIVALENT) injection, Inject into the muscle., Disp: 0.3 mL, Rfl: 0   COVID-19 mRNA Vac-TriS, Pfizer, (PFIZER-BIONT COVID-19 VAC-TRIS) SUSP injection, Inject into the muscle., Disp: 0.3 mL, Rfl: 0   DULoxetine (CYMBALTA) 20 MG capsule, Take 20 mg by mouth 2 (two) times daily., Disp: , Rfl:    escitalopram (LEXAPRO) 20 MG tablet, Take 20 mg by mouth at bedtime. , Disp: , Rfl:    fluocinonide (LIDEX) 0.05 % external solution, Apply 1 application topically 2 (two) times daily as needed (acne)., Disp: , Rfl:    gabapentin (NEURONTIN) 100 MG capsule, Take 2 capsules (200 mg total) by mouth in the morning, at noon, and at bedtime., Disp: 540 capsule, Rfl: 0   hydrochlorothiazide  (HYDRODIURIL) 25 MG tablet, Take 25 mg by mouth daily., Disp: , Rfl:    HYDROcodone-acetaminophen (NORCO) 7.5-325 MG tablet, Take 1-2 tablets by mouth 2 (two) times daily as needed for moderate pain. To be taken after surgery, Disp: 30 tablet, Rfl: 0   hydroxychloroquine (PLAQUENIL) 200 MG tablet, Take 200 mg by mouth 2 (two) times daily., Disp: , Rfl:    omeprazole (PRILOSEC) 40 MG capsule, Take 40 mg by mouth daily., Disp: , Rfl:    tiZANidine (ZANAFLEX) 2 MG tablet, Take 1 tablet (2 mg total) by mouth every 8 (eight) hours as needed for muscle spasms. To be taken after surgery, Disp: 30 tablet, Rfl: 0   Vitamin D, Ergocalciferol, (DRISDOL) 1.25 MG (50000 UNIT) CAPS capsule, Take 50,000 Units by mouth every Sunday., Disp: , Rfl:    Medications ordered in this encounter:  Meds ordered this encounter  Medications   azithromycin (ZITHROMAX) 250 MG tablet    Sig: Take 2 tablets on day 1, then 1 tablet daily on days 2 through 5    Dispense:  6 tablet    Refill:  0    Order Specific Question:   Supervising Provider    Answer:   Sabra Heck, BRIAN [[id number]]   predniSONE (DELTASONE) 20 MG tablet    Sig: Take 1 tablet (20 mg total) by mouth 2 (two) times daily with a meal for 5 days.    Dispense:  10  tablet    Refill:  0    Order Specific Question:   Supervising Provider    Answer:   Sabra Heck, BRIAN [[id number]]   albuterol (VENTOLIN HFA) 108 (90 Base) MCG/ACT inhaler    Sig: Inhale 1-2 puffs into the lungs every 6 (six) hours as needed for wheezing or shortness of breath.    Dispense:  18 g    Refill:  0    Order Specific Question:   Supervising Provider    Answer:   Sabra Heck, Nenzel     *If you need refills on other medications prior to your next appointment, please contact your pharmacy*  Follow-Up: Call back or seek an in-person evaluation if the symptoms worsen or if the condition fails to improve as anticipated.  Other Instructions Get plenty of rest and push fluids Use zyrtec for nasal  congestion, runny nose, and/or sore throat Use flonase for nasal congestion and runny nose Use medications daily for symptom relief Prescribed prednisone and albuterol inhaler for wheezing Z-pak prescribed.  Take as directed and to completion Use OTC medications like ibuprofen or tylenol as needed fever or pain Follow up with PCP in 1-2 days for recheck Follow up in person at urgent care or go to the ED if you have any new or worsening symptoms such as fever, worsening cough, shortness of breath, chest tightness, chest pain, turning blue, changes in mental status, etc...     If you have been instructed to have an in-person evaluation today at a local Urgent Care facility, please use the link below. It will take you to a list of all of our available [facility], including address, phone number and hours of operation. Please do not delay care.  [facility]  If you or a family member do not have a primary care provider, use the link below to schedule a visit and establish care. When you choose a [facility] primary care physician or advanced practice provider, you gain a long-term partner in health. Find a Primary Care Provider  Learn more about [facility]'s in-office and virtual care options: [facility]

## 2021-06-04 NOTE — Progress Notes (Signed)
Virtual Visit Consent   Anna Mccarthy, you are scheduled for a virtual visit with a [facility] provider today.     Just as with appointments in the office, your consent must be obtained to participate.  Your consent will be active for this visit and any virtual visit you may have with one of our providers in the next 365 days.     If you have a MyChart account, a copy of this consent can be sent to you electronically.  All virtual visits are billed to your insurance company just like a traditional visit in the office.    As this is a virtual visit, video technology does not allow for your provider to perform a traditional examination.  This may limit your provider's ability to fully assess your condition.  If your provider identifies any concerns that need to be evaluated in person or the need to arrange testing (such as labs, EKG, etc.), we will make arrangements to do so.     Although advances in technology are sophisticated, we cannot ensure that it will always work on either your end or our end.  If the connection with a video visit is poor, the visit may have to be switched to a telephone visit.  With either a video or telephone visit, we are not always able to ensure that we have a secure connection.     I need to obtain your verbal consent now.   Are you willing to proceed with your visit today? Yes   [name] has provided verbal consent on 06/04/2021 for a virtual visit (video or telephone).   Anna Mccarthy, [place]   Date: 06/04/2021 5:40 PM   Virtual Visit via Video Note   I, Anna Mccarthy, connected with  Anna Mccarthy  ([id number], August 27, 1955) on 06/04/21 at  6:15 PM EST by a video-enabled telemedicine application and verified that I am speaking with the correct person using two identifiers.  Location: Patient: Virtual Visit Location Patient: Home Provider: Virtual Visit Location Provider: Home Office   I discussed the limitations of evaluation and management by  telemedicine and the availability of in person appointments. The patient expressed understanding and agreed to proceed.    History of Present Illness: Anna Mccarthy is a 65 y.o. who identifies as a female who was assigned female at birth, and is being seen today for nasal congestion, dry/ productive cough, sore throat, and wheezing x 5 days.  Reports sick exposure to grand kids.  Has tried OTC medications with minimal relief.  Denies aggravating factors.  Reports previous symptoms in the past with bronchitis.   Denies fever, chills, SOB, chest pain, nausea, changes in bowel or bladder habits.    ROS: As per HPI.  All other pertinent ROS negative.     HPI: HPI  Problems:  Patient Active Problem List   Diagnosis Date Noted   Primary osteoarthritis of left knee 11/02/2020   Status post left knee replacement 11/02/2020   Status post total right knee replacement 08/10/2020   Primary osteoarthritis of right knee 01/30/2020   Chronic pain of left knee 09/11/2018   Meniscus degeneration, left 09/11/2018   Fibromyalgia 09/11/2018   Chronic pain syndrome 09/11/2018   Venous ulcer ([place]) 02/02/2017   Varicose veins of bilateral lower extremities with pain 05/16/2016   Chronic venous insufficiency 05/16/2016   Pain in limb 05/16/2016   Swelling of limb 05/16/2016   DJD (degenerative joint disease) 05/16/2016    Allergies:  Allergies  Allergen Reactions   Percocet [Oxycodone-Acetaminophen] Itching   Sulfa Antibiotics     Gi problems    Medications:  Current Outpatient Medications:    albuterol (VENTOLIN HFA) 108 (90 Base) MCG/ACT inhaler, Inhale 1-2 puffs into the lungs every 6 (six) hours as needed for wheezing or shortness of breath., Disp: 18 g, Rfl: 0   azithromycin (ZITHROMAX) 250 MG tablet, Take 2 tablets on day 1, then 1 tablet daily on days 2 through 5, Disp: 6 tablet, Rfl: 0   predniSONE (DELTASONE) 20 MG tablet, Take 1 tablet (20 mg total) by mouth 2 (two) times daily with a meal  for 5 days., Disp: 10 tablet, Rfl: 0   ALPRAZolam (XANAX) 0.25 MG tablet, Take 0.25 mg by mouth 2 (two) times daily as needed for anxiety., Disp: , Rfl: 0   aspirin EC 81 MG tablet, Take 1 tablet (81 mg total) by mouth 2 (two) times daily. To be taken after surgery for dvt porphylaxis, Disp: 84 tablet, Rfl: 0   COVID-19 mRNA bivalent vaccine, Pfizer, (PFIZER COVID-19 VAC BIVALENT) injection, Inject into the muscle., Disp: 0.3 mL, Rfl: 0   COVID-19 mRNA Vac-TriS, Pfizer, (PFIZER-BIONT COVID-19 VAC-TRIS) SUSP injection, Inject into the muscle., Disp: 0.3 mL, Rfl: 0   DULoxetine (CYMBALTA) 20 MG capsule, Take 20 mg by mouth 2 (two) times daily., Disp: , Rfl:    escitalopram (LEXAPRO) 20 MG tablet, Take 20 mg by mouth at bedtime. , Disp: , Rfl:    fluocinonide (LIDEX) 0.05 % external solution, Apply 1 application topically 2 (two) times daily as needed (acne)., Disp: , Rfl:    gabapentin (NEURONTIN) 100 MG capsule, Take 2 capsules (200 mg total) by mouth in the morning, at noon, and at bedtime., Disp: 540 capsule, Rfl: 0   hydrochlorothiazide (HYDRODIURIL) 25 MG tablet, Take 25 mg by mouth daily., Disp: , Rfl:    HYDROcodone-acetaminophen (NORCO) 7.5-325 MG tablet, Take 1-2 tablets by mouth 2 (two) times daily as needed for moderate pain. To be taken after surgery, Disp: 30 tablet, Rfl: 0   hydroxychloroquine (PLAQUENIL) 200 MG tablet, Take 200 mg by mouth 2 (two) times daily., Disp: , Rfl:    omeprazole (PRILOSEC) 40 MG capsule, Take 40 mg by mouth daily., Disp: , Rfl:    tiZANidine (ZANAFLEX) 2 MG tablet, Take 1 tablet (2 mg total) by mouth every 8 (eight) hours as needed for muscle spasms. To be taken after surgery, Disp: 30 tablet, Rfl: 0   Vitamin D, Ergocalciferol, (DRISDOL) 1.25 MG (50000 UNIT) CAPS capsule, Take 50,000 Units by mouth every Sunday., Disp: , Rfl:   Observations/Objective: Patient is well-developed, well-nourished in no acute distress.  Resting comfortably at home. Mildly  fatigued, but nontoxic Head is normocephalic, atraumatic.  No labored breathing. Speaking in full sentences without difficulty Speech is clear and coherent with logical content.  Patient is alert and oriented at baseline.   Assessment and Plan: 1. Acute bacterial bronchitis Get plenty of rest and push fluids Use zyrtec for nasal congestion, runny nose, and/or sore throat Use flonase for nasal congestion and runny nose Use medications daily for symptom relief Prescribed prednisone and albuterol inhaler for wheezing Z-pak prescribed.  Take as directed and to completion Use OTC medications like ibuprofen or tylenol as needed fever or pain Follow up with PCP in 1-2 days for recheck Follow up in person at urgent care or go to the ED if you have any new or worsening symptoms such as fever, worsening cough,  shortness of breath, chest tightness, chest pain, turning blue, changes in mental status, etc...    Follow Up Instructions: I discussed the assessment and treatment plan with the patient. The patient was provided an opportunity to ask questions and all were answered. The patient agreed with the plan and demonstrated an understanding of the instructions.  A copy of instructions were sent to the patient via MyChart unless otherwise noted below.    The patient was advised to call back or seek an in-person evaluation if the symptoms worsen or if the condition fails to improve as anticipated.  Time:  I spent 10 minutes with the patient via telehealth technology discussing the above problems/concerns.    Anna Box, PA-C

## 2021-09-09 ENCOUNTER — Ambulatory Visit (INDEPENDENT_AMBULATORY_CARE_PROVIDER_SITE_OTHER): Payer: Medicare Other | Admitting: Orthopedic Surgery

## 2021-09-09 ENCOUNTER — Encounter: Payer: Self-pay | Admitting: Orthopedic Surgery

## 2021-09-09 VITALS — BP 156/99 | HR 63 | Ht 62.5 in | Wt 179.0 lb

## 2021-09-09 DIAGNOSIS — R200 Anesthesia of skin: Secondary | ICD-10-CM | POA: Diagnosis not present

## 2021-09-09 NOTE — Progress Notes (Signed)
? ?Office Visit Note ?  ?Patient: Anna Mccarthy           ?Date of Birth: [birth year]           ?MRN: [id number] ?Visit Date: 09/09/2021 ?             ?Requested by: Maryland Pink, MD ?[facility] ?[facility] ?[place],  [place] [address] ?PCP: Maryland Pink, MD ? ? ?Assessment & Plan: ?Visit Diagnoses:  ?1. Bilateral hand numbness   ? ? ?Plan: Discussed with patient that she does have some exam and history findings consistent with carpal tunnel syndrome.  She had an EMG/nerve conduction study done 20+ years ago.  Discussed that I like to repeat this study to further evaluate her symptoms.  We discussed the general nature of carpal tunnel syndrome including its diagnosis, prognosis, and both conservative and surgical treatment options.  We also discussed the nature of thumb CMC arthritis and various treatment options.  We will get x-rays of bilateral hands last year to review the electrodiagnostic study results.  I can see her back in the office once the electrodiagnostic study is complete and we will review the results. ? ?Follow-Up Instructions: No follow-ups on file.  ? ?Orders:  ?Orders Placed This Encounter  ?Procedures  ? Ambulatory referral to Physical Medicine Rehab  ? ?No orders of the defined types were placed in this encounter. ? ? ? ? Procedures: ?No procedures performed ? ? ?Clinical Data: ?No additional findings. ? ? ?Subjective: ?Chief Complaint  ?Patient presents with  ? Right Hand - Numbness, Pain, Weakness  ?  Right handed, onset x 1 year ago, +nt, weakness, swelling, interfering with daily life, wakes, her up, has numbness up the left arm, hx of injury to Right middle finger years ago, she cut it on glass, and when she lifts weights with that hand she gets shooting pain up the right arm  ? Left Hand - Numbness, Pain, Weakness  ? ? ?Is a 66 year old right-hand-dominant female who presents with numbness and paresthesias involving both hands.  Is been going on for at least a year.  She notes  that she has a history of previously diagnosed carpal tunnel syndrome with an EMG/nerve conduction study being performed 20 or 30 years ago.  She describes weakness in all of her fingers with associated numbness and tingling.  She has nocturnal symptoms many nights per week in which she has to stretch her hands or hold them below the level of the bed for symptom relief.  She previously had a corticosteroid action in the both of her carpal tunnels many years ago.  She has tried night splints with some symptom improvement.  She has no numbness currently but does note that she had numbness in the car while driving over here.  She also describes some mild pain at the base of her thumbs with her activities that involve strenuous gripping or pinching.  She has no history of diabetes.  She has no history of hypothyroidism.  She has no history of wrist trauma.  She is no history of inflammatory neuropathy.  She has no history of cervical spine issue.   ? ?Weakness ?Associated symptoms include weakness.  ? ?Review of Systems  ?Neurological:  Positive for weakness.  ? ? ?Objective: ?Vital Signs: BP (!) 156/99 (BP Location: Left Arm, Patient Position: Sitting)   Pulse 63   Ht 5' 2.5" (1.588 m)   Wt 179 lb (81.2 kg)   BMI 32.22 kg/m?  ? ?  Physical Exam ?Constitutional:   ?   Appearance: Normal appearance.  ?Cardiovascular:  ?   Rate and Rhythm: Normal rate.  ?   Pulses: Normal pulses.  ?Pulmonary:  ?   Effort: Pulmonary effort is normal.  ?Skin: ?   General: Skin is warm and dry.  ?   Capillary Refill: Capillary refill takes less than 2 seconds.  ?Neurological:  ?   Mental Status: She is alert.  ? ? ?Right Hand Exam  ? ?Tenderness  ?Right hand tenderness location: TTP at thumb CMC joint. No swelling. ? ?Other  ?Erythema: absent ?Sensation: normal ?Pulse: present ? ?Comments:  Positive Tinel and Phalen signs into index/middle/ring fingers.  5/5 thenar motor strength without atrophy.  Positive CMC grind test w/ pain and mild  crepitus. No static or dynamic MP hyper-extension.  No palmar abduction contracture.  ? ? ?Left Hand Exam  ? ?Tenderness  ?Left hand tenderness location: TTP at thumb CMC joint. No swelling.  ? ?Other  ?Erythema: absent ?Sensation: normal ?Pulse: present ? ?Comments:  Positive Tinel and Phalen signs into index/middle/ring fingers.  5/5 thenar motor strength without atrophy.  Positive CMC grind test w/ pain and mild crepitus. No static or dynamic MP hyper-extension.  No palmar abduction contracture.  ? ? ? ? ?Specialty Comments:  ?No specialty comments available. ? ?Imaging: ?No results found. ? ? ?PMFS History: ?Patient Active Problem List  ? Diagnosis Date Noted  ? Primary osteoarthritis of left knee 11/02/2020  ? Status post left knee replacement 11/02/2020  ? Status post total right knee replacement 08/10/2020  ? Primary osteoarthritis of right knee 01/30/2020  ? Chronic pain of left knee 09/11/2018  ? Meniscus degeneration, left 09/11/2018  ? Fibromyalgia 09/11/2018  ? Chronic pain syndrome 09/11/2018  ? Venous ulcer ([place]) 02/02/2017  ? Varicose veins of bilateral lower extremities with pain 05/16/2016  ? Chronic venous insufficiency 05/16/2016  ? Pain in limb 05/16/2016  ? Swelling of limb 05/16/2016  ? DJD (degenerative joint disease) 05/16/2016  ? ?Past Medical History:  ?Diagnosis Date  ? Allergy   ? Anxiety   ? Arthritis   ? Bronchitis   ? Cancer [facility])   ? skin  ? Fibromyalgia   ? GERD (gastroesophageal reflux disease)   ? Hypertension   ? Osteoarthritis   ? Pneumonia   ?  ?Family History  ?Problem Relation Age of Onset  ? Breast cancer Maternal Aunt   ? Ovarian cancer Sister   ? Breast cancer Cousin   ?  ?Past Surgical History:  ?Procedure Laterality Date  ? ABDOMINAL HYSTERECTOMY  1996  ? BREAST BIOPSY Left 2011  ? stereo done by dr. Bary Castilla -benign  ? COLONOSCOPY    ? COLONOSCOPY WITH PROPOFOL N/A 05/30/2016  ? Procedure: COLONOSCOPY WITH PROPOFOL;  Surgeon: Lollie Sails, MD;  [facility]  ENDOSCOPY;  Service: Endoscopy;  Laterality: N/A;  ? DILATION AND CURETTAGE OF UTERUS    ? x3  ? HERNIA REPAIR  2012  ? JOINT REPLACEMENT    ? KNEE ARTHROSCOPY WITH MEDIAL MENISECTOMY Left 02/18/2016  ? Procedure: KNEE ARTHROSCOPY WITH PARTIAL MEDIAL MENISECTOMY;  Surgeon: Hessie Knows, MD;  Location: [facility] OR[facility];  Service: Orthopedics;  Laterality: Left;  ? TOTAL KNEE ARTHROPLASTY Right 08/10/2020  ? Procedure: RIGHT TOTAL KNEE ARTHROPLASTY;  Surgeon: Leandrew Koyanagi, MD;  Location: [facility];  Service: Orthopedics;  Laterality: Right;  ? TOTAL KNEE ARTHROPLASTY Left 11/02/2020  ? Procedure: LEFT TOTAL KNEE ARTHROPLASTY;  Surgeon: Leandrew Koyanagi,  MD;  Location: [facility];  Service: Orthopedics;  Laterality: Left;  ? TUBAL LIGATION  1984  ? VARICOSE VEIN SURGERY Bilateral   ? ?Social History  ? ?Occupational History  ? Not on file  ?Tobacco Use  ? Smoking status: Never  ? Smokeless tobacco: Never  ?Vaping Use  ? Vaping Use: Never used  ?Substance and Sexual Activity  ? Alcohol use: Yes  ?  Comment: once per month  ? Drug use: No  ? Sexual activity: Not on file  ? ? ? ? ? ? ?

## 2021-09-24 ENCOUNTER — Ambulatory Visit (INDEPENDENT_AMBULATORY_CARE_PROVIDER_SITE_OTHER): Payer: Medicare Other | Admitting: Physical Medicine and Rehabilitation

## 2021-09-24 DIAGNOSIS — R202 Paresthesia of skin: Secondary | ICD-10-CM | POA: Diagnosis not present

## 2021-09-24 NOTE — Progress Notes (Signed)
Numbness, weakness and tingling radiating up each arm. ?

## 2021-09-26 NOTE — Progress Notes (Signed)
? ?Anna Mccarthy - 66 y.o. female MRN [id number]  Date of birth: [birth year] ? ?Office Visit Note: ?Visit Date: 09/24/2021 ?PCP: Maryland Pink, MD ?Referred by: Sherilyn Cooter, MD ? ?Subjective: ?Chief Complaint  ?Patient presents with  ? Right Hand - Numbness, Pain  ? Left Hand - Numbness, Pain  ? ?HPI:  Anna Mccarthy is a 66 y.o. female who comes in today at the request of Dr. Sherilyn Cooter for electrodiagnostic study of the Bilateral upper extremities.  Patient is Right hand dominant.  She complains of about 1 year of chronic worsening severe bilateral hand pain and numbness and tingling.  She has a history of carpal tunnel syndrome diagnosed with electrodiagnostic study some 20 years ago.  No prior release but she did have carpal tunnel injections with good relief.  Has been using braces without much relief lately.  She denies any frank radicular symptoms. ? ?ROS Otherwise per HPI. ? ?Assessment & Plan: ?Visit Diagnoses:  ?  ICD-10-CM   ?1. Paresthesia of skin  R20.2 NCV with EMG (electromyography)  ?  ?  ?Plan: Impression: ?The above electrodiagnostic study is ABNORMAL and reveals evidence of a severe Bilateral median nerve entrapment at the wrist (carpal tunnel syndrome) affecting sensory and motor components.  ? ?There is no significant electrodiagnostic evidence of any other focal nerve entrapment, brachial plexopathy or cervical radiculopathy.  ? ?Recommendations: ?1.  Follow-up with referring physician. ?2.  Continue current management of symptoms. ?3.  Continue use of resting splint at night-time and as needed during the day. ?4.  Suggest surgical evaluation. ? ?Meds & Orders: No orders of the defined types were placed in this encounter. ?  ?Orders Placed This Encounter  ?Procedures  ? NCV with EMG (electromyography)  ?  ?Follow-up: Return in about 2 weeks (around 10/08/2021) for Sherilyn Cooter, MD.  ? ?Procedures: ?No procedures performed  ?EMG & NCV Findings: ?Evaluation of the left median motor  and the right median motor nerves showed prolonged distal onset latency (L5.2, R7.0 ms), reduced amplitude (L4.0, R2.9 mV), and decreased conduction velocity (Elbow-Wrist, L37, R35 m/s).  The left median (across palm) sensory nerve showed prolonged distal peak latency (Wrist, 4.8 ms) and prolonged distal peak latency (Palm, 2.3 ms).  The right median (across palm) sensory nerve showed prolonged distal peak latency (Wrist, 5.6 ms), reduced amplitude (9.8 ?V), and prolonged distal peak latency (Palm, 5.1 ms).  All remaining nerves (as indicated in the following tables) were within normal limits.  Left vs. Right side comparison data for the median motor nerve indicates abnormal L-R latency difference (1.8 ms).  All remaining left vs. right side differences were within normal limits.   ? ?Needle evaluation of the right abductor pollicis brevis muscle showed increased insertional activity and slightly increased spontaneous activity.  All remaining muscles (as indicated in the following table) showed no evidence of electrical instability.   ? ?Impression: ?The above electrodiagnostic study is ABNORMAL and reveals evidence of a severe Bilateral median nerve entrapment at the wrist (carpal tunnel syndrome) affecting sensory and motor components.  ? ?There is no significant electrodiagnostic evidence of any other focal nerve entrapment, brachial plexopathy or cervical radiculopathy.  ? ?Recommendations: ?1.  Follow-up with referring physician. ?2.  Continue current management of symptoms. ?3.  Continue use of resting splint at night-time and as needed during the day. ?4.  Suggest surgical evaluation. ? ?___________________________ ?Laurence Spates FAAPMR ?Board Certified, Tax adviser of Physical Medicine and Rehabilitation ? ? ? ?Nerve Conduction Studies ?  Anti Sensory Summary Table ? ? Stim Site NR Peak (ms) Norm Peak (ms) P-T Amp (?V) Norm P-T Amp Site1 Site2 Delta-P (ms) Dist (cm) Vel (m/s) Norm Vel (m/s)  ?Left Median Acr  Palm Anti Sensory (2nd Digit)  31.1?C  ?Wrist    *4.8 <3.6 25.2 >10 Wrist Palm 2.5 0.0    ?Palm    *2.3 <2.0 24.6         ?Right Median Acr Palm Anti Sensory (2nd Digit)  31.2?C  ?Wrist    *5.6 <3.6 *9.8 >10 Wrist Palm 0.5 0.0    ?Palm    *5.1 <2.0 35.8         ?Left Radial Anti Sensory (Base 1st Digit)  30.9?C  ?Wrist    2.1 <3.1 22.6  Wrist Base 1st Digit 2.1 0.0    ?Right Radial Anti Sensory (Base 1st Digit)  31.3?C  ?Wrist    2.0 <3.1 23.9  Wrist Base 1st Digit 2.0 0.0    ?Left Ulnar Anti Sensory (5th Digit)  31.2?C  ?Wrist    3.6 <3.7 30.5 >15.0 Wrist 5th Digit 3.6 14.0 39 >38  ?Right Ulnar Anti Sensory (5th Digit)  31.4?C  ?Wrist    3.4 <3.7 20.6 >15.0 Wrist 5th Digit 3.4 14.0 41 >38  ? ?Motor Summary Table ? ? Stim Site NR Onset (ms) Norm Onset (ms) O-P Amp (mV) Norm O-P Amp Site1 Site2 Delta-0 (ms) Dist (cm) Vel (m/s) Norm Vel (m/s)  ?Left Median Motor (Abd Poll Brev)  31?C    Martin-Gruber  ?Wrist    *5.2 <4.2 *4.0 >5 Elbow Wrist 4.9 18.0 *37 >50  ?Elbow    10.1  4.0         ?Right Median Motor (Abd Poll Brev)  31.2?C  ?Wrist    *7.0 <4.2 *2.9 >5 Elbow Wrist 4.8 17.0 *35 >50  ?Elbow    11.8  2.7         ?Left Ulnar Motor (Abd Dig Min)  31.1?C  ?Wrist    3.0 <4.2 10.6 >3 B Elbow Wrist 2.8 17.5 63 >53  ?B Elbow    5.8  9.0  A Elbow B Elbow 1.3 10.0 77 >53  ?A Elbow    7.1  7.6         ?Right Ulnar Motor (Abd Dig Min)  31.3?C  ?Wrist    3.0 <4.2 10.3 >3 B Elbow Wrist 2.7 18.5 69 >53  ?B Elbow    5.7  8.4  A Elbow B Elbow 1.3 10.0 77 >53  ?A Elbow    7.0  8.0         ? ?EMG ? ? Side Muscle Nerve Root Ins Act Fibs Psw Amp Dur Poly Recrt Int Fraser Din Comment  ?Right Abd Poll Brev Median C8-T1 *Incr *1+ *1+ Nml Nml 0 Nml Nml   ?Right 1stDorInt Ulnar C8-T1 Nml Nml Nml Nml Nml 0 Nml Nml   ?Right PronatorTeres Median C6-7 Nml Nml Nml Nml Nml 0 Nml Nml   ?Right Biceps Musculocut C5-6 Nml Nml Nml Nml Nml 0 Nml Nml   ?Right Deltoid Axillary C5-6 Nml Nml Nml Nml Nml 0 Nml Nml   ? ? ?Nerve Conduction Studies ?Anti Sensory  Left/Right Comparison ? ? Stim Site L Lat (ms) R Lat (ms) L-R Lat (ms) L Amp (?V) R Amp (?V) L-R Amp (%) Site1 Site2 L Vel (m/s) R Vel (m/s) L-R Vel (m/s)  ?Median Acr Palm Anti Sensory (2nd Digit)  31.1?C  ?Wrist *4.8 *5.6  0.8 25.2 *9.8 61.1 Wrist Palm     ?Palm *2.3 *5.1 2.8 24.6 35.8 31.3       ?Radial Anti Sensory (Base 1st Digit)  30.9?C  ?Wrist 2.1 2.0 0.1 22.6 23.9 5.4 Wrist Base 1st Digit     ?Ulnar Anti Sensory (5th Digit)  31.2?C  ?Wrist 3.6 3.4 0.2 30.5 20.6 32.5 Wrist 5th Digit 39 41 2  ? ?Motor Left/Right Comparison ? ? Stim Site L Lat (ms) R Lat (ms) L-R Lat (ms) L Amp (mV) R Amp (mV) L-R Amp (%) Site1 Site2 L Vel (m/s) R Vel (m/s) L-R Vel (m/s)  ?Median Motor (Abd Poll Brev)  31?C    Martin-Gruber  ?Wrist *5.2 *7.0 *1.8 *4.0 *2.9 27.5 Elbow Wrist *37 *35 2  ?Elbow 10.1 11.8 1.7 4.0 2.7 32.5       ?Ulnar Motor (Abd Dig Min)  31.1?C  ?Wrist 3.0 3.0 0.0 10.6 10.3 2.8 B Elbow Wrist 63 69 6  ?B Elbow 5.8 5.7 0.1 9.0 8.4 6.7 A Elbow B Elbow 77 77 0  ?A Elbow 7.1 7.0 0.1 7.6 8.0 5.0       ? ? ? ?Waveforms: ?    ? ?    ? ?    ? ?  ? ?  ? ?Clinical History: ?No specialty comments available.  ? ? ? ?Objective:  VS:  HT:    WT:   BMI:     BP:   HR: bpm  TEMP: ( )  RESP:  ?Physical Exam ?Musculoskeletal:     ?   General: No swelling, tenderness or deformity.  ?   Comments: Inspection reveals no atrophy of the bilateral APB or FDI or hand intrinsics. There is no swelling, color changes, allodynia or dystrophic changes. There is 5 out of 5 strength in the bilateral wrist extension, finger abduction and long finger flexion. There is decreased sensation to light touch in the right median nerve distribution. There is a negative Hoffmann's test bilaterally.  ?Skin: ?   General: Skin is warm and dry.  ?   Findings: No erythema or rash.  ?Neurological:  ?   General: No focal deficit present.  ?   Mental Status: She is alert and oriented to person, place, and time.  ?   Motor: No weakness or abnormal muscle tone.  ?    Coordination: Coordination normal.  ?Psychiatric:     ?   Mood and Affect: Mood normal.     ?   Behavior: Behavior normal.  ?  ? ?Imaging: ?No results found. ?

## 2021-09-26 NOTE — Procedures (Signed)
EMG & NCV Findings: ?Evaluation of the left median motor and the right median motor nerves showed prolonged distal onset latency (L5.2, R7.0 ms), reduced amplitude (L4.0, R2.9 mV), and decreased conduction velocity (Elbow-Wrist, L37, R35 m/s).  The left median (across palm) sensory nerve showed prolonged distal peak latency (Wrist, 4.8 ms) and prolonged distal peak latency (Palm, 2.3 ms).  The right median (across palm) sensory nerve showed prolonged distal peak latency (Wrist, 5.6 ms), reduced amplitude (9.8 ?V), and prolonged distal peak latency (Palm, 5.1 ms).  All remaining nerves (as indicated in the following tables) were within normal limits.  Left vs. Right side comparison data for the median motor nerve indicates abnormal L-R latency difference (1.8 ms).  All remaining left vs. right side differences were within normal limits.   ? ?Needle evaluation of the right abductor pollicis brevis muscle showed increased insertional activity and slightly increased spontaneous activity.  All remaining muscles (as indicated in the following table) showed no evidence of electrical instability.   ? ?Impression: ?The above electrodiagnostic study is ABNORMAL and reveals evidence of a severe Bilateral median nerve entrapment at the wrist (carpal tunnel syndrome) affecting sensory and motor components.  ? ?There is no significant electrodiagnostic evidence of any other focal nerve entrapment, brachial plexopathy or cervical radiculopathy.  ? ?Recommendations: ?1.  Follow-up with referring physician. ?2.  Continue current management of symptoms. ?3.  Continue use of resting splint at night-time and as needed during the day. ?4.  Suggest surgical evaluation. ? ?___________________________ ?Laurence Spates FAAPMR ?Board Certified, Tax adviser of Physical Medicine and Rehabilitation ? ? ? ?Nerve Conduction Studies ?Anti Sensory Summary Table ? ? Stim Site NR Peak (ms) Norm Peak (ms) P-T Amp (?V) Norm P-T Amp Site1 Site2 Delta-P  (ms) Dist (cm) Vel (m/s) Norm Vel (m/s)  ?Left Median Acr Palm Anti Sensory (2nd Digit)  31.1?C  ?Wrist    *4.8 <3.6 25.2 >10 Wrist Palm 2.5 0.0    ?Palm    *2.3 <2.0 24.6         ?Right Median Acr Palm Anti Sensory (2nd Digit)  31.2?C  ?Wrist    *5.6 <3.6 *9.8 >10 Wrist Palm 0.5 0.0    ?Palm    *5.1 <2.0 35.8         ?Left Radial Anti Sensory (Base 1st Digit)  30.9?C  ?Wrist    2.1 <3.1 22.6  Wrist Base 1st Digit 2.1 0.0    ?Right Radial Anti Sensory (Base 1st Digit)  31.3?C  ?Wrist    2.0 <3.1 23.9  Wrist Base 1st Digit 2.0 0.0    ?Left Ulnar Anti Sensory (5th Digit)  31.2?C  ?Wrist    3.6 <3.7 30.5 >15.0 Wrist 5th Digit 3.6 14.0 39 >38  ?Right Ulnar Anti Sensory (5th Digit)  31.4?C  ?Wrist    3.4 <3.7 20.6 >15.0 Wrist 5th Digit 3.4 14.0 41 >38  ? ?Motor Summary Table ? ? Stim Site NR Onset (ms) Norm Onset (ms) O-P Amp (mV) Norm O-P Amp Site1 Site2 Delta-0 (ms) Dist (cm) Vel (m/s) Norm Vel (m/s)  ?Left Median Motor (Abd Poll Brev)  31?C    Martin-Gruber  ?Wrist    *5.2 <4.2 *4.0 >5 Elbow Wrist 4.9 18.0 *37 >50  ?Elbow    10.1  4.0         ?Right Median Motor (Abd Poll Brev)  31.2?C  ?Wrist    *7.0 <4.2 *2.9 >5 Elbow Wrist 4.8 17.0 *35 >50  ?Elbow    11.8  2.7         ?Left Ulnar Motor (Abd Dig Min)  31.1?C  ?Wrist    3.0 <4.2 10.6 >3 B Elbow Wrist 2.8 17.5 63 >53  ?B Elbow    5.8  9.0  A Elbow B Elbow 1.3 10.0 77 >53  ?A Elbow    7.1  7.6         ?Right Ulnar Motor (Abd Dig Min)  31.3?C  ?Wrist    3.0 <4.2 10.3 >3 B Elbow Wrist 2.7 18.5 69 >53  ?B Elbow    5.7  8.4  A Elbow B Elbow 1.3 10.0 77 >53  ?A Elbow    7.0  8.0         ? ?EMG ? ? Side Muscle Nerve Root Ins Act Fibs Psw Amp Dur Poly Recrt Int Fraser Din Comment  ?Right Abd Poll Brev Median C8-T1 *Incr *1+ *1+ Nml Nml 0 Nml Nml   ?Right 1stDorInt Ulnar C8-T1 Nml Nml Nml Nml Nml 0 Nml Nml   ?Right PronatorTeres Median C6-7 Nml Nml Nml Nml Nml 0 Nml Nml   ?Right Biceps Musculocut C5-6 Nml Nml Nml Nml Nml 0 Nml Nml   ?Right Deltoid Axillary C5-6 Nml Nml Nml Nml Nml 0  Nml Nml   ? ? ?Nerve Conduction Studies ?Anti Sensory Left/Right Comparison ? ? Stim Site L Lat (ms) R Lat (ms) L-R Lat (ms) L Amp (?V) R Amp (?V) L-R Amp (%) Site1 Site2 L Vel (m/s) R Vel (m/s) L-R Vel (m/s)  ?Median Acr Palm Anti Sensory (2nd Digit)  31.1?C  ?Wrist *4.8 *5.6 0.8 25.2 *9.8 61.1 Wrist Palm     ?Palm *2.3 *5.1 2.8 24.6 35.8 31.3       ?Radial Anti Sensory (Base 1st Digit)  30.9?C  ?Wrist 2.1 2.0 0.1 22.6 23.9 5.4 Wrist Base 1st Digit     ?Ulnar Anti Sensory (5th Digit)  31.2?C  ?Wrist 3.6 3.4 0.2 30.5 20.6 32.5 Wrist 5th Digit 39 41 2  ? ?Motor Left/Right Comparison ? ? Stim Site L Lat (ms) R Lat (ms) L-R Lat (ms) L Amp (mV) R Amp (mV) L-R Amp (%) Site1 Site2 L Vel (m/s) R Vel (m/s) L-R Vel (m/s)  ?Median Motor (Abd Poll Brev)  31?C    Martin-Gruber  ?Wrist *5.2 *7.0 *1.8 *4.0 *2.9 27.5 Elbow Wrist *37 *35 2  ?Elbow 10.1 11.8 1.7 4.0 2.7 32.5       ?Ulnar Motor (Abd Dig Min)  31.1?C  ?Wrist 3.0 3.0 0.0 10.6 10.3 2.8 B Elbow Wrist 63 69 6  ?B Elbow 5.8 5.7 0.1 9.0 8.4 6.7 A Elbow B Elbow 77 77 0  ?A Elbow 7.1 7.0 0.1 7.6 8.0 5.0       ? ? ? ?Waveforms: ?    ? ?    ? ?    ? ?  ? ? ?

## 2021-10-07 ENCOUNTER — Encounter: Payer: Self-pay | Admitting: Orthopedic Surgery

## 2021-10-07 ENCOUNTER — Ambulatory Visit (INDEPENDENT_AMBULATORY_CARE_PROVIDER_SITE_OTHER): Payer: Medicare Other | Admitting: Orthopedic Surgery

## 2021-10-07 DIAGNOSIS — G5601 Carpal tunnel syndrome, right upper limb: Secondary | ICD-10-CM

## 2021-10-07 DIAGNOSIS — G5602 Carpal tunnel syndrome, left upper limb: Secondary | ICD-10-CM | POA: Diagnosis not present

## 2021-10-07 DIAGNOSIS — G5603 Carpal tunnel syndrome, bilateral upper limbs: Secondary | ICD-10-CM

## 2021-10-07 MED ORDER — BETAMETHASONE SOD PHOS & ACET 6 (3-3) MG/ML IJ SUSP
6.0000 mg | INTRAMUSCULAR | Status: AC | PRN
  Administered 2021-10-07: 6 mg via INTRA_ARTICULAR

## 2021-10-07 MED ORDER — LIDOCAINE HCL 1 % IJ SOLN
1.0000 mL | INTRAMUSCULAR | Status: AC | PRN
  Administered 2021-10-07: 1 mL

## 2021-10-07 NOTE — Progress Notes (Signed)
? ?Office Visit Note ?  ?Patient: Anna Mccarthy           ?Date of Birth: 1955/09/12           ?MRN: [id number] ?Visit Date: 10/07/2021 ?             ?Requested by: Maryland Pink, MD ?[place] ?[facility] ?[place],  [place] [id number] ?PCP: Maryland Pink, MD ? ? ?Assessment & Plan: ?Visit Diagnoses:  ?1. Bilateral carpal tunnel syndrome   ? ? ?Plan: We reviewed patient's electrodiagnostic study results which were notable for severe bilateral carpal tunnel syndrome.  We again discussed the treatment options for carpal tunnel syndrome including night splinting, corticosteroid injection, or carpal tunnel release.  Patient is going on a trip in a week and will be back in the beginning of May.  She would like to have her right carpal tunnel released when she returns from her trip.  In the meantime, she would like a corticosteroid injection in the left carpal tunnel to hopefully improve her symptoms during the trip and until she can have the left side released.  We discussed the risk of surgery including but not limited to bleeding, infection, damage to neurovascular structures, incomplete symptom relief, and need for additional surgery.  A surgical date and time will be confirmed with the patient. ? ?Follow-Up Instructions: No follow-ups on file.  ? ?Orders:  ?No orders of the defined types were placed in this encounter. ? ?No orders of the defined types were placed in this encounter. ? ? ? ? Procedures: ?Hand/UE Inj: L carpal tunnel for carpal tunnel syndrome on 10/07/2021 2:53 PM ?Indications: therapeutic ?Details: 25 G needle, volar approach ?Medications: 1 mL lidocaine 1 %; 6 mg betamethasone acetate-betamethasone sodium phosphate 6 (3-3) MG/ML ?Procedure, treatment alternatives, risks and benefits explained, specific risks discussed. Consent was given by the patient. Immediately prior to procedure a time out was called to verify the correct patient, procedure, equipment, support staff and site/side marked  as required. Patient was prepped and draped in the usual sterile fashion.  ? ? ? ? ?Clinical Data: ?No additional findings. ? ? ?Subjective: ?Chief Complaint  ?Patient presents with  ?? Left Hand - Follow-up  ?? Right Hand - Follow-up  ? ? ?This is a 66 year old right-hand-dominant female presents for follow-up of numbness and tingling involving both hands.  She recently underwent EMG/nerve conduction study which suggested bilateral severe carpal tunnel syndrome.  Her symptoms been going on for many years now.  She describes continued numbness and bilateral hands that is worse with certain activities such as driving a car.  She describes weakness with fine motor tasks such as doing a button or sewing.  This wakes her up from sleep every night.  She describes numbness in the thumb, index, middle, and ring finger. ? ? ?Review of Systems ? ? ?Objective: ?Vital Signs: There were no vitals taken for this visit. ? ?Physical Exam ?Constitutional:   ?   Appearance: Normal appearance.  ?Cardiovascular:  ?   Rate and Rhythm: Normal rate.  ?   Pulses: Normal pulses.  ?Pulmonary:  ?   Effort: Pulmonary effort is normal.  ?Skin: ?   General: Skin is warm and dry.  ?   Capillary Refill: Capillary refill takes less than 2 seconds.  ?Neurological:  ?   Mental Status: She is alert.  ? ? ?Right Hand Exam  ? ?Tenderness  ?The patient is experiencing no tenderness.  ? ?Range of Motion  ?  The patient has normal right wrist ROM.  ? ?Tests  ?Phalen?s sign: positive ?Tinel's sign (median nerve): positive ? ?Other  ?Erythema: absent ?Sensation: normal ?Pulse: present ? ?Comments:  5/5 thenar motor strength without atrophy.  ? ? ?Left Hand Exam  ? ?Tenderness  ?The patient is experiencing no tenderness.  ? ?Range of Motion  ?The patient has normal left wrist ROM. ? ?Tests  ?Phalen?s sign: positive ?Tinel's sign (median nerve): positive ? ?Other  ?Erythema: absent ?Sensation: normal ?Pulse: present ? ?Comments:  5/5 thenar motor strength  without atrophy.  ? ? ? ? ?Specialty Comments:  ?No specialty comments available. ? ?Imaging: ?No results found. ? ? ?PMFS History: ?Patient Active Problem List  ? Diagnosis Date Noted  ?? Bilateral carpal tunnel syndrome 10/07/2021  ?? Primary osteoarthritis of left knee 11/02/2020  ?? Status post left knee replacement 11/02/2020  ?? Status post total right knee replacement 08/10/2020  ?? Primary osteoarthritis of right knee 01/30/2020  ?? Chronic pain of left knee 09/11/2018  ?? Meniscus degeneration, left 09/11/2018  ?? Fibromyalgia 09/11/2018  ?? Chronic pain syndrome 09/11/2018  ?? Venous ulcer ([place]) 02/02/2017  ?? Varicose veins of bilateral lower extremities with pain 05/16/2016  ?? Chronic venous insufficiency 05/16/2016  ?? Pain in limb 05/16/2016  ?? Swelling of limb 05/16/2016  ?? DJD (degenerative joint disease) 05/16/2016  ? ?Past Medical History:  ?Diagnosis Date  ?? Allergy   ?? Anxiety   ?? Arthritis   ?? Bronchitis   ?? [facility])   ? skin  ?? Fibromyalgia   ?? GERD (gastroesophageal reflux disease)   ?? Hypertension   ?? Osteoarthritis   ?? Pneumonia   ?  ?Family History  ?Problem Relation Age of Onset  ?? Breast cancer Maternal Aunt   ?? Ovarian cancer Sister   ?? Breast cancer Cousin   ?  ?Past Surgical History:  ?Procedure Laterality Date  ?? ABDOMINAL HYSTERECTOMY  1996  ?? BREAST BIOPSY Left 2011  ? stereo done by dr. Bary Castilla -benign  ?? COLONOSCOPY    ?? COLONOSCOPY WITH PROPOFOL N/A 05/30/2016  ? Procedure: COLONOSCOPY WITH PROPOFOL;  Surgeon: Lollie Sails, MD;  Location: [facility] ENDOSCOPY;  Service: Endoscopy;  Laterality: N/A;  ?? DILATION AND CURETTAGE OF UTERUS    ? x3  ?? HERNIA REPAIR  2012  ?? JOINT REPLACEMENT    ?? KNEE ARTHROSCOPY WITH MEDIAL MENISECTOMY Left 02/18/2016  ? Procedure: KNEE ARTHROSCOPY WITH PARTIAL MEDIAL MENISECTOMY;  Surgeon: Hessie Knows, MD;  Location: [facility] ORS;  Service: Orthopedics;  Laterality: Left;  ?? TOTAL KNEE ARTHROPLASTY Right 08/10/2020  ? Procedure:  RIGHT TOTAL KNEE ARTHROPLASTY;  Surgeon: Leandrew Koyanagi, MD;  Location: [place];  Service: Orthopedics;  Laterality: Right;  ?? TOTAL KNEE ARTHROPLASTY Left 11/02/2020  ? Procedure: LEFT TOTAL KNEE ARTHROPLASTY;  Surgeon: Leandrew Koyanagi, MD;  Location: [place];  Service: Orthopedics;  Laterality: Left;  ?? TUBAL LIGATION  1984  ?? VARICOSE VEIN SURGERY Bilateral   ? ?Social History  ? ?Occupational History  ?? Not on file  ?Tobacco Use  ?? Smoking status: Never  ?? Smokeless tobacco: Never  ?Vaping Use  ?? Vaping Use: Never used  ?Substance and Sexual Activity  ?? Alcohol use: Yes  ?  Comment: once per month  ?? Drug use: No  ?? Sexual activity: Not on file  ? ? ? ? ? ? ?

## 2021-10-31 ENCOUNTER — Telehealth: Payer: Medicare Other | Admitting: Nurse Practitioner

## 2021-10-31 DIAGNOSIS — J208 Acute bronchitis due to other specified organisms: Secondary | ICD-10-CM | POA: Diagnosis not present

## 2021-10-31 DIAGNOSIS — B9689 Other specified bacterial agents as the cause of diseases classified elsewhere: Secondary | ICD-10-CM | POA: Diagnosis not present

## 2021-10-31 MED ORDER — PREDNISONE 5 MG PO TABS: 5.0000 mg | ORAL_TABLET | Freq: Every day | ORAL | 0 refills | Status: AC

## 2021-10-31 MED ORDER — AZITHROMYCIN 250 MG PO TABS
ORAL_TABLET | ORAL | 0 refills | Status: AC
Start: 2021-10-31 — End: 2021-11-05

## 2021-10-31 NOTE — Progress Notes (Signed)
We are sorry that you are not feeling well.  Here is how we plan to help! ? ?Based on your presentation I believe you most likely have A cough due to bacteria.  When patients have a fever and a productive cough with a change in color or increased sputum production, we are concerned about bacterial bronchitis.  If left untreated it can progress to pneumonia.  If your symptoms do not improve with your treatment plan it is important that you contact your provider.   I have prescribed Azithromyin 250 mg: two tablets now and then one tablet daily for 4 additonal days  ?  ?In addition you may use A non-prescription cough medication called Mucinex DM: take 2 tablets every 12 hours. ? ?Prednisone 5 mg daily for 6 days ? ?From your responses in the eVisit questionnaire you describe inflammation in the upper respiratory tract which is causing a significant cough.  This is commonly called Bronchitis and has four common causes:   ?Allergies ?Viral Infections ?Acid Reflux ?Bacterial Infection ?Allergies, viruses and acid reflux are treated by controlling symptoms or eliminating the cause. An example might be a cough caused by taking certain blood pressure medications. You stop the cough by changing the medication. Another example might be a cough caused by acid reflux. Controlling the reflux helps control the cough. ? ?USE OF BRONCHODILATOR ("RESCUE") INHALERS: ?There is a risk from using your bronchodilator too frequently.  The risk is that over-reliance on a medication which only relaxes the muscles surrounding the breathing tubes can reduce the effectiveness of medications prescribed to reduce swelling and congestion of the tubes themselves.  Although you feel brief relief from the bronchodilator inhaler, your asthma may actually be worsening with the tubes becoming more swollen and filled with mucus.  This can delay other crucial treatments, such as oral steroid medications. If you need to use a bronchodilator inhaler daily,  several times per day, you should discuss this with your provider.  There are probably better treatments that could be used to keep your asthma under control.  ?   ?HOME CARE ?Only take medications as instructed by your medical team. ?Complete the entire course of an antibiotic. ?Drink plenty of fluids and get plenty of rest. ?Avoid close contacts especially the very young and the elderly ?Cover your mouth if you cough or cough into your sleeve. ?Always remember to wash your hands ?A steam or ultrasonic humidifier can help congestion.  ? ?GET HELP RIGHT AWAY IF: ?You develop worsening fever. ?You become short of breath ?You cough up blood. ?Your symptoms persist after you have completed your treatment plan ?MAKE SURE YOU  ?Understand these instructions. ?Will watch your condition. ?Will get help right away if you are not doing well or get worse. ?  ? ?Thank you for choosing an e-visit. ? ?Your e-visit answers were reviewed by a board certified advanced clinical practitioner to complete your personal care plan. Depending upon the condition, your plan could have included both over the counter or prescription medications. ? ?Please review your pharmacy choice. Make sure the pharmacy is open so you can pick up prescription now. If there is a problem, you may contact your provider through [employer] and have the prescription routed to another pharmacy.  Your safety is important to [place]. If you have drug allergies check your prescription carefully.  ? ?For the next 24 hours you can use MyChart to ask questions about today's visit, request a non-urgent call back, or ask for  a work or school excuse. ?You will get an email in the next two days asking about your experience. I hope that your e-visit has been valuable and will speed your recovery.  ?

## 2021-10-31 NOTE — Progress Notes (Signed)
I have spent 5 minutes in review of e-visit questionnaire, review and updating patient chart, medical decision making and response to patient.  ° °Anna Mccarthy Naseem Adler, NP ° °  °

## 2021-11-02 ENCOUNTER — Ambulatory Visit: Payer: Medicare Other | Admitting: Orthopaedic Surgery

## 2021-11-03 NOTE — Progress Notes (Signed)
Recent URI on antibiotics and steroids. Will need to be rescheduled for at least 2 weeks out to complete those prescriptions. Office and patient are both aware.  ?

## 2021-11-06 ENCOUNTER — Encounter: Payer: Self-pay | Admitting: Urgent Care

## 2021-11-06 ENCOUNTER — Telehealth: Payer: Medicare Other | Admitting: Urgent Care

## 2021-11-06 DIAGNOSIS — R0981 Nasal congestion: Secondary | ICD-10-CM | POA: Diagnosis not present

## 2021-11-06 DIAGNOSIS — J209 Acute bronchitis, unspecified: Secondary | ICD-10-CM

## 2021-11-06 DIAGNOSIS — J3489 Other specified disorders of nose and nasal sinuses: Secondary | ICD-10-CM | POA: Diagnosis not present

## 2021-11-06 MED ORDER — MONTELUKAST SODIUM 10 MG PO TABS: 10.0000 mg | ORAL_TABLET | Freq: Every day | ORAL | 0 refills | Status: DC

## 2021-11-06 MED ORDER — PREDNISONE 50 MG PO TABS: 50.0000 mg | ORAL_TABLET | Freq: Every day | ORAL | 0 refills | Status: AC

## 2021-11-06 NOTE — Patient Instructions (Signed)
?Anna Mccarthy, thank you for joining Chaney Malling, PA for today's virtual visit.  While this provider is not your primary care provider (PCP), if your PCP is located in our provider database this encounter information will be shared with them immediately following your visit. ? ?Consent: ?(Patient) Anna Mccarthy provided verbal consent for this virtual visit at the beginning of the encounter. ? ?Current Medications: ? ?Current Outpatient Medications:  ?  montelukast (SINGULAIR) 10 MG tablet, Take 1 tablet (10 mg total) by mouth at bedtime., Disp: 30 tablet, Rfl: 0 ?  predniSONE (DELTASONE) 50 MG tablet, Take 1 tablet (50 mg total) by mouth daily with breakfast for 5 days., Disp: 5 tablet, Rfl: 0 ?  albuterol (VENTOLIN HFA) 108 (90 Base) MCG/ACT inhaler, Inhale 1-2 puffs into the lungs every 6 (six) hours as needed for wheezing or shortness of breath., Disp: 18 g, Rfl: 0 ?  ALPRAZolam (XANAX) 0.25 MG tablet, Take 0.25 mg by mouth 2 (two) times daily as needed for anxiety., Disp: , Rfl: 0 ?  aspirin EC 81 MG tablet, Take 1 tablet (81 mg total) by mouth 2 (two) times daily. To be taken after surgery for dvt porphylaxis, Disp: 84 tablet, Rfl: 0 ?  COVID-19 mRNA bivalent vaccine, Pfizer, (PFIZER COVID-19 VAC BIVALENT) injection, Inject into the muscle., Disp: 0.3 mL, Rfl: 0 ?  COVID-19 mRNA Vac-TriS, Pfizer, (PFIZER-BIONT COVID-19 VAC-TRIS) SUSP injection, Inject into the muscle., Disp: 0.3 mL, Rfl: 0 ?  DULoxetine (CYMBALTA) 20 MG capsule, Take 20 mg by mouth 2 (two) times daily., Disp: , Rfl:  ?  escitalopram (LEXAPRO) 20 MG tablet, Take 20 mg by mouth at bedtime. , Disp: , Rfl:  ?  fluocinonide (LIDEX) 0.05 % external solution, Apply 1 application topically 2 (two) times daily as needed (acne)., Disp: , Rfl:  ?  gabapentin (NEURONTIN) 100 MG capsule, Take 2 capsules (200 mg total) by mouth in the morning, at noon, and at bedtime., Disp: 540 capsule, Rfl: 0 ?  hydrochlorothiazide (HYDRODIURIL) 25 MG tablet, Take  25 mg by mouth daily., Disp: , Rfl:  ?  HYDROcodone-acetaminophen (NORCO) 7.5-325 MG tablet, Take 1-2 tablets by mouth 2 (two) times daily as needed for moderate pain. To be taken after surgery, Disp: 30 tablet, Rfl: 0 ?  hydroxychloroquine (PLAQUENIL) 200 MG tablet, Take 200 mg by mouth 2 (two) times daily., Disp: , Rfl:  ?  omeprazole (PRILOSEC) 40 MG capsule, Take 40 mg by mouth daily., Disp: , Rfl:  ?  tiZANidine (ZANAFLEX) 2 MG tablet, Take 1 tablet (2 mg total) by mouth every 8 (eight) hours as needed for muscle spasms. To be taken after surgery, Disp: 30 tablet, Rfl: 0 ?  Vitamin D, Ergocalciferol, (DRISDOL) 1.25 MG (50000 UNIT) CAPS capsule, Take 50,000 Units by mouth every Sunday., Disp: , Rfl:   ? ?Medications ordered in this encounter:  ?Meds ordered this encounter  ?Medications  ? predniSONE (DELTASONE) 50 MG tablet  ?  Sig: Take 1 tablet (50 mg total) by mouth daily with breakfast for 5 days.  ?  Dispense:  5 tablet  ?  Refill:  0  ?  Order Specific Question:   Supervising Provider  ?  Answer:   Noemi Chapel [[id number]]  ? montelukast (SINGULAIR) 10 MG tablet  ?  Sig: Take 1 tablet (10 mg total) by mouth at bedtime.  ?  Dispense:  30 tablet  ?  Refill:  0  ?  Order Specific Question:   Supervising Provider  ?  Answer:   Noemi Chapel [[id number]]  ?  ? ?*If you need refills on other medications prior to your next appointment, please contact your pharmacy* ? ?Follow-Up: ?Call back or seek an in-person evaluation if the symptoms worsen or if the condition fails to improve as anticipated. ? ?Other Instructions ?You are recovering from a sinus infection and bronchitis. ?Please start taking the prednisone once daily in the morning. It can cause insomnia if taken too close to bedtime. ?Start taking montelukast nightly to help clear up the congestion. ?It is also recommended that you use nasal saline/ sinus washes to cleans the sinus passages. ?Hot steam from a shower or vaporizer may also be beneficial to help open up  the upper airway. Eucalyptus can be helpful. ?If any worsening symptoms such as headache, fever, or shortness of breath, please go to an in person evaluation/ urgent care.  ? ? ?If you have been instructed to have an in-person evaluation today at a local Urgent Care facility, please use the link below. It will take you to a list of all of our available [facility], including address, phone number and hours of operation. Please do not delay care.  ?[facility] ? ?If you or a family member do not have a primary care provider, use the link below to schedule a visit and establish care. When you choose a [facility] primary care physician or advanced practice provider, you gain a long-term partner in health. ?Find a Primary Care Provider ? ?Learn more about [facility] in-office and virtual care options: ?[facility]  ?

## 2021-11-06 NOTE — Progress Notes (Signed)
? ? ?Virtual Visit Consent  ? ?Anna Mccarthy, you are scheduled for a virtual visit with a [facility] provider today. Just as with appointments in the office, your consent must be obtained to participate. Your consent will be active for this visit and any virtual visit you may have with one of our providers in the next 365 days. If you have a MyChart account, a copy of this consent can be sent to you electronically. ? ?As this is a virtual visit, video technology does not allow for your provider to perform a traditional examination. This may limit your provider's ability to fully assess your condition. If your provider identifies any concerns that need to be evaluated in person or the need to arrange testing (such as labs, EKG, etc.), we will make arrangements to do so. Although advances in technology are sophisticated, we cannot ensure that it will always work on either your end or our end. If the connection with a video visit is poor, the visit may have to be switched to a telephone visit. With either a video or telephone visit, we are not always able to ensure that we have a secure connection. ? ?By engaging in this virtual visit, you consent to the provision of healthcare and authorize for your insurance to be billed (if applicable) for the services provided during this visit. Depending on your insurance coverage, you may receive a charge related to this service. ? ?I need to obtain your verbal consent now. Are you willing to proceed with your visit today? Anna Mccarthy has provided verbal consent on 11/06/2021 for a virtual visit (video or telephone). Patterson, PA ? ?Date: 11/06/2021 2:46 PM ? ?Virtual Visit via Video Note  ? ?I, Anna Mccarthy, connected with  Anna Mccarthy  ([id number], 01-05-1956) on 11/06/21 at 10:45 AM EDT by a video-enabled telemedicine application and verified that I am speaking with the correct person using two identifiers. ? ?Location: ?Patient: Virtual Visit Location Patient:  Home ?Provider: Virtual Visit Location Provider: Home Office ?  ?I discussed the limitations of evaluation and management by telemedicine and the availability of in person appointments. The patient expressed understanding and agreed to proceed.   ? ?History of Present Illness: ?Anna Mccarthy is a 66 y.o. who identifies as a female who was assigned female at birth, and is being seen today for bronchitis. ? ?HPI: Pleasant 66yo female presents with concerns regarding her acute bronchitis. She was seen last week on 10/31/21 and prescribed prednisone '5mg'$  daily x 5 days and azithromycin. She was previously taking zyrtec,cough syrup and flonase, but quit as this was causing her to get a headache in sinus areas. She reports mild improvement but is still very "stuffy in the head." States she woke up early this morning due to head congestion. Admits to feeling like concrete in her sinuses when leaning forward. Nasal congestion and mucous is clear. Coughing is primarily dry, with occasional production of clear mucous. Denies sensitivity to teeth, hx of smoking but no current smoking. ? ?Problems:  ?Patient Active Problem List  ? Diagnosis Date Noted  ? Bilateral carpal tunnel syndrome 10/07/2021  ? Primary osteoarthritis of left knee 11/02/2020  ? Status post left knee replacement 11/02/2020  ? Status post total right knee replacement 08/10/2020  ? Primary osteoarthritis of right knee 01/30/2020  ? Chronic pain of left knee 09/11/2018  ? Meniscus degeneration, left 09/11/2018  ? Fibromyalgia 09/11/2018  ? Chronic pain syndrome 09/11/2018  ?  Venous ulcer ([place]) 02/02/2017  ? Varicose veins of bilateral lower extremities with pain 05/16/2016  ? Chronic venous insufficiency 05/16/2016  ? Pain in limb 05/16/2016  ? Swelling of limb 05/16/2016  ? DJD (degenerative joint disease) 05/16/2016  ?  ?Allergies:  ?Allergies  ?Allergen Reactions  ? Percocet [Oxycodone-Acetaminophen] Itching  ? Sulfa Antibiotics   ?  Gi problems ?   ? ?Medications:  ?Current Outpatient Medications:  ?  montelukast (SINGULAIR) 10 MG tablet, Take 1 tablet (10 mg total) by mouth at bedtime., Disp: 30 tablet, Rfl: 0 ?  predniSONE (DELTASONE) 50 MG tablet, Take 1 tablet (50 mg total) by mouth daily with breakfast for 5 days., Disp: 5 tablet, Rfl: 0 ?  albuterol (VENTOLIN HFA) 108 (90 Base) MCG/ACT inhaler, Inhale 1-2 puffs into the lungs every 6 (six) hours as needed for wheezing or shortness of breath., Disp: 18 g, Rfl: 0 ?  ALPRAZolam (XANAX) 0.25 MG tablet, Take 0.25 mg by mouth 2 (two) times daily as needed for anxiety., Disp: , Rfl: 0 ?  aspirin EC 81 MG tablet, Take 1 tablet (81 mg total) by mouth 2 (two) times daily. To be taken after surgery for dvt porphylaxis, Disp: 84 tablet, Rfl: 0 ?  COVID-19 mRNA bivalent vaccine, Pfizer, (PFIZER COVID-19 VAC BIVALENT) injection, Inject into the muscle., Disp: 0.3 mL, Rfl: 0 ?  COVID-19 mRNA Vac-TriS, Pfizer, (PFIZER-BIONT COVID-19 VAC-TRIS) SUSP injection, Inject into the muscle., Disp: 0.3 mL, Rfl: 0 ?  DULoxetine (CYMBALTA) 20 MG capsule, Take 20 mg by mouth 2 (two) times daily., Disp: , Rfl:  ?  escitalopram (LEXAPRO) 20 MG tablet, Take 20 mg by mouth at bedtime. , Disp: , Rfl:  ?  fluocinonide (LIDEX) 0.05 % external solution, Apply 1 application topically 2 (two) times daily as needed (acne)., Disp: , Rfl:  ?  gabapentin (NEURONTIN) 100 MG capsule, Take 2 capsules (200 mg total) by mouth in the morning, at noon, and at bedtime., Disp: 540 capsule, Rfl: 0 ?  hydrochlorothiazide (HYDRODIURIL) 25 MG tablet, Take 25 mg by mouth daily., Disp: , Rfl:  ?  HYDROcodone-acetaminophen (NORCO) 7.5-325 MG tablet, Take 1-2 tablets by mouth 2 (two) times daily as needed for moderate pain. To be taken after surgery, Disp: 30 tablet, Rfl: 0 ?  hydroxychloroquine (PLAQUENIL) 200 MG tablet, Take 200 mg by mouth 2 (two) times daily., Disp: , Rfl:  ?  omeprazole (PRILOSEC) 40 MG capsule, Take 40 mg by mouth daily., Disp: , Rfl:  ?   tiZANidine (ZANAFLEX) 2 MG tablet, Take 1 tablet (2 mg total) by mouth every 8 (eight) hours as needed for muscle spasms. To be taken after surgery, Disp: 30 tablet, Rfl: 0 ?  Vitamin D, Ergocalciferol, (DRISDOL) 1.25 MG (50000 UNIT) CAPS capsule, Take 50,000 Units by mouth every Sunday., Disp: , Rfl:  ? ?Observations/Objective: ?Patient is well-developed, well-nourished in no acute distress.  ?Resting comfortably on couch at home.  ?Congested, nasal quality voice. ?Head is normocephalic, atraumatic.  ?No labored breathing. Dry cough intermittently upon exam ?Speech is clear and coherent with logical content.  ?Patient is alert and oriented at baseline.  ?Minimal clear rhinorrhea ?No conjunctival injection ?No visible rash ? ?Assessment and Plan: ?1. Nasal congestion with rhinorrhea ? ?2. Acute bronchitis, unspecified organism ? ?Pt had resolution of the yellow phlegm on the antibiotics prescribed. She has had a hx of bronchitis in the past. She states steroids have always helped, but usually requires a stronger dose than '5mg'$ . She is specifically  requesting a stronger mg called in. In addition to her cough, pt having nasal congestion with rhinorrhea. Montelukast was added to help clear up the URI sx. Continue plain mucinex, add humidification and saline sprays. ? ?Follow Up Instructions: ?I discussed the assessment and treatment plan with the patient. The patient was provided an opportunity to ask questions and all were answered. The patient agreed with the plan and demonstrated an understanding of the instructions.  A copy of instructions were sent to the patient via MyChart unless otherwise noted below.  ? ? ?The patient was advised to call back or seek an in-person evaluation if the symptoms worsen or if the condition fails to improve as anticipated. ? ?Time:  ?I spent 10 minutes with the patient via telehealth technology discussing the above problems/concerns.   ? ?Shannie Kontos L Mariaguadalupe Fialkowski, PA  ?

## 2021-11-15 ENCOUNTER — Other Ambulatory Visit: Payer: Self-pay

## 2021-11-15 ENCOUNTER — Encounter (HOSPITAL_BASED_OUTPATIENT_CLINIC_OR_DEPARTMENT_OTHER): Payer: Self-pay | Admitting: Orthopedic Surgery

## 2021-11-16 ENCOUNTER — Encounter: Payer: Self-pay | Admitting: Orthopaedic Surgery

## 2021-11-16 ENCOUNTER — Ambulatory Visit: Payer: Medicare Other

## 2021-11-16 ENCOUNTER — Ambulatory Visit (INDEPENDENT_AMBULATORY_CARE_PROVIDER_SITE_OTHER): Payer: Medicare Other | Admitting: Orthopaedic Surgery

## 2021-11-16 ENCOUNTER — Encounter (HOSPITAL_BASED_OUTPATIENT_CLINIC_OR_DEPARTMENT_OTHER)
Admission: RE | Admit: 2021-11-16 | Discharge: 2021-11-16 | Disposition: A | Discharge status: Home / Self Care | Payer: Medicare Other | Source: Ambulatory Visit | Attending: Orthopedic Surgery | Admitting: Orthopedic Surgery

## 2021-11-16 ENCOUNTER — Ambulatory Visit (INDEPENDENT_AMBULATORY_CARE_PROVIDER_SITE_OTHER): Payer: Medicare Other

## 2021-11-16 DIAGNOSIS — Z96652 Presence of left artificial knee joint: Secondary | ICD-10-CM | POA: Diagnosis not present

## 2021-11-16 DIAGNOSIS — I10 Essential (primary) hypertension: Secondary | ICD-10-CM | POA: Insufficient documentation

## 2021-11-16 DIAGNOSIS — Z96651 Presence of right artificial knee joint: Secondary | ICD-10-CM

## 2021-11-16 DIAGNOSIS — Z01812 Encounter for preprocedural laboratory examination: Secondary | ICD-10-CM | POA: Insufficient documentation

## 2021-11-16 LAB — BASIC METABOLIC PANEL
Anion gap: 9 (ref 5–15)
BUN: 9 mg/dL (ref 8–23)
CO2: 27 mmol/L (ref 22–32)
Calcium: 9.2 mg/dL (ref 8.9–10.3)
Chloride: 98 mmol/L (ref 98–111)
Creatinine, Ser: 0.87 mg/dL (ref 0.44–1.00)
GFR, Estimated: >60 mL/min (ref >60)
Glucose, Bld: 96 mg/dL (ref 70–99)
Potassium: 4.6 mmol/L (ref 3.5–5.1)
Sodium: 134 mmol/L — ABNORMAL LOW (ref 135–145)

## 2021-11-16 NOTE — Progress Notes (Signed)
Post-Op Visit Note   Patient: Anna Mccarthy           Date of Birth: 12-16-1955           MRN: [id number] Visit Date: 11/16/2021 PCP: Maryland Pink, MD   Assessment & Plan:  Chief Complaint:  Chief Complaint  Patient presents with   Left Knee - Follow-up    Left TKA 10/2020   Right Knee - Follow-up    Right TKA 07/2020   Visit Diagnoses:  1. Status post left knee replacement   2. Status post total right knee replacement     Plan: Patient is a pleasant 65 year old female who comes in today for follow-up of bilateral total knee replacements, right total knee replacement February 2022 and left total knee replacement May 2022.  She has been doing excellent in regards to both knees.  No complaints.  At this point, she will continue to advance with activity as tolerated.  Dental prophylaxis reinforced for another year.  Follow-up in 1 years time for repeat evaluation and x-rays of both knees.  Call with concerns or questions.  Follow-Up Instructions: Return in about 1 year (around 11/17/2022).   Orders:  Orders Placed This Encounter  Procedures   XR Knee 1-2 Views Right   XR Knee 1-2 Views Left   No orders of the defined types were placed in this encounter.   Imaging: XR Knee 1-2 Views Left  Result Date: 11/16/2021 Well-seated prosthesis without complication  XR Knee 1-2 Views Right  Result Date: 11/16/2021 Well-seated prosthesis without complication   PMFS History: Patient Active Problem List   Diagnosis Date Noted   Bilateral carpal tunnel syndrome 10/07/2021   Primary osteoarthritis of left knee 11/02/2020   Status post left knee replacement 11/02/2020   Status post total right knee replacement 08/10/2020   Primary osteoarthritis of right knee 01/30/2020   Chronic pain of left knee 09/11/2018   Meniscus degeneration, left 09/11/2018   Fibromyalgia 09/11/2018   Chronic pain syndrome 09/11/2018   Venous ulcer ([place]) 02/02/2017   Varicose veins of bilateral lower  extremities with pain 05/16/2016   Chronic venous insufficiency 05/16/2016   Pain in limb 05/16/2016   Swelling of limb 05/16/2016   DJD (degenerative joint disease) 05/16/2016   Past Medical History:  Diagnosis Date   Allergy    Anxiety    Arthritis    Bronchitis    Cancer ([place])    skin   Fibromyalgia    GERD (gastroesophageal reflux disease)    Hypertension    Osteoarthritis    Pneumonia     Family History  Problem Relation Age of Onset   Breast cancer Maternal Aunt    Ovarian cancer Sister    Breast cancer Cousin     Past Surgical History:  Procedure Laterality Date   ABDOMINAL HYSTERECTOMY  1996   BREAST BIOPSY Left 2011   stereo done by dr. Bary Castilla -benign   COLONOSCOPY     COLONOSCOPY WITH PROPOFOL N/A 05/30/2016   Procedure: COLONOSCOPY WITH PROPOFOL;  Surgeon: Lollie Sails, MD;  Location: [facility] ENDOSCOPY;  Service: Endoscopy;  Laterality: N/A;   DILATION AND CURETTAGE OF UTERUS     x3   HERNIA REPAIR  2012   JOINT REPLACEMENT     KNEE ARTHROSCOPY WITH MEDIAL MENISECTOMY Left 02/18/2016   Procedure: KNEE ARTHROSCOPY WITH PARTIAL MEDIAL MENISECTOMY;  Surgeon: Hessie Knows, MD;  Location: [facility] ORS;  Service: Orthopedics;  Laterality: Left;   TOTAL KNEE ARTHROPLASTY Right  08/10/2020   Procedure: RIGHT TOTAL KNEE ARTHROPLASTY;  Surgeon: Leandrew Koyanagi, MD;  Location: [facility];  Service: Orthopedics;  Laterality: Right;   TOTAL KNEE ARTHROPLASTY Left 11/02/2020   Procedure: LEFT TOTAL KNEE ARTHROPLASTY;  Surgeon: Leandrew Koyanagi, MD;  Location: [facility];  Service: Orthopedics;  Laterality: Left;   TUBAL LIGATION  1984   VARICOSE VEIN SURGERY Bilateral    Social History   Occupational History   Not on file  Tobacco Use   Smoking status: Never   Smokeless tobacco: Never  Vaping Use   Vaping Use: Never used  Substance and Sexual Activity   Alcohol use: Yes    Comment: once per month   Drug use: No   Sexual activity: Not on file

## 2021-11-19 ENCOUNTER — Encounter: Payer: Medicare Other | Admitting: Orthopedic Surgery

## 2021-11-24 ENCOUNTER — Ambulatory Visit (HOSPITAL_BASED_OUTPATIENT_CLINIC_OR_DEPARTMENT_OTHER)
Admission: RE | Admit: 2021-11-24 | Discharge: 2021-11-24 | Disposition: A | Discharge status: Home / Self Care | Payer: Medicare Other | Source: Ambulatory Visit | Attending: Orthopedic Surgery | Admitting: Orthopedic Surgery

## 2021-11-24 ENCOUNTER — Ambulatory Visit (HOSPITAL_BASED_OUTPATIENT_CLINIC_OR_DEPARTMENT_OTHER): Payer: Medicare Other | Admitting: Anesthesiology

## 2021-11-24 ENCOUNTER — Encounter (HOSPITAL_BASED_OUTPATIENT_CLINIC_OR_DEPARTMENT_OTHER)
Admission: RE | Disposition: A | Discharge status: Home / Self Care | Payer: Self-pay | Source: Ambulatory Visit | Attending: Orthopedic Surgery

## 2021-11-24 ENCOUNTER — Other Ambulatory Visit: Payer: Self-pay

## 2021-11-24 ENCOUNTER — Encounter (HOSPITAL_BASED_OUTPATIENT_CLINIC_OR_DEPARTMENT_OTHER): Payer: Self-pay | Admitting: Orthopedic Surgery

## 2021-11-24 DIAGNOSIS — K219 Gastro-esophageal reflux disease without esophagitis: Secondary | ICD-10-CM | POA: Diagnosis not present

## 2021-11-24 DIAGNOSIS — I10 Essential (primary) hypertension: Secondary | ICD-10-CM | POA: Diagnosis not present

## 2021-11-24 DIAGNOSIS — F419 Anxiety disorder, unspecified: Secondary | ICD-10-CM | POA: Insufficient documentation

## 2021-11-24 DIAGNOSIS — M797 Fibromyalgia: Secondary | ICD-10-CM | POA: Diagnosis not present

## 2021-11-24 DIAGNOSIS — G5601 Carpal tunnel syndrome, right upper limb: Secondary | ICD-10-CM | POA: Diagnosis present

## 2021-11-24 DIAGNOSIS — Z79899 Other long term (current) drug therapy: Secondary | ICD-10-CM | POA: Diagnosis not present

## 2021-11-24 DIAGNOSIS — Z01818 Encounter for other preprocedural examination: Secondary | ICD-10-CM

## 2021-11-24 HISTORY — PX: CARPAL TUNNEL RELEASE: SHX101

## 2021-11-24 SURGERY — CARPAL TUNNEL RELEASE
Anesthesia: Monitor Anesthesia Care | Site: Hand | Laterality: Right

## 2021-11-24 MED ORDER — MIDAZOLAM HCL 5 MG/5ML IJ SOLN
INTRAMUSCULAR | Status: DC | PRN
  Administered 2021-11-24: 1 mg via INTRAVENOUS

## 2021-11-24 MED ORDER — MIDAZOLAM HCL 2 MG/2ML IJ SOLN
INTRAMUSCULAR | Status: AC
  Filled 2021-11-24: qty 2

## 2021-11-24 MED ORDER — FENTANYL CITRATE (PF) 100 MCG/2ML IJ SOLN
INTRAMUSCULAR | Status: DC | PRN
Start: 2021-11-24 — End: 2021-11-24
  Administered 2021-11-24: 50 ug via INTRAVENOUS

## 2021-11-24 MED ORDER — LIDOCAINE HCL (PF) 1 % IJ SOLN
INTRAMUSCULAR | Status: DC | PRN
  Administered 2021-11-24: 10 mL

## 2021-11-24 MED ORDER — KETOROLAC TROMETHAMINE 15 MG/ML IJ SOLN: 15.0000 mg | Freq: Once | INTRAMUSCULAR | Status: DC | PRN

## 2021-11-24 MED ORDER — ACETAMINOPHEN 500 MG PO TABS
ORAL_TABLET | ORAL | Status: AC
Start: 2021-11-24 — End: ?
  Filled 2021-11-24: qty 2

## 2021-11-24 MED ORDER — AMISULPRIDE (ANTIEMETIC) 5 MG/2ML IV SOLN: 10.0000 mg | Freq: Once | INTRAVENOUS | Status: DC | PRN

## 2021-11-24 MED ORDER — DEXMEDETOMIDINE (PRECEDEX) IN NS 20 MCG/5ML (4 MCG/ML) IV SYRINGE
PREFILLED_SYRINGE | INTRAVENOUS | Status: DC | PRN
  Administered 2021-11-24: 8 ug via INTRAVENOUS

## 2021-11-24 MED ORDER — ONDANSETRON HCL 4 MG/2ML IJ SOLN: 4.0000 mg | Freq: Once | INTRAMUSCULAR | Status: DC | PRN

## 2021-11-24 MED ORDER — LACTATED RINGERS IV SOLN: INTRAVENOUS | Status: DC

## 2021-11-24 MED ORDER — FENTANYL CITRATE (PF) 100 MCG/2ML IJ SOLN
INTRAMUSCULAR | Status: AC
  Filled 2021-11-24: qty 2

## 2021-11-24 MED ORDER — FENTANYL CITRATE (PF) 100 MCG/2ML IJ SOLN: 25.0000 ug | INTRAMUSCULAR | Status: DC | PRN

## 2021-11-24 MED ORDER — ONDANSETRON HCL 4 MG/2ML IJ SOLN
INTRAMUSCULAR | Status: DC | PRN
  Administered 2021-11-24: 4 mg via INTRAVENOUS

## 2021-11-24 MED ORDER — ACETAMINOPHEN 500 MG PO TABS
1000.0000 mg | ORAL_TABLET | Freq: Once | ORAL | Status: AC
  Administered 2021-11-24: 1000 mg via ORAL

## 2021-11-24 MED ORDER — PROPOFOL 500 MG/50ML IV EMUL
INTRAVENOUS | Status: DC | PRN
  Administered 2021-11-24: 50 ug/kg/min via INTRAVENOUS

## 2021-11-24 MED ORDER — 0.9 % SODIUM CHLORIDE (POUR BTL) OPTIME
TOPICAL | Status: DC | PRN
  Administered 2021-11-24: 75 mL

## 2021-11-24 SURGICAL SUPPLY — 44 items
APL PRP STRL LF DISP 70% ISPRP (MISCELLANEOUS) ×1
BLADE SURG 15 STRL LF DISP TIS (BLADE) ×1 IMPLANT
BLADE SURG 15 STRL SS (BLADE) ×4
BNDG CMPR 9X4 STRL LF SNTH (GAUZE/BANDAGES/DRESSINGS) ×1
BNDG ELASTIC 3X5.8 VLCR STR LF (GAUZE/BANDAGES/DRESSINGS) ×2 IMPLANT
BNDG ESMARK 4X9 LF (GAUZE/BANDAGES/DRESSINGS) ×2 IMPLANT
BNDG GAUZE ELAST 4 BULKY (GAUZE/BANDAGES/DRESSINGS) ×2 IMPLANT
BNDG PLASTER X FAST 3X3 WHT LF (CAST SUPPLIES) IMPLANT
BNDG PLSTR 9X3 FST ST WHT (CAST SUPPLIES)
CHLORAPREP W/TINT 26 (MISCELLANEOUS) ×2 IMPLANT
CORD BIPOLAR FORCEPS 12FT (ELECTRODE) ×2 IMPLANT
COVER BACK TABLE 60X90IN (DRAPES) ×2 IMPLANT
COVER MAYO STAND STRL (DRAPES) ×2 IMPLANT
CUFF TOURN SGL QUICK 18X4 (TOURNIQUET CUFF) IMPLANT
CUFF TOURN SGL QUICK 24 (TOURNIQUET CUFF)
CUFF TRNQT CYL 24X4X16.5-23 (TOURNIQUET CUFF) IMPLANT
DRAPE EENT ADH APERT 31X51 STR (DRAPES) ×1 IMPLANT
DRAPE EXTREMITY T 121X128X90 (DISPOSABLE) ×2 IMPLANT
DRAPE SURG 17X23 STRL (DRAPES) ×1 IMPLANT
GAUZE XEROFORM 1X8 LF (GAUZE/BANDAGES/DRESSINGS) ×1 IMPLANT
GLOVE BIO SURGEON STRL SZ 6.5 (GLOVE) ×1 IMPLANT
GLOVE BIO SURGEON STRL SZ7 (GLOVE) ×3 IMPLANT
GLOVE BIOGEL PI IND STRL 7.0 (GLOVE) ×1 IMPLANT
GLOVE BIOGEL PI INDICATOR 7.0 (GLOVE) ×4
GOWN STRL REUS W/ TWL LRG LVL3 (GOWN DISPOSABLE) ×1 IMPLANT
GOWN STRL REUS W/TWL LRG LVL3 (GOWN DISPOSABLE) ×4
GOWN STRL REUS W/TWL XL LVL3 (GOWN DISPOSABLE) ×2 IMPLANT
NDL HYPO 25X1 1.5 SAFETY (NEEDLE) IMPLANT
NEEDLE HYPO 25X1 1.5 SAFETY (NEEDLE) ×2 IMPLANT
NS IRRIG 1000ML POUR BTL (IV SOLUTION) ×2 IMPLANT
PACK BASIN DAY SURGERY FS (CUSTOM PROCEDURE TRAY) ×2 IMPLANT
PAD CAST 3X4 CTTN HI CHSV (CAST SUPPLIES) ×1 IMPLANT
PADDING CAST COTTON 3X4 STRL (CAST SUPPLIES)
SLEEVE SCD COMPRESS KNEE MED (STOCKING) IMPLANT
SUCTION FRAZIER HANDLE 10FR (MISCELLANEOUS)
SUCTION TUBE FRAZIER 10FR DISP (MISCELLANEOUS) IMPLANT
SUT ETHILON 4 0 PS 2 18 (SUTURE) ×2 IMPLANT
SUT MNCRL AB 3-0 PS2 18 (SUTURE) IMPLANT
SUT VICRYL 4-0 PS2 18IN ABS (SUTURE) IMPLANT
SYR BULB EAR ULCER 3OZ GRN STR (SYRINGE) ×2 IMPLANT
SYR CONTROL 10ML LL (SYRINGE) ×1 IMPLANT
TOWEL GREEN STERILE FF (TOWEL DISPOSABLE) ×3 IMPLANT
TUBE CONNECTING 20X1/4 (TUBING) IMPLANT
UNDERPAD 30X36 HEAVY ABSORB (UNDERPADS AND DIAPERS) ×2 IMPLANT

## 2021-11-24 NOTE — Op Note (Signed)
   Date of Surgery: 11/24/2021  INDICATIONS: Patient is a 67 y.o.-year-old female with right carpal tunnel syndrome that was confirmed by electrodiagnostic studies and has failed conservative management..  Risks, benefits, and alternatives to surgery were again discussed with the patient in the preoperative area. The patient wishes to proceed with surgery.  Informed consent was signed after our discussion.   PREOPERATIVE DIAGNOSIS: 1.  Right carpal tunnel syndrome  POSTOPERATIVE DIAGNOSIS: Same.  PROCEDURE: 1.  Right carpal tunnel release   SURGEON: Audria Nine, M.D.  ASSIST:   ANESTHESIA: Local, MAC  IV FLUIDS AND URINE: See anesthesia.  ESTIMATED BLOOD LOSS: Than 5 mL.  IMPLANTS: * No implants in log *   DRAINS: None  COMPLICATIONS: None  DESCRIPTION OF PROCEDURE: The patient was met in the preoperative holding area where the surgical site was marked and the consent form was verified.  The patient was then taken to the operating room and transferred to the operating table.  All bony prominences were well padded.  A tourniquet was applied to the right forearm.  Monitored sedation was induced.   A formal time-out was performed to confirm that this was the correct patient, surgery, side, and site.  A local block was performed using 10 cc of 1% lidocaine.  The operative extremity was prepped and draped in the usual and sterile fashion.    Following a second timeout, the limb was exsanguinated and the tourniquet inflated to 250 mmHg.  A longitudinal incision was made in line with the radial border of the ring finger from distal to the wrist flexion crease to the intersection of Kaplan's cardinal line.  The skin and subcutaneous tissue was sharply divided.  The longitudinally running palmar fascia was incised.  The thenar musculature was bluntly swept off of the transverse carpal ligament.  The ligament was divided from proximal to distal until the fat surrounding the palmar arch was  encountered. Retractors were then placed in the proximal aspect of the wound to visualize the distal antebrachial fascia.  The fascia was sharply divided under direct visualization.   The wound was then thoroughly irrigated with sterile saline.  The tourniquet was deflated.  Hemostasis was achieved with direct pressure and bipolar electrocautery.  The wound was then closed with 4-0 nylon sutures in a horizontal mattress fashion. The wound was then dressed with xeroform, folded kerlix, and an ace wrap.  The patient was then reversed from anesthesia and transferred to the postoperative bed.  All counts were correct x 2 at the end of the procedure.  The patient was taken to the recovery unit in stable condition.      POSTOPERATIVE PLAN: She will be discharged home with appropriate pain medication and discharge instructions.  I will see her back in the office in 10 to 14 days for her first postop visit.  Audria Nine, MD 11:57 AM

## 2021-11-24 NOTE — H&P (Signed)
HAND SURGERY   HPI: Anna Mccarthy is a 66 y.o. female who presents with numbness and paresthesias in both hands.  Recent EMG/nerve conduction study suggested bilateral severe carpal tunnel syndrome.  This been going on for years now.  She describes both nocturnal and daytime symptoms.  She describes numbness in the thumb, index, middle, and ring fingers.  She has failed conservative management.  Hand dominance: RHD   Past Medical History:  Diagnosis Date   Allergy    Anxiety    Arthritis    Bronchitis    Cancer ([place])    skin   Fibromyalgia    GERD (gastroesophageal reflux disease)    Hypertension    Osteoarthritis    Pneumonia    Past Surgical History:  Procedure Laterality Date   ABDOMINAL HYSTERECTOMY  1996   BREAST BIOPSY Left 2011   stereo done by dr. Bary Castilla -benign   COLONOSCOPY     COLONOSCOPY WITH PROPOFOL N/A 05/30/2016   Procedure: COLONOSCOPY WITH PROPOFOL;  Surgeon: Lollie Sails, MD;  Location: [facility] ENDOSCOPY;  Service: Endoscopy;  Laterality: N/A;   DILATION AND CURETTAGE OF UTERUS     x3   HERNIA REPAIR  2012   JOINT REPLACEMENT     KNEE ARTHROSCOPY WITH MEDIAL MENISECTOMY Left 02/18/2016   Procedure: KNEE ARTHROSCOPY WITH PARTIAL MEDIAL MENISECTOMY;  Surgeon: Hessie Knows, MD;  Location: [facility] ORS;  Service: Orthopedics;  Laterality: Left;   TOTAL KNEE ARTHROPLASTY Right 08/10/2020   Procedure: RIGHT TOTAL KNEE ARTHROPLASTY;  Surgeon: Leandrew Koyanagi, MD;  Location: [place];  Service: Orthopedics;  Laterality: Right;   TOTAL KNEE ARTHROPLASTY Left 11/02/2020   Procedure: LEFT TOTAL KNEE ARTHROPLASTY;  Surgeon: Leandrew Koyanagi, MD;  Location: [place];  Service: Orthopedics;  Laterality: Left;   TUBAL LIGATION  1984   VARICOSE VEIN SURGERY Bilateral    Social History   Socioeconomic History   Marital status: Married    Spouse name: Not on file   Number of children: Not on file   Years of education: Not on file   Highest education level: Not on file   Occupational History   Not on file  Tobacco Use   Smoking status: Never   Smokeless tobacco: Never  Vaping Use   Vaping Use: Never used  Substance and Sexual Activity   Alcohol use: Yes    Comment: once per month   Drug use: No   Sexual activity: Not on file  Other Topics Concern   Not on file  Social History Narrative   Not on file   Social Determinants of Health   Financial Resource Strain: Not on file  Food Insecurity: Not on file  Transportation Needs: Not on file  Physical Activity: Not on file  Stress: Not on file  Social Connections: Not on file   Family History  Problem Relation Age of Onset   Breast cancer Maternal Aunt    Ovarian cancer Sister    Breast cancer Cousin    - negative except otherwise stated in the family history section Allergies  Allergen Reactions   Percocet [Oxycodone-Acetaminophen] Itching   Sulfa Antibiotics     Gi problems    Prior to Admission medications   Medication Sig Start Date End Date Taking? Authorizing Provider  albuterol (VENTOLIN HFA) 108 (90 Base) MCG/ACT inhaler Inhale 1-2 puffs into the lungs every 6 (six) hours as needed for wheezing or shortness of breath. 06/04/21  Yes Wurst, [place], PA-C  ALPRAZolam Duanne Moron)  0.25 MG tablet Take 0.25 mg by mouth 2 (two) times daily as needed for anxiety. 11/17/16  Yes [provider]  DULoxetine (CYMBALTA) 20 MG capsule Take 20 mg by mouth 2 (two) times daily. 03/22/17  Yes [provider]  escitalopram (LEXAPRO) 20 MG tablet Take 20 mg by mouth at bedtime.    Yes [provider]  gabapentin (NEURONTIN) 100 MG capsule Take 2 capsules (200 mg total) by mouth in the morning, at noon, and at bedtime. 04/20/20 11/24/21 Yes Gillis Santa, MD  hydrochlorothiazide (HYDRODIURIL) 25 MG tablet Take 25 mg by mouth daily.   Yes [provider]  hydroxychloroquine (PLAQUENIL) 200 MG tablet Take 200 mg by mouth 2 (two) times daily. 06/25/18  Yes [provider]  omeprazole (PRILOSEC) 40 MG capsule Take 40 mg by mouth daily. 08/10/15  Yes [provider]  Vitamin D, Ergocalciferol, (DRISDOL) 1.25 MG (50000 UNIT) CAPS capsule Take 50,000 Units by mouth every Sunday. 05/11/20  Yes [provider]  COVID-19 mRNA bivalent vaccine, Pfizer, (PFIZER COVID-19 VAC BIVALENT) injection Inject into the muscle. 03/16/21   Carlyle Basques, MD  COVID-19 mRNA Vac-TriS, Pfizer, (PFIZER-BIONT COVID-19 VAC-TRIS) SUSP injection Inject into the muscle. 12/04/20   Carlyle Basques, MD  fluocinonide (LIDEX) 0.05 % external solution Apply 1 application topically 2 (two) times daily as needed (acne). 09/06/18   [provider]  montelukast (SINGULAIR) 10 MG tablet Take 1 tablet (10 mg total) by mouth at bedtime. 11/06/21   Crain, Whitney L, PA   No results found. - pertinent xrays, CT, MRI studies were reviewed and independently interpreted  Positive ROS: All other systems have been reviewed and were otherwise negative with the exception of those mentioned in the HPI and as above.  Physical Exam: General: No acute distress, resting comfortably Cardiovascular: No pedal edema Respiratory: No cyanosis, no use of accessory musculature Skin: No lesions in the area of chief complaint Neurologic: Sensation intact distally Psychiatric: Patient is at baseline mood and affect  Right hand Positive Tinel and Phalen signs 5 out of 5 thenar muscle strength. Skin warm and dry with no lesions at the surgical site    Assessment: 66 year old female with bilateral severe carpal tunnel syndrome that is confirmed electrodiagnostic testing and has failed conservative management.  Plan: OR today for right carpal tunnel release We again reviewed the risks of surgery which include, but are not limited to, bleeding, infection, damage to the median nerve or its branches, delayed wound healing, incomplete symptom relief, need for additional procedures. Informed consent  was signed  Thank you for the consult and the opportunity to see Anna Mccarthy, M.D. [facility] 9:48 AM

## 2021-11-24 NOTE — Anesthesia Preprocedure Evaluation (Addendum)
Anesthesia Evaluation  Patient identified by MRN, date of birth, ID band Patient awake    Reviewed: Allergy & Precautions, NPO status , Patient's Chart, lab work & pertinent test results  Airway Mallampati: II  TM Distance: >3 FB Neck ROM: Full    Dental no notable dental hx.    Pulmonary neg pulmonary ROS,    Pulmonary exam normal        Cardiovascular hypertension, Pt. on medications Normal cardiovascular exam     Neuro/Psych Anxiety  Neuromuscular disease    GI/Hepatic Neg liver ROS, GERD  Medicated and Controlled,  Endo/Other  negative endocrine ROS  Renal/GU negative Renal ROS     Musculoskeletal  (+) Arthritis , Fibromyalgia -  Abdominal (+) + obese,   Peds  Hematology negative hematology ROS (+)   Anesthesia Other Findings RIGHT CARPAL TUNNEL SYNDROME  Reproductive/Obstetrics                            Anesthesia Physical Anesthesia Plan  ASA: 2  Anesthesia Plan: MAC   Post-op Pain Management:    Induction: Intravenous  PONV Risk Score and Plan: 2 and Ondansetron, Dexamethasone, Midazolam, Treatment may vary due to age or medical condition and Propofol infusion  Airway Management Planned: Simple Face Mask  Additional Equipment:   Intra-op Plan:   Post-operative Plan:   Informed Consent: I have reviewed the patients History and Physical, chart, labs and discussed the procedure including the risks, benefits and alternatives for the proposed anesthesia with the patient or authorized representative who has indicated his/her understanding and acceptance.     Dental advisory given  Plan Discussed with: CRNA  Anesthesia Plan Comments:         Anesthesia Quick Evaluation

## 2021-11-24 NOTE — Transfer of Care (Signed)
Immediate Anesthesia Transfer of Care Note  Patient: [name]  Procedure(s) Performed: RIGHT CARPAL TUNNEL RELEASE (Right: Hand)  Patient Location: PACU  Anesthesia Type:MAC  Level of Consciousness: awake, alert , oriented and patient cooperative  Airway & Oxygen Therapy: Patient Spontanous Breathing and Patient connected to face mask oxygen  Post-op Assessment: Report given to RN and Post -op Vital signs reviewed and stable  Post vital signs: Reviewed and stable  Last Vitals:  Vitals Value Taken Time  BP    Temp    Pulse    Resp    SpO2      Last Pain:  Vitals:   11/24/21 0937  TempSrc: Oral  PainSc: 0-No pain         Complications: No notable events documented.

## 2021-11-24 NOTE — Anesthesia Postprocedure Evaluation (Signed)
Anesthesia Post Note  Patient: [name]  Procedure(s) Performed: RIGHT CARPAL TUNNEL RELEASE (Right: Hand)     Patient location during evaluation: PACU Anesthesia Type: MAC Level of consciousness: awake Pain management: pain level controlled Vital Signs Assessment: post-procedure vital signs reviewed and stable Respiratory status: spontaneous breathing, nonlabored ventilation, respiratory function stable and patient connected to nasal cannula oxygen Cardiovascular status: stable and blood pressure returned to baseline Postop Assessment: no apparent nausea or vomiting Anesthetic complications: no   No notable events documented.  Last Vitals:  Vitals:   11/24/21 1215 11/24/21 1236  BP:  125/67  Pulse: 71 71  Resp: 14 18  Temp:  36.4 C  SpO2: 97% 94%    Last Pain:  Vitals:   11/24/21 1236  TempSrc: Oral  PainSc: 0-No pain                 Azarian Starace P Shalandra Leu

## 2021-11-24 NOTE — Discharge Instructions (Addendum)
Anna Mccarthy, M.D. Hand Surgery  POST-OPERATIVE DISCHARGE INSTRUCTIONS   PRESCRIPTIONS: - You may have been given a prescription to be taken as directed for post-operative pain control.  You may also take over the counter ibuprofen/aleve and tylenol for pain. Take this as directed on the packaging. Do not exceed 3000 mg tylenol/acetaminophen in 24 hours.  Ibuprofen 600-800 mg (3-4) tablets by mouth every 6 hours as needed for pain.   OR  Aleve 2 tablets by mouth every 12 hours (twice daily) as needed for pain.   AND/OR  Tylenol 1000 mg (2 tablets) every 8 hours as needed for pain.  - Please use your pain medication carefully, as refills are limited and you may not be provided with one.  As stated above, please use over the counter pain medicine - it will also be helpful with decreasing your swelling.    ANESTHESIA: -After your surgery, post-surgical discomfort or pain is likely. This discomfort can last several days to a few weeks. At certain times of the day your discomfort may be more intense.   Did you receive a nerve block?   - A nerve block can provide pain relief for one hour to two days after your surgery. As long as the nerve block is working, you will experience little or no sensation in the area the surgeon operated on.  - As the nerve block wears off, you will begin to experience pain or discomfort. It is very important that you begin taking your prescribed pain medication before the nerve block fully wears off. Treating your pain at the first sign of the block wearing off will ensure your pain is better controlled and more tolerable when full-sensation returns. Do not wait until the pain is intolerable, as the medicine will be less effective. It is better to treat pain in advance than to try and catch up.   General Anesthesia:  If you did not receive a nerve block during your surgery, you will need to start taking your pain medication shortly after your surgery and  should continue to do so as prescribed by your surgeon.     ICE AND ELEVATION: - You may use ice for the first 48-72 hours, but it is not critical.   - Motion of your fingers is very important to decrease the swelling.  - Elevation, as much as possible for the next 48 hours, is critical for decreasing swelling as well as for pain relief. Elevation means when you are seated or lying down, you hand should be at or above your heart. When walking, the hand needs to be at or above the level of your elbow.  - If the bandage gets too tight, it may need to be loosened. Please contact our office and we will instruct you in how to do this.    SURGICAL BANDAGES:  - Keep your dressing and/or splint clean and dry at all times.  You can remove your dressing 4 days from now and change with a dry dressing or Band-Aids as needed thereafter. - You may place a plastic bag over your bandage to shower, but be careful, do not get your bandages wet.  - After the bandages have been removed, it is OK to get the stitches wet in a shower or with hand washing. Do Not soak or submerge the wound yet. Please do not use lotions or creams on the stitches.      HAND THERAPY:  - You may not need any. If you  do, we will begin this at your follow up visit in the clinic.    ACTIVITY AND WORK: - You are encouraged to move any fingers which are not in the bandage.  - Light use of the fingers is allowed to assist the other hand with daily hygiene and eating, but strong gripping or lifting is often uncomfortable and should be avoided.  - You might miss a variable period of time from work and hopefully this issue has been discussed prior to surgery. You may not do any heavy work with your affected hand for about 2 weeks.    [facility] [address] [place],  [place]  [address] [phone]    Post Anesthesia Home Care Instructions  Activity: Get plenty of rest for the remainder of the day. A  responsible individual must stay with you for 24 hours following the procedure.  For the next 24 hours, DO NOT: -Drive a car -Paediatric nurse -Drink alcoholic beverages -Take any medication unless instructed by your physician -Make any legal decisions or sign important papers.  Meals: Start with liquid foods such as gelatin or soup. Progress to regular foods as tolerated. Avoid greasy, spicy, heavy foods. If nausea and/or vomiting occur, drink only clear liquids until the nausea and/or vomiting subsides. Call your physician if vomiting continues.  Special Instructions/Symptoms: Your throat may feel dry or sore from the anesthesia or the breathing tube placed in your throat during surgery. If this causes discomfort, gargle with warm salt water. The discomfort should disappear within 24 hours.  If you had a scopolamine patch placed behind your ear for the management of post- operative nausea and/or vomiting:  1. The medication in the patch is effective for 72 hours, after which it should be removed.  Wrap patch in a tissue and discard in the trash. Wash hands thoroughly with soap and water. 2. You may remove the patch earlier than 72 hours if you experience unpleasant side effects which may include dry mouth, dizziness or visual disturbances. 3. Avoid touching the patch. Wash your hands with soap and water after contact with the patch.  *May have Tylenol at 3:45pm today 11/24/2021

## 2021-11-24 NOTE — Anesthesia Procedure Notes (Signed)
Procedure Name: MAC Date/Time: 11/24/2021 11:36 AM Performed by: Signe Colt, CRNA Pre-anesthesia Checklist: Patient identified, Emergency Drugs available, Suction available, Patient being monitored and Timeout performed Patient Re-evaluated:Patient Re-evaluated prior to induction Oxygen Delivery Method: Simple face mask

## 2021-11-24 NOTE — Interval H&P Note (Signed)
History and Physical Interval Note:  11/24/2021 9:51 AM  Anna Mccarthy  has presented today for surgery, with the diagnosis of RIGHT CARPAL TUNNEL SYNDROME.  The various methods of treatment have been discussed with the patient and family. After consideration of risks, benefits and other options for treatment, the patient has consented to  Procedure(s): RIGHT CARPAL TUNNEL RELEASE (Right) as a surgical intervention.  The patient's history has been reviewed, patient examined, no change in status, stable for surgery.  I have reviewed the patient's chart and labs.  Questions were answered to the patient's satisfaction.     Alberta Cairns Arelene Moroni

## 2021-11-25 ENCOUNTER — Encounter (HOSPITAL_BASED_OUTPATIENT_CLINIC_OR_DEPARTMENT_OTHER): Payer: Self-pay | Admitting: Orthopedic Surgery

## 2021-12-07 ENCOUNTER — Ambulatory Visit (INDEPENDENT_AMBULATORY_CARE_PROVIDER_SITE_OTHER): Payer: Medicare Other | Admitting: Orthopedic Surgery

## 2021-12-07 DIAGNOSIS — G5601 Carpal tunnel syndrome, right upper limb: Secondary | ICD-10-CM

## 2021-12-07 NOTE — Progress Notes (Signed)
Post-Op Visit Note   Patient: Anna Mccarthy           Date of Birth: 05/23/1956           MRN: [id number] Visit Date: 12/07/2021 PCP: Maryland Pink, MD   Assessment & Plan:  Chief Complaint:  Chief Complaint  Patient presents with   Right Hand - Routine Post Op    Doing good, incision looks good.   Visit Diagnoses:  1. Carpal tunnel syndrome, right upper limb     Plan: Patient is two weeks s/p R CTR.  She is doing very well.  The incision is clean, dry, and well healed.  Sutures removed.  She has 5/5 thenar motor strength.  Her numbness and nocturnal symptoms have resolved.  She would like to have her left carpal tunnel surgery in September and will call the office when she's ready to schedule.   Follow-Up Instructions: No follow-ups on file.   Orders:  No orders of the defined types were placed in this encounter.  No orders of the defined types were placed in this encounter.   Imaging: No results found.  PMFS History: Patient Active Problem List   Diagnosis Date Noted   Carpal tunnel syndrome, right upper limb    Bilateral carpal tunnel syndrome 10/07/2021   Primary osteoarthritis of left knee 11/02/2020   Status post left knee replacement 11/02/2020   Status post total right knee replacement 08/10/2020   Primary osteoarthritis of right knee 01/30/2020   Chronic pain of left knee 09/11/2018   Meniscus degeneration, left 09/11/2018   Fibromyalgia 09/11/2018   Chronic pain syndrome 09/11/2018   Venous ulcer ([place]) 02/02/2017   Varicose veins of bilateral lower extremities with pain 05/16/2016   Chronic venous insufficiency 05/16/2016   Pain in limb 05/16/2016   Swelling of limb 05/16/2016   DJD (degenerative joint disease) 05/16/2016   Past Medical History:  Diagnosis Date   Allergy    Anxiety    Arthritis    Bronchitis    Cancer ([place])    skin   Fibromyalgia    GERD (gastroesophageal reflux disease)    Hypertension    Osteoarthritis    Pneumonia      Family History  Problem Relation Age of Onset   Breast cancer Maternal Aunt    Ovarian cancer Sister    Breast cancer Cousin     Past Surgical History:  Procedure Laterality Date   ABDOMINAL HYSTERECTOMY  1996   BREAST BIOPSY Left 2011   stereo done by dr. Bary Castilla -benign   CARPAL TUNNEL RELEASE Right 11/24/2021   Procedure: RIGHT CARPAL TUNNEL RELEASE;  Surgeon: Sherilyn Cooter, MD;  Location: [place];  Service: Orthopedics;  Laterality: Right;   COLONOSCOPY     COLONOSCOPY WITH PROPOFOL N/A 05/30/2016   Procedure: COLONOSCOPY WITH PROPOFOL;  Surgeon: Lollie Sails, MD;  Location: [facility] ENDOSCOPY;  Service: Endoscopy;  Laterality: N/A;   DILATION AND CURETTAGE OF UTERUS     x3   HERNIA REPAIR  2012   JOINT REPLACEMENT     KNEE ARTHROSCOPY WITH MEDIAL MENISECTOMY Left 02/18/2016   Procedure: KNEE ARTHROSCOPY WITH PARTIAL MEDIAL MENISECTOMY;  Surgeon: Hessie Knows, MD;  Location: [facility] ORS;  Service: Orthopedics;  Laterality: Left;   TOTAL KNEE ARTHROPLASTY Right 08/10/2020   Procedure: RIGHT TOTAL KNEE ARTHROPLASTY;  Surgeon: Leandrew Koyanagi, MD;  Location: [place];  Service: Orthopedics;  Laterality: Right;   TOTAL KNEE ARTHROPLASTY Left 11/02/2020   Procedure:  LEFT TOTAL KNEE ARTHROPLASTY;  Surgeon: Leandrew Koyanagi, MD;  Location: [place];  Service: Orthopedics;  Laterality: Left;   TUBAL LIGATION  1984   VARICOSE VEIN SURGERY Bilateral    Social History   Occupational History   Not on file  Tobacco Use   Smoking status: Never   Smokeless tobacco: Never  Vaping Use   Vaping Use: Never used  Substance and Sexual Activity   Alcohol use: Yes    Comment: once per month   Drug use: No   Sexual activity: Not on file

## 2022-06-28 ENCOUNTER — Other Ambulatory Visit: Payer: Self-pay | Admitting: Family Medicine

## 2022-06-28 DIAGNOSIS — Z1231 Encounter for screening mammogram for malignant neoplasm of breast: Secondary | ICD-10-CM

## 2022-07-06 ENCOUNTER — Other Ambulatory Visit: Payer: Self-pay | Admitting: Physician Assistant

## 2022-07-07 ENCOUNTER — Ambulatory Visit
Admission: RE | Admit: 2022-07-07 | Discharge: 2022-07-07 | Disposition: A | Discharge status: Home / Self Care | Payer: Medicare Other | Source: Ambulatory Visit | Attending: Family Medicine | Admitting: Family Medicine

## 2022-07-07 DIAGNOSIS — Z1231 Encounter for screening mammogram for malignant neoplasm of breast: Secondary | ICD-10-CM | POA: Diagnosis present

## 2022-07-08 ENCOUNTER — Telehealth: Payer: Self-pay | Admitting: Orthopaedic Surgery

## 2022-07-08 NOTE — Telephone Encounter (Signed)
Patient would like to proceed with scheduling left carpal tunnel release. Please provide surgery sheet if surgery is in order. If any questions, patient can be reached at [phone].

## 2022-07-28 ENCOUNTER — Other Ambulatory Visit: Payer: Self-pay | Admitting: Physician Assistant

## 2022-07-28 DIAGNOSIS — G5602 Carpal tunnel syndrome, left upper limb: Secondary | ICD-10-CM | POA: Diagnosis not present

## 2022-07-28 MED ORDER — ONDANSETRON HCL 4 MG PO TABS: 4.0000 mg | ORAL_TABLET | Freq: Three times a day (TID) | ORAL | 0 refills | Status: DC | PRN

## 2022-07-28 MED ORDER — HYDROCODONE-ACETAMINOPHEN 5-325 MG PO TABS: 1.0000 | ORAL_TABLET | Freq: Three times a day (TID) | ORAL | 0 refills | Status: DC | PRN

## 2022-07-30 IMAGING — CR DG KNEE 1-2V*R*
1 series · 2 of 2 positions shown · non-contrast
Comparison: None.

CLINICAL DATA: Pain

EXAM:
RIGHT KNEE - 1-2 VIEW

[Series 1: dg knee 1-2 views right · 0.14mm/px · 2 of 2 slices shown]
[im 1/2]
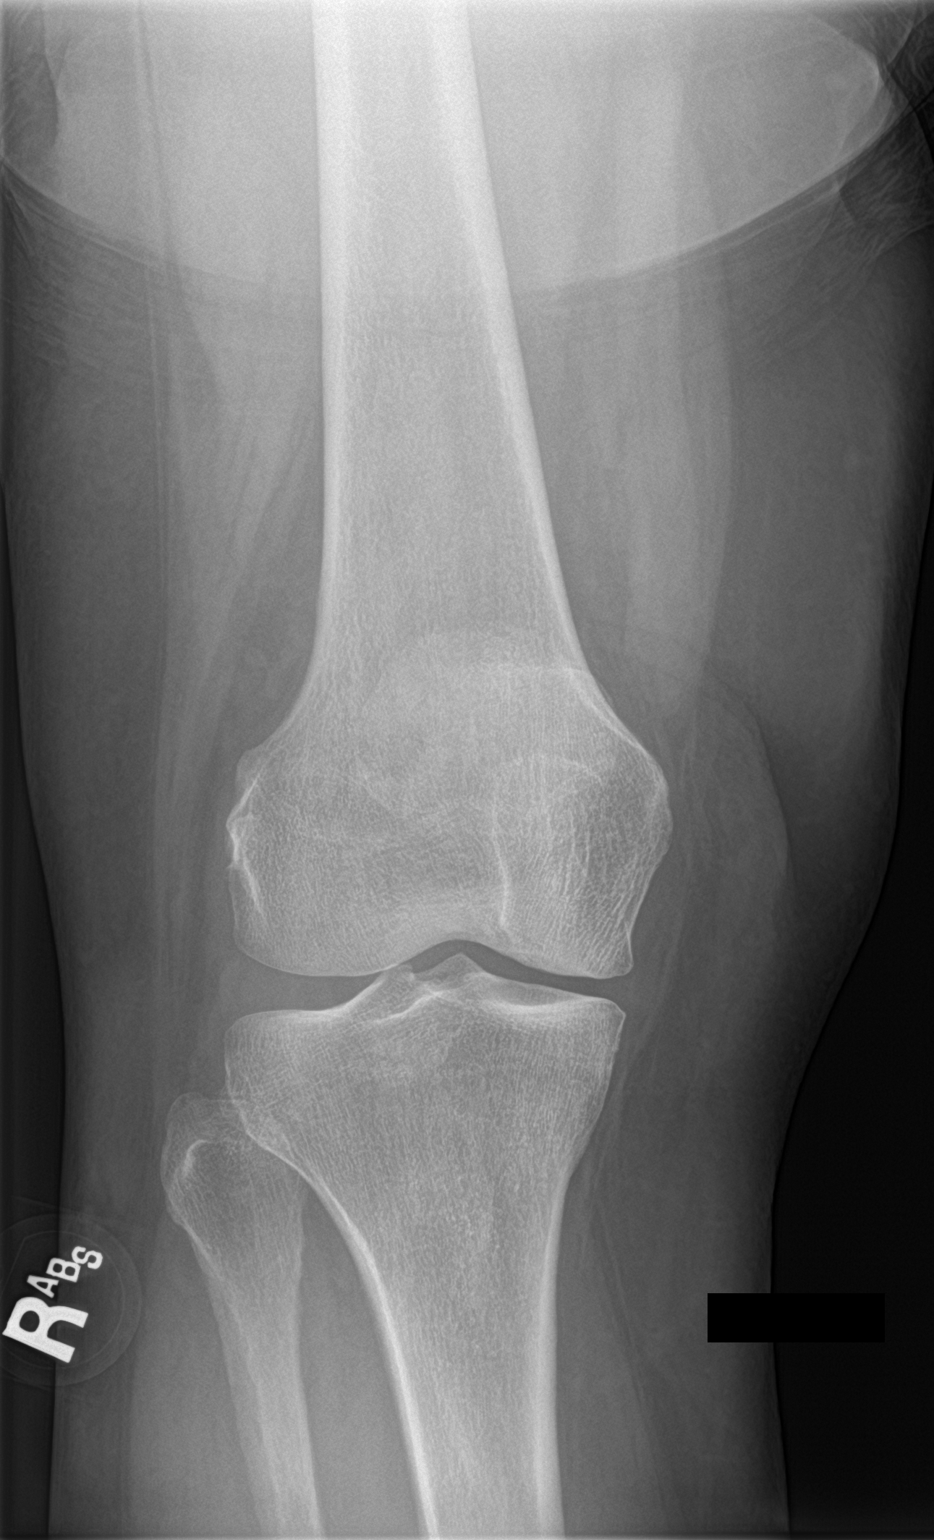
[im 2/2]
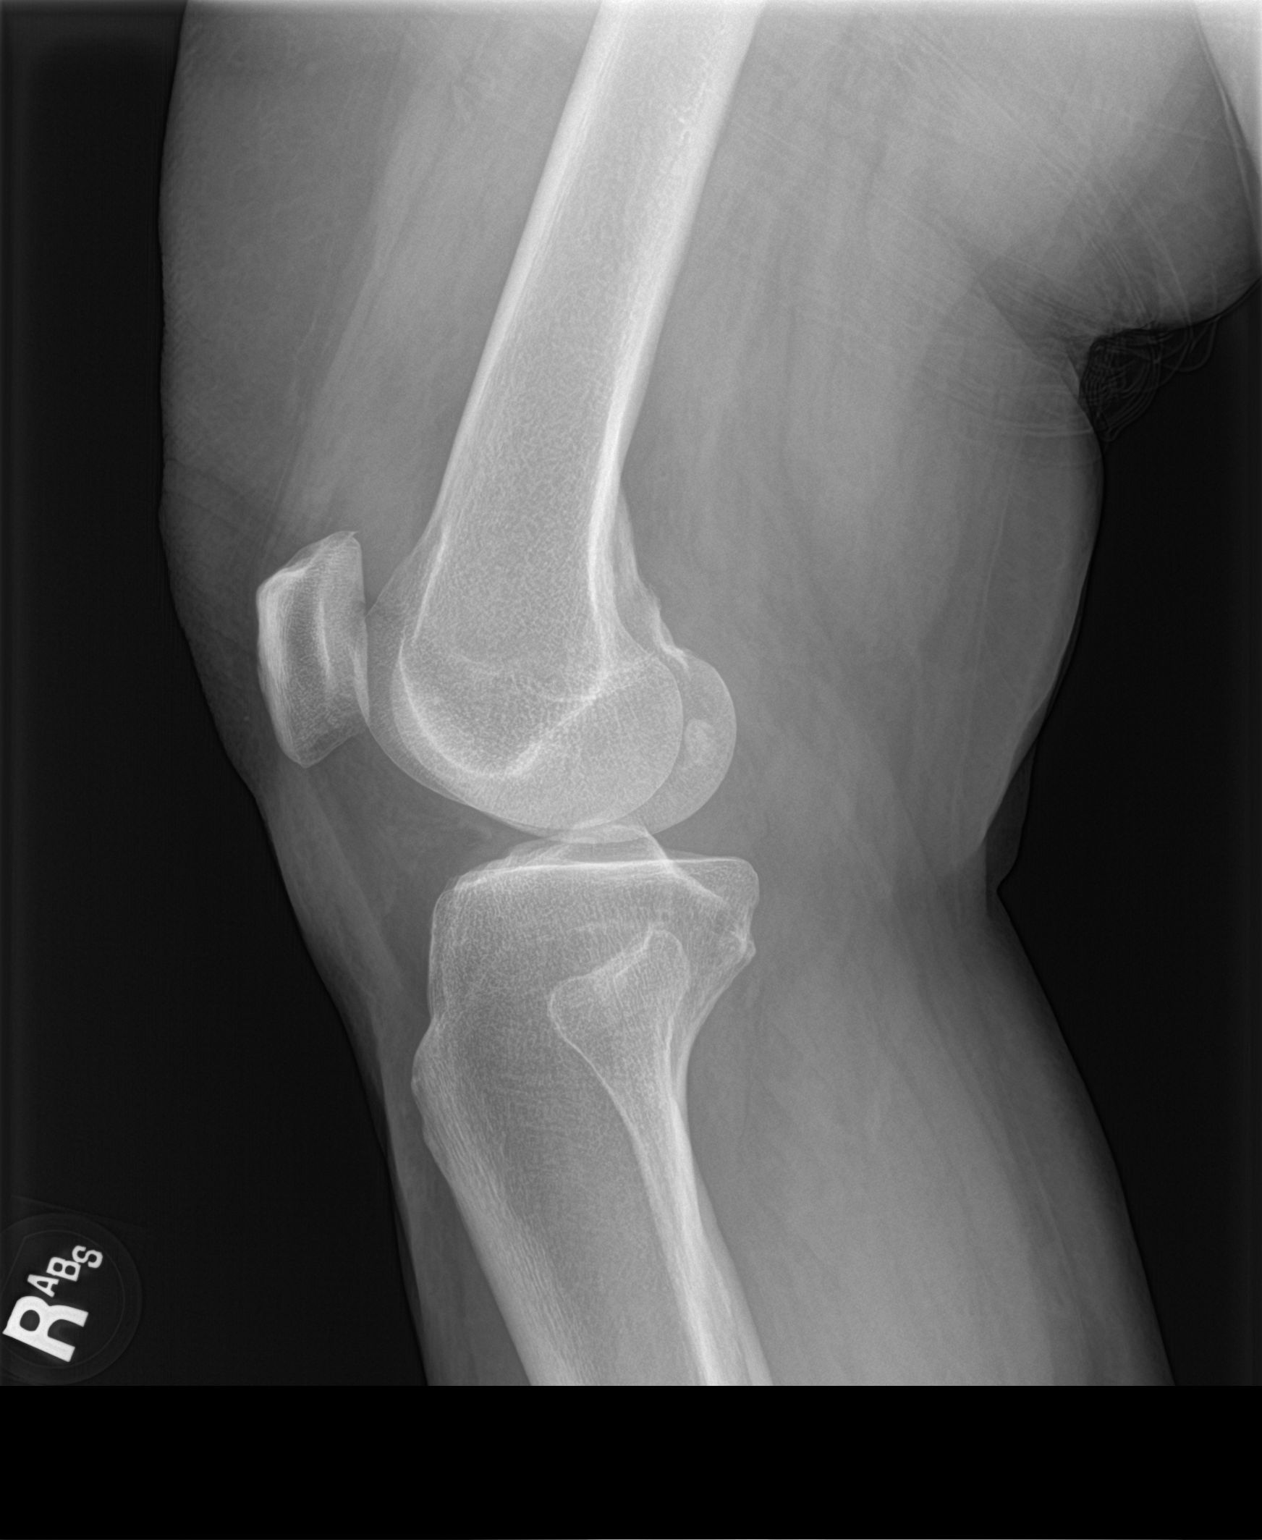

[2 of 2 positions shown; findings below may reference images not displayed]

FINDINGS: Standing frontal and standing lateral views were obtained. No
fracture or dislocation. There is a small joint effusion. There is
mild narrowing medially. Other joint spaces appear normal. No
erosive change.
IMPRESSION: Mild narrowing medially. Other joint spaces appear normal. Small
joint effusion. No fracture or dislocation.

## 2022-08-05 ENCOUNTER — Ambulatory Visit (INDEPENDENT_AMBULATORY_CARE_PROVIDER_SITE_OTHER): Payer: Medicare Other | Admitting: Orthopaedic Surgery

## 2022-08-05 ENCOUNTER — Encounter: Payer: Self-pay | Admitting: Orthopaedic Surgery

## 2022-08-05 DIAGNOSIS — G5601 Carpal tunnel syndrome, right upper limb: Secondary | ICD-10-CM

## 2022-08-05 NOTE — Progress Notes (Signed)
Post-Op Visit Note   Patient: Anna Mccarthy           Date of Birth: July 28, 1955           MRN: [id number] Visit Date: 08/05/2022 PCP: Maryland Pink, MD   Assessment & Plan:  Chief Complaint:  Chief Complaint  Patient presents with   Left Wrist - Follow-up    Left carpal tunnel release 07/28/2022   Visit Diagnoses:  1. Carpal tunnel syndrome, right upper limb     Plan: [place] is 1 week status post right carpal tunnel release.  Doing as well as expected.  She has noticed improvement in carpal tunnel symptoms ready.  Sleeping better overall.  Exam shows intact surgical incision.  No signs of infection.  She can almost oppose thumb tip to fifth metacarpal head.  Normal capillary refill.  Zyana will wear carpal tunnel splint for the next week and wean as tolerated.  Follow-up next week for suture removal.  Follow-Up Instructions: Return in about 1 week (around 08/12/2022).   Orders:  No orders of the defined types were placed in this encounter.  No orders of the defined types were placed in this encounter.   Imaging: No results found.  PMFS History: Patient Active Problem List   Diagnosis Date Noted   Carpal tunnel syndrome, right upper limb    Bilateral carpal tunnel syndrome 10/07/2021   Primary osteoarthritis of left knee 11/02/2020   Status post left knee replacement 11/02/2020   Status post total right knee replacement 08/10/2020   Primary osteoarthritis of right knee 01/30/2020   Chronic pain of left knee 09/11/2018   Meniscus degeneration, left 09/11/2018   Fibromyalgia 09/11/2018   Chronic pain syndrome 09/11/2018   Venous ulcer ([place]) 02/02/2017   Varicose veins of bilateral lower extremities with pain 05/16/2016   Chronic venous insufficiency 05/16/2016   Pain in limb 05/16/2016   Swelling of limb 05/16/2016   DJD (degenerative joint disease) 05/16/2016   Past Medical History:  Diagnosis Date   Allergy    Anxiety    Arthritis    Bronchitis    Cancer  ([address])    skin   Fibromyalgia    GERD (gastroesophageal reflux disease)    Hypertension    Osteoarthritis    Pneumonia     Family History  Problem Relation Age of Onset   Breast cancer Maternal Aunt    Ovarian cancer Sister    Breast cancer Cousin     Past Surgical History:  Procedure Laterality Date   ABDOMINAL HYSTERECTOMY  1996   BREAST BIOPSY Left 2011   stereo done by dr. Bary Castilla -benign   CARPAL TUNNEL RELEASE Right 11/24/2021   Procedure: RIGHT CARPAL TUNNEL RELEASE;  Surgeon: Sherilyn Cooter, MD;  Location: [place];  Service: Orthopedics;  Laterality: Right;   COLONOSCOPY     COLONOSCOPY WITH PROPOFOL N/A 05/30/2016   Procedure: COLONOSCOPY WITH PROPOFOL;  Surgeon: Lollie Sails, MD;  Location[facility] ENDOSCOPY;  Service: Endoscopy;  Laterality: N/A;   DILATION AND CURETTAGE OF UTERUS     x3   HERNIA REPAIR  2012   JOINT REPLACEMENT     KNEE ARTHROSCOPY WITH MEDIAL MENISECTOMY Left 02/18/2016   Procedure: KNEE ARTHROSCOPY WITH PARTIAL MEDIAL MENISECTOMY;  Surgeon: Hessie Knows, MD;  Location: [facility] ORS;  Service: Orthopedics;  Laterality: Left;   TOTAL KNEE ARTHROPLASTY Right 08/10/2020   Procedure: RIGHT TOTAL KNEE ARTHROPLASTY;  Surgeon: Leandrew Koyanagi, MD;  Location: [place];  Service: Orthopedics;  Laterality: Right;   TOTAL KNEE ARTHROPLASTY Left 11/02/2020   Procedure: LEFT TOTAL KNEE ARTHROPLASTY;  Surgeon: Leandrew Koyanagi, MD;  Location: [place];  Service: Orthopedics;  Laterality: Left;   TUBAL LIGATION  1984   VARICOSE VEIN SURGERY Bilateral    Social History   Occupational History   Not on file  Tobacco Use   Smoking status: Never   Smokeless tobacco: Never  Vaping Use   Vaping Use: Never used  Substance and Sexual Activity   Alcohol use: Yes    Comment: once per month   Drug use: No   Sexual activity: Not on file

## 2022-08-16 ENCOUNTER — Ambulatory Visit (INDEPENDENT_AMBULATORY_CARE_PROVIDER_SITE_OTHER): Payer: Medicare Other | Admitting: Physician Assistant

## 2022-08-16 DIAGNOSIS — Z9889 Other specified postprocedural states: Secondary | ICD-10-CM

## 2022-08-16 DIAGNOSIS — G5602 Carpal tunnel syndrome, left upper limb: Secondary | ICD-10-CM

## 2022-08-16 NOTE — Progress Notes (Signed)
Post-Op Visit Note   Patient: Anna Mccarthy           Date of Birth: [birth year]           MRN: [id number] Visit Date: 08/16/2022 PCP: Maryland Pink, MD   Assessment & Plan:  Chief Complaint:  Chief Complaint  Patient presents with   Right Hand - Routine Post Op   Visit Diagnoses:  1. Carpal tunnel syndrome, left upper limb   2. S/P carpal tunnel release     Plan: Patient is a pleasant 67 year old female who comes in today 2-1/2 weeks status post left carpal tunnel release 07/28/2022.  This was for severe compression of the median nerve.  She has been doing well.  No pain or paresthesias.  Examination of the left hand reveals a well-healed surgical incision with nylon sutures in place.  No evidence of infection or cellulitis.  Fingers are warm well-perfused.  She is neurovascular intact distally.  Today, sutures were removed and Steri-Strips applied.  She will start nerve gliding exercises.  No heavy lifting or submerging her hand underwater for 2 weeks.  Follow-up in 2 weeks for recheck.  Call with concerns or questions in the meantime.  Follow-Up Instructions: Return in about 2 weeks (around 08/30/2022).   Orders:  No orders of the defined types were placed in this encounter.  No orders of the defined types were placed in this encounter.   Imaging: No new imaging  PMFS History: Patient Active Problem List   Diagnosis Date Noted   Carpal tunnel syndrome, right upper limb    Bilateral carpal tunnel syndrome 10/07/2021   Primary osteoarthritis of left knee 11/02/2020   Status post left knee replacement 11/02/2020   Status post total right knee replacement 08/10/2020   Primary osteoarthritis of right knee 01/30/2020   Chronic pain of left knee 09/11/2018   Meniscus degeneration, left 09/11/2018   Fibromyalgia 09/11/2018   Chronic pain syndrome 09/11/2018   Venous ulcer ([place]) 02/02/2017   Varicose veins of bilateral lower extremities with pain 05/16/2016   Chronic venous  insufficiency 05/16/2016   Pain in limb 05/16/2016   Swelling of limb 05/16/2016   DJD (degenerative joint disease) 05/16/2016   Past Medical History:  Diagnosis Date   Allergy    Anxiety    Arthritis    Bronchitis    Cancer ([place])    skin   Fibromyalgia    GERD (gastroesophageal reflux disease)    Hypertension    Osteoarthritis    Pneumonia     Family History  Problem Relation Age of Onset   Breast cancer Maternal Aunt    Ovarian cancer Sister    Breast cancer Cousin     Past Surgical History:  Procedure Laterality Date   ABDOMINAL HYSTERECTOMY  1996   BREAST BIOPSY Left 2011   stereo done by dr. Bary Castilla -benign   CARPAL TUNNEL RELEASE Right 11/24/2021   Procedure: RIGHT CARPAL TUNNEL RELEASE;  Surgeon: Sherilyn Cooter, MD;  Location: [place];  Service: Orthopedics;  Laterality: Right;   COLONOSCOPY     COLONOSCOPY WITH PROPOFOL N/A 05/30/2016   Procedure: COLONOSCOPY WITH PROPOFOL;  Surgeon: Lollie Sails, MD;  Location: [facility] ENDOSCOPY;  Service: Endoscopy;  Laterality: N/A;   DILATION AND CURETTAGE OF UTERUS     x3   HERNIA REPAIR  2012   JOINT REPLACEMENT     KNEE ARTHROSCOPY WITH MEDIAL MENISECTOMY Left 02/18/2016   Procedure: KNEE ARTHROSCOPY WITH PARTIAL MEDIAL MENISECTOMY;  Surgeon: Hessie Knows, MD;  Location: [facility];  Service: Orthopedics;  Laterality: Left;   TOTAL KNEE ARTHROPLASTY Right 08/10/2020   Procedure: RIGHT TOTAL KNEE ARTHROPLASTY;  Surgeon: Leandrew Koyanagi, MD;  Location: [place];  Service: Orthopedics;  Laterality: Right;   TOTAL KNEE ARTHROPLASTY Left 11/02/2020   Procedure: LEFT TOTAL KNEE ARTHROPLASTY;  Surgeon: Leandrew Koyanagi, MD;  Location: [place];  Service: Orthopedics;  Laterality: Left;   TUBAL LIGATION  1984   VARICOSE VEIN SURGERY Bilateral    Social History   Occupational History   Not on file  Tobacco Use   Smoking status: Never   Smokeless tobacco: Never  Vaping Use   Vaping Use: Never used  Substance and  Sexual Activity   Alcohol use: Yes    Comment: once per month   Drug use: No   Sexual activity: Not on file

## 2022-08-17 ENCOUNTER — Other Ambulatory Visit (INDEPENDENT_AMBULATORY_CARE_PROVIDER_SITE_OTHER): Payer: Self-pay | Admitting: Nurse Practitioner

## 2022-08-17 DIAGNOSIS — I83813 Varicose veins of bilateral lower extremities with pain: Secondary | ICD-10-CM

## 2022-08-18 ENCOUNTER — Ambulatory Visit (INDEPENDENT_AMBULATORY_CARE_PROVIDER_SITE_OTHER): Payer: 59 | Admitting: Vascular Surgery

## 2022-08-18 ENCOUNTER — Ambulatory Visit (INDEPENDENT_AMBULATORY_CARE_PROVIDER_SITE_OTHER): Payer: Medicare Other

## 2022-08-18 ENCOUNTER — Encounter (INDEPENDENT_AMBULATORY_CARE_PROVIDER_SITE_OTHER): Payer: 59 | Admitting: Vascular Surgery

## 2022-08-18 ENCOUNTER — Encounter (INDEPENDENT_AMBULATORY_CARE_PROVIDER_SITE_OTHER): Payer: Self-pay | Admitting: Nurse Practitioner

## 2022-08-18 VITALS — BP 125/84 | HR 71 | Resp 16 | Wt 181.6 lb

## 2022-08-18 DIAGNOSIS — I872 Venous insufficiency (chronic) (peripheral): Secondary | ICD-10-CM

## 2022-08-18 DIAGNOSIS — I83813 Varicose veins of bilateral lower extremities with pain: Secondary | ICD-10-CM

## 2022-08-18 DIAGNOSIS — M1712 Unilateral primary osteoarthritis, left knee: Secondary | ICD-10-CM

## 2022-08-18 NOTE — Progress Notes (Signed)
MRN : [id number]  Anna Mccarthy is a 67 y.o. (1955/09/11) female who presents with chief complaint of varicose veins hurt.  History of Present Illness:  The patient returns to the office for followup status post laser ablation of the left saphenous vein in 2017.  The patient notes over the past few years there has been an increasing number of recurrent varicosities right leg seems to be somewhat more affected than the left but it is happening bilaterally. The patient notes multiple residual varicosities bilaterally which continued to hurt with dependent positions and remained tender to palpation. The patient's swelling is a persistent issue.  However, she has been wearing her graduated compression stockings since her past treatment and notices that the edema is well-controlled.  The patient continues to wear graduated compression stockings on a daily basis but these are not eliminating the pain and discomfort.  She continues to exercise on a routine basis.  The patient continues to use over-the-counter anti-inflammatory medications to treat the pain and related symptoms but this has not given the patient relief. The patient notes the pain in the lower extremities is causing problems with daily exercise, problems at work and even with household activities such as preparing meals and doing dishes.  The patient is otherwise done well and there have been no complications related to the past laser procedure or interval changes in the patient's overall   Venous ultrasound done today shows successful ablation of the left great saphenous vein.  No evidence of stricture or stenosis or chronic changes of the deep venous system bilaterally.  Some trivial reflux is noted in the right popliteal vein.  No reflux noted in the right superficial venous system.   Current Meds  Medication Sig   albuterol (VENTOLIN HFA) 108 (90 Base) MCG/ACT inhaler Inhale 1-2 puffs into the lungs every 6 (six) hours as needed  for wheezing or shortness of breath.   DULoxetine (CYMBALTA) 20 MG capsule Take 20 mg by mouth 2 (two) times daily.   escitalopram (LEXAPRO) 20 MG tablet Take 20 mg by mouth at bedtime.    fluocinonide (LIDEX) 0.05 % external solution Apply 1 application topically 2 (two) times daily as needed (acne).   hydrochlorothiazide (HYDRODIURIL) 25 MG tablet Take 25 mg by mouth daily.   HYDROcodone-acetaminophen (NORCO) 5-325 MG tablet Take 1 tablet by mouth 3 (three) times daily as needed.   hydroxychloroquine (PLAQUENIL) 200 MG tablet Take 200 mg by mouth 2 (two) times daily.   montelukast (SINGULAIR) 10 MG tablet Take 1 tablet (10 mg total) by mouth at bedtime.   omeprazole (PRILOSEC) 40 MG capsule Take 40 mg by mouth daily.   ondansetron (ZOFRAN) 4 MG tablet Take 1 tablet (4 mg total) by mouth every 8 (eight) hours as needed for nausea or vomiting.   REPATHA 140 MG/ML SOSY every 14 (fourteen) days.    Past Medical History:  Diagnosis Date   Allergy    Anxiety    Arthritis    Bronchitis    Cancer ([place])    skin   Fibromyalgia    GERD (gastroesophageal reflux disease)    Hypertension    Osteoarthritis    Pneumonia     Past Surgical History:  Procedure Laterality Date   ABDOMINAL HYSTERECTOMY  1996   BREAST BIOPSY Left 2011   stereo done by dr. Bary Castilla -benign   CARPAL TUNNEL RELEASE Right 11/24/2021   Procedure: RIGHT CARPAL TUNNEL RELEASE;  Surgeon: Sherilyn Cooter, MD;  Location:  [place];  Service: Orthopedics;  Laterality: Right;   COLONOSCOPY     COLONOSCOPY WITH PROPOFOL N/A 05/30/2016   Procedure: COLONOSCOPY WITH PROPOFOL;  Surgeon: Lollie Sails, MD;  Location: [facility] ENDOSCOPY;  Service: Endoscopy;  Laterality: N/A;   DILATION AND CURETTAGE OF UTERUS     x3   HERNIA REPAIR  2012   JOINT REPLACEMENT     KNEE ARTHROSCOPY WITH MEDIAL MENISECTOMY Left 02/18/2016   Procedure: KNEE ARTHROSCOPY WITH PARTIAL MEDIAL MENISECTOMY;  Surgeon: Hessie Knows, MD;   Location: [facility];  Service: Orthopedics;  Laterality: Left;   TOTAL KNEE ARTHROPLASTY Right 08/10/2020   Procedure: RIGHT TOTAL KNEE ARTHROPLASTY;  Surgeon: Leandrew Koyanagi, MD;  Location: [place];  Service: Orthopedics;  Laterality: Right;   TOTAL KNEE ARTHROPLASTY Left 11/02/2020   Procedure: LEFT TOTAL KNEE ARTHROPLASTY;  Surgeon: Leandrew Koyanagi, MD;  Location: [place];  Service: Orthopedics;  Laterality: Left;   TUBAL LIGATION  1984   VARICOSE VEIN SURGERY Bilateral     Social History Social History   Tobacco Use   Smoking status: Never   Smokeless tobacco: Never  Vaping Use   Vaping Use: Never used  Substance Use Topics   Alcohol use: Yes    Comment: once per month   Drug use: No    Family History Family History  Problem Relation Age of Onset   Breast cancer Maternal Aunt    Ovarian cancer Sister    Breast cancer Cousin     Allergies  Allergen Reactions   Percocet [Oxycodone-Acetaminophen] Itching   Sulfa Antibiotics     Gi problems      REVIEW OF SYSTEMS (Negative unless checked)  Constitutional: []$ Weight loss  []$ Fever  []$ Chills Cardiac: []$ Chest pain   []$ Chest pressure   []$ Palpitations   []$ Shortness of breath when laying flat   []$ Shortness of breath with exertion. Vascular:  []$ Pain in legs with walking   [x]$ Pain in legs with standing  []$ History of DVT   []$ Phlebitis   []$ Swelling in legs   [x]$ Varicose veins   []$ Non-healing ulcers Pulmonary:   []$ Uses home oxygen   []$ Productive cough   []$ Hemoptysis   []$ Wheeze  []$ COPD   []$ Asthma Neurologic:  []$ Dizziness   []$ Seizures   []$ History of stroke   []$ History of TIA  []$ Aphasia   []$ Vissual changes   []$ Weakness or numbness in arm   []$ Weakness or numbness in leg Musculoskeletal:   []$ Joint swelling   []$ Joint pain   []$ Low back pain Hematologic:  []$ Easy bruising  []$ Easy bleeding   []$ Hypercoagulable state   []$ Anemic Gastrointestinal:  []$ Diarrhea   []$ Vomiting  []$ Gastroesophageal reflux/heartburn   []$ Difficulty swallowing. Genitourinary:   []$ Chronic kidney disease   []$ Difficult urination  []$ Frequent urination   []$ Blood in urine Skin:  []$ Rashes   []$ Ulcers  Psychological:  []$ History of anxiety   []$  History of major depression.  Physical Examination  Vitals:   08/18/22 1320  BP: 125/84  Pulse: 71  Resp: 16  Weight: 181 lb 9.6 oz (82.4 kg)   Body mass index is 32.69 kg/m. Gen: WD/WN, NAD Head: [place]/AT, No temporalis wasting.  Ear/Nose/Throat: Hearing grossly intact, nares w/o erythema or drainage, pinna without lesions Eyes: PER, EOMI, sclera nonicteric.  Neck: Supple, no gross masses.  No JVD.  Pulmonary:  Good air movement, no audible wheezing, no use of accessory muscles.  Cardiac: RRR, precordium not hyperdynamic. Vascular:  Large varicosities present, greater than 4-6 mm bilaterally.  Veins are tender to palpation  Mild  venous stasis changes to the legs bilaterally.  Trace soft pitting edema CEAP C3sEpAsPr Vessel Right Left  Radial Palpable Palpable  Gastrointestinal: soft, non-distended. No guarding/no peritoneal signs.  Musculoskeletal: M/S 5/5 throughout.  No deformity.  Neurologic: CN 2-12 intact. Pain and light touch intact in extremities.  Symmetrical.  Speech is fluent. Motor exam as listed above. Psychiatric: Judgment intact, Mood & affect appropriate for pt's clinical situation. Dermatologic: Venous rashes no ulcers noted.  No changes consistent with cellulitis. Lymph : No lichenification or skin changes of chronic lymphedema.  CBC Lab Results  Component Value Date   WBC 5.3 11/03/2020   HGB 10.4 (L) 11/03/2020   HCT 32.5 (L) 11/03/2020   MCV 88.8 11/03/2020   PLT 312 11/03/2020    BMET    Component Value Date/Time   NA 134 (L) 11/16/2021 1103   NA 135 10/07/2014 1837   K 4.6 11/16/2021 1103   K 3.7 10/07/2014 1837   CL 98 11/16/2021 1103   CL 99 (L) 10/07/2014 1837   CO2 27 11/16/2021 1103   CO2 28 10/07/2014 1837   GLUCOSE 96 11/16/2021 1103   GLUCOSE 94 10/07/2014 1837   BUN 9  11/16/2021 1103   BUN 18 10/07/2014 1837   CREATININE 0.87 11/16/2021 1103   CREATININE 0.86 10/07/2014 1837   CALCIUM 9.2 11/16/2021 1103   CALCIUM 9.0 10/07/2014 1837   GFRNONAA >60 11/16/2021 1103   GFRNONAA >60 10/07/2014 1837   GFRAA >60 10/07/2014 1837   CrCl cannot be calculated (Patient's most recent lab result is older than the maximum 21 days allowed.).  COAG Lab Results  Component Value Date   INR 1.0 10/29/2020   INR 1.0 08/06/2020    Radiology No results found.   Assessment/Plan 1. Varicose veins of bilateral lower extremities with pain Recommend:  The patient has had successful ablation of the previously incompetent saphenous left great saphenous venous system but still has persistent symptoms of pain and some swelling that are having a negative impact on daily life and daily activities.  There is no indication for further laser ablation at this time.  Patient should undergo injection sclerotherapy to treat the residual varicosities.  The risks, benefits and alternative therapies were reviewed in detail with the patient.  All questions were answered.  The patient agrees to proceed with sclerotherapy at their convenience.  The patient will continue wearing the graduated compression stockings and using the over-the-counter pain medications to treat her symptoms.    2. Chronic venous insufficiency No surgery or intervention at this point in time.   The patient is CEAP C4sEpAsPr   I have discussed with the patient venous insufficiency and why it  causes symptoms. I have discussed with the patient the chronic skin changes that accompany venous insufficiency and the long term sequela such as infection and ulceration.  Patient will begin wearing graduated compression stockings or compression wraps on a daily basis.  The patient will put the compression on first thing in the morning and removing them in the evening. The patient is instructed specifically not to sleep in  the compression.    In addition, behavioral modification including several periods of elevation of the lower extremities during the day will be continued. I have demonstrated that proper elevation is a position with the ankles at heart level.  The patient is instructed to begin routine exercise, especially walking on a daily basis  3. Primary osteoarthritis of left knee Continue NSAID medications as already ordered, these medications  have been reviewed and there are no changes at this time.  Continued activity and therapy was stressed.    Hortencia Pilar, MD  08/18/2022 1:29 PM

## 2022-08-31 ENCOUNTER — Ambulatory Visit (INDEPENDENT_AMBULATORY_CARE_PROVIDER_SITE_OTHER): Payer: Medicare Other | Admitting: Orthopaedic Surgery

## 2022-08-31 DIAGNOSIS — Z9889 Other specified postprocedural states: Secondary | ICD-10-CM

## 2022-08-31 NOTE — Progress Notes (Signed)
Post-Op Visit Note   Patient: Anna Mccarthy           Date of Birth: 1956-05-04           MRN: [id number] Visit Date: 08/31/2022 PCP: Maryland Pink, MD   Assessment & Plan:  Chief Complaint:  Chief Complaint  Patient presents with   Left Hand - Routine Post Op   Visit Diagnoses:  1. S/P carpal tunnel release     Plan: Anna Mccarthy is about 4 weeks s/p left CTR.  Doing well overall.  Just some soreness and tenderness to surgical scar.  Exam of left hand is benign.  Fully healed surgical scar.  Explained the soreness is normal and will get better.  May increase use of hand as tolerated.  Follow up as needed at this point.  Follow-Up Instructions: Return if symptoms worsen or fail to improve.   Orders:  No orders of the defined types were placed in this encounter.  No orders of the defined types were placed in this encounter.   Imaging: No results found.  PMFS History: Patient Active Problem List   Diagnosis Date Noted   Carpal tunnel syndrome, right upper limb    Bilateral carpal tunnel syndrome 10/07/2021   Primary osteoarthritis of left knee 11/02/2020   Status post left knee replacement 11/02/2020   Status post total right knee replacement 08/10/2020   Primary osteoarthritis of right knee 01/30/2020   Chronic pain of left knee 09/11/2018   Meniscus degeneration, left 09/11/2018   Fibromyalgia 09/11/2018   Chronic pain syndrome 09/11/2018   Venous ulcer ([place]) 02/02/2017   Varicose veins of bilateral lower extremities with pain 05/16/2016   Chronic venous insufficiency 05/16/2016   Pain in limb 05/16/2016   Swelling of limb 05/16/2016   DJD (degenerative joint disease) 05/16/2016   Past Medical History:  Diagnosis Date   Allergy    Anxiety    Arthritis    Bronchitis    Cancer ([place])    skin   Fibromyalgia    GERD (gastroesophageal reflux disease)    Hypertension    Osteoarthritis    Pneumonia     Family History  Problem Relation Age of Onset    Breast cancer Maternal Aunt    Ovarian cancer Sister    Breast cancer Cousin     Past Surgical History:  Procedure Laterality Date   ABDOMINAL HYSTERECTOMY  1996   BREAST BIOPSY Left 2011   stereo done by dr. Bary Castilla -benign   CARPAL TUNNEL RELEASE Right 11/24/2021   Procedure: RIGHT CARPAL TUNNEL RELEASE;  Surgeon: Sherilyn Cooter, MD;  Location: [place];  Service: Orthopedics;  Laterality: Right;   COLONOSCOPY     COLONOSCOPY WITH PROPOFOL N/A 05/30/2016   Procedure: COLONOSCOPY WITH PROPOFOL;  Surgeon: Lollie Sails, MD;  Location: [facility] ENDOSCOPY;  Service: Endoscopy;  Laterality: N/A;   DILATION AND CURETTAGE OF UTERUS     x3   HERNIA REPAIR  2012   JOINT REPLACEMENT     KNEE ARTHROSCOPY WITH MEDIAL MENISECTOMY Left 02/18/2016   Procedure: KNEE ARTHROSCOPY WITH PARTIAL MEDIAL MENISECTOMY;  Surgeon: Hessie Knows, MD;  Location: [facility];  Service: Orthopedics;  Laterality: Left;   TOTAL KNEE ARTHROPLASTY Right 08/10/2020   Procedure: RIGHT TOTAL KNEE ARTHROPLASTY;  Surgeon: Leandrew Koyanagi, MD;  Location: [place];  Service: Orthopedics;  Laterality: Right;   TOTAL KNEE ARTHROPLASTY Left 11/02/2020   Procedure: LEFT TOTAL KNEE ARTHROPLASTY;  Surgeon: Leandrew Koyanagi, MD;  Location: [place];  Service: Orthopedics;  Laterality: Left;   TUBAL LIGATION  1984   VARICOSE VEIN SURGERY Bilateral    Social History   Occupational History   Not on file  Tobacco Use   Smoking status: Never   Smokeless tobacco: Never  Vaping Use   Vaping Use: Never used  Substance and Sexual Activity   Alcohol use: Yes    Comment: once per month   Drug use: No   Sexual activity: Not on file

## 2022-11-03 IMAGING — MG DIGITAL SCREENING BILAT W/ TOMO W/ CAD
8 series · 8 of 24 positions shown · non-contrast
Comparison: Previous exam(s).

CLINICAL DATA: Screening.

EXAM:
DIGITAL SCREENING BILATERAL MAMMOGRAM WITH TOMO AND CAD

[R MLO synth-2D]
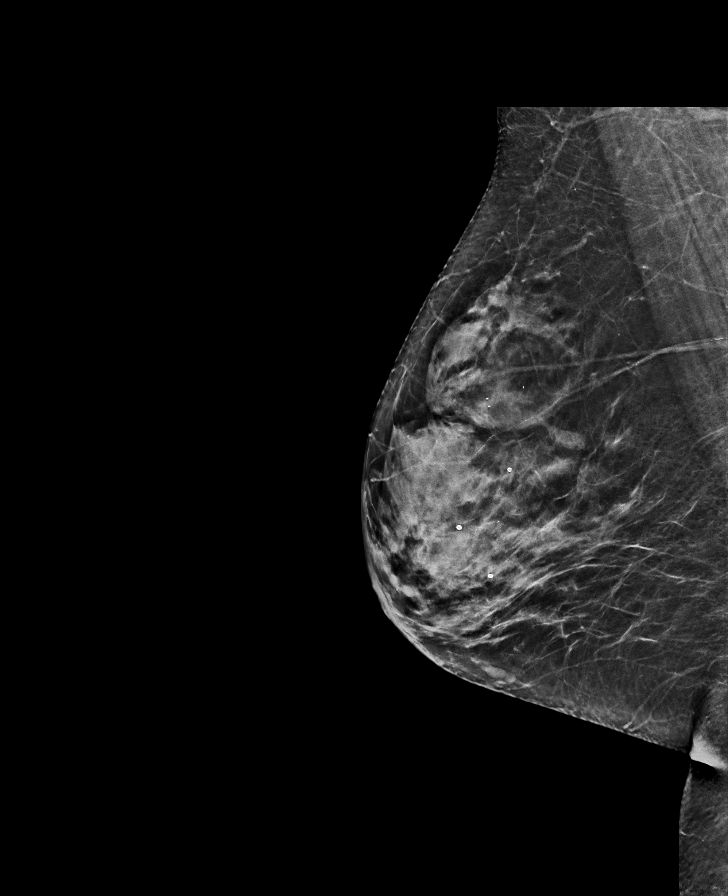

[R CC synth-2D]
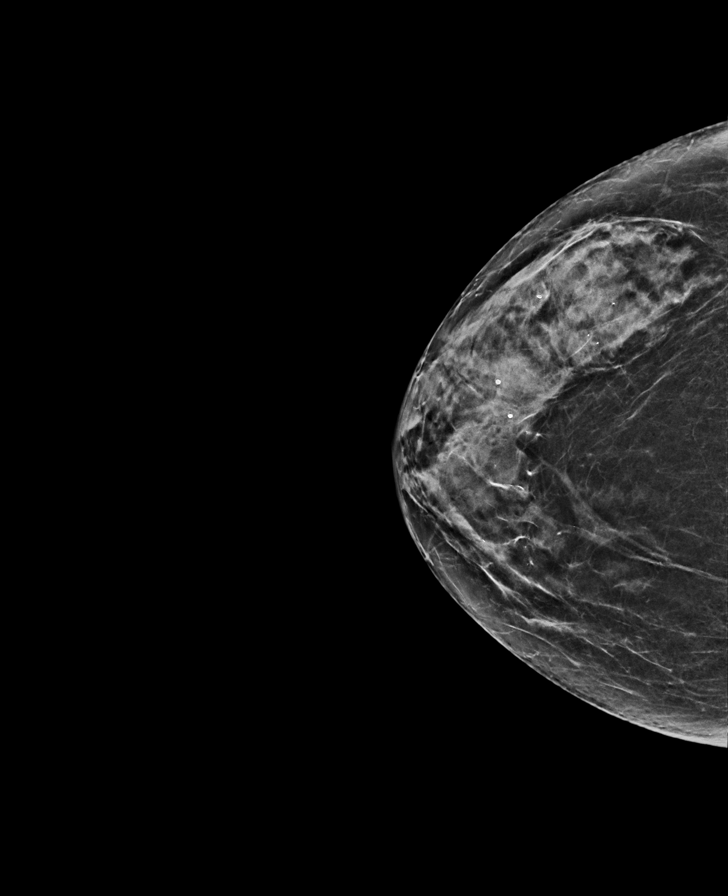

[L MLO synth-2D]
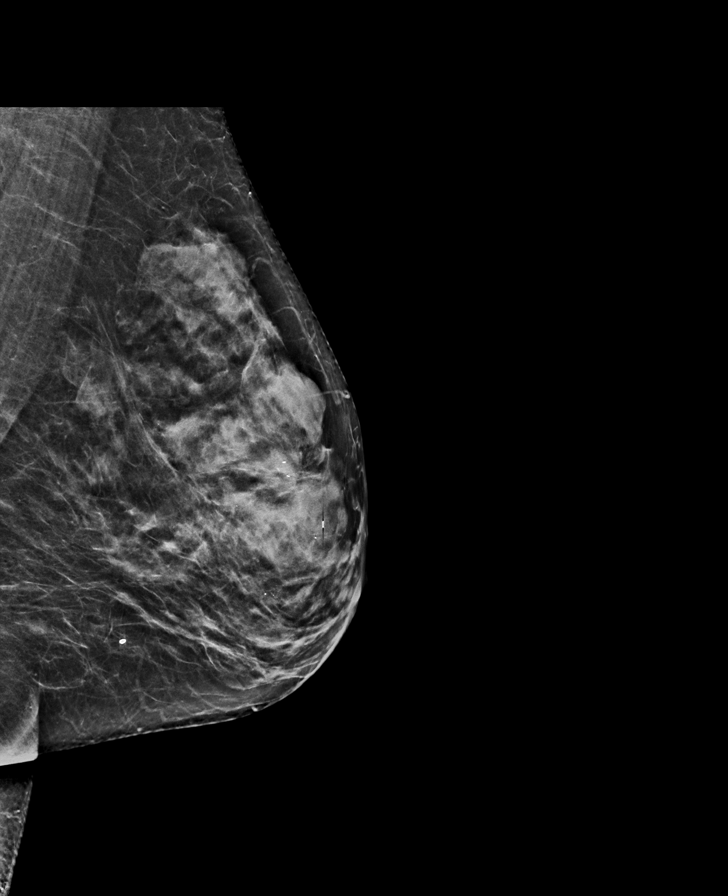

[L CC synth-2D]
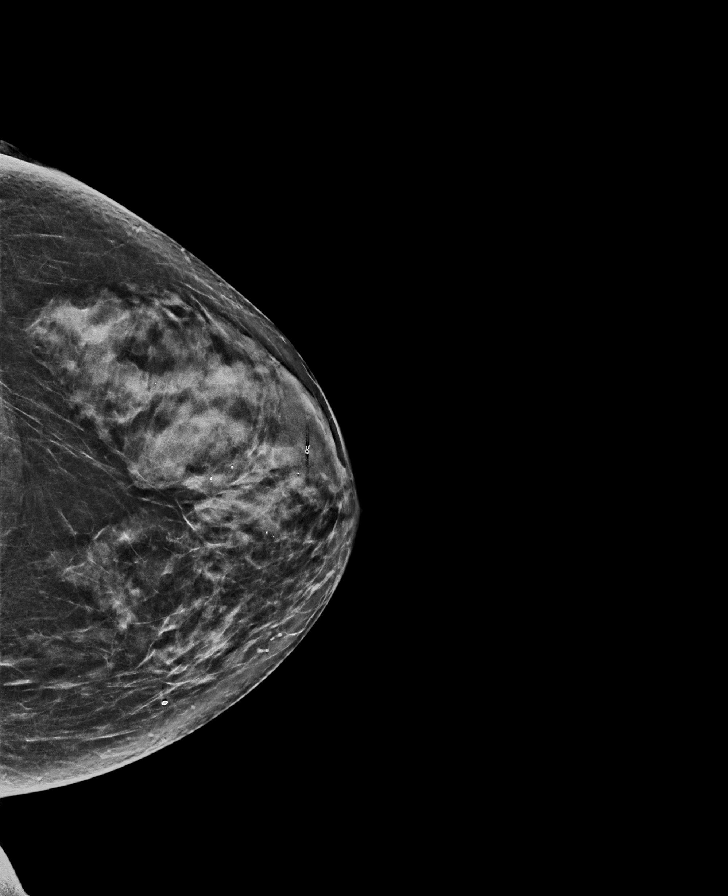

[R MLO tomo · tomo slice 31/61.0]
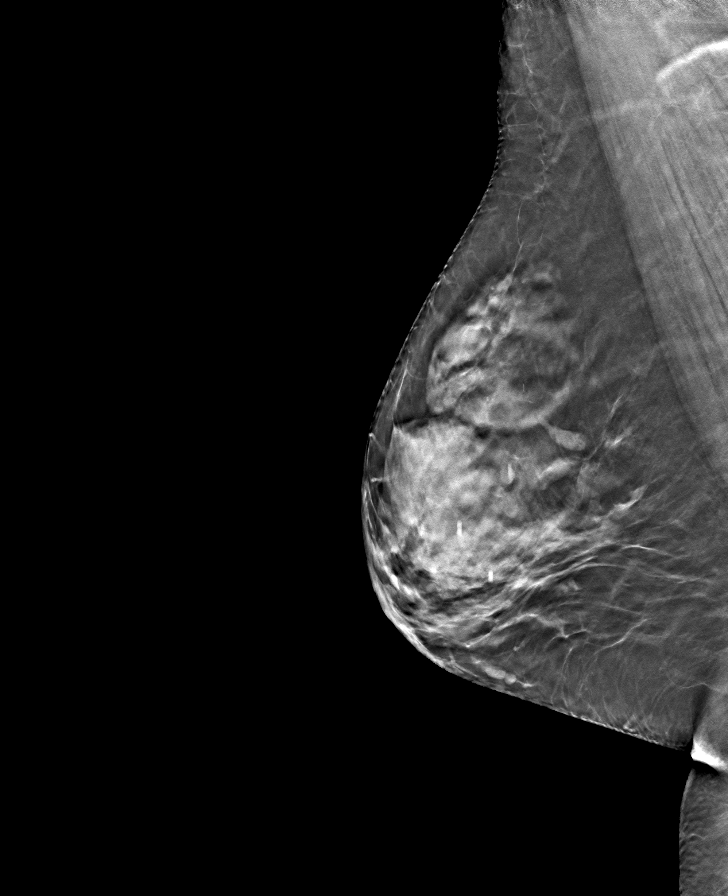

[L CC tomo · tomo slice 33/65.0]
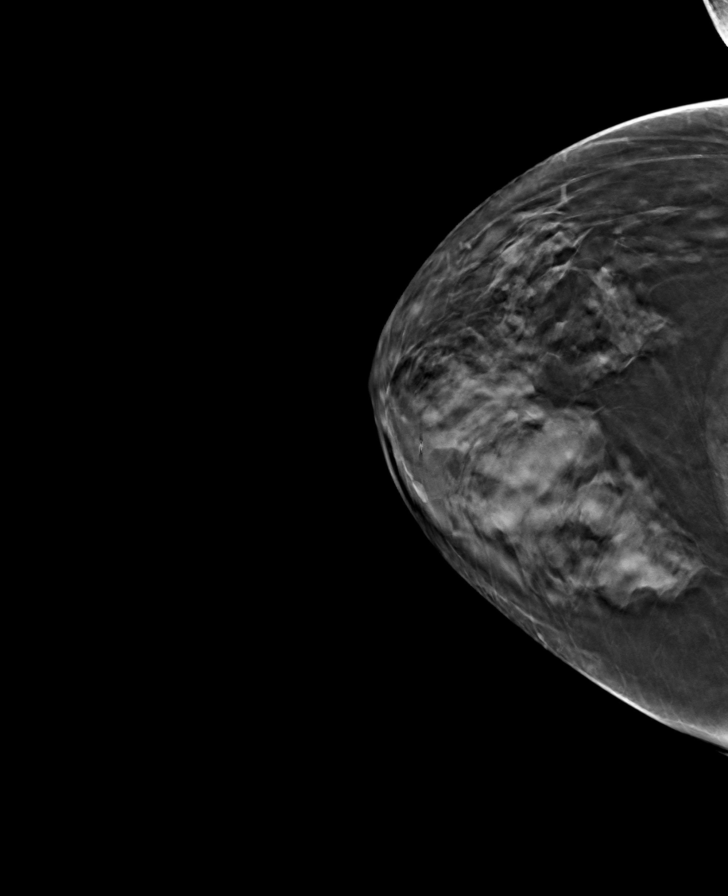

[R CC tomo · tomo slice 29/58.0]
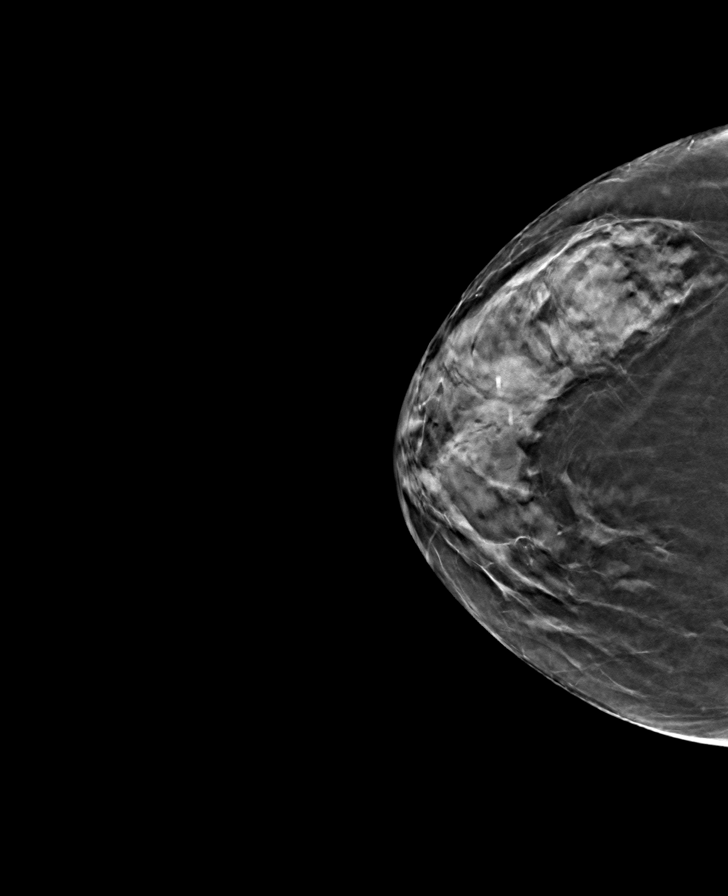

[L MLO tomo · tomo slice 31/61.0]
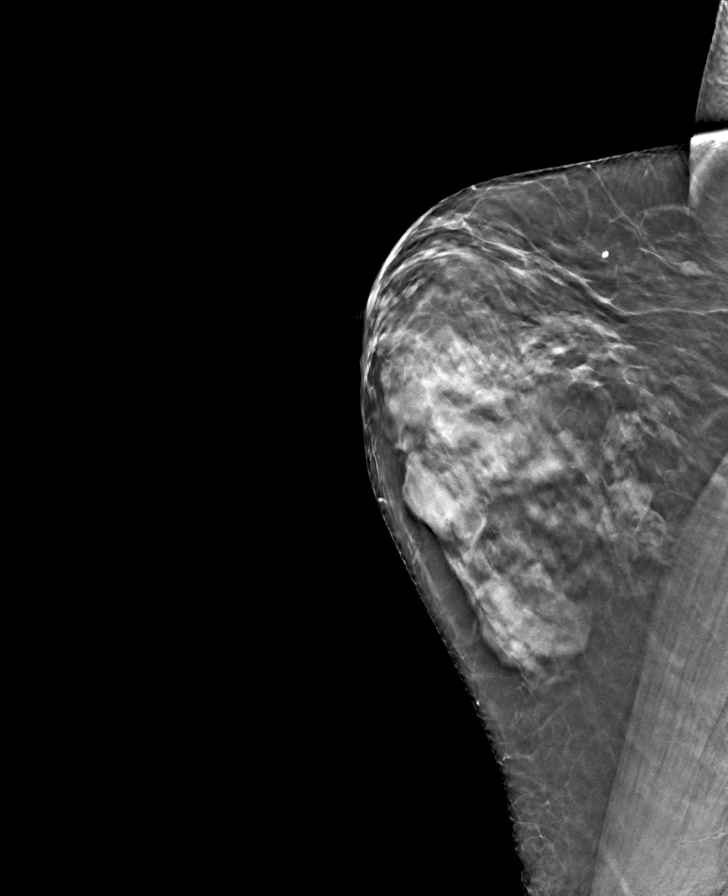

[8 of 24 positions shown; findings below may reference images not displayed]

ACR Breast Density Category c: The breast tissue is heterogeneously
dense, which may obscure small masses.
FINDINGS: There are no findings suspicious for malignancy. Images were
processed with CAD.
IMPRESSION: No mammographic evidence of malignancy. A result letter of this
screening mammogram will be mailed directly to the patient.

RECOMMENDATION:
Screening mammogram in one year. (Code:FT-U-LHB)

BI-RADS CATEGORY  1: Negative.

## 2022-11-09 ENCOUNTER — Telehealth (INDEPENDENT_AMBULATORY_CARE_PROVIDER_SITE_OTHER): Payer: Self-pay | Admitting: Vascular Surgery

## 2022-11-09 NOTE — Telephone Encounter (Signed)
LVM for pt TCB and schedule sclero if still wanting procedure.

## 2022-11-17 ENCOUNTER — Other Ambulatory Visit (INDEPENDENT_AMBULATORY_CARE_PROVIDER_SITE_OTHER): Payer: Medicare Other

## 2022-11-17 ENCOUNTER — Encounter: Payer: Self-pay | Admitting: Orthopaedic Surgery

## 2022-11-17 ENCOUNTER — Ambulatory Visit (INDEPENDENT_AMBULATORY_CARE_PROVIDER_SITE_OTHER): Payer: Medicare Other | Admitting: Orthopaedic Surgery

## 2022-11-17 DIAGNOSIS — Z9889 Other specified postprocedural states: Secondary | ICD-10-CM

## 2022-11-17 DIAGNOSIS — Z96651 Presence of right artificial knee joint: Secondary | ICD-10-CM

## 2022-11-17 DIAGNOSIS — Z96652 Presence of left artificial knee joint: Secondary | ICD-10-CM | POA: Diagnosis not present

## 2022-11-17 NOTE — Progress Notes (Signed)
Office Visit Note   Patient: Anna Mccarthy           Date of Birth: 11-May-1956           MRN: [id number] Visit Date: 11/17/2022              Requested by: Anna Mina, MD [address] [place],  [place] [address] PCP: Anna Mina, MD   Assessment & Plan: Visit Diagnoses:  1. Status post left knee replacement   2. Status post total right knee replacement   3. S/P carpal tunnel release     Plan: Anna Mccarthy is now 2 years status post total knee replacements and 18-month status post left carpal tunnel release.  She is very happy with the outcome from all the surgeries.  She can discontinue dental prophylaxis for the knees.  She will follow-up with me as needed.  Follow-Up Instructions: No follow-ups on file.   Orders:  Orders Placed This Encounter  Procedures   XR Knee 1-2 Views Right   XR Knee 1-2 Views Left   No orders of the defined types were placed in this encounter.     Procedures: No procedures performed   Clinical Data: No additional findings.   Subjective: Chief Complaint  Patient presents with   Left Wrist - Follow-up    Left carpal tunnel release 07/28/2022    HPI Anna Mccarthy follows up today for 2-year postop sequential total knee replacements and 28-month follow-up status post left carpal tunnel release.  She has no complaints about her knees other than some occasional clicking and popping.  She states that her carpal tunnel symptoms are not significantly better.  The tenderness has significantly improved around the surgical site. Review of Systems   Objective: Vital Signs: There were no vitals taken for this visit.  Physical Exam  Ortho Exam Examination of the left hand shows fully healed surgical scar.  She can make a full composite fist. Examination bilateral knees show fully healed surgical scars.  Excellent range of motion.  Collaterals are stable. Specialty Comments:  No specialty comments available.  Imaging: XR Knee 1-2  Views Left  Result Date: 11/17/2022 Stable left total knee replacement in good alignment.   XR Knee 1-2 Views Right  Result Date: 11/17/2022 Stable right total knee replacement in good alignment     PMFS History: Patient Active Problem List   Diagnosis Date Noted   Carpal tunnel syndrome, right upper limb    Bilateral carpal tunnel syndrome 10/07/2021   Primary osteoarthritis of left knee 11/02/2020   Status post left knee replacement 11/02/2020   Status post total right knee replacement 08/10/2020   Primary osteoarthritis of right knee 01/30/2020   Chronic pain of left knee 09/11/2018   Meniscus degeneration, left 09/11/2018   Fibromyalgia 09/11/2018   Chronic pain syndrome 09/11/2018   Venous ulcer ([facility]) 02/02/2017   Varicose veins of bilateral lower extremities with pain 05/16/2016   Chronic venous insufficiency 05/16/2016   Pain in limb 05/16/2016   Swelling of limb 05/16/2016   DJD (degenerative joint disease) 05/16/2016   Past Medical History:  Diagnosis Date   Allergy    Anxiety    Arthritis    Bronchitis    Cancer ([facility])    skin   Fibromyalgia    GERD (gastroesophageal reflux disease)    Hypertension    Osteoarthritis    Pneumonia     Family History  Problem Relation Age of Onset   Breast cancer  Maternal Aunt    Ovarian cancer Sister    Breast cancer Cousin     Past Surgical History:  Procedure Laterality Date   ABDOMINAL HYSTERECTOMY  1996   BREAST BIOPSY Left 2011   stereo done by dr. Lemar Mccarthy -benign   CARPAL TUNNEL RELEASE Right 11/24/2021   Procedure: RIGHT CARPAL TUNNEL RELEASE;  Surgeon: Anna Beards, MD;  Location: [facility];  Service: Orthopedics;  Laterality: Right;   COLONOSCOPY     COLONOSCOPY WITH PROPOFOL N/A 05/30/2016   Procedure: COLONOSCOPY WITH PROPOFOL;  Surgeon: Anna Deem, MD;  Location: [facility] ENDOSCOPY;  Service: Endoscopy;  Laterality: N/A;   DILATION AND CURETTAGE OF UTERUS     x3   HERNIA REPAIR   2012   JOINT REPLACEMENT     KNEE ARTHROSCOPY WITH MEDIAL MENISECTOMY Left 02/18/2016   Procedure: KNEE ARTHROSCOPY WITH PARTIAL MEDIAL MENISECTOMY;  Surgeon: Anna Bucker, MD;  Location: [facility];  Service: Orthopedics;  Laterality: Left;   TOTAL KNEE ARTHROPLASTY Right 08/10/2020   Procedure: RIGHT TOTAL KNEE ARTHROPLASTY;  Surgeon: Anna Kos, MD;  Location: [facility];  Service: Orthopedics;  Laterality: Right;   TOTAL KNEE ARTHROPLASTY Left 11/02/2020   Procedure: LEFT TOTAL KNEE ARTHROPLASTY;  Surgeon: Anna Kos, MD;  Location: [facility];  Service: Orthopedics;  Laterality: Left;   TUBAL LIGATION  1984   VARICOSE VEIN SURGERY Bilateral    Social History   Occupational History   Not on file  Tobacco Use   Smoking status: Never   Smokeless tobacco: Never  Vaping Use   Vaping Use: Never used  Substance and Sexual Activity   Alcohol use: Yes    Comment: once per month   Drug use: No   Sexual activity: Not on file

## 2023-01-25 ENCOUNTER — Telehealth (INDEPENDENT_AMBULATORY_CARE_PROVIDER_SITE_OTHER): Payer: Self-pay

## 2023-01-25 NOTE — Telephone Encounter (Signed)
LVM for PT to call back to schedule sclero appt

## 2023-01-26 ENCOUNTER — Telehealth (INDEPENDENT_AMBULATORY_CARE_PROVIDER_SITE_OTHER): Payer: Self-pay

## 2023-01-26 NOTE — Telephone Encounter (Signed)
2nd attempt to call pt to schedule sclero appts LVM for pt to callback

## 2023-02-04 IMAGING — CR DG CHEST 2V
2 series · 2 of 2 positions shown · non-contrast
Comparison: 01/07/2015

CLINICAL DATA: Right total knee arthroplasty

EXAM:
CHEST - 2 VIEW

[w chest pa]
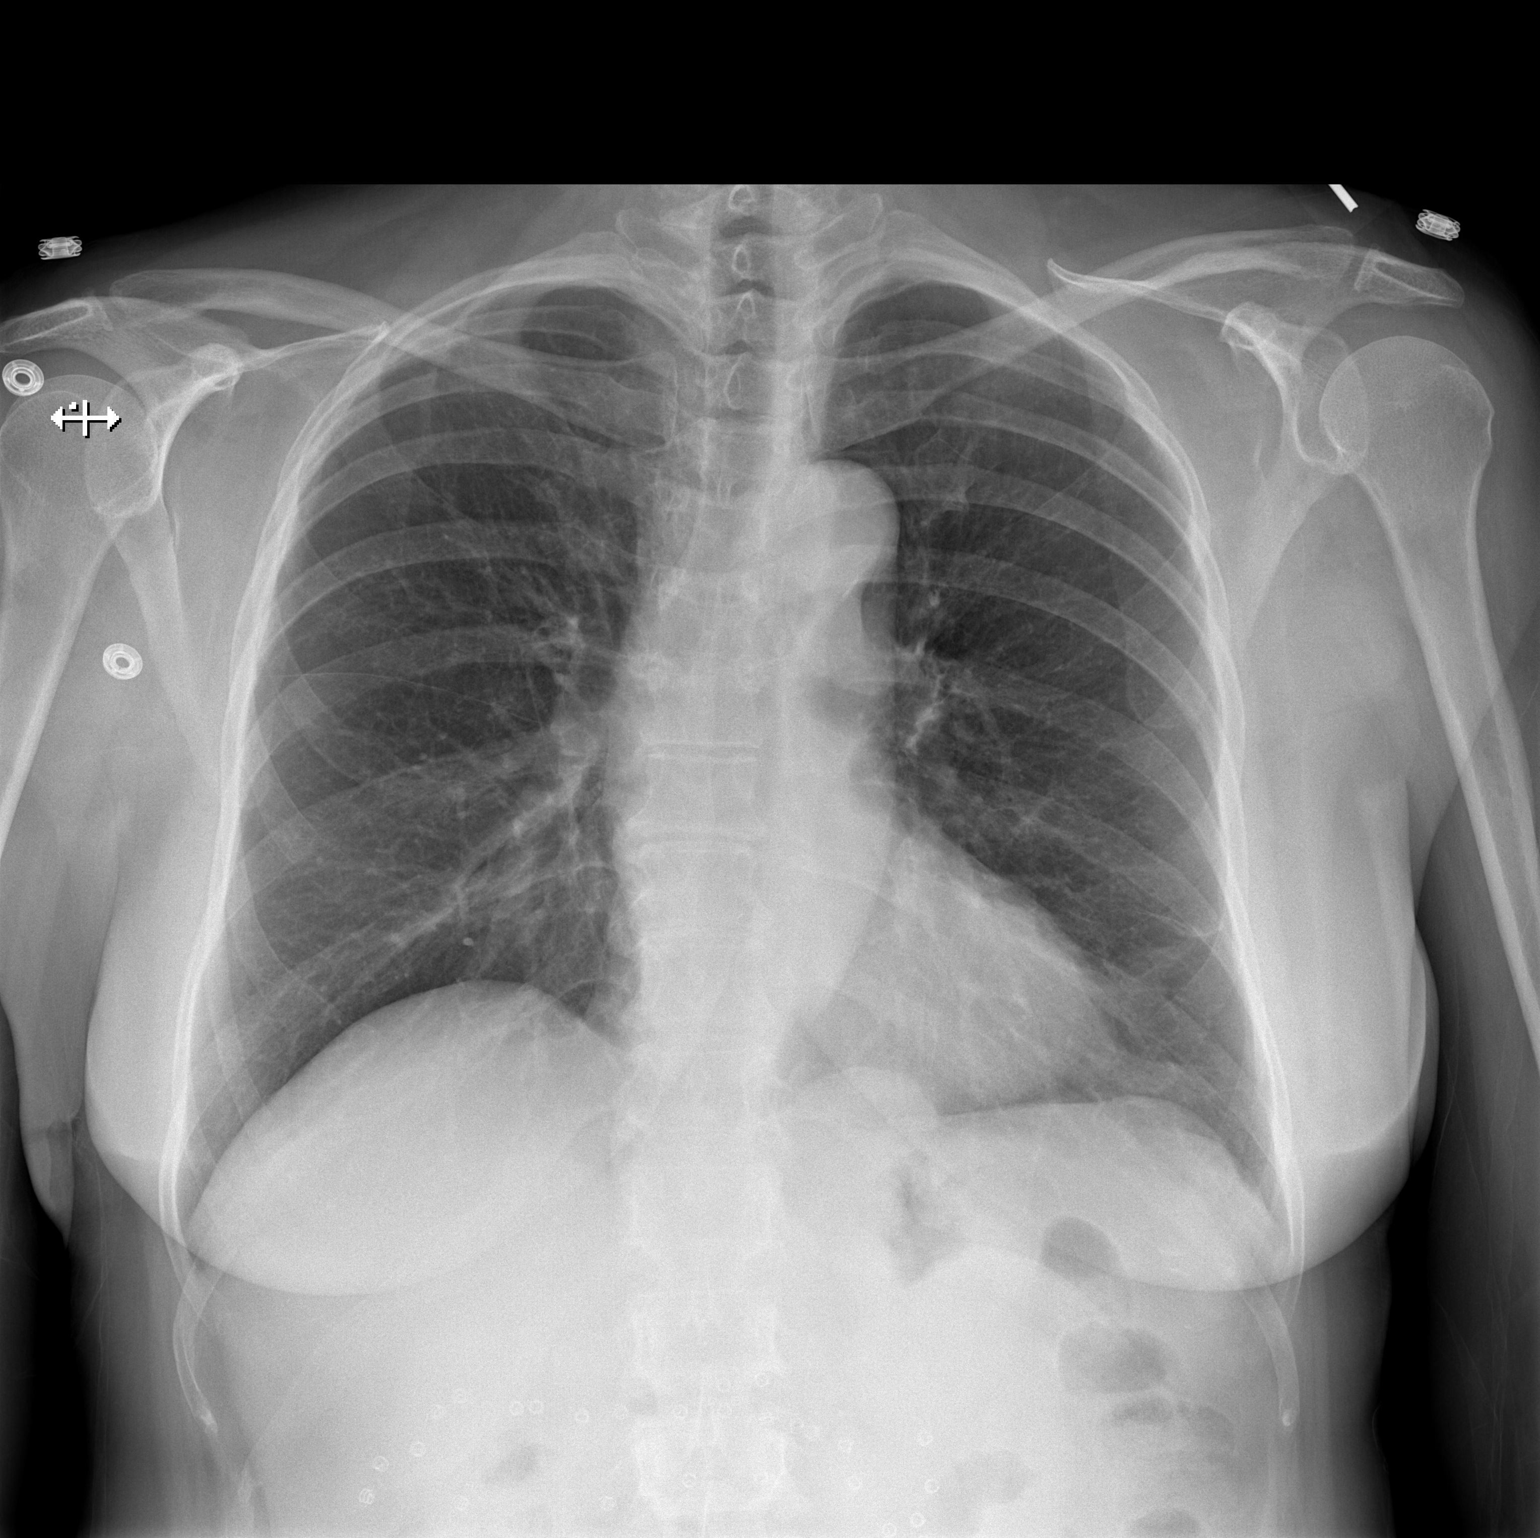

[w chest lat]
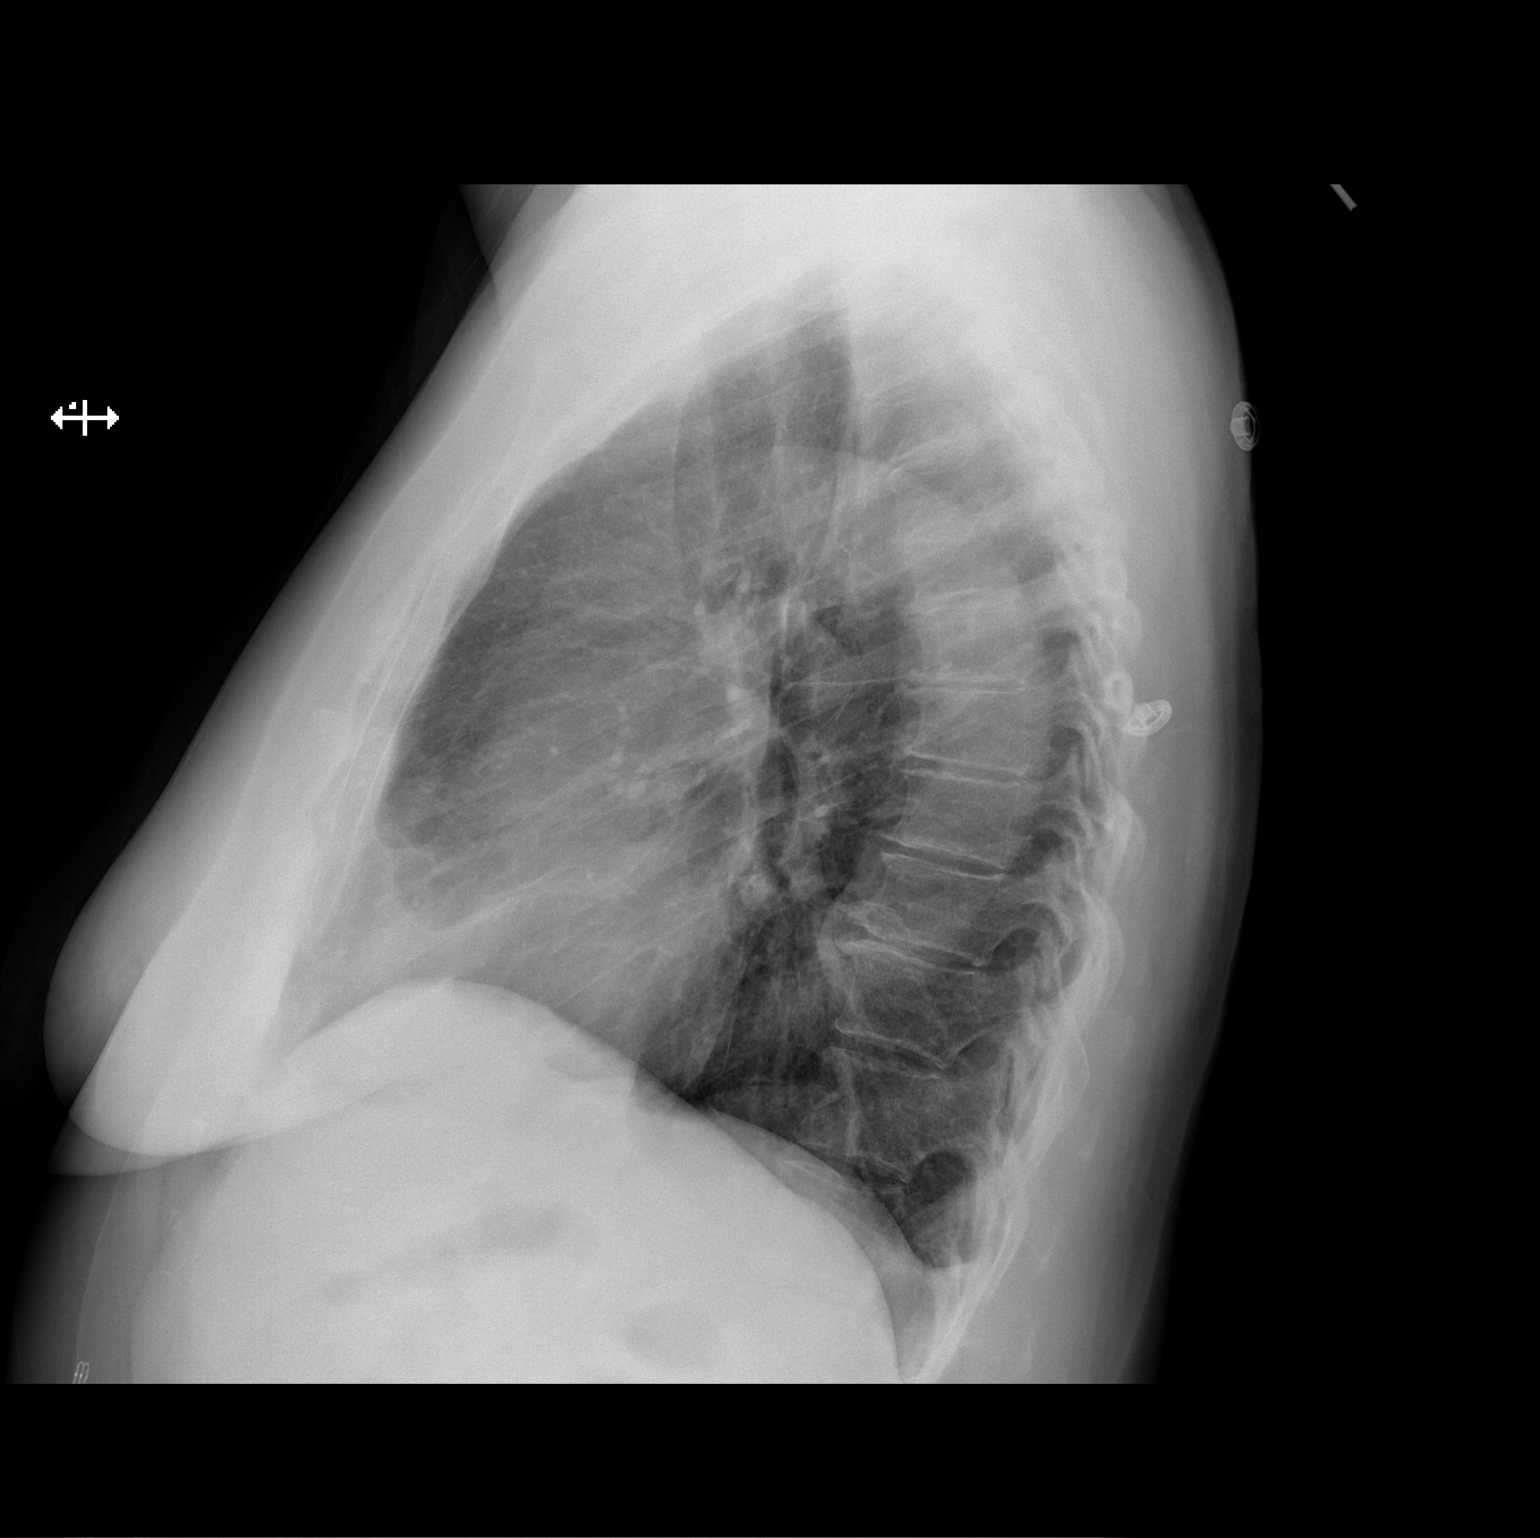

[2 of 2 positions shown; findings below may reference images not displayed]

FINDINGS: Lungs are clear. No pneumothorax or pleural effusion. Rounded
density within the left medial lung base is unchanged from prior
examination and may represent a small hiatal hernia or eventration
of the left hemidiaphragm. Cardiac size within normal limits.
Pulmonary vascularity is normal. Osseous structures are
age-appropriate.
IMPRESSION: No active cardiopulmonary disease.

## 2023-02-07 ENCOUNTER — Telehealth (INDEPENDENT_AMBULATORY_CARE_PROVIDER_SITE_OTHER): Payer: Self-pay

## 2023-02-07 NOTE — Telephone Encounter (Signed)
LVM for pt to call back to get sclero therapy scheduled

## 2023-06-01 ENCOUNTER — Emergency Department
Admission: EM | Admit: 2023-06-01 | Discharge: 2023-06-01 | Disposition: A | Discharge status: Home / Self Care | Payer: Medicare Other | Attending: Student in an Organized Health Care Education/Training Program | Admitting: Student in an Organized Health Care Education/Training Program

## 2023-06-01 ENCOUNTER — Other Ambulatory Visit: Payer: Self-pay

## 2023-06-01 ENCOUNTER — Emergency Department: Payer: Medicare Other

## 2023-06-01 DIAGNOSIS — R0602 Shortness of breath: Secondary | ICD-10-CM | POA: Diagnosis present

## 2023-06-01 LAB — CBC WITH DIFFERENTIAL/PLATELET
Abs Immature Granulocytes: 0.01 10*3/uL (ref 0.00–0.07)
Basophils Absolute: 0.1 10*3/uL (ref 0.0–0.1)
Basophils Relative: 1 %
Eosinophils Absolute: 0.3 10*3/uL (ref 0.0–0.5)
Eosinophils Relative: 5 %
HCT: 33.2 % — ABNORMAL LOW (ref 36.0–46.0)
Hemoglobin: 10.8 g/dL — ABNORMAL LOW (ref 12.0–15.0)
Immature Granulocytes: 0 %
Lymphocytes Relative: 49 %
Lymphs Abs: 2.5 10*3/uL (ref 0.7–4.0)
MCH: 28.5 pg (ref 26.0–34.0)
MCHC: 32.5 g/dL (ref 30.0–36.0)
MCV: 87.6 fL (ref 80.0–100.0)
Monocytes Absolute: 0.3 10*3/uL (ref 0.1–1.0)
Monocytes Relative: 6 %
Neutro Abs: 2 10*3/uL (ref 1.7–7.7)
Neutrophils Relative %: 39 %
Platelets: 382 10*3/uL (ref 150–400)
RBC: 3.79 MIL/uL — ABNORMAL LOW (ref 3.87–5.11)
RDW: 15 % (ref 11.5–15.5)
WBC: 5.2 10*3/uL (ref 4.0–10.5)
nRBC: 0 % (ref 0.0–0.2)

## 2023-06-01 LAB — BASIC METABOLIC PANEL
Anion gap: 13 (ref 5–15)
BUN: 14 mg/dL (ref 8–23)
CO2: 26 mmol/L (ref 22–32)
Calcium: 8.5 mg/dL — ABNORMAL LOW (ref 8.9–10.3)
Chloride: 94 mmol/L — ABNORMAL LOW (ref 98–111)
Creatinine, Ser: 0.77 mg/dL (ref 0.44–1.00)
GFR, Estimated: >60 mL/min (ref >60)
Glucose, Bld: 107 mg/dL — ABNORMAL HIGH (ref 70–99)
Potassium: 3 mmol/L — ABNORMAL LOW (ref 3.5–5.1)
Sodium: 133 mmol/L — ABNORMAL LOW (ref 135–145)

## 2023-06-01 LAB — TROPONIN I (HIGH SENSITIVITY): Troponin I (High Sensitivity): 5 ng/L (ref <18)

## 2023-06-01 MED ORDER — POTASSIUM CHLORIDE CRYS ER 20 MEQ PO TBCR
40.0000 meq | EXTENDED_RELEASE_TABLET | Freq: Once | ORAL | Status: AC
  Administered 2023-06-01: 40 meq via ORAL
  Filled 2023-06-01: qty 2

## 2023-06-01 NOTE — ED Notes (Signed)
D/C, reasons to return and f/up discussed with pt, pt verbalized understanding. Husband with pt during D/C NAD noted. Pt ambulatory with steady gait.

## 2023-06-01 NOTE — ED Triage Notes (Signed)
Pt to ED via EMS from home, pt reports she began to feel short of breath tonight while laying on the couch watching TV. Pt denies chest pain. Hx asthma

## 2023-06-01 NOTE — ED Provider Notes (Signed)
[facility] Provider Note    Event Date/Time   First MD Initiated Contact with Patient 06/01/23 [phone]     (approximate)   History   Shortness of Breath   HPI  Anna Mccarthy is a 67 y.o. female with a history of fibromyalgia anxiety, bronchitis presents to the ER for evaluation of shortness of breath and anxiety throughout the evening.  States she was feeling very anxious and folic she was having a panic attack.  Has been worried about Social Security and a lot of political issues currently.  Did not sleep much.  She now feels improved.  Does not feel like she is having asthma exacerbation or bronchitis.  No pleuritic pain.  No chest pain.  No nausea or vomiting.  She now feels back to her baseline and otherwise feels well.     Physical Exam   Triage Vital Signs: ED Triage Vitals  Encounter Vitals Group     BP 06/01/23 0500 115/75     Systolic BP Percentile --      Diastolic BP Percentile --      Pulse Rate 06/01/23 0500 77     Resp 06/01/23 0500 20     Temp 06/01/23 0500 97.8 F (36.6 C)     Temp Source 06/01/23 0500 Oral     SpO2 06/01/23 0500 98 %     Weight 06/01/23 0501 170 lb (77.1 kg)     Height 06/01/23 0501 5\' 3"  (1.6 m)     Head Circumference --      Peak Flow --      Pain Score 06/01/23 0501 0     Pain Loc --      Pain Education --      Exclude from Growth Chart --     Most recent vital signs: Vitals:   06/01/23 0500  BP: 115/75  Pulse: 77  Resp: 20  Temp: 97.8 F (36.6 C)  SpO2: 98%     Constitutional: Alert  Eyes: Conjunctivae are normal.  Head: Atraumatic. Nose: No congestion/rhinnorhea. Mouth/Throat: Mucous membranes are moist.   Neck: Painless ROM.  Cardiovascular:   Good peripheral circulation. Respiratory: Normal respiratory effort.  No retractions, no wheezing or rhonchi.  Gastrointestinal: Soft and nontender.  Musculoskeletal:  no deformity Neurologic:  MAE spontaneously. No gross focal neurologic deficits  are appreciated.  Skin:  Skin is warm, dry and intact. No rash noted. Psychiatric: Mood and affect are normal. Speech and behavior are normal.    ED Results / Procedures / Treatments   Labs (all labs ordered are listed, but only abnormal results are displayed) Labs Reviewed  CBC WITH DIFFERENTIAL/PLATELET - Abnormal; Notable for the following components:      Result Value   RBC 3.79 (*)    Hemoglobin 10.8 (*)    HCT 33.2 (*)    All other components within normal limits  BASIC METABOLIC PANEL - Abnormal; Notable for the following components:   Sodium 133 (*)    Potassium 3.0 (*)    Chloride 94 (*)    Glucose, Bld 107 (*)    Calcium 8.5 (*)    All other components within normal limits  TROPONIN I (HIGH SENSITIVITY)     EKG  ED ECG REPORT I, Willy Eddy, the attending physician, personally viewed and interpreted this ECG.   Date: 06/01/2023  EKG Time: 5:06  Rate: 70  Rhythm: sinus  Axis: normal  Intervals: normal  ST&T Change: nonspecific st abn, no  stemi    RADIOLOGY Please see ED Course for my review and interpretation.  I personally reviewed all radiographic images ordered to evaluate for the above acute complaints and reviewed radiology reports and findings.  These findings were personally discussed with the patient.  Please see medical record for radiology report.    PROCEDURES:  Critical Care performed: No  Procedures   MEDICATIONS ORDERED IN ED: Medications  potassium chloride SA (KLOR-CON M) CR tablet 40 mEq (40 mEq Oral Given 06/01/23 0718)     IMPRESSION / MDM / ASSESSMENT AND PLAN / ED COURSE  I reviewed the triage vital signs and the nursing notes.                              Differential diagnosis includes, but is not limited to, Asthma, copd, CHF, pna, ptx, malignancy, Pe, anemia  Patient presenting to the ER for evaluation of symptoms as described above.  Based on symptoms, risk factors and considered above differential, this  presenting complaint could reflect a potentially life-threatening illness therefore the patient will be placed on continuous pulse oximetry and telemetry for monitoring.  Laboratory evaluation will be sent to evaluate for the above complaints.  Chest x-ray on my review and interpretation without evidence of infiltrate or consolidation.  Her exam is reassuring and clear.  Patient has a history anxiety and feels like this was anxiety.  She is asymptomatic at this time.  Based on her age we will check troponin.  Her EKG with some nonspecific changes but no evidence of acute ischemia patient denies any chest pain or pressure at this time therefore I have very low suspicion for ACS.    Clinical Course as of 06/01/23 0735  Thu Jun 01, 2023  0732 Patient's troponin is negative.  Have very low clinical suspicion for PE based on her presentation exam stable vital signs lack of hypoxia or signs or symptoms of VTE.  Do not feel that further diagnostic testing clinically indicated.  She does appear stable and appropriate for outpatient follow-up. [PR]    Clinical Course User Index [PR] Willy Eddy, MD     FINAL CLINICAL IMPRESSION(S) / ED DIAGNOSES   Final diagnoses:  SOB (shortness of breath)     Rx / DC Orders   ED Discharge Orders     None        Note:  This document was prepared using Dragon voice recognition software and may include unintentional dictation errors.    Willy Eddy, MD 06/01/23 [phone]

## 2023-06-14 ENCOUNTER — Other Ambulatory Visit: Payer: Self-pay | Admitting: Family Medicine

## 2023-06-14 DIAGNOSIS — Z1231 Encounter for screening mammogram for malignant neoplasm of breast: Secondary | ICD-10-CM

## 2023-07-10 ENCOUNTER — Ambulatory Visit
Admission: RE | Admit: 2023-07-10 | Discharge: 2023-07-10 | Disposition: A | Discharge status: Home / Self Care | Payer: Medicare Other | Source: Ambulatory Visit | Attending: Family Medicine | Admitting: Family Medicine

## 2023-07-10 DIAGNOSIS — Z1231 Encounter for screening mammogram for malignant neoplasm of breast: Secondary | ICD-10-CM | POA: Insufficient documentation

## 2023-07-20 ENCOUNTER — Encounter: Payer: Self-pay | Admitting: Podiatry

## 2023-07-24 ENCOUNTER — Encounter: Payer: Self-pay | Admitting: Anesthesiology

## 2023-08-01 NOTE — Discharge Instructions (Signed)

## 2023-08-02 ENCOUNTER — Encounter
Admission: RE | Disposition: A | Discharge status: Home / Self Care | Payer: Self-pay | Source: Home / Self Care | Attending: Podiatry

## 2023-08-02 ENCOUNTER — Encounter: Payer: Self-pay | Admitting: Podiatry

## 2023-08-02 ENCOUNTER — Other Ambulatory Visit: Payer: Self-pay

## 2023-08-02 ENCOUNTER — Ambulatory Visit
Admission: RE | Admit: 2023-08-02 | Discharge: 2023-08-02 | Disposition: A | Discharge status: Home / Self Care | Payer: Medicare Other | Attending: Podiatry | Admitting: Podiatry

## 2023-08-02 DIAGNOSIS — Z01812 Encounter for preprocedural laboratory examination: Secondary | ICD-10-CM

## 2023-08-02 DIAGNOSIS — Z539 Procedure and treatment not carried out, unspecified reason: Secondary | ICD-10-CM | POA: Diagnosis present

## 2023-08-02 DIAGNOSIS — M79671 Pain in right foot: Secondary | ICD-10-CM | POA: Diagnosis not present

## 2023-08-02 DIAGNOSIS — Z79899 Other long term (current) drug therapy: Secondary | ICD-10-CM

## 2023-08-02 DIAGNOSIS — M2011 Hallux valgus (acquired), right foot: Secondary | ICD-10-CM | POA: Insufficient documentation

## 2023-08-02 HISTORY — DX: Dizziness and giddiness: R42

## 2023-08-02 SURGERY — BUNIONECTOMY
Anesthesia: Choice | Site: Toe | Laterality: Right

## 2023-08-02 MED ORDER — CEFAZOLIN SODIUM-DEXTROSE 2-3 GM-%(50ML) IV SOLR
INTRAVENOUS | Status: AC
  Filled 2023-08-02: qty 50

## 2023-08-02 MED ORDER — CEFAZOLIN SODIUM-DEXTROSE 2-4 GM/100ML-% IV SOLN: 2.0000 g | INTRAVENOUS | Status: DC

## 2023-08-02 SURGICAL SUPPLY — 37 items
BENZOIN TINCTURE PRP APPL 2/3 (GAUZE/BANDAGES/DRESSINGS) ×1
BLADE MED AGGRESSIVE (BLADE)
BLADE OSC/SAGITTAL MD 5.5X18 (BLADE)
BLADE SURG 15 STRL LF DISP TIS (BLADE)
BNDG COHESIVE 4X5 TAN STRL LF (GAUZE/BANDAGES/DRESSINGS) ×1
BNDG ELASTIC 4X5.8 VLCR NS LF (GAUZE/BANDAGES/DRESSINGS) ×1
BNDG ESMARCH 4X12 STRL LF (GAUZE/BANDAGES/DRESSINGS) ×1
BNDG GAUZE DERMACEA FLUFF 4 (GAUZE/BANDAGES/DRESSINGS) ×1
BNDG STRETCH 4X75 STRL LF (GAUZE/BANDAGES/DRESSINGS) ×1
BOOT STEPPER DURA LG (SOFTGOODS)
BOOT STEPPER DURA MED (SOFTGOODS)
BOOT STEPPER DURA SM (SOFTGOODS)
CANISTER SUCT 1200ML W/VALVE (MISCELLANEOUS) ×1
COVER LIGHT HANDLE UNIVERSAL (MISCELLANEOUS) ×2
CUFF TOURN SGL QUICK 18X4 (TOURNIQUET CUFF)
DRAPE FLUOR MINI C-ARM 54X84 (DRAPES) ×1
DURAPREP 26ML APPLICATOR (WOUND CARE) ×1
ELECT REM PT RETURN 9FT ADLT (ELECTROSURGICAL) ×1
GAUZE SPONGE 4X4 12PLY STRL (GAUZE/BANDAGES/DRESSINGS) ×1
GAUZE XEROFORM 1X8 LF (GAUZE/BANDAGES/DRESSINGS) ×1
GLOVE BIOGEL PI IND STRL 8 (GLOVE) ×2
GLOVE SURG SS PI 7.5 STRL IVOR (GLOVE) ×2
GOWN SPEC L4 XLG W/TWL (GOWN DISPOSABLE) ×1
GOWN STRL REUS W/ TWL LRG LVL3 (GOWN DISPOSABLE) ×1
K-WIRE DBL END TROCAR 6X.045 (WIRE)
K-WIRE DBL END TROCAR 6X.062 (WIRE)
KIT TURNOVER KIT A (KITS) ×1
NS IRRIG 500ML POUR BTL (IV SOLUTION) ×1
PACK EXTREMITY ARMC (MISCELLANEOUS) ×1
RASP SM TEAR CROSS CUT (RASP)
STOCKINETTE IMPERVIOUS LG (DRAPES) ×1
STRIP CLOSURE SKIN 1/4X4 (GAUZE/BANDAGES/DRESSINGS) ×1
SUT MNCRL 4-0 27 PS-2 XMFL (SUTURE) ×1
SUT MNCRL+ 5-0 UNDYED PC-3 (SUTURE)
SUT VIC AB 2-0 SH 27XBRD (SUTURE)
SUT VIC AB 3-0 SH 27X BRD (SUTURE)
SUT VIC AB 4-0 SH 27XANBCTRL (SUTURE)

## 2023-08-02 NOTE — Progress Notes (Signed)
 Procedure canceled per anesthesia. Pt had coffee with creamer

## 2023-08-03 ENCOUNTER — Other Ambulatory Visit: Payer: Self-pay | Admitting: Podiatry

## 2023-08-08 ENCOUNTER — Other Ambulatory Visit: Payer: Self-pay

## 2023-08-08 ENCOUNTER — Encounter
Admission: RE | Admit: 2023-08-08 | Discharge: 2023-08-08 | Disposition: A | Discharge status: Home / Self Care | Payer: Medicare Other | Source: Ambulatory Visit | Attending: Podiatry | Admitting: Podiatry

## 2023-08-08 HISTORY — DX: Essential (primary) hypertension: I10

## 2023-08-08 HISTORY — DX: Irritable bowel syndrome, unspecified: K58.9

## 2023-08-08 HISTORY — DX: Hyperlipidemia, unspecified: E78.5

## 2023-08-08 HISTORY — DX: Varicose veins of bilateral lower extremities with pain: I83.813

## 2023-08-08 HISTORY — DX: Diverticulosis of intestine, part unspecified, without perforation or abscess without bleeding: K57.90

## 2023-08-08 HISTORY — DX: Pure hypercholesterolemia, unspecified: E78.00

## 2023-08-08 HISTORY — DX: Vitamin D deficiency, unspecified: E55.9

## 2023-08-08 HISTORY — DX: Age-related osteoporosis without current pathological fracture: M81.0

## 2023-08-08 HISTORY — DX: Arthropathic psoriasis, unspecified: L40.50

## 2023-08-08 HISTORY — DX: Venous insufficiency (chronic) (peripheral): I87.2

## 2023-08-08 NOTE — Patient Instructions (Addendum)
Your procedure is scheduled on: Friday, February 14 Report to the Registration Desk on the 1st floor of the [facility] To find out your arrival time, please call [phone] between 1PM - 3PM on: Thursday, February 13 If your arrival time is 6:00 am, do not arrive before that time as the [facility] entrance doors do not open until 6:00 am.  REMEMBER: Instructions that are not followed completely may result in serious medical risk, up to and including death; or upon the discretion of your surgeon and anesthesiologist your surgery may need to be rescheduled.  Do not eat food after midnight the night before surgery.  No gum chewing or hard candies.  You may however, drink CLEAR liquids up to 2 hours before you are scheduled to arrive for your surgery. Do not drink anything within 2 hours of your scheduled arrival time.  Clear liquids include: - water  - apple juice without pulp - gatorade (not RED colors) - black coffee or tea (Do NOT add milk or creamers to the coffee or tea) Do NOT drink anything that is not on this list.  In addition, your doctor has ordered for you to drink the provided:  Ensure Pre-Surgery Clear Carbohydrate Drink  Drinking this carbohydrate drink up to two hours before surgery helps to reduce insulin resistance and improve patient outcomes. Please complete drinking 2 hours before scheduled arrival time.  One week prior to surgery: Stop Anti-inflammatories (NSAIDS) such as Advil, Aleve, Ibuprofen, Motrin, Naproxen, Naprosyn and Aspirin based products such as Excedrin, Goody's Powder, BC Powder. Stop ANY OVER THE COUNTER supplements until after surgery.  You may however, continue to take Tylenol if needed for pain up until the day of surgery.  Do NOT take repatha or mexotrexate until after surgery.  Continue taking all of your other prescription medications up until the day of surgery.  ON THE DAY OF SURGERY ONLY TAKE THESE MEDICATIONS WITH SIPS OF  WATER:  Duloxetine (Cymbalta) Gabapentin Omeprazole (Prilosec)  No Alcohol for 24 hours before or after surgery.  No Smoking including e-cigarettes for 24 hours before surgery.  No chewable tobacco products for at least 6 hours before surgery.  No nicotine patches on the day of surgery.  Do not use any "recreational" drugs for at least a week (preferably 2 weeks) before your surgery.  Please be advised that the combination of cocaine and anesthesia may have negative outcomes, up to and including death. If you test positive for cocaine, your surgery will be cancelled.  On the morning of surgery brush your teeth with toothpaste and water, you may rinse your mouth with mouthwash if you wish. Do not swallow any toothpaste or mouthwash.  Use CHG Soap as directed on instruction sheet.  Do not wear jewelry, make-up, hairpins, clips or nail polish.  For welded (permanent) jewelry: bracelets, anklets, waist bands, etc.  Please have this removed prior to surgery.  If it is not removed, there is a chance that hospital personnel will need to cut it off on the day of surgery.  Do not wear lotions, powders, or perfumes.   Do not shave body hair from the neck down 48 hours before surgery.  Contact lenses, hearing aids and dentures may not be worn into surgery.  Do not bring valuables to the hospital. [facility] is not responsible for any missing/lost belongings or valuables.   Notify your doctor if there is any change in your medical condition (cold, fever, infection).  Wear comfortable clothing (specific  to your surgery type) to the hospital.  After surgery, you can help prevent lung complications by doing breathing exercises.  Take deep breaths and cough every 1-2 hours. Your doctor may order a device called an Incentive Spirometer to help you take deep breaths.  If you are being discharged the day of surgery, you will not be allowed to drive home. You will need a responsible individual  to drive you home and stay with you for 24 hours after surgery.   If you are taking public transportation, you will need to have a responsible individual with you.  Please call the Pre-admissions Testing Dept. at [phone] if you have any questions about these instructions.  Surgery Visitation Policy:  Patients having surgery or a procedure may have two visitors.  Children under the age of 63 must have an adult with them who is not the patient.  Temporary Visitor Restrictions Due to increasing cases of flu, RSV and COVID-19: Children ages 16 and under will not be able to visit patients in [facility] hospitals under most circumstances.      Preparing for Surgery with CHLORHEXIDINE GLUCONATE (CHG) Soap  Chlorhexidine Gluconate (CHG) Soap  o An antiseptic cleaner that kills germs and bonds with the skin to continue killing germs even after washing  o Used for showering the night before surgery and morning of surgery  Before surgery, you can play an important role by reducing the number of germs on your skin.  CHG (Chlorhexidine gluconate) soap is an antiseptic cleanser which kills germs and bonds with the skin to continue killing germs even after washing.  Please do not use if you have an allergy to CHG or antibacterial soaps. If your skin becomes reddened/irritated stop using the CHG.  1. Shower the NIGHT BEFORE SURGERY and the MORNING OF SURGERY with CHG soap.  2. If you choose to wash your hair, wash your hair first as usual with your normal shampoo.  3. After shampooing, rinse your hair and body thoroughly to remove the shampoo.  4. Use CHG as you would any other liquid soap. You can apply CHG directly to the skin and wash gently with a scrungie or a clean washcloth.  5. Apply the CHG soap to your body only from the neck down. Do not use on open wounds or open sores. Avoid contact with your eyes, ears, mouth, and genitals (private parts). Wash face and genitals (private  parts) with your normal soap.  6. Wash thoroughly, paying special attention to the area where your surgery will be performed.  7. Thoroughly rinse your body with warm water.  8. Do not shower/wash with your normal soap after using and rinsing off the CHG soap.  9. Pat yourself dry with a clean towel.  10. Wear clean pajamas to bed the night before surgery.  12. Place clean sheets on your bed the night of your first shower and do not sleep with pets.  13. Shower again with the CHG soap on the day of surgery prior to arriving at the hospital.  14. Do not apply any deodorants/lotions/powders.  15. Please wear clean clothes to the hospital.

## 2023-08-11 ENCOUNTER — Other Ambulatory Visit: Payer: Self-pay

## 2023-08-11 ENCOUNTER — Ambulatory Visit: Payer: Medicare Other | Admitting: Anesthesiology

## 2023-08-11 ENCOUNTER — Encounter: Payer: Self-pay | Admitting: Podiatry

## 2023-08-11 ENCOUNTER — Ambulatory Visit
Admission: RE | Admit: 2023-08-11 | Discharge: 2023-08-11 | Disposition: A | Discharge status: Home / Self Care | Payer: Medicare Other | Attending: Podiatry | Admitting: Podiatry

## 2023-08-11 ENCOUNTER — Ambulatory Visit: Payer: Medicare Other

## 2023-08-11 ENCOUNTER — Encounter
Admission: RE | Disposition: A | Discharge status: Home / Self Care | Payer: Self-pay | Source: Home / Self Care | Attending: Podiatry

## 2023-08-11 DIAGNOSIS — I10 Essential (primary) hypertension: Secondary | ICD-10-CM | POA: Insufficient documentation

## 2023-08-11 DIAGNOSIS — M2011 Hallux valgus (acquired), right foot: Secondary | ICD-10-CM | POA: Insufficient documentation

## 2023-08-11 DIAGNOSIS — Z79899 Other long term (current) drug therapy: Secondary | ICD-10-CM | POA: Diagnosis not present

## 2023-08-11 HISTORY — PX: AIKEN OSTEOTOMY: SHX6331

## 2023-08-11 HISTORY — PX: BUNIONECTOMY: SHX129

## 2023-08-11 SURGERY — BUNIONECTOMY
Anesthesia: General | Site: Toe | Laterality: Right

## 2023-08-11 MED ORDER — PROPOFOL 10 MG/ML IV BOLUS
INTRAVENOUS | Status: AC
  Filled 2023-08-11: qty 20

## 2023-08-11 MED ORDER — BUPIVACAINE-EPINEPHRINE (PF) 0.25% -1:200000 IJ SOLN
INTRAMUSCULAR | Status: AC
  Filled 2023-08-11: qty 30

## 2023-08-11 MED ORDER — ASPIRIN 81 MG PO TBEC
81.0000 mg | DELAYED_RELEASE_TABLET | Freq: Every day | ORAL | 12 refills | Status: AC
Start: 2023-08-11 — End: ?

## 2023-08-11 MED ORDER — ACETAMINOPHEN 10 MG/ML IV SOLN
INTRAVENOUS | Status: DC | PRN
  Administered 2023-08-11: 1000 mg via INTRAVENOUS

## 2023-08-11 MED ORDER — BUPIVACAINE LIPOSOME 1.3 % IJ SUSP
INTRAMUSCULAR | Status: AC
  Filled 2023-08-11: qty 10

## 2023-08-11 MED ORDER — BUPIVACAINE HCL (PF) 0.25 % IJ SOLN
INTRAMUSCULAR | Status: DC | PRN
  Administered 2023-08-11: 10 mL

## 2023-08-11 MED ORDER — LACTATED RINGERS IV SOLN
INTRAVENOUS | Status: DC
Start: 2023-08-11 — End: 2023-08-11

## 2023-08-11 MED ORDER — FENTANYL CITRATE (PF) 100 MCG/2ML IJ SOLN
INTRAMUSCULAR | Status: DC | PRN
  Administered 2023-08-11 (×2): 50 ug via INTRAVENOUS

## 2023-08-11 MED ORDER — ONDANSETRON HCL 4 MG/2ML IJ SOLN
INTRAMUSCULAR | Status: DC | PRN
  Administered 2023-08-11: 4 mg via INTRAVENOUS

## 2023-08-11 MED ORDER — CHLORHEXIDINE GLUCONATE 0.12 % MT SOLN
OROMUCOSAL | Status: AC
  Filled 2023-08-11: qty 15

## 2023-08-11 MED ORDER — PHENYLEPHRINE 80 MCG/ML (10ML) SYRINGE FOR IV PUSH (FOR BLOOD PRESSURE SUPPORT)
PREFILLED_SYRINGE | INTRAVENOUS | Status: DC | PRN
  Administered 2023-08-11: 40 ug via INTRAVENOUS

## 2023-08-11 MED ORDER — CEFAZOLIN SODIUM-DEXTROSE 2-4 GM/100ML-% IV SOLN
INTRAVENOUS | Status: AC
  Filled 2023-08-11: qty 100

## 2023-08-11 MED ORDER — FENTANYL CITRATE (PF) 100 MCG/2ML IJ SOLN
INTRAMUSCULAR | Status: AC
Start: 2023-08-11 — End: ?
  Filled 2023-08-11: qty 2

## 2023-08-11 MED ORDER — HYDROCODONE-ACETAMINOPHEN 5-325 MG PO TABS
1.0000 | ORAL_TABLET | Freq: Four times a day (QID) | ORAL | 0 refills | Status: AC | PRN
Start: 2023-08-11 — End: ?

## 2023-08-11 MED ORDER — ORAL CARE MOUTH RINSE: 15.0000 mL | Freq: Once | OROMUCOSAL | Status: AC

## 2023-08-11 MED ORDER — CHLORHEXIDINE GLUCONATE 0.12 % MT SOLN
15.0000 mL | Freq: Once | OROMUCOSAL | Status: AC
  Administered 2023-08-11: 15 mL via OROMUCOSAL

## 2023-08-11 MED ORDER — MIDAZOLAM HCL 2 MG/2ML IJ SOLN
INTRAMUSCULAR | Status: DC | PRN
  Administered 2023-08-11: 2 mg via INTRAVENOUS

## 2023-08-11 MED ORDER — LIDOCAINE HCL (CARDIAC) PF 100 MG/5ML IV SOSY
PREFILLED_SYRINGE | INTRAVENOUS | Status: DC | PRN
  Administered 2023-08-11: 60 mg via INTRAVENOUS

## 2023-08-11 MED ORDER — MIDAZOLAM HCL 2 MG/2ML IJ SOLN
INTRAMUSCULAR | Status: AC
  Filled 2023-08-11: qty 2

## 2023-08-11 MED ORDER — BUPIVACAINE LIPOSOME 1.3 % IJ SUSP
INTRAMUSCULAR | Status: DC | PRN
  Administered 2023-08-11: 10 mL

## 2023-08-11 MED ORDER — 0.9 % SODIUM CHLORIDE (POUR BTL) OPTIME
TOPICAL | Status: DC | PRN
  Administered 2023-08-11: 500 mL

## 2023-08-11 MED ORDER — CEFAZOLIN SODIUM-DEXTROSE 2-4 GM/100ML-% IV SOLN
2.0000 g | INTRAVENOUS | Status: AC
  Administered 2023-08-11: 2 g via INTRAVENOUS

## 2023-08-11 MED ORDER — PROPOFOL 10 MG/ML IV BOLUS
INTRAVENOUS | Status: DC | PRN
  Administered 2023-08-11: 150 mg via INTRAVENOUS

## 2023-08-11 MED ORDER — DEXAMETHASONE SODIUM PHOSPHATE 10 MG/ML IJ SOLN
INTRAMUSCULAR | Status: DC | PRN
  Administered 2023-08-11: 10 mg via INTRAVENOUS

## 2023-08-11 SURGICAL SUPPLY — 52 items
BLADE MED AGGRESSIVE (BLADE) ×1 IMPLANT
BLADE OSC/SAGITTAL MD 9X18.5 (BLADE) IMPLANT
BLADE SAW LAPIPLASTY 40X11 (BLADE) IMPLANT
BLADE SURG 15 STRL LF DISP TIS (BLADE) ×2 IMPLANT
BNDG ELASTIC 4X5.8 VLCR NS LF (GAUZE/BANDAGES/DRESSINGS) ×2 IMPLANT
BNDG GAUZE DERMACEA FLUFF 4 (GAUZE/BANDAGES/DRESSINGS) ×1 IMPLANT
BNDG STRETCH GAUZE 3IN X12FT (GAUZE/BANDAGES/DRESSINGS) ×1 IMPLANT
BOOT STEPPER DURA SM (SOFTGOODS) IMPLANT
BUR 4X45 EGG (BURR) ×1 IMPLANT
CLIP FIXATION STAPLE 10X10X10 (Staple) IMPLANT
COVER PIN YLW 0.028-062 (MISCELLANEOUS) ×1 IMPLANT
CUFF TOURN SGL QUICK 12 (TOURNIQUET CUFF) IMPLANT
CUFF TOURN SGL QUICK 18X4 (TOURNIQUET CUFF) IMPLANT
DRAPE FLUOR MINI C-ARM 54X84 (DRAPES) ×1 IMPLANT
DURAPREP 26ML APPLICATOR (WOUND CARE) ×1 IMPLANT
ELECT REM PT RETURN 9FT ADLT (ELECTROSURGICAL) ×1 IMPLANT
ELECTRODE REM PT RTRN 9FT ADLT (ELECTROSURGICAL) ×1 IMPLANT
GAUZE SPONGE 4X4 12PLY STRL (GAUZE/BANDAGES/DRESSINGS) ×1 IMPLANT
GAUZE STRETCH 2X75IN STRL (MISCELLANEOUS) ×1 IMPLANT
GAUZE XEROFORM 1X8 LF (GAUZE/BANDAGES/DRESSINGS) ×1 IMPLANT
GLOVE BIO SURGEON STRL SZ7.5 (GLOVE) ×1 IMPLANT
GLOVE BIOGEL PI IND STRL 8 (GLOVE) IMPLANT
GLOVE INDICATOR 8.0 STRL GRN (GLOVE) ×1 IMPLANT
GOWN STRL REUS W/ TWL XL LVL3 (GOWN DISPOSABLE) ×2 IMPLANT
KIT PROCEDURE DRILL (DRILL) IMPLANT
LABEL OR SOLS (LABEL) ×1 IMPLANT
LAPIPLASTY SYS 4A (Orthopedic Implant) ×1 IMPLANT
MANIFOLD NEPTUNE II (INSTRUMENTS) ×1 IMPLANT
NDL FILTER BLUNT 18X1 1/2 (NEEDLE) ×1 IMPLANT
NDL HYPO 25X1 1.5 SAFETY (NEEDLE) ×2 IMPLANT
NEEDLE FILTER BLUNT 18X1 1/2 (NEEDLE) ×1 IMPLANT
NEEDLE HYPO 25X1 1.5 SAFETY (NEEDLE) ×2 IMPLANT
NS IRRIG 500ML POUR BTL (IV SOLUTION) ×1 IMPLANT
PACK EXTREMITY ARMC (MISCELLANEOUS) ×1 IMPLANT
RASP SM TEAR CROSS CUT (RASP) ×1 IMPLANT
SCREW 2.7 HIGH PITCH LOCKING (Screw) IMPLANT
SCREW HIGH PITCH LOCK 2.7 (Screw) IMPLANT
SPLINT CAST 1 STEP 4X30 (MISCELLANEOUS) ×1 IMPLANT
SPLINT PLASTER CAST FAST 5X30 (CAST SUPPLIES) ×1 IMPLANT
STOCKINETTE M/LG 89821 (MISCELLANEOUS) ×1 IMPLANT
STRAP SAFETY 5IN WIDE (MISCELLANEOUS) ×1 IMPLANT
STRIP CLOSURE SKIN 1/4X4 (GAUZE/BANDAGES/DRESSINGS) ×1 IMPLANT
SUT MNCRL AB 4-0 PS2 18 (SUTURE) IMPLANT
SUT VIC AB 3-0 SH 27X BRD (SUTURE) IMPLANT
SUT VIC AB 4-0 FS2 27 (SUTURE) ×1 IMPLANT
SUT VIC AB 4-0 SH 27XANBCTRL (SUTURE) IMPLANT
SYR 10ML LL (SYRINGE) ×1 IMPLANT
SYSTEM LAPIPLASTY 4A (Orthopedic Implant) IMPLANT
TRAP FLUID SMOKE EVACUATOR (MISCELLANEOUS) ×1 IMPLANT
WATER STERILE IRR 500ML POUR (IV SOLUTION) ×1 IMPLANT
WIRE Z .045 C-WIRE SPADE TIP (WIRE) ×2 IMPLANT
WIRE Z .062 C-WIRE SPADE TIP (WIRE) ×2 IMPLANT

## 2023-08-11 NOTE — Anesthesia Postprocedure Evaluation (Signed)
Anesthesia Post Note  Patient: Anna Mccarthy  Procedure(s) Performed: Dorena Dew TYPE (Right: Toe) [id number] Quintella Reichert OSTEOTOMY (Right: Toe)  Patient location during evaluation: PACU Anesthesia Type: General Level of consciousness: awake and alert Pain management: pain level controlled Vital Signs Assessment: post-procedure vital signs reviewed and stable Respiratory status: spontaneous breathing, nonlabored ventilation, respiratory function stable and patient connected to nasal cannula oxygen Cardiovascular status: blood pressure returned to baseline and stable Postop Assessment: no apparent nausea or vomiting Anesthetic complications: no   No notable events documented.   Last Vitals:  Vitals:   08/11/23 0930 08/11/23 0945  BP: 136/76 (!) 144/65  Pulse: 85 78  Resp: 11 11  Temp:    SpO2: 98% 98%    Last Pain:  Vitals:   08/11/23 0945  TempSrc:   PainSc: 0-No pain                 Corinda Gubler

## 2023-08-11 NOTE — Anesthesia Procedure Notes (Signed)
Procedure Name: LMA Insertion Date/Time: 08/11/2023 7:38 AM  Performed by: Cheral Bay, CRNAPre-anesthesia Checklist: Patient identified, Emergency Drugs available, Suction available and Patient being monitored Patient Re-evaluated:Patient Re-evaluated prior to induction Oxygen Delivery Method: Circle system utilized Preoxygenation: Pre-oxygenation with 100% oxygen Induction Type: IV induction Ventilation: Mask ventilation without difficulty LMA: LMA inserted LMA Size: 4.0 Tube type: Oral Number of attempts: 1 Placement Confirmation: positive ETCO2 and breath sounds checked- equal and bilateral Tube secured with: Tape Dental Injury: Teeth and Oropharynx as per pre-operative assessment

## 2023-08-11 NOTE — Discharge Instructions (Signed)
[facility]  POST OPERATIVE INSTRUCTIONS FOR DR. Ether Griffins AND DR. [facility] CLINIC PODIATRY DEPARTMENT   Take your medication as prescribed.  Pain medication should be taken only as needed. Take and aspirin until the boot is removed.  Keep the dressing clean, dry and intact.  Keep your foot elevated above the heart level for the first 48 hours.  We have instructed you to be non-weight bearing.  Always wear your post-op shoe when walking.  Always use your crutches if you are to be non-weight bearing.  Do not take a shower. Baths are permissible as long as the foot is kept out of the water.   Every hour you are awake:  Bend your knee 15 times.   Call [facility] [phone]) if any of the following problems occur: You develop a temperature or fever. The bandage becomes saturated with blood. Medication does not stop your pain. Injury of the foot occurs. Any symptoms of infection including redness, odor, or red streaks running from wound.

## 2023-08-11 NOTE — Anesthesia Preprocedure Evaluation (Signed)
Anesthesia Evaluation  Patient identified by MRN, date of birth, ID band Patient awake    Reviewed: Allergy & Precautions, NPO status , Patient's Chart, lab work & pertinent test results  History of Anesthesia Complications Negative for: history of anesthetic complications  Airway Mallampati: II  TM Distance: >3 FB Neck ROM: Full    Dental no notable dental hx. (+) Teeth Intact   Pulmonary neg pulmonary ROS, neg sleep apnea, neg COPD, Patient abstained from smoking.Not current smoker, former smoker   Pulmonary exam normal breath sounds clear to auscultation       Cardiovascular Exercise Tolerance: Good METShypertension, Pt. on medications (-) CAD and (-) Past MI (-) dysrhythmias  Rhythm:Regular Rate:Normal - Systolic murmurs    Neuro/Psych  PSYCHIATRIC DISORDERS Anxiety     negative neurological ROS     GI/Hepatic ,GERD  Controlled,,(+)     (-) substance abuse    Endo/Other  neg diabetes    Renal/GU negative Renal ROS     Musculoskeletal  (+)  Fibromyalgia -  Abdominal   Peds  Hematology   Anesthesia Other Findings Past Medical History: No date: Allergy No date: Anxiety No date: Arthritis No date: Bronchitis No date: Cancer (HCC)     Comment:  skin right leg No date: Chronic venous insufficiency No date: Diverticulosis No date: Essential hypertension No date: Fibromyalgia No date: GERD (gastroesophageal reflux disease) No date: Hypercholesterolemia No date: Hyperlipidemia No date: IBS (irritable bowel syndrome) No date: Osteoarthritis No date: Osteoporosis, post-menopausal No date: Pneumonia No date: Psoriatic arthritis (HCC) No date: Varicose veins of bilateral lower extremities with pain No date: Vertigo     Comment:  none in several years No date: Vitamin D deficiency  Reproductive/Obstetrics                             Anesthesia Physical Anesthesia Plan  ASA:  2  Anesthesia Plan: General   Post-op Pain Management: Ofirmev IV (intra-op)* and Toradol IV (intra-op)*   Induction: Intravenous  PONV Risk Score and Plan: 3 and Ondansetron, Dexamethasone and Midazolam  Airway Management Planned: LMA  Additional Equipment: None  Intra-op Plan:   Post-operative Plan: Extubation in OR  Informed Consent: I have reviewed the patients History and Physical, chart, labs and discussed the procedure including the risks, benefits and alternatives for the proposed anesthesia with the patient or authorized representative who has indicated his/her understanding and acceptance.     Dental advisory given  Plan Discussed with: CRNA and Surgeon  Anesthesia Plan Comments: (Discussed risks of anesthesia with patient, including PONV, sore throat, lip/dental/eye damage. Rare risks discussed as well, such as cardiorespiratory and neurological sequelae, and allergic reactions. Discussed the role of CRNA in patient's perioperative care. Patient understands.)       Anesthesia Quick Evaluation

## 2023-08-11 NOTE — Transfer of Care (Signed)
Immediate Anesthesia Transfer of Care Note  Patient: Anna Mccarthy  Procedure(s) Performed: Dorena Dew TYPE (Right: Toe) [id number] Quintella Reichert OSTEOTOMY (Right: Toe)  Patient Location: PACU  Anesthesia Type:General  Level of Consciousness: awake  Airway & Oxygen Therapy: Patient Spontanous Breathing  Post-op Assessment: Report given to RN and Post -op Vital signs reviewed and stable  Post vital signs: Reviewed and stable  Last Vitals:  Vitals Value Taken Time  BP 144/82 08/11/23 0922  Temp    Pulse 85 08/11/23 0925  Resp 14 08/11/23 0925  SpO2 95 % 08/11/23 0925  Vitals shown include unfiled device data.  Last Pain:  Vitals:   08/11/23 1308  TempSrc: Temporal  PainSc: 0-No pain         Complications: No notable events documented.

## 2023-08-11 NOTE — H&P (Signed)
HISTORY AND PHYSICAL INTERVAL NOTE:  08/11/2023  7:22 AM  Anna Mccarthy  has presented today for surgery, with the diagnosis of M79.671 - Acute foot pain, right M20.11 - Hallux valgus of right foot.  The various methods of treatment have been discussed with the patient.  No guarantees were given.  After consideration of risks, benefits and other options for treatment, the patient has consented to surgery.  I have reviewed the patients' chart and labs.    Lungs:  CTA CV:  RRR.  No murmur    A history and physical examination was performed in my office.  The patient was reexamined.  There have been no changes to this history and physical examination.  Gwyneth Revels A

## 2023-08-11 NOTE — Op Note (Signed)
Operative note   Surgeon:Jerelle Virden Armed forces logistics/support/administrative officer: None    Preop diagnosis: 1)Hallux valgus deformity right foot foot.    Postop diagnosis: Same    Procedure: 1)Lapidus hallux valgus correction right foot.  2)Akin proximal phalanx osteotomy right foot  3) intraoperative fluoroscopy without assistance of radiologist    EBL: Minimal    Anesthesia:local and general local consisted of a one-to-one mixture of 0.25% bupivacaine with epinephrine and Exparel long-acting anesthetic    Hemostasis: Midcalf tourniquet inflated to 200 mmHg for 80 minutes    Specimen: None    Complications: None    Operative indications:Anna Mccarthy is an 68 y.o. that presents today for surgical intervention.  The risks/benefits/alternatives/complications have been discussed and consent has been given.    Procedure:  Patient was brought into the OR and placed on the operating table in thesupine position. After anesthesia was obtained theright lower extremity was prepped and draped in usual sterile fashion.  Attention was then directed to the dorsomedial first MTPJ where an incision was performed.  Sharp and blunt dissection was carried down to the capsule.  The intermetatarsal space was then entered.  The conjoined tendon of the abductor was then freed from the base of the proximal phalanx.  Attention was directed to the dorsal aspect of the foot where a dorsal incision was made at the first met cuneiforms joint.  Sharp and blunt dissection was carried down to the periosteum.  Subperiosteal dissection was then undertaken.  This exposed the first met cuneiform joint.  This was then freed and loosened.  A small fulcrum was placed between the base of the first metatarsal and second metatarsal.  Next the joint positioner was placed on the medial aspect of the metatarsal.  A small stab incision was made at the second metatarsal.  Compression was placed for the positioner.  Good realignment of the first intermetatarsal  angle was noted at this time.  At this time the osteotomy cut guide was placed into the joint region.  2 vertical cuts were then made.  The cartilage material was removed from the first met cuneiform joint and the joint was then prepped with a 2.0 mm drill bit.  The joint compressor was then placed.  Good compression and realignment was noted.  Next the medial and dorsal locking plates were then placed from the lapiplasty set.  Realignment and stability was noted in all planes.  There was noted to be some mild residual valgus of the great toe.  At this time the decision for an Akin osteotomy was made.  The proximal phalanx was dissected away from soft tissue.  A 0.045 K wire was driven in the lateral aspect of the proximal phalanx.  This was used as the apical access guide.  A small wedge with the apex lateral was removed from the midshaft of the proximal phalanx.  This was compressed and stabilized with a 10 mm compression bone staple.  Good realignment of the great toe was noted at this time.  Attention was redirected to the dorsomedial first MTPJ.  A small T capsulotomy was performed.  The dorsomedial eminence was noted and transected and smoothed with a power rasp.  A small capsulorrhaphy was performed.  Closure was then performed after all areas were irrigated.  3-0 Vicryl for the capsular tissue.  4-0 Vicryl in subcutaneous tissue and a 4-0 Monocryl for the skin.   Further local anesthesia was performed at the end of the case.    Patient  tolerated the procedure and anesthesia well.  Was transported from the OR to the PACU with all vital signs stable and vascular status intact. To be discharged per routine protocol.  Will follow up in approximately 1 week in the outpatient clinic.

## 2023-09-01 ENCOUNTER — Encounter: Payer: Self-pay | Admitting: Podiatry

## 2023-09-13 ENCOUNTER — Encounter: Payer: Self-pay | Admitting: Podiatry

## 2023-11-13 ENCOUNTER — Encounter: Payer: Self-pay | Admitting: Podiatry

## 2023-11-13 NOTE — Anesthesia Preprocedure Evaluation (Addendum)
 Anesthesia Evaluation  Patient identified by MRN, date of birth, ID band Patient awake    Reviewed: Allergy & Precautions, H&P , NPO status , Patient's Chart, lab work & pertinent test results  Airway Mallampati: II  TM Distance: >3 FB Neck ROM: Full    Dental no notable dental hx.    Pulmonary pneumonia, former smoker   Pulmonary exam normal breath sounds clear to auscultation       Cardiovascular hypertension, Normal cardiovascular exam Rhythm:Regular Rate:Normal     Neuro/Psych   Anxiety      Neuromuscular disease negative neurological ROS  negative psych ROS   GI/Hepatic negative GI ROS, Neg liver ROS,GERD  ,,  Endo/Other  negative endocrine ROS    Renal/GU negative Renal ROS  negative genitourinary   Musculoskeletal negative musculoskeletal ROS (+) Arthritis ,  Fibromyalgia -  Abdominal   Peds negative pediatric ROS (+)  Hematology negative hematology ROS (+)   Anesthesia Other Findings Had right bunionectomy 08-11-23 Dr. Girard Lam  Allergy  Arthritis Bronchitis  Essential hypertension Anxiety  GERD (gastroesophageal reflux disease) Cancer (HCC) Fibromyalgia Osteoarthritis Pneumonia Vertigo Psoriatic arthritis  Varicose veins of bilateral lower extremities with pain  Chronic venous insufficiency Vitamin D deficiency  Hypercholesterolemia Hyperlipidemia  IBS (irritable bowel syndrome) Diverticulosis  Osteoporosis, post-menopausal History of vertigo Patient states she can take hydrocodone  and can take acetaminophen , just can't take oxycodone   Reproductive/Obstetrics negative OB ROS                             Anesthesia Physical Anesthesia Plan  ASA: 2  Anesthesia Plan: General   Post-op Pain Management:    Induction: Intravenous  PONV Risk Score and Plan:   Airway Management Planned: Natural Airway and Nasal Cannula  Additional Equipment:   Intra-op Plan:    Post-operative Plan: Extubation in OR  Informed Consent: I have reviewed the patients History and Physical, chart, labs and discussed the procedure including the risks, benefits and alternatives for the proposed anesthesia with the patient or authorized representative who has indicated his/her understanding and acceptance.     Dental Advisory Given  Plan Discussed with: Anesthesiologist, CRNA and Surgeon  Anesthesia Plan Comments: (Patient consented for risks of anesthesia including but not limited to:  - adverse reactions to medications - risk of airway placement if required - damage to eyes, teeth, lips or other oral mucosa - nerve damage due to positioning  - sore throat or hoarseness - Damage to heart, brain, nerves, lungs, other parts of body or loss of life  Patient voiced understanding and assent.)        Anesthesia Quick Evaluation

## 2023-11-14 ENCOUNTER — Encounter: Payer: Self-pay | Admitting: Podiatry

## 2023-11-14 ENCOUNTER — Encounter (INDEPENDENT_AMBULATORY_CARE_PROVIDER_SITE_OTHER): Payer: Self-pay

## 2023-11-16 ENCOUNTER — Other Ambulatory Visit: Payer: Self-pay | Admitting: Podiatry

## 2023-11-17 NOTE — Discharge Instructions (Signed)

## 2023-11-22 ENCOUNTER — Ambulatory Visit: Payer: Self-pay | Admitting: Anesthesiology

## 2023-11-22 ENCOUNTER — Other Ambulatory Visit: Payer: Self-pay

## 2023-11-22 ENCOUNTER — Encounter
Admission: RE | Disposition: A | Discharge status: Home / Self Care | Payer: Self-pay | Source: Home / Self Care | Attending: Podiatry

## 2023-11-22 ENCOUNTER — Ambulatory Visit
Admission: RE | Admit: 2023-11-22 | Discharge: 2023-11-22 | Disposition: A | Discharge status: Home / Self Care | Attending: Podiatry | Admitting: Podiatry

## 2023-11-22 ENCOUNTER — Encounter: Payer: Self-pay | Admitting: Podiatry

## 2023-11-22 ENCOUNTER — Ambulatory Visit: Payer: Self-pay

## 2023-11-22 DIAGNOSIS — K219 Gastro-esophageal reflux disease without esophagitis: Secondary | ICD-10-CM | POA: Diagnosis not present

## 2023-11-22 DIAGNOSIS — M960 Pseudarthrosis after fusion or arthrodesis: Secondary | ICD-10-CM | POA: Insufficient documentation

## 2023-11-22 DIAGNOSIS — M797 Fibromyalgia: Secondary | ICD-10-CM | POA: Diagnosis not present

## 2023-11-22 DIAGNOSIS — Z87891 Personal history of nicotine dependence: Secondary | ICD-10-CM | POA: Insufficient documentation

## 2023-11-22 DIAGNOSIS — M2012 Hallux valgus (acquired), left foot: Secondary | ICD-10-CM | POA: Diagnosis present

## 2023-11-22 HISTORY — DX: Personal history of other specified conditions: Z87.898

## 2023-11-22 HISTORY — PX: HARVEST BONE GRAFT: SHX377

## 2023-11-22 HISTORY — PX: ORIF TOE FRACTURE: SHX5032

## 2023-11-22 SURGERY — OPEN REDUCTION INTERNAL FIXATION (ORIF) METATARSAL (TOE) FRACTURE
Anesthesia: General | Site: Toe | Laterality: Right

## 2023-11-22 SURGERY — Surgical Case
Anesthesia: *Unknown

## 2023-11-22 MED ORDER — BUPIVACAINE HCL (PF) 0.25 % IJ SOLN
INTRAMUSCULAR | Status: DC | PRN
Start: 2023-11-22 — End: 2023-11-22
  Administered 2023-11-22: 10 mL

## 2023-11-22 MED ORDER — ONDANSETRON HCL 4 MG PO TABS
4.0000 mg | ORAL_TABLET | Freq: Four times a day (QID) | ORAL | Status: DC | PRN
Start: 2023-11-22 — End: 2023-11-22

## 2023-11-22 MED ORDER — ONDANSETRON HCL 4 MG/2ML IJ SOLN
INTRAMUSCULAR | Status: DC | PRN
  Administered 2023-11-22: 4 mg via INTRAVENOUS

## 2023-11-22 MED ORDER — EPHEDRINE 5 MG/ML INJ
INTRAVENOUS | Status: AC
  Filled 2023-11-22: qty 5

## 2023-11-22 MED ORDER — EPHEDRINE SULFATE (PRESSORS) 50 MG/ML IJ SOLN
INTRAMUSCULAR | Status: DC | PRN
  Administered 2023-11-22: 5 mg via INTRAVENOUS

## 2023-11-22 MED ORDER — SEVOFLURANE IN SOLN
RESPIRATORY_TRACT | Status: AC
  Filled 2023-11-22: qty 250

## 2023-11-22 MED ORDER — DEXMEDETOMIDINE HCL IN NACL 200 MCG/50ML IV SOLN
INTRAVENOUS | Status: DC | PRN
Start: 2023-11-22 — End: 2023-11-22
  Administered 2023-11-22: 4 ug via INTRAVENOUS

## 2023-11-22 MED ORDER — HYDROCODONE-ACETAMINOPHEN 5-325 MG PO TABS
ORAL_TABLET | ORAL | Status: AC
  Filled 2023-11-22: qty 2

## 2023-11-22 MED ORDER — MIDAZOLAM HCL 5 MG/5ML IJ SOLN
INTRAMUSCULAR | Status: DC | PRN
  Administered 2023-11-22: 2 mg via INTRAVENOUS

## 2023-11-22 MED ORDER — OXYCODONE HCL 5 MG PO TABS
ORAL_TABLET | ORAL | Status: AC
  Filled 2023-11-22: qty 2

## 2023-11-22 MED ORDER — LIDOCAINE HCL (PF) 2 % IJ SOLN
INTRAMUSCULAR | Status: AC
  Filled 2023-11-22: qty 5

## 2023-11-22 MED ORDER — HEPARIN SODIUM (PORCINE) 5000 UNIT/ML IJ SOLN
INTRAMUSCULAR | Status: AC
  Filled 2023-11-22: qty 1

## 2023-11-22 MED ORDER — DEXAMETHASONE SODIUM PHOSPHATE 4 MG/ML IJ SOLN
INTRAMUSCULAR | Status: AC
Start: 2023-11-22 — End: ?
  Filled 2023-11-22: qty 1

## 2023-11-22 MED ORDER — DEXAMETHASONE SODIUM PHOSPHATE 4 MG/ML IJ SOLN
INTRAMUSCULAR | Status: DC | PRN
  Administered 2023-11-22: 4 mg via INTRAVENOUS

## 2023-11-22 MED ORDER — METOCLOPRAMIDE HCL 5 MG/ML IJ SOLN: 5.0000 mg | Freq: Three times a day (TID) | INTRAMUSCULAR | Status: DC | PRN

## 2023-11-22 MED ORDER — ONDANSETRON HCL 4 MG/2ML IJ SOLN: 4.0000 mg | Freq: Four times a day (QID) | INTRAMUSCULAR | Status: DC | PRN

## 2023-11-22 MED ORDER — FENTANYL CITRATE (PF) 100 MCG/2ML IJ SOLN
INTRAMUSCULAR | Status: AC
  Filled 2023-11-22: qty 2

## 2023-11-22 MED ORDER — ONDANSETRON HCL 4 MG/2ML IJ SOLN
INTRAMUSCULAR | Status: AC
  Filled 2023-11-22: qty 2

## 2023-11-22 MED ORDER — LACTATED RINGERS IV SOLN: INTRAVENOUS | Status: DC

## 2023-11-22 MED ORDER — CEFAZOLIN SODIUM-DEXTROSE 2-3 GM-%(50ML) IV SOLR
INTRAVENOUS | Status: AC
  Filled 2023-11-22: qty 50

## 2023-11-22 MED ORDER — PROPOFOL 10 MG/ML IV BOLUS
INTRAVENOUS | Status: DC | PRN
  Administered 2023-11-22: 200 mg via INTRAVENOUS

## 2023-11-22 MED ORDER — LIDOCAINE HCL (CARDIAC) PF 100 MG/5ML IV SOSY
PREFILLED_SYRINGE | INTRAVENOUS | Status: DC | PRN
  Administered 2023-11-22: 100 mg via INTRATRACHEAL

## 2023-11-22 MED ORDER — BUPIVACAINE LIPOSOME 1.3 % IJ SUSP
INTRAMUSCULAR | Status: DC | PRN
  Administered 2023-11-22: 10 mL

## 2023-11-22 MED ORDER — SODIUM CHLORIDE 0.9 % IV SOLN: INTRAVENOUS | Status: DC | PRN

## 2023-11-22 MED ORDER — PROPOFOL 1000 MG/100ML IV EMUL
INTRAVENOUS | Status: AC
  Filled 2023-11-22: qty 100

## 2023-11-22 MED ORDER — FENTANYL CITRATE PF 50 MCG/ML IJ SOSY
50.0000 ug | PREFILLED_SYRINGE | INTRAMUSCULAR | Status: DC | PRN
  Administered 2023-11-22: 50 ug via INTRAVENOUS

## 2023-11-22 MED ORDER — HYDROCODONE-ACETAMINOPHEN 5-325 MG PO TABS: 1.0000 | ORAL_TABLET | Freq: Four times a day (QID) | ORAL | 0 refills | Status: AC | PRN

## 2023-11-22 MED ORDER — PROPOFOL 500 MG/50ML IV EMUL
INTRAVENOUS | Status: DC | PRN
Start: 2023-11-22 — End: 2023-11-22
  Administered 2023-11-22: 35 ug/kg/min via INTRAVENOUS

## 2023-11-22 MED ORDER — METOCLOPRAMIDE HCL 5 MG PO TABS: 5.0000 mg | ORAL_TABLET | Freq: Three times a day (TID) | ORAL | Status: DC | PRN

## 2023-11-22 MED ORDER — MIDAZOLAM HCL 2 MG/2ML IJ SOLN
INTRAMUSCULAR | Status: AC
  Filled 2023-11-22: qty 2

## 2023-11-22 MED ORDER — PROPOFOL 10 MG/ML IV BOLUS
INTRAVENOUS | Status: AC
  Filled 2023-11-22: qty 20

## 2023-11-22 MED ORDER — FENTANYL CITRATE (PF) 100 MCG/2ML IJ SOLN
INTRAMUSCULAR | Status: DC | PRN
  Administered 2023-11-22 (×4): 25 ug via INTRAVENOUS

## 2023-11-22 MED ORDER — HEPARIN SODIUM (PORCINE) 5000 UNIT/ML IJ SOLN
INTRAMUSCULAR | Status: DC | PRN
  Administered 2023-11-22: 5000 [IU] via SUBCUTANEOUS

## 2023-11-22 MED ORDER — BUPIVACAINE LIPOSOME 1.3 % IJ SUSP
INTRAMUSCULAR | Status: AC
  Filled 2023-11-22: qty 10

## 2023-11-22 MED ORDER — CEFAZOLIN SODIUM-DEXTROSE 2-4 GM/100ML-% IV SOLN
2.0000 g | INTRAVENOUS | Status: AC
  Administered 2023-11-22: 2 g via INTRAVENOUS

## 2023-11-22 MED ORDER — HYDROCODONE-ACETAMINOPHEN 5-325 MG PO TABS
1.0000 | ORAL_TABLET | ORAL | Status: DC | PRN
  Administered 2023-11-22: 2 via ORAL

## 2023-11-22 SURGICAL SUPPLY — 61 items
BANDAGE ELASTIC 4 LF NS (GAUZE/BANDAGES/DRESSINGS) ×2 IMPLANT
BENZOIN TINCTURE PRP APPL 2/3 (GAUZE/BANDAGES/DRESSINGS) IMPLANT
BIT DRILL 2 FENESTRATED (MISCELLANEOUS) IMPLANT
BIT DRILL 2.4X140 LONG SOLID (BIT) IMPLANT
BIT DRILL CANNULTD 2.6 X 130MM (DRILL) IMPLANT
BLADE SURG MINI STRL (BLADE) ×4 IMPLANT
BNDG COHESIVE 4X5 TAN STRL (GAUZE/BANDAGES/DRESSINGS) ×2 IMPLANT
BNDG ESMARCH 6X12 STRL LF (GAUZE/BANDAGES/DRESSINGS) IMPLANT
BNDG GAUZE 4.5X4.1 6PLY STRL (MISCELLANEOUS) ×2 IMPLANT
BNDG GAUZE DERMACEA FLUFF 4 (GAUZE/BANDAGES/DRESSINGS) IMPLANT
BNDG STRETCH 4X75 STRL LF (GAUZE/BANDAGES/DRESSINGS) ×2 IMPLANT
BOOT STEPPER DURA SM (SOFTGOODS) IMPLANT
CANISTER SUCT 1200ML W/VALVE (MISCELLANEOUS) ×2 IMPLANT
COVER LIGHT HANDLE 1/PK (MISCELLANEOUS) IMPLANT
DRAPE FLUOR MINI C-ARM 54X84 (DRAPES) ×2 IMPLANT
DRAPE INCISE IOBAN 66X45 STRL (DRAPES) IMPLANT
DURAPREP 26ML APPLICATOR (WOUND CARE) ×2 IMPLANT
ELECTRODE REM PT RTRN 9FT ADLT (ELECTROSURGICAL) ×2 IMPLANT
GAUZE SPONGE 4X4 12PLY STRL (GAUZE/BANDAGES/DRESSINGS) ×2 IMPLANT
GAUZE XEROFORM 1X8 LF (GAUZE/BANDAGES/DRESSINGS) ×2 IMPLANT
GLOVE BIO SURGEON STRL SZ 6.5 (GLOVE) IMPLANT
GLOVE BIOGEL PI IND STRL 8 (GLOVE) ×2 IMPLANT
GLOVE SURG SS PI 7.5 STRL IVOR (GLOVE) ×2 IMPLANT
GOWN STRL REUS W/ TWL LRG LVL3 (GOWN DISPOSABLE) ×4 IMPLANT
GRAFT DMB PUTTY 1 OPTIUM FD (Bone Implant) IMPLANT
KIT ASP BONE MRW 11G (KITS) IMPLANT
KIT TURNOVER KIT A (KITS) ×2 IMPLANT
KWIRE SMOOTH TROCAR 2.0X150 (WIRE) IMPLANT
KWIRE SNGL END 1.2X150 (MISCELLANEOUS) IMPLANT
NDL FILTER BLUNT 18X1 1/2 (NEEDLE) ×2 IMPLANT
NEEDLE FILTER BLUNT 18X1 1/2 (NEEDLE) ×4 IMPLANT
NS IRRIG 500ML POUR BTL (IV SOLUTION) ×2 IMPLANT
PACK EXTREMITY ARMC (MISCELLANEOUS) ×2 IMPLANT
PENCIL SMOKE EVAC W/HOLSTER (ELECTROSURGICAL) IMPLANT
PENCIL SMOKE EVACUATOR (MISCELLANEOUS) IMPLANT
PLATE LAPIDUS 3H STD RT (Plate) IMPLANT
PUTTY DBM OPTIUM 1CC (Bone Implant) ×2 IMPLANT
SCREW 3.5X16 NONLOCKING (Screw) IMPLANT
SCREW CANN.SHORT HEAD 4.0X32MM (Screw) IMPLANT
SCREW CANNULATED 4.0X36S (Screw) IMPLANT
SCREW COUNTERSINK 4.0 HEADED (MISCELLANEOUS) IMPLANT
SCREW LOCK PLATE R3 3.5X14 (Screw) IMPLANT
SCREW LOCK PLATE R3 3.5X16 (Screw) IMPLANT
SCREW NON LOCK 3.5X18 (Screw) IMPLANT
SCREW NON LOCKING 3.5X14 (Screw) IMPLANT
STIMULATOR BONE GROWTH EMG EXT (ORTHOPEDIC SUPPLIES) IMPLANT
STOCKINETTE IMPERVIOUS LG (DRAPES) ×2 IMPLANT
STRAP BODY AND KNEE 60X3 (MISCELLANEOUS) ×2 IMPLANT
STRIP CLOSURE SKIN 1/4X4 (GAUZE/BANDAGES/DRESSINGS) ×2 IMPLANT
SUCTION TUBE FRAZIER 10FR DISP (SUCTIONS) IMPLANT
SUT ETHILON 3-0 (SUTURE) IMPLANT
SUT VIC AB 3-0 SH 27X BRD (SUTURE) IMPLANT
SUT VIC AB 4-0 FS2 27 (SUTURE) ×2 IMPLANT
SUT VIC AB 4-0 SH 27XANBCTRL (SUTURE) IMPLANT
SUTURE MNCRL 4-0 27XMF (SUTURE) IMPLANT
SYR 10CC LOCKING BMA NDL (SYRINGE) IMPLANT
SYR 10CC LOCKING BMA NEEDLE (SYRINGE) ×2 IMPLANT
SYR 10ML LL (SYRINGE) ×2 IMPLANT
SYR 3ML LL SCALE MARK (SYRINGE) IMPLANT
TUBING CONNECTING 10 (TUBING) IMPLANT
WIRE OLIVE SMOOTH 1.4MMX60MM (WIRE) IMPLANT

## 2023-11-22 NOTE — H&P (Signed)
 HISTORY AND PHYSICAL INTERVAL NOTE:  11/22/2023  11:37 AM  Anna Mccarthy  has presented today for surgery, with the diagnosis of Acquired hallux valgus of left foot  Nonunion after arthrodesis.  The various methods of treatment have been discussed with the patient.  No guarantees were given.  After consideration of risks, benefits and other options for treatment, the patient has consented to surgery.  I have reviewed the patients' chart and labs.     A history and physical examination was performed in my office.  The patient was reexamined.  There have been no changes to this history and physical examination.  Anell Baptist A

## 2023-11-22 NOTE — Anesthesia Postprocedure Evaluation (Signed)
 Anesthesia Post Note  Patient: Anna Mccarthy  Procedure(s) Performed: OPEN REDUCTION INTERNAL FIXATION (ORIF) METATARSAL (TOE) FRACTURE (Right: Toe) PROCEDURE, BONE GRAFT (Right: Foot)  Patient location during evaluation: PACU Anesthesia Type: General Level of consciousness: awake and alert Pain management: pain level controlled Vital Signs Assessment: post-procedure vital signs reviewed and stable Respiratory status: spontaneous breathing, nonlabored ventilation, respiratory function stable and patient connected to nasal cannula oxygen Cardiovascular status: blood pressure returned to baseline and stable Postop Assessment: no apparent nausea or vomiting Anesthetic complications: no   No notable events documented.   Last Vitals:  Vitals:   11/22/23 1451 11/22/23 1519  BP: 136/71 138/72  Pulse: 80   Resp: 20   Temp: 36.7 C 36.7 C  SpO2: 99%     Last Pain:  Vitals:   11/22/23 1519  TempSrc:   PainSc: 3                  Chevelle Durr C Kemiya Batdorf

## 2023-11-22 NOTE — Transfer of Care (Signed)
 Immediate Anesthesia Transfer of Care Note  Patient: Anna Mccarthy  Procedure(s) Performed: OPEN REDUCTION INTERNAL FIXATION (ORIF) METATARSAL (TOE) FRACTURE (Right: Toe) PROCEDURE, BONE GRAFT (Right: Foot)  Patient Location: PACU  Anesthesia Type: General  Level of Consciousness: awake, alert  and patient cooperative  Airway and Oxygen Therapy: Patient Spontanous Breathing and Patient connected to supplemental oxygen  Post-op Assessment: Post-op Vital signs reviewed, Patient's Cardiovascular Status Stable, Respiratory Function Stable, Patent Airway and No signs of Nausea or vomiting  Post-op Vital Signs: Reviewed and stable  Complications: No notable events documented.

## 2023-11-22 NOTE — Op Note (Signed)
 Operative note   Surgeon:Lileigh Fahringer [employer] forces logistics/support/administrative officer: None    Preop diagnosis: Nonunion right first metatarsocuneiform joint    Postop diagnosis: Same    Procedure: 1.repair nonunion right first metatarsocuneiform joint 2.  Calcaneal marrow aspirate bone graft 3.  Intraoperative fluoroscopy use without assistance of radiologist    EBL: Minimal    Anesthesia:local and general.  Local consist of a one-to-one mixture of 0 .25% bupivacaine  plain and Exparel  long-acting anesthetic    Hemostasis: Mid calf tourniquet inflated to 200 mmHg for 120 minutes    Specimen: None    Complications: None    Operative indications:Anna Mccarthy is an 68 y.o. that presents today for surgical intervention.  The risks/benefits/alternatives/complications have been discussed and consent has been given.    Procedure:  Patient was brought into the OR and placed on the operating table in thesupine position. After anesthesia was obtained theright lower extremity was prepped and draped in usual sterile fashion.  Attention was directed to the dorsal first metatarsal cuneiform joint where the incision was placed.  Sharp and blunt dissected down to the periosteum.  Subperiosteal dissection was undertaken.  Hardware was removed.  At this point the first met cuneiform joint was noted and found to have a complete nonunion with fibrotic tissue.  All nonunion material was removed with a curette and rongeur down to healthy bleeding bone.  A small stab incision was made on the lateral aspect of the calcaneus.  The bone marrow aspirate was then removed from the calcaneus with the Paragon aspirate set.  A single nylon suture was used.  The aspirate was then mixed with 1 cc of DBM and placed on the back table for joint preparation.  The joint was flushed.  This was then prepped with a 2.0 mm drill bit.  It was then packed with the bone marrow aspirate and DBM mixture.  Realignment of the intermetatarsal angle was performed.   Difficulty and complete reduction of the IM was noted.  I was able to reduce this to lesser of degree as the preoperative x-ray showed.  Next a 4.0 mm cannulated compression screw was placed from the distal lateral aspect of the fusion site crossing into the central plantar aspect of the cuneiform.  Good compression and realignment was noted.  Next the medial Lapidus plate from the Paragon screw set was then placed with compression noted through the plate.  Excellent stability was noted.  Final packing of the fusion site was then performed with remainder of the bone marrow aspirate graft material.  Closure was then performed with 3-0 Vicryl the deeper and subcutaneous tissue and 4-0 Monocryl for the skin.  All stab incisions were closed with 3-0 nylon.  A bulky sterile dressing was applied.  She was placed in an equalizer walker boot with the foot in a neutral position.    Patient tolerated the procedure and anesthesia well.  Was transported from the OR to the PACU with all vital signs stable and vascular status intact. To be discharged per routine protocol.  Will follow up in approximately 1 week in the outpatient clinic.

## 2024-04-20 ENCOUNTER — Telehealth: Admitting: Physician Assistant

## 2024-04-20 DIAGNOSIS — J069 Acute upper respiratory infection, unspecified: Secondary | ICD-10-CM

## 2024-04-20 NOTE — Progress Notes (Signed)
  Because of the duration of your symptoms and no improvement, I feel your condition warrants further evaluation and I recommend that you be seen in a face-to-face visit.   NOTE: There will be NO CHARGE for this E-Visit   If you are having a true medical emergency, please call 911.     For an urgent face to face visit, [place] has multiple urgent care centers for your convenience.  Click the link below for the full list of locations and hours, walk-in wait times, appointment scheduling options and driving directions:  Urgent Care - [place], [place], [place], [place], [place], [place]  [place]     Your MyChart E-visit questionnaire answers were reviewed by a board certified advanced clinical practitioner to complete your personal care plan based on your specific symptoms.    Thank you for using e-Visits.

## 2024-04-21 ENCOUNTER — Ambulatory Visit
Admission: RE | Admit: 2024-04-21 | Discharge: 2024-04-21 | Disposition: A | Discharge status: Home / Self Care | Source: Ambulatory Visit | Attending: Emergency Medicine | Admitting: Emergency Medicine

## 2024-04-21 VITALS — BP 143/83 | HR 76 | Temp 99.1°F | Resp 20

## 2024-04-21 DIAGNOSIS — J0100 Acute maxillary sinusitis, unspecified: Secondary | ICD-10-CM | POA: Diagnosis not present

## 2024-04-21 MED ORDER — AMOXICILLIN-POT CLAVULANATE 875-125 MG PO TABS: 1.0000 | ORAL_TABLET | Freq: Two times a day (BID) | ORAL | 0 refills | Status: AC

## 2024-04-21 MED ORDER — BENZONATATE 100 MG PO CAPS: 100.0000 mg | ORAL_CAPSULE | Freq: Three times a day (TID) | ORAL | 0 refills | Status: AC

## 2024-04-21 MED ORDER — PROMETHAZINE-DM 6.25-15 MG/5ML PO SYRP: 5.0000 mL | ORAL_SOLUTION | Freq: Every evening | ORAL | 0 refills | Status: AC | PRN

## 2024-04-21 NOTE — ED Triage Notes (Signed)
 Patient reports cough with yellow mucus, and nasal drainage x 1.5 weeks. Patient has taken mucinex DM, and cough syrup with mild relief. Denies pain

## 2024-04-21 NOTE — ED Provider Notes (Signed)
 Anna Mccarthy    CSN: [id number] Arrival date & time: 04/21/24  1016      History   Chief Complaint Chief Complaint  Patient presents with   Cough    Sinusitis and productive cough - Entered by patient   Nasal Congestion    HPI Anna Mccarthy is a 68 y.o. female.   Patient presents for evaluation of generalized bodyaches, nasal congestion, productive cough, bilateral ear pressure, sinus pressure to the cheeks and intermittent headaches present for 10 days.  Has heard crackling to the chest but denies presence of shortness of breath or wheezing.  Tolerable to food and liquids but appetite is decreased.  No known sick contacts prior.  Has attempted use of Coricidin, Mucinex DM and Delsym.  Past Medical History:  Diagnosis Date   Allergy    Anxiety    Arthritis    Bronchitis    Cancer (HCC)    skin right leg   Chronic venous insufficiency    Diverticulosis    Essential hypertension    Fibromyalgia    GERD (gastroesophageal reflux disease)    History of vertigo    Hypercholesterolemia    Hyperlipidemia    IBS (irritable bowel syndrome)    Osteoarthritis    Osteoporosis, post-menopausal    Pneumonia    Psoriatic arthritis (HCC)    Varicose veins of bilateral lower extremities with pain    Vertigo    none in several years   Vitamin D deficiency     Patient Active Problem List   Diagnosis Date Noted   Carpal tunnel syndrome, right upper limb    Bilateral carpal tunnel syndrome 10/07/2021   Primary osteoarthritis of left knee 11/02/2020   Status post left knee replacement 11/02/2020   Status post total right knee replacement 08/10/2020   Primary osteoarthritis of right knee 01/30/2020   Chronic pain of left knee 09/11/2018   Meniscus degeneration, left 09/11/2018   Fibromyalgia 09/11/2018   Chronic pain syndrome 09/11/2018   Venous ulcer (HCC) 02/02/2017   Varicose veins of bilateral lower extremities with pain 05/16/2016   Chronic venous insufficiency  05/16/2016   Pain in limb 05/16/2016   Swelling of limb 05/16/2016   DJD (degenerative joint disease) 05/16/2016    Past Surgical History:  Procedure Laterality Date   ABDOMINAL HYSTERECTOMY  1996   AIKEN OSTEOTOMY Right 08/11/2023   Procedure: 71701 KATRINA OSTEOTOMY;  Surgeon: Ashley Soulier, DPM;  Location: [facility];  Service: Orthopedics/Podiatry;  Laterality: Right;   BREAST BIOPSY Left 2011   stereo done by dr. dessa -benign   BUNIONECTOMY Right 08/11/2023   Procedure: BUNIONECTOMY LAPIDUS TYPE;  Surgeon: Ashley Soulier, DPM;  Location: [facility];  Service: Orthopedics/Podiatry;  Laterality: Right;   CARPAL TUNNEL RELEASE Right 11/24/2021   Procedure: RIGHT CARPAL TUNNEL RELEASE;  Surgeon: Romona Harari, MD;  Location: [facility];  Service: Orthopedics;  Laterality: Right;   CARPAL TUNNEL RELEASE Left 2020   COLONOSCOPY     COLONOSCOPY WITH PROPOFOL  N/A 05/30/2016   Procedure: COLONOSCOPY WITH PROPOFOL ;  Surgeon: Gladis RAYMOND Mariner, MD;  Location: [facility];  Service: Endoscopy;  Laterality: N/A;   DILATION AND CURETTAGE OF UTERUS     x3   HARVEST BONE GRAFT Right 11/22/2023   Procedure: PROCEDURE, BONE GRAFT;  Surgeon: Ashley Soulier, DPM;  Location: [facility];  Service: Orthopedics/Podiatry;  Laterality: Right;   KNEE ARTHROSCOPY WITH MEDIAL MENISECTOMY Left 02/18/2016   Procedure: KNEE ARTHROSCOPY WITH PARTIAL MEDIAL MENISECTOMY;  Surgeon: Ozell Flake, MD;  Location: [facility];  Service: Orthopedics;  Laterality: Left;   ORIF TOE FRACTURE Right 11/22/2023   Procedure: OPEN REDUCTION INTERNAL FIXATION (ORIF) METATARSAL (TOE) FRACTURE;  Surgeon: Ashley Soulier, DPM;  Location: [facility];  Service: Orthopedics/Podiatry;  Laterality: Right;  Repair, nonunion   TOTAL KNEE ARTHROPLASTY Right 08/10/2020   Procedure: RIGHT TOTAL KNEE ARTHROPLASTY;  Surgeon: Jerri Kay HERO, MD;  Location: [facility];  Service: Orthopedics;  Laterality: Right;   TOTAL  KNEE ARTHROPLASTY Left 11/02/2020   Procedure: LEFT TOTAL KNEE ARTHROPLASTY;  Surgeon: Jerri Kay HERO, MD;  Location: [facility];  Service: Orthopedics;  Laterality: Left;   TUBAL LIGATION  1984   UMBILICAL HERNIA REPAIR  2012   VARICOSE VEIN SURGERY Bilateral     OB History     Gravida  6   Para      Term      Preterm      AB  3   Living  3      SAB  3   IAB      Ectopic      Multiple      Live Births           Obstetric Comments  1st Menstrual Cycle:  12 1st Pregnancy:          Home Medications    Prior to Admission medications   Medication Sig Start Date End Date Taking? Authorizing Provider  amoxicillin -clavulanate (AUGMENTIN) 875-125 MG tablet Take 1 tablet by mouth every 12 (twelve) hours. 04/21/24  Yes Percilla Tweten R, NP  benzonatate  (TESSALON ) 100 MG capsule Take 1 capsule (100 mg total) by mouth every 8 (eight) hours. 04/21/24  Yes Tena Linebaugh, Shelba SAUNDERS, NP  promethazine -dextromethorphan (PROMETHAZINE -DM) 6.25-15 MG/5ML syrup Take 5 mLs by mouth at bedtime as needed. 04/21/24  Yes Franchot Pollitt R, NP  acetaminophen  (TYLENOL ) 500 MG tablet Take 1,000 mg by mouth every 6 (six) hours as needed for moderate pain (pain score 4-6).    [provider]  ALPRAZolam  (XANAX ) 0.25 MG tablet Take 0.25 mg by mouth at bedtime as needed for anxiety.    [provider]  amLODipine (NORVASC) 2.5 MG tablet Take 2.5 mg by mouth every evening.    [provider]  aspirin  EC 81 MG tablet Take 1 tablet (81 mg total) by mouth daily. Swallow whole. Patient not taking: Reported on 11/14/2023 08/11/23   Ashley Soulier, DPM  DULoxetine  (CYMBALTA ) 20 MG capsule Take 60 mg by mouth in the morning. 03/22/17   [provider]  folic acid (FOLVITE) 1 MG tablet Take 1 mg by mouth in the morning.    [provider]  gabapentin  (NEURONTIN ) 100 MG capsule Take 2 capsules (200 mg total) by mouth in the morning, at noon, and at bedtime. 04/20/20 08/02/26   Marcelino Nurse, MD  hydrochlorothiazide  (HYDRODIURIL ) 25 MG tablet Take 25 mg by mouth in the morning.    [provider]  HYDROcodone -acetaminophen  (NORCO/VICODIN) 5-325 MG tablet Take 1-2 tablets by mouth every 6 (six) hours as needed for moderate pain (pain score 4-6). Max 6 tabs per day Patient not taking: Reported on 11/14/2023 08/11/23   Ashley Soulier, DPM  HYDROcodone -acetaminophen  (NORCO/VICODIN) 5-325 MG tablet Take 1 tablet by mouth every 6 (six) hours as needed for moderate pain (pain score 4-6). 11/22/23   Ashley Soulier, DPM  methotrexate (RHEUMATREX) 2.5 MG tablet Take 15 mg by mouth every Tuesday. Caution:Chemotherapy. Protect from light.  (Tuesdays)  [provider]  omeprazole (PRILOSEC) 40 MG capsule Take 40 mg by mouth daily before breakfast. 08/10/15   [provider]  REPATHA 140 MG/ML SOSY Inject 140 mg into the skin every 14 (fourteen) days. Every other Tuesday 08/15/22   [provider]    Family History Family History  Problem Relation Age of Onset   Breast cancer Maternal Aunt    Ovarian cancer Sister    Breast cancer Cousin     Social History Social History   Tobacco Use   Smoking status: Former    Current packs/day: 0.00    Average packs/day: 0.5 packs/day for 10.0 years (5.0 ttl pk-yrs)    Types: Cigarettes    Start date: 55    Quit date: 1995    Years since quitting: 30.8   Smokeless tobacco: Never  Vaping Use   Vaping status: Never Used  Substance Use Topics   Alcohol use: Yes    Comment: once per month   Drug use: No     Allergies   Percocet [oxycodone -acetaminophen ], Statins, and Sulfa antibiotics   Review of Systems Review of Systems   Physical Exam Triage Vital Signs ED Triage Vitals  Encounter Vitals Group     BP 04/21/24 1033 (!) 143/83     Girls Systolic BP Percentile --      Girls Diastolic BP Percentile --      Boys Systolic BP Percentile --      Boys Diastolic BP Percentile --       Pulse Rate 04/21/24 1033 76     Resp 04/21/24 1033 20     Temp 04/21/24 1033 99.1 F (37.3 C)     Temp Source 04/21/24 1033 Oral     SpO2 04/21/24 1033 98 %     Weight --      Height --      Head Circumference --      Peak Flow --      Pain Score 04/21/24 1036 0     Pain Loc --      Pain Education --      Exclude from Growth Chart --    No data found.  Updated Vital Signs BP (!) 143/83 (BP Location: Left Arm)   Pulse 76   Temp 99.1 F (37.3 C) (Oral)   Resp 20   SpO2 98%   Visual Acuity Right Eye Distance:   Left Eye Distance:   Bilateral Distance:    Right Eye Near:   Left Eye Near:    Bilateral Near:     Physical Exam Constitutional:      Appearance: Normal appearance.  HENT:     Head: Normocephalic.     Right Ear: Tympanic membrane, ear canal and external ear normal.     Left Ear: Tympanic membrane, ear canal and external ear normal.     Nose: Congestion present.     Mouth/Throat:     Mouth: Mucous membranes are moist.     Pharynx: Oropharynx is clear.  Eyes:     Extraocular Movements: Extraocular movements intact.  Cardiovascular:     Rate and Rhythm: Normal rate and regular rhythm.     Pulses: Normal pulses.     Heart sounds: Normal heart sounds.  Pulmonary:     Effort: Pulmonary effort is normal.     Breath sounds: Normal breath sounds.  Neurological:     Mental Status: She is alert and oriented to person, place, and time. Mental status is at  baseline.      UC Treatments / Results  Labs (all labs ordered are listed, but only abnormal results are displayed) Labs Reviewed - No data to display  EKG   Radiology No results found.  Procedures Procedures (including critical care time)  Medications Ordered in UC Medications - No data to display  Initial Impression / Assessment and Plan / UC Course  I have reviewed the triage vital signs and the nursing notes.  Pertinent labs & imaging results that were available during my care of the  patient were reviewed by me and considered in my medical decision making (see chart for details).  Acute nonrecurrent maxillary sinus  Patient is in no signs of distress nor toxic appearing.  Vital signs are stable.  Low suspicion for pneumonia, pneumothorax or bronchitis and therefore will defer imaging.  Symptoms consistent with a sinusitis present for at least 10 days, prescribed Augmentin for treatment and additionally prescribed Tessalon  and Promethazine  DM for management of coughing.May use additional over-the-counter medications as needed for supportive care.  May follow-up with urgent care as needed if symptoms persist or worsen.     Final Clinical Impressions(s) / UC Diagnoses   Final diagnoses:  Acute non-recurrent maxillary sinusitis     Discharge Instructions      Today you are being treated for sinus infection  Begin Augmentin twice daily for 7 days for treatment of bacteria  You may use Tessalon  pill every 8 hours as needed for cough and may use cough syrup at bedtime to allow for rest if needed    You can take Tylenol  and/or Ibuprofen as needed for fever reduction and pain relief.   For cough: honey 1/2 to 1 teaspoon (you can dilute the honey in water  or another fluid).  You can also use guaifenesin and dextromethorphan for cough. You can use a humidifier for chest congestion and cough.  If you don't have a humidifier, you can sit in the bathroom with the hot shower running.      For sore throat: try warm salt water  gargles, cepacol lozenges, throat spray, warm tea or water  with lemon/honey, popsicles or ice, or OTC cold relief medicine for throat discomfort.   For congestion: take a daily anti-histamine like Zyrtec, Claritin, and a oral decongestant, such as pseudoephedrine.  You can also use Flonase 1-2 sprays in each nostril daily.   It is important to stay hydrated: drink plenty of fluids (water , gatorade/powerade/pedialyte, juices, or teas) to keep your throat  moisturized and help further relieve irritation/discomfort.    ED Prescriptions     Medication Sig Dispense Auth. Provider   amoxicillin -clavulanate (AUGMENTIN) 875-125 MG tablet Take 1 tablet by mouth every 12 (twelve) hours. 14 tablet Yoana Staib R, NP   benzonatate  (TESSALON ) 100 MG capsule Take 1 capsule (100 mg total) by mouth every 8 (eight) hours. 21 capsule Yehonatan Grandison R, NP   promethazine -dextromethorphan (PROMETHAZINE -DM) 6.25-15 MG/5ML syrup Take 5 mLs by mouth at bedtime as needed. 118 mL Delta Deshmukh, Shelba SAUNDERS, NP      PDMP not reviewed this encounter.   Teresa Shelba SAUNDERS, NP 04/21/24 [id number]

## 2024-04-21 NOTE — Discharge Instructions (Signed)
 Today you are being treated for sinus infection  Begin Augmentin twice daily for 7 days for treatment of bacteria  You may use Tessalon  pill every 8 hours as needed for cough and may use cough syrup at bedtime to allow for rest if needed    You can take Tylenol  and/or Ibuprofen as needed for fever reduction and pain relief.   For cough: honey 1/2 to 1 teaspoon (you can dilute the honey in water  or another fluid).  You can also use guaifenesin and dextromethorphan for cough. You can use a humidifier for chest congestion and cough.  If you don't have a humidifier, you can sit in the bathroom with the hot shower running.      For sore throat: try warm salt water  gargles, cepacol lozenges, throat spray, warm tea or water  with lemon/honey, popsicles or ice, or OTC cold relief medicine for throat discomfort.   For congestion: take a daily anti-histamine like Zyrtec, Claritin, and a oral decongestant, such as pseudoephedrine.  You can also use Flonase 1-2 sprays in each nostril daily.   It is important to stay hydrated: drink plenty of fluids (water , gatorade/powerade/pedialyte, juices, or teas) to keep your throat moisturized and help further relieve irritation/discomfort.

## 2024-07-05 ENCOUNTER — Other Ambulatory Visit: Payer: Self-pay | Admitting: Family Medicine

## 2024-07-05 DIAGNOSIS — Z1231 Encounter for screening mammogram for malignant neoplasm of breast: Secondary | ICD-10-CM

## 2024-07-31 ENCOUNTER — Ambulatory Visit
Admission: RE | Admit: 2024-07-31 | Discharge: 2024-07-31 | Disposition: A | Source: Ambulatory Visit | Attending: Family Medicine | Admitting: Family Medicine

## 2024-07-31 DIAGNOSIS — Z1231 Encounter for screening mammogram for malignant neoplasm of breast: Secondary | ICD-10-CM
